# Patient Record
Sex: Female | Born: 1935 | Race: Black or African American | Hispanic: No | Marital: Married | Smoking: Former smoker
Health system: Southern US, Community
[De-identification: ages and names within clinical notes are randomized; demographics above are authoritative.]

## PROBLEM LIST (undated history)

## (undated) DIAGNOSIS — I428 Other cardiomyopathies: Principal | ICD-10-CM

## (undated) DIAGNOSIS — M1710 Unilateral primary osteoarthritis, unspecified knee: Secondary | ICD-10-CM

## (undated) DIAGNOSIS — C50919 Malignant neoplasm of unspecified site of unspecified female breast: Secondary | ICD-10-CM

## (undated) DIAGNOSIS — I513 Intracardiac thrombosis, not elsewhere classified: Secondary | ICD-10-CM

## (undated) DIAGNOSIS — F329 Major depressive disorder, single episode, unspecified: Secondary | ICD-10-CM

## (undated) DIAGNOSIS — I5020 Unspecified systolic (congestive) heart failure: Secondary | ICD-10-CM

## (undated) DIAGNOSIS — I4819 Other persistent atrial fibrillation: Secondary | ICD-10-CM

## (undated) DIAGNOSIS — Z9221 Personal history of antineoplastic chemotherapy: Secondary | ICD-10-CM

## (undated) DIAGNOSIS — D649 Anemia, unspecified: Secondary | ICD-10-CM

## (undated) DIAGNOSIS — C189 Malignant neoplasm of colon, unspecified: Secondary | ICD-10-CM

## (undated) DIAGNOSIS — Z950 Presence of cardiac pacemaker: Secondary | ICD-10-CM

## (undated) DIAGNOSIS — I10 Essential (primary) hypertension: Secondary | ICD-10-CM

## (undated) DIAGNOSIS — M179 Osteoarthritis of knee, unspecified: Secondary | ICD-10-CM

## (undated) DIAGNOSIS — I4891 Unspecified atrial fibrillation: Secondary | ICD-10-CM

## (undated) DIAGNOSIS — I38 Endocarditis, valve unspecified: Secondary | ICD-10-CM

## (undated) DIAGNOSIS — F32A Depression, unspecified: Secondary | ICD-10-CM

## (undated) DIAGNOSIS — IMO0002 Reserved for concepts with insufficient information to code with codable children: Secondary | ICD-10-CM

## (undated) DIAGNOSIS — Z8659 Personal history of other mental and behavioral disorders: Secondary | ICD-10-CM

## (undated) DIAGNOSIS — I82409 Acute embolism and thrombosis of unspecified deep veins of unspecified lower extremity: Secondary | ICD-10-CM

## (undated) DIAGNOSIS — J449 Chronic obstructive pulmonary disease, unspecified: Secondary | ICD-10-CM

## (undated) DIAGNOSIS — E785 Hyperlipidemia, unspecified: Secondary | ICD-10-CM

## (undated) HISTORY — DX: Malignant neoplasm of colon, unspecified: C18.9

## (undated) HISTORY — DX: Personal history of other mental and behavioral disorders: Z86.59

## (undated) HISTORY — DX: Major depressive disorder, single episode, unspecified: F32.9

## (undated) HISTORY — DX: Anemia, unspecified: D64.9

## (undated) HISTORY — DX: Endocarditis, valve unspecified: I38

## (undated) HISTORY — PX: ABDOMINAL HYSTERECTOMY: SHX81

## (undated) HISTORY — DX: Malignant neoplasm of unspecified site of unspecified female breast: C50.919

## (undated) HISTORY — DX: Other cardiomyopathies: I42.8

## (undated) HISTORY — PX: OOPHORECTOMY: SHX86

## (undated) HISTORY — DX: Unspecified systolic (congestive) heart failure: I50.20

## (undated) HISTORY — DX: Essential (primary) hypertension: I10

## (undated) HISTORY — DX: Reserved for concepts with insufficient information to code with codable children: IMO0002

## (undated) HISTORY — PX: REPLACEMENT TOTAL KNEE: SUR1224

## (undated) HISTORY — DX: Chronic obstructive pulmonary disease, unspecified: J44.9

## (undated) HISTORY — DX: Presence of cardiac pacemaker: Z95.0

## (undated) HISTORY — DX: Acute embolism and thrombosis of unspecified deep veins of unspecified lower extremity: I82.409

## (undated) HISTORY — DX: Unspecified atrial fibrillation: I48.91

## (undated) HISTORY — DX: Depression, unspecified: F32.A

## (undated) HISTORY — DX: Intracardiac thrombosis, not elsewhere classified: I51.3

## (undated) HISTORY — DX: Hyperlipidemia, unspecified: E78.5

## (undated) HISTORY — DX: Unilateral primary osteoarthritis, unspecified knee: M17.10

## (undated) HISTORY — DX: Osteoarthritis of knee, unspecified: M17.9

---

## 2003-11-28 ENCOUNTER — Ambulatory Visit: Payer: Self-pay | Admitting: Internal Medicine

## 2004-11-05 ENCOUNTER — Ambulatory Visit: Payer: Self-pay | Admitting: Family Medicine

## 2004-11-06 ENCOUNTER — Ambulatory Visit: Payer: Self-pay | Admitting: Family Medicine

## 2004-11-19 ENCOUNTER — Other Ambulatory Visit: Payer: Self-pay

## 2004-11-19 ENCOUNTER — Inpatient Hospital Stay: Payer: Self-pay | Admitting: Internal Medicine

## 2004-12-20 ENCOUNTER — Inpatient Hospital Stay: Payer: Self-pay | Admitting: Internal Medicine

## 2004-12-20 ENCOUNTER — Other Ambulatory Visit: Payer: Self-pay

## 2005-07-27 ENCOUNTER — Other Ambulatory Visit: Payer: Self-pay

## 2005-07-27 ENCOUNTER — Emergency Department: Payer: Self-pay | Admitting: Emergency Medicine

## 2005-10-20 ENCOUNTER — Emergency Department: Payer: Self-pay | Admitting: Emergency Medicine

## 2005-10-20 ENCOUNTER — Other Ambulatory Visit: Payer: Self-pay

## 2005-11-06 ENCOUNTER — Ambulatory Visit: Payer: Self-pay | Admitting: Internal Medicine

## 2005-11-11 ENCOUNTER — Ambulatory Visit: Payer: Self-pay | Admitting: Internal Medicine

## 2006-03-31 ENCOUNTER — Other Ambulatory Visit: Payer: Self-pay

## 2006-03-31 ENCOUNTER — Inpatient Hospital Stay: Payer: Self-pay | Admitting: Internal Medicine

## 2006-10-04 ENCOUNTER — Other Ambulatory Visit: Payer: Self-pay

## 2006-10-04 ENCOUNTER — Inpatient Hospital Stay: Payer: Self-pay | Admitting: Internal Medicine

## 2006-12-15 ENCOUNTER — Observation Stay: Payer: Self-pay | Admitting: Internal Medicine

## 2006-12-15 ENCOUNTER — Other Ambulatory Visit: Payer: Self-pay

## 2007-01-07 HISTORY — PX: PACEMAKER INSERTION: SHX728

## 2008-04-06 ENCOUNTER — Ambulatory Visit: Payer: Self-pay | Admitting: Internal Medicine

## 2008-04-25 ENCOUNTER — Ambulatory Visit: Payer: Self-pay | Admitting: Internal Medicine

## 2008-07-26 ENCOUNTER — Ambulatory Visit: Payer: Self-pay | Admitting: Gastroenterology

## 2008-07-31 ENCOUNTER — Ambulatory Visit: Payer: Self-pay | Admitting: Gastroenterology

## 2008-08-06 HISTORY — PX: LAPAROSCOPIC RIGHT COLON RESECTION: SHX1935

## 2008-08-18 ENCOUNTER — Ambulatory Visit: Payer: Self-pay | Admitting: Internal Medicine

## 2008-08-28 ENCOUNTER — Ambulatory Visit: Payer: Self-pay | Admitting: Surgery

## 2008-08-29 ENCOUNTER — Ambulatory Visit: Payer: Self-pay | Admitting: Surgery

## 2008-09-04 ENCOUNTER — Inpatient Hospital Stay: Payer: Self-pay | Admitting: Surgery

## 2008-10-20 ENCOUNTER — Ambulatory Visit: Payer: Self-pay | Admitting: Maternal and Fetal Medicine

## 2008-10-20 ENCOUNTER — Ambulatory Visit: Payer: Self-pay | Admitting: Oncology

## 2008-11-06 ENCOUNTER — Ambulatory Visit: Payer: Self-pay | Admitting: Maternal and Fetal Medicine

## 2008-11-06 ENCOUNTER — Ambulatory Visit: Payer: Self-pay | Admitting: Oncology

## 2009-04-06 ENCOUNTER — Ambulatory Visit: Payer: Self-pay | Admitting: Oncology

## 2009-05-04 ENCOUNTER — Ambulatory Visit: Payer: Self-pay | Admitting: Oncology

## 2009-05-06 ENCOUNTER — Ambulatory Visit: Payer: Self-pay | Admitting: Oncology

## 2009-06-04 ENCOUNTER — Ambulatory Visit: Payer: Self-pay | Admitting: Gastroenterology

## 2009-08-29 ENCOUNTER — Ambulatory Visit: Payer: Self-pay | Admitting: Internal Medicine

## 2011-03-24 ENCOUNTER — Encounter: Payer: Self-pay | Admitting: Internal Medicine

## 2011-03-24 ENCOUNTER — Ambulatory Visit (INDEPENDENT_AMBULATORY_CARE_PROVIDER_SITE_OTHER): Payer: Medicare Other | Admitting: Internal Medicine

## 2011-03-24 VITALS — BP 153/79 | HR 52 | Ht 65.0 in | Wt 168.0 lb

## 2011-03-24 DIAGNOSIS — I428 Other cardiomyopathies: Secondary | ICD-10-CM

## 2011-03-24 DIAGNOSIS — Z95810 Presence of automatic (implantable) cardiac defibrillator: Secondary | ICD-10-CM | POA: Insufficient documentation

## 2011-03-24 DIAGNOSIS — I509 Heart failure, unspecified: Secondary | ICD-10-CM

## 2011-03-24 DIAGNOSIS — I5023 Acute on chronic systolic (congestive) heart failure: Secondary | ICD-10-CM | POA: Insufficient documentation

## 2011-03-24 DIAGNOSIS — I5022 Chronic systolic (congestive) heart failure: Secondary | ICD-10-CM

## 2011-03-24 DIAGNOSIS — E785 Hyperlipidemia, unspecified: Secondary | ICD-10-CM

## 2011-03-24 LAB — ICD DEVICE OBSERVATION
DEVICE MODEL ICD: 241621
HV IMPEDENCE: 45 Ohm
RV LEAD AMPLITUDE: 13 mv
RV LEAD IMPEDENCE ICD: 590 Ohm
RV LEAD THRESHOLD: 0.5 V
TZON-0003FASTVT: 315.7 ms
TZON-0003SLOWVT: 375 ms
VENTRICULAR PACING ICD: 1 pct

## 2011-03-24 NOTE — Patient Instructions (Signed)
Follow up with Dr. Taylor in one year.  

## 2011-03-24 NOTE — Assessment & Plan Note (Signed)
Her device is working normally. Will plan to recheck in several months. 

## 2011-03-24 NOTE — Assessment & Plan Note (Signed)
Her symptoms are class 2. She will continue her current meds and maintain a low sodium diet. 

## 2011-03-24 NOTE — Progress Notes (Signed)
Patient ID: Julia Gordon, female   DOB: 20-Feb-1934, 77 y.o.   MRN: [id number] HPI Julia Gordon presents today to establish for ongoing management and treatment of her ICD in the setting of a non-ischemic cardiomyopathy. She has a longstanding non-ischemic cardiomyopathy and underwent ICD implant in 2009. She had previously been a patient of Dr. Glennis Brink. The patient has class 2 CHF symptoms. She denies c/p or peripheral edema. No syncope.   No Known Allergies   Current Outpatient Prescriptions  Medication Sig Dispense Refill  . aspirin 81 MG tablet Take 81 mg by mouth daily.      . furosemide (LASIX) 40 MG tablet Take one tablet by mouth every morning and for weight change as directed      . ipratropium-albuterol (DUONEB) 0.5-2.5 (3) MG/3ML SOLN Take by nebulization.      . isosorbide mononitrate (IMDUR) 30 MG 24 hr tablet Take 1 tablet by mouth Daily.      Marland Kitchen KLOR-CON M20 20 MEQ tablet Take 1 tablet by mouth Daily.      Marland Kitchen lisinopril (PRINIVIL,ZESTRIL) 10 MG tablet Take 1 tablet by mouth Daily.      Marland Kitchen lovastatin (MEVACOR) 40 MG tablet Take 2 tablets by mouth Daily.      . metoprolol (TOPROL-XL) 200 MG 24 hr tablet Take 1 tablet by mouth Daily.      . MULTIPLE VITAMIN PO Take by mouth daily.      Marland Kitchen spironolactone (ALDACTONE) 25 MG tablet Take 1 tablet by mouth Daily.         Past Medical History  Diagnosis Date  . Pacemaker 05/2007    [employer] ICD, Guidant lead, [facility] Dr Francena Hanly  . HTN (hypertension)   . Other primary cardiomyopathies   . Atrial fibrillation     history of  . LV (left ventricular) mural thrombus     history of, resolved (echo 04/09)  . HLD (hyperlipidemia)   . COPD (chronic obstructive pulmonary disease)   . Colon adenocarcinoma   . History of depression   . DJD (degenerative joint disease) of knee     right knee  . H/O hysterectomy with oophorectomy     for DUB    ROS:   All systems reviewed and negative except as noted in the HPI.   Past Surgical  History  Procedure Date  . Pacemaker insertion 2009  . Replacement total knee 1990's    right   . Laparoscopic right colon resection 08/10    Dr Michela Pitcher, for adenoca      Family History  Problem Relation Age of Onset  . Diabetes      brothers  . Hypertension Father   . Alzheimer's disease Mother   . Stroke Brother   . Heart failure Brother   . Breast cancer Sister   . Lupus Sister      History   Social History  . Marital Status: Married    Spouse Name: N/A    Number of Children: N/A  . Years of Education: N/A   Occupational History  . 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 Smoking status: Former Games developer  . Smokeless tobacco: Never Used   Comment: quit 2006  . Alcohol Use: No     former  . Drug Use: No  . Sexually Active: Not on file   Other Topics Concern  . Not on file   Social History Narrative  .  No narrative on file     BP 153/79  Pulse 52  Ht 5\' 5"  (1.651 m)  Wt 76.204 kg (168 lb)  BMI 27.96 kg/m2  Physical Exam:  Well appearing NAD HEENT: Unremarkable Neck:  No JVD, no thyromegally Lymphatics:  No adenopathy Back:  No CVA tenderness Lungs:  Clear HEART:  Regular rate rhythm, no murmurs, no rubs, no clicks Abd:  soft, positive bowel sounds, no organomegally, no rebound, no guarding Ext:  2 plus pulses, no edema, no cyanosis, no clubbing Skin:  No rashes no nodules Neuro:  CN II through XII intact, motor grossly intact  DEVICE  Normal device function.  See PaceArt for details.   Assess/Plan:

## 2011-05-02 ENCOUNTER — Encounter: Payer: Self-pay | Admitting: Internal Medicine

## 2011-06-26 ENCOUNTER — Encounter: Payer: BC Managed Care – PPO | Admitting: *Deleted

## 2011-07-01 ENCOUNTER — Encounter: Payer: Self-pay | Admitting: *Deleted

## 2011-09-03 ENCOUNTER — Encounter: Payer: Self-pay | Admitting: *Deleted

## 2012-01-01 ENCOUNTER — Encounter: Payer: Self-pay | Admitting: *Deleted

## 2012-01-09 ENCOUNTER — Encounter: Payer: Self-pay | Admitting: *Deleted

## 2012-01-21 ENCOUNTER — Ambulatory Visit: Payer: Self-pay | Admitting: Gastroenterology

## 2012-04-15 ENCOUNTER — Encounter: Payer: Self-pay | Admitting: Internal Medicine

## 2012-04-15 ENCOUNTER — Ambulatory Visit (INDEPENDENT_AMBULATORY_CARE_PROVIDER_SITE_OTHER): Payer: Medicare Other | Admitting: Internal Medicine

## 2012-04-15 VITALS — BP 118/78 | HR 44 | Ht 62.0 in | Wt 159.0 lb

## 2012-04-15 DIAGNOSIS — I428 Other cardiomyopathies: Secondary | ICD-10-CM

## 2012-04-15 DIAGNOSIS — Z95810 Presence of automatic (implantable) cardiac defibrillator: Secondary | ICD-10-CM

## 2012-04-15 DIAGNOSIS — I5022 Chronic systolic (congestive) heart failure: Secondary | ICD-10-CM

## 2012-04-15 DIAGNOSIS — I509 Heart failure, unspecified: Secondary | ICD-10-CM

## 2012-04-15 LAB — ICD DEVICE OBSERVATION
BRDY-0002RV: 40 {beats}/min
DEVICE MODEL ICD: 241621
RV LEAD AMPLITUDE: 11.7 mv
RV LEAD THRESHOLD: 0.5 V
TZON-0003FASTVT: 315.7 ms
VENTRICULAR PACING ICD: 3 pct

## 2012-04-15 NOTE — Assessment & Plan Note (Signed)
The patient has symptoms consistent with a low output state. This has been exacerbated by her bradycardia, secondary to extensive beta blocker therapy. I've recommended that the patient to reduce her dose of metoprolol from 200 mg daily down to 100 mg daily. I will see her back in several months. Ultimately, she may require atrial pacing. She will continue her other medications for the treatment of chronic systolic heart failure.

## 2012-04-15 NOTE — Patient Instructions (Addendum)
Your physician wants you to follow-up in: 3 months with Dr. Ladona Ridgel. You will receive a reminder letter in the mail two months in advance. If you don't receive a letter, please call our office to schedule the follow-up appointment.  Decrease Toprol from 200 mg daily to 100 mg daily

## 2012-04-15 NOTE — Assessment & Plan Note (Signed)
Her [employer] ICD is working normally. We'll plan to recheck in several months. 

## 2012-04-15 NOTE — Progress Notes (Signed)
HPI Julia Gordon returns today for followup. She is a very pleasant 78 year old woman with a nonischemic cardiomyopathy, and chronic systolic heart failure, status post ICD insertion in 2009. In the interim, she has had fatigue and weakness. She denies chest pain, ICD discharges, or syncope. She has occasional dizziness. Her energy level is reduced. No Known Allergies   Current Outpatient Prescriptions  Medication Sig Dispense Refill  . aspirin 81 MG tablet Take 81 mg by mouth daily.      . furosemide (LASIX) 40 MG tablet Take one tablet by mouth every morning and for weight change as directed      . ipratropium-albuterol (DUONEB) 0.5-2.5 (3) MG/3ML SOLN Take by nebulization.      . isosorbide mononitrate (IMDUR) 30 MG 24 hr tablet Take 1 tablet by mouth Daily.      Marland Kitchen KLOR-CON M20 20 MEQ tablet Take 1 tablet by mouth Daily.      Marland Kitchen lisinopril (PRINIVIL,ZESTRIL) 10 MG tablet Take 1 tablet by mouth Daily.      Marland Kitchen lovastatin (MEVACOR) 40 MG tablet Take 2 tablets by mouth Daily.      . metoprolol (TOPROL-XL) 200 MG 24 hr tablet Take 1 tablet by mouth Daily.      . MULTIPLE VITAMIN PO Take by mouth daily.      Marland Kitchen spironolactone (ALDACTONE) 25 MG tablet Take 1 tablet by mouth Daily.       No current facility-administered medications for this visit.     Past Medical History  Diagnosis Date  . Pacemaker 05/2007    [employer] ICD, Guidant lead, [facility] Dr Francena Hanly  . HTN (hypertension)   . Other primary cardiomyopathies   . Atrial fibrillation     history of  . LV (left ventricular) mural thrombus     history of, resolved (echo 04/09)  . HLD (hyperlipidemia)   . COPD (chronic obstructive pulmonary disease)   . Colon adenocarcinoma   . History of depression   . DJD (degenerative joint disease) of knee     right knee  . H/O hysterectomy with oophorectomy     for DUB    ROS:   All systems reviewed and negative except as noted in the HPI.   Past Surgical History  Procedure  Laterality Date  . Pacemaker insertion  2009  . Replacement total knee  1990's    right   . Laparoscopic right colon resection  08/10    Dr Michela Pitcher, for adenoca      Family History  Problem Relation Age of Onset  . Diabetes      brothers  . Hypertension Father   . Alzheimer's disease Mother   . Stroke Brother   . Heart failure Brother   . Breast cancer Sister   . Lupus Sister      History   Social History  . Marital Status: Married    Spouse Name: N/A    Number of Children: N/A  . Years of Education: N/A   Occupational History  . 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 Smoking status: Former Games developer  . Smokeless tobacco: Never Used     Comment: quit 2006  . Alcohol Use: No     Comment: former  . Drug Use: No  . Sexually Active: Not on file   Other Topics Concern  . Not on file   Social History Narrative  . No narrative on file  BP 118/78  Pulse 44  Ht 5\' 2"  (1.575 m)  Wt 159 lb (72.122 kg)  BMI 29.07 kg/m2  Physical Exam:  Well appearing 78 year old woman,NAD HEENT: Unremarkable Neck:  7 cm JVD, no thyromegally Back:  No CVA tenderness Lungs:  Clear with no wheezes, rales, or rhonchi. HEART:  Regular rate rhythm, no murmurs, no rubs, no clicks Abd:  soft, positive bowel sounds, no organomegally, no rebound, no guarding Ext:  2 plus pulses, no edema, no cyanosis, no clubbing Skin:  No rashes no nodules Neuro:  CN II through XII intact, motor grossly intact  EKG - marked sinus bradycardia with intermittent ventricular pacing.  DEVICE  Normal device function.  See PaceArt for details.   Assess/Plan:

## 2013-01-06 DIAGNOSIS — C50919 Malignant neoplasm of unspecified site of unspecified female breast: Secondary | ICD-10-CM

## 2013-01-06 HISTORY — DX: Malignant neoplasm of unspecified site of unspecified female breast: C50.919

## 2013-01-06 HISTORY — PX: MASTECTOMY: SHX3

## 2013-05-03 ENCOUNTER — Ambulatory Visit: Payer: Self-pay | Admitting: Primary Care

## 2013-05-10 ENCOUNTER — Ambulatory Visit: Payer: Self-pay | Admitting: Primary Care

## 2013-05-10 HISTORY — PX: BREAST BIOPSY: SHX20

## 2013-05-11 ENCOUNTER — Ambulatory Visit: Payer: Self-pay | Admitting: Oncology

## 2013-05-13 LAB — PATHOLOGY REPORT

## 2013-05-17 ENCOUNTER — Ambulatory Visit: Payer: Self-pay | Admitting: Oncology

## 2013-05-17 LAB — COMPREHENSIVE METABOLIC PANEL
ALK PHOS: 46 U/L
ANION GAP: 7 (ref 7–16)
Albumin: 3.5 g/dL (ref 3.4–5.0)
BUN: 10 mg/dL (ref 7–18)
Bilirubin,Total: 0.3 mg/dL (ref 0.2–1.0)
CREATININE: 1.02 mg/dL (ref 0.60–1.30)
Calcium, Total: 9.4 mg/dL (ref 8.5–10.1)
Chloride: 103 mmol/L (ref 98–107)
Co2: 29 mmol/L (ref 21–32)
EGFR (African American): >60
EGFR (Non-African Amer.): 53 — ABNORMAL LOW
Glucose: 86 mg/dL (ref 65–99)
Osmolality: 276 (ref 275–301)
POTASSIUM: 3.8 mmol/L (ref 3.5–5.1)
SGOT(AST): 17 U/L (ref 15–37)
SGPT (ALT): 13 U/L (ref 12–78)
Sodium: 139 mmol/L (ref 136–145)
Total Protein: 7.4 g/dL (ref 6.4–8.2)

## 2013-05-17 LAB — CBC CANCER CENTER
Basophil #: 0.1 x10 3/mm (ref 0.0–0.1)
Basophil %: 1 %
Eosinophil #: 0 x10 3/mm (ref 0.0–0.7)
Eosinophil %: 0.8 %
HCT: 39.6 % (ref 35.0–47.0)
HGB: 13.5 g/dL (ref 12.0–16.0)
Lymphocyte #: 1.6 x10 3/mm (ref 1.0–3.6)
Lymphocyte %: 29.6 %
MCH: 33.1 pg (ref 26.0–34.0)
MCHC: 34.1 g/dL (ref 32.0–36.0)
MCV: 97 fL (ref 80–100)
MONO ABS: 0.5 x10 3/mm (ref 0.2–0.9)
Monocyte %: 8.4 %
NEUTROS ABS: 3.3 x10 3/mm (ref 1.4–6.5)
Neutrophil %: 60.2 %
PLATELETS: 154 x10 3/mm (ref 150–440)
RBC: 4.07 10*6/uL (ref 3.80–5.20)
RDW: 13.9 % (ref 11.5–14.5)
WBC: 5.5 x10 3/mm (ref 3.6–11.0)

## 2013-05-18 LAB — CANCER ANTIGEN 27.29: CA 27.29: 23.2 U/mL (ref 0.0–38.6)

## 2013-06-06 ENCOUNTER — Ambulatory Visit: Payer: Self-pay | Admitting: Oncology

## 2013-06-07 LAB — COMPREHENSIVE METABOLIC PANEL
ALBUMIN: 3.6 g/dL (ref 3.4–5.0)
ALK PHOS: 43 U/L — AB
ALT: 16 U/L (ref 12–78)
Anion Gap: 5 — ABNORMAL LOW (ref 7–16)
BILIRUBIN TOTAL: 0.5 mg/dL (ref 0.2–1.0)
BUN: 17 mg/dL (ref 7–18)
CALCIUM: 9.7 mg/dL (ref 8.5–10.1)
CHLORIDE: 102 mmol/L (ref 98–107)
Co2: 32 mmol/L (ref 21–32)
Creatinine: 1.1 mg/dL (ref 0.60–1.30)
EGFR (African American): 56 — ABNORMAL LOW
EGFR (Non-African Amer.): 49 — ABNORMAL LOW
Glucose: 129 mg/dL — ABNORMAL HIGH (ref 65–99)
Osmolality: 281 (ref 275–301)
Potassium: 4.4 mmol/L (ref 3.5–5.1)
SGOT(AST): 16 U/L (ref 15–37)
Sodium: 139 mmol/L (ref 136–145)
Total Protein: 7.5 g/dL (ref 6.4–8.2)

## 2013-06-07 LAB — CBC CANCER CENTER
BASOS ABS: 0.1 x10 3/mm (ref 0.0–0.1)
Basophil %: 1.3 %
EOS PCT: 1 %
Eosinophil #: 0.1 x10 3/mm (ref 0.0–0.7)
HCT: 40.3 % (ref 35.0–47.0)
HGB: 13.8 g/dL (ref 12.0–16.0)
LYMPHS ABS: 1.6 x10 3/mm (ref 1.0–3.6)
Lymphocyte %: 30.7 %
MCH: 33.4 pg (ref 26.0–34.0)
MCHC: 34.2 g/dL (ref 32.0–36.0)
MCV: 98 fL (ref 80–100)
MONO ABS: 0.5 x10 3/mm (ref 0.2–0.9)
MONOS PCT: 9.5 %
NEUTROS ABS: 3 x10 3/mm (ref 1.4–6.5)
NEUTROS PCT: 57.5 %
PLATELETS: 151 x10 3/mm (ref 150–440)
RBC: 4.11 10*6/uL (ref 3.80–5.20)
RDW: 14.1 % (ref 11.5–14.5)
WBC: 5.2 x10 3/mm (ref 3.6–11.0)

## 2013-06-14 LAB — CBC CANCER CENTER
BASOS ABS: 0 x10 3/mm (ref 0.0–0.1)
Basophil %: 0.1 %
Eosinophil #: 0 x10 3/mm (ref 0.0–0.7)
Eosinophil %: 1 %
HCT: 37.5 % (ref 35.0–47.0)
HGB: 12.7 g/dL (ref 12.0–16.0)
Lymphocyte #: 0.6 x10 3/mm — ABNORMAL LOW (ref 1.0–3.6)
Lymphocyte %: 59.7 %
MCH: 32.6 pg (ref 26.0–34.0)
MCHC: 33.8 g/dL (ref 32.0–36.0)
MCV: 97 fL (ref 80–100)
MONOS PCT: 4.8 %
Monocyte #: 0 x10 3/mm — ABNORMAL LOW (ref 0.2–0.9)
Neutrophil #: 0.3 x10 3/mm — ABNORMAL LOW (ref 1.4–6.5)
Neutrophil %: 34.4 %
Platelet: 118 x10 3/mm — ABNORMAL LOW (ref 150–440)
RBC: 3.88 10*6/uL (ref 3.80–5.20)
RDW: 13.6 % (ref 11.5–14.5)
WBC: 1 x10 3/mm — CL (ref 3.6–11.0)

## 2013-06-14 LAB — COMPREHENSIVE METABOLIC PANEL
ALBUMIN: 3.4 g/dL (ref 3.4–5.0)
ALK PHOS: 38 U/L — AB
AST: 16 U/L (ref 15–37)
Anion Gap: 7 (ref 7–16)
BUN: 24 mg/dL — AB (ref 7–18)
Bilirubin,Total: 0.7 mg/dL (ref 0.2–1.0)
CHLORIDE: 101 mmol/L (ref 98–107)
Calcium, Total: 9 mg/dL (ref 8.5–10.1)
Co2: 29 mmol/L (ref 21–32)
Creatinine: 1.02 mg/dL (ref 0.60–1.30)
EGFR (Non-African Amer.): 53 — ABNORMAL LOW
Glucose: 107 mg/dL — ABNORMAL HIGH (ref 65–99)
Osmolality: 278 (ref 275–301)
POTASSIUM: 3.8 mmol/L (ref 3.5–5.1)
SGPT (ALT): 18 U/L (ref 12–78)
Sodium: 137 mmol/L (ref 136–145)
Total Protein: 6.9 g/dL (ref 6.4–8.2)

## 2013-06-21 ENCOUNTER — Ambulatory Visit: Payer: Self-pay | Admitting: Vascular Surgery

## 2013-06-28 LAB — CBC CANCER CENTER
BASOS ABS: 0.1 x10 3/mm (ref 0.0–0.1)
Basophil %: 1 %
Eosinophil #: 0 x10 3/mm (ref 0.0–0.7)
Eosinophil %: 0.1 %
HCT: 33.4 % — AB (ref 35.0–47.0)
HGB: 11.5 g/dL — ABNORMAL LOW (ref 12.0–16.0)
LYMPHS ABS: 1.3 x10 3/mm (ref 1.0–3.6)
Lymphocyte %: 21.4 %
MCH: 32.9 pg (ref 26.0–34.0)
MCHC: 34.4 g/dL (ref 32.0–36.0)
MCV: 96 fL (ref 80–100)
Monocyte #: 0.6 x10 3/mm (ref 0.2–0.9)
Monocyte %: 9.9 %
NEUTROS PCT: 67.6 %
Neutrophil #: 4.1 x10 3/mm (ref 1.4–6.5)
Platelet: 138 x10 3/mm — ABNORMAL LOW (ref 150–440)
RBC: 3.5 10*6/uL — ABNORMAL LOW (ref 3.80–5.20)
RDW: 13.9 % (ref 11.5–14.5)
WBC: 6 x10 3/mm (ref 3.6–11.0)

## 2013-06-28 LAB — COMPREHENSIVE METABOLIC PANEL
ALK PHOS: 36 U/L — AB
ALT: 18 U/L (ref 12–78)
ANION GAP: 3 — AB (ref 7–16)
AST: 20 U/L (ref 15–37)
Albumin: 3.1 g/dL — ABNORMAL LOW (ref 3.4–5.0)
BUN: 15 mg/dL (ref 7–18)
Bilirubin,Total: 0.3 mg/dL (ref 0.2–1.0)
CO2: 30 mmol/L (ref 21–32)
CREATININE: 0.99 mg/dL (ref 0.60–1.30)
Calcium, Total: 9.1 mg/dL (ref 8.5–10.1)
Chloride: 105 mmol/L (ref 98–107)
EGFR (African American): >60
EGFR (Non-African Amer.): 55 — ABNORMAL LOW
Glucose: 106 mg/dL — ABNORMAL HIGH (ref 65–99)
Osmolality: 277 (ref 275–301)
POTASSIUM: 4 mmol/L (ref 3.5–5.1)
Sodium: 138 mmol/L (ref 136–145)
Total Protein: 6.5 g/dL (ref 6.4–8.2)

## 2013-07-05 LAB — COMPREHENSIVE METABOLIC PANEL
ALBUMIN: 3.2 g/dL — AB (ref 3.4–5.0)
ALT: 20 U/L (ref 12–78)
AST: 20 U/L (ref 15–37)
Alkaline Phosphatase: 38 U/L — ABNORMAL LOW
Anion Gap: 6 — ABNORMAL LOW (ref 7–16)
BILIRUBIN TOTAL: 0.6 mg/dL (ref 0.2–1.0)
BUN: 23 mg/dL — ABNORMAL HIGH (ref 7–18)
CHLORIDE: 103 mmol/L (ref 98–107)
Calcium, Total: 9 mg/dL (ref 8.5–10.1)
Co2: 30 mmol/L (ref 21–32)
Creatinine: 1.09 mg/dL (ref 0.60–1.30)
EGFR (African American): 57 — ABNORMAL LOW
GFR CALC NON AF AMER: 49 — AB
Glucose: 99 mg/dL (ref 65–99)
Osmolality: 281 (ref 275–301)
Potassium: 4.5 mmol/L (ref 3.5–5.1)
Sodium: 139 mmol/L (ref 136–145)
TOTAL PROTEIN: 6.8 g/dL (ref 6.4–8.2)

## 2013-07-05 LAB — CBC CANCER CENTER
BASOS ABS: 0.1 x10 3/mm (ref 0.0–0.1)
BASOS PCT: 3.3 %
EOS PCT: 1 %
Eosinophil #: 0 x10 3/mm (ref 0.0–0.7)
HCT: 31.5 % — ABNORMAL LOW (ref 35.0–47.0)
HGB: 10.9 g/dL — AB (ref 12.0–16.0)
Lymphocyte #: 0.8 x10 3/mm — ABNORMAL LOW (ref 1.0–3.6)
Lymphocyte %: 49.6 %
MCH: 33 pg (ref 26.0–34.0)
MCHC: 34.7 g/dL (ref 32.0–36.0)
MCV: 95 fL (ref 80–100)
Monocyte #: 0.1 x10 3/mm — ABNORMAL LOW (ref 0.2–0.9)
Monocyte %: 5.6 %
NEUTROS ABS: 0.6 x10 3/mm — AB (ref 1.4–6.5)
NEUTROS PCT: 40.5 %
Platelet: 144 x10 3/mm — ABNORMAL LOW (ref 150–440)
RBC: 3.32 10*6/uL — AB (ref 3.80–5.20)
RDW: 14.1 % (ref 11.5–14.5)
WBC: 1.6 x10 3/mm — CL (ref 3.6–11.0)

## 2013-07-06 ENCOUNTER — Ambulatory Visit: Payer: Self-pay | Admitting: Oncology

## 2013-07-13 ENCOUNTER — Encounter: Payer: Self-pay | Admitting: *Deleted

## 2013-07-19 LAB — CBC CANCER CENTER
BASOS PCT: 0.5 %
Basophil #: 0 x10 3/mm (ref 0.0–0.1)
EOS ABS: 0 x10 3/mm (ref 0.0–0.7)
EOS PCT: 0.1 %
HCT: 30.2 % — AB (ref 35.0–47.0)
HGB: 10.1 g/dL — ABNORMAL LOW (ref 12.0–16.0)
LYMPHS ABS: 0.9 x10 3/mm — AB (ref 1.0–3.6)
Lymphocyte %: 11.8 %
MCH: 31.9 pg (ref 26.0–34.0)
MCHC: 33.4 g/dL (ref 32.0–36.0)
MCV: 96 fL (ref 80–100)
Monocyte #: 0.6 x10 3/mm (ref 0.2–0.9)
Monocyte %: 7.4 %
NEUTROS PCT: 80.2 %
Neutrophil #: 6.4 x10 3/mm (ref 1.4–6.5)
PLATELETS: 163 x10 3/mm (ref 150–440)
RBC: 3.16 10*6/uL — ABNORMAL LOW (ref 3.80–5.20)
RDW: 15.4 % — AB (ref 11.5–14.5)
WBC: 8 x10 3/mm (ref 3.6–11.0)

## 2013-07-19 LAB — COMPREHENSIVE METABOLIC PANEL
ALBUMIN: 3.2 g/dL — AB (ref 3.4–5.0)
ALT: 16 U/L (ref 12–78)
ANION GAP: 9 (ref 7–16)
AST: 19 U/L (ref 15–37)
Alkaline Phosphatase: 42 U/L — ABNORMAL LOW
BILIRUBIN TOTAL: 0.4 mg/dL (ref 0.2–1.0)
BUN: 16 mg/dL (ref 7–18)
CHLORIDE: 106 mmol/L (ref 98–107)
Calcium, Total: 8.7 mg/dL (ref 8.5–10.1)
Co2: 27 mmol/L (ref 21–32)
Creatinine: 1.05 mg/dL (ref 0.60–1.30)
EGFR (Non-African Amer.): 52 — ABNORMAL LOW
GFR CALC AF AMER: 60 — AB
Glucose: 115 mg/dL — ABNORMAL HIGH (ref 65–99)
OSMOLALITY: 285 (ref 275–301)
Potassium: 4 mmol/L (ref 3.5–5.1)
SODIUM: 142 mmol/L (ref 136–145)
TOTAL PROTEIN: 7 g/dL (ref 6.4–8.2)

## 2013-07-26 LAB — COMPREHENSIVE METABOLIC PANEL
ALBUMIN: 3 g/dL — AB (ref 3.4–5.0)
Alkaline Phosphatase: 42 U/L — ABNORMAL LOW
Anion Gap: 7 (ref 7–16)
BILIRUBIN TOTAL: 0.6 mg/dL (ref 0.2–1.0)
BUN: 16 mg/dL (ref 7–18)
CHLORIDE: 101 mmol/L (ref 98–107)
CREATININE: 0.98 mg/dL (ref 0.60–1.30)
Calcium, Total: 8.6 mg/dL (ref 8.5–10.1)
Co2: 28 mmol/L (ref 21–32)
EGFR (Non-African Amer.): 56 — ABNORMAL LOW
Glucose: 98 mg/dL (ref 65–99)
OSMOLALITY: 273 (ref 275–301)
Potassium: 3.9 mmol/L (ref 3.5–5.1)
SGOT(AST): 18 U/L (ref 15–37)
SGPT (ALT): 13 U/L (ref 12–78)
SODIUM: 136 mmol/L (ref 136–145)
TOTAL PROTEIN: 6.7 g/dL (ref 6.4–8.2)

## 2013-07-26 LAB — CBC CANCER CENTER
Basophil #: 0 x10 3/mm (ref 0.0–0.1)
Basophil %: 0.4 %
Eosinophil #: 0 x10 3/mm (ref 0.0–0.7)
Eosinophil %: 0.6 %
HCT: 26.2 % — AB (ref 35.0–47.0)
HGB: 8.9 g/dL — AB (ref 12.0–16.0)
LYMPHS ABS: 0.4 x10 3/mm — AB (ref 1.0–3.6)
LYMPHS PCT: 43.9 %
MCH: 31.8 pg (ref 26.0–34.0)
MCHC: 33.8 g/dL (ref 32.0–36.0)
MCV: 94 fL (ref 80–100)
MONOS PCT: 10.4 %
Monocyte #: 0.1 x10 3/mm — ABNORMAL LOW (ref 0.2–0.9)
NEUTROS PCT: 44.7 %
Neutrophil #: 0.4 x10 3/mm — ABNORMAL LOW (ref 1.4–6.5)
PLATELETS: 139 x10 3/mm — AB (ref 150–440)
RBC: 2.78 10*6/uL — AB (ref 3.80–5.20)
RDW: 15.6 % — ABNORMAL HIGH (ref 11.5–14.5)
WBC: 0.9 x10 3/mm — CL (ref 3.6–11.0)

## 2013-08-06 ENCOUNTER — Ambulatory Visit: Payer: Self-pay | Admitting: Oncology

## 2013-08-09 LAB — CBC CANCER CENTER
Basophil #: 0 x10 3/mm (ref 0.0–0.1)
Basophil %: 0.7 %
EOS PCT: 0.1 %
Eosinophil #: 0 x10 3/mm (ref 0.0–0.7)
HCT: 28.3 % — ABNORMAL LOW (ref 35.0–47.0)
HGB: 9.4 g/dL — ABNORMAL LOW (ref 12.0–16.0)
LYMPHS ABS: 0.8 x10 3/mm — AB (ref 1.0–3.6)
LYMPHS PCT: 10.6 %
MCH: 31.6 pg (ref 26.0–34.0)
MCHC: 33.4 g/dL (ref 32.0–36.0)
MCV: 95 fL (ref 80–100)
Monocyte #: 0.8 x10 3/mm (ref 0.2–0.9)
Monocyte %: 10.3 %
NEUTROS PCT: 78.3 %
Neutrophil #: 5.9 x10 3/mm (ref 1.4–6.5)
Platelet: 217 x10 3/mm (ref 150–440)
RBC: 2.98 10*6/uL — AB (ref 3.80–5.20)
RDW: 17.3 % — AB (ref 11.5–14.5)
WBC: 7.5 x10 3/mm (ref 3.6–11.0)

## 2013-08-09 LAB — COMPREHENSIVE METABOLIC PANEL
ALT: 15 U/L
Albumin: 3 g/dL — ABNORMAL LOW (ref 3.4–5.0)
Alkaline Phosphatase: 40 U/L — ABNORMAL LOW
Anion Gap: 6 — ABNORMAL LOW (ref 7–16)
BUN: 20 mg/dL — AB (ref 7–18)
Bilirubin,Total: 0.3 mg/dL (ref 0.2–1.0)
CALCIUM: 8.7 mg/dL (ref 8.5–10.1)
CREATININE: 1.18 mg/dL (ref 0.60–1.30)
Chloride: 107 mmol/L (ref 98–107)
Co2: 28 mmol/L (ref 21–32)
EGFR (Non-African Amer.): 44 — ABNORMAL LOW
GFR CALC AF AMER: 52 — AB
Glucose: 79 mg/dL (ref 65–99)
Osmolality: 283 (ref 275–301)
Potassium: 4.1 mmol/L (ref 3.5–5.1)
SGOT(AST): 19 U/L (ref 15–37)
Sodium: 141 mmol/L (ref 136–145)
TOTAL PROTEIN: 6.8 g/dL (ref 6.4–8.2)

## 2013-09-06 ENCOUNTER — Ambulatory Visit: Payer: Self-pay | Admitting: Oncology

## 2013-09-27 ENCOUNTER — Ambulatory Visit: Payer: Self-pay | Admitting: Surgery

## 2013-09-27 LAB — BASIC METABOLIC PANEL
Anion Gap: 3 — ABNORMAL LOW (ref 7–16)
BUN: 17 mg/dL (ref 7–18)
CO2: 28 mmol/L (ref 21–32)
Calcium, Total: 8.8 mg/dL (ref 8.5–10.1)
Chloride: 110 mmol/L — ABNORMAL HIGH (ref 98–107)
Creatinine: 0.95 mg/dL (ref 0.60–1.30)
EGFR (African American): >60
EGFR (Non-African Amer.): 58 — ABNORMAL LOW
Glucose: 97 mg/dL (ref 65–99)
Osmolality: 283 (ref 275–301)
Potassium: 4.2 mmol/L (ref 3.5–5.1)
SODIUM: 141 mmol/L (ref 136–145)

## 2013-09-27 LAB — CBC WITH DIFFERENTIAL/PLATELET
Basophil #: 0.1 10*3/uL (ref 0.0–0.1)
Basophil %: 1 %
EOS PCT: 3.9 %
Eosinophil #: 0.2 10*3/uL (ref 0.0–0.7)
HCT: 35.9 % (ref 35.0–47.0)
HGB: 11.7 g/dL — AB (ref 12.0–16.0)
LYMPHS ABS: 1 10*3/uL (ref 1.0–3.6)
Lymphocyte %: 19.6 %
MCH: 31.4 pg (ref 26.0–34.0)
MCHC: 32.5 g/dL (ref 32.0–36.0)
MCV: 97 fL (ref 80–100)
MONO ABS: 0.5 x10 3/mm (ref 0.2–0.9)
Monocyte %: 8.9 %
NEUTROS ABS: 3.6 10*3/uL (ref 1.4–6.5)
Neutrophil %: 66.6 %
Platelet: 166 10*3/uL (ref 150–440)
RBC: 3.72 10*6/uL — AB (ref 3.80–5.20)
RDW: 18.7 % — ABNORMAL HIGH (ref 11.5–14.5)
WBC: 5.3 10*3/uL (ref 3.6–11.0)

## 2013-10-04 ENCOUNTER — Ambulatory Visit: Payer: Self-pay | Admitting: Surgery

## 2013-10-06 ENCOUNTER — Ambulatory Visit: Payer: Self-pay | Admitting: Oncology

## 2013-10-06 LAB — CBC CANCER CENTER
BASOS PCT: 1.2 %
Basophil #: 0.1 x10 3/mm (ref 0.0–0.1)
EOS ABS: 0.2 x10 3/mm (ref 0.0–0.7)
Eosinophil %: 3.6 %
HCT: 36.6 % (ref 35.0–47.0)
HGB: 12 g/dL (ref 12.0–16.0)
LYMPHS PCT: 22 %
Lymphocyte #: 1.1 x10 3/mm (ref 1.0–3.6)
MCH: 31.4 pg (ref 26.0–34.0)
MCHC: 32.8 g/dL (ref 32.0–36.0)
MCV: 96 fL (ref 80–100)
MONO ABS: 0.5 x10 3/mm (ref 0.2–0.9)
Monocyte %: 10.6 %
NEUTROS ABS: 3 x10 3/mm (ref 1.4–6.5)
Neutrophil %: 62.6 %
Platelet: 164 x10 3/mm (ref 150–440)
RBC: 3.83 10*6/uL (ref 3.80–5.20)
RDW: 18.5 % — ABNORMAL HIGH (ref 11.5–14.5)
WBC: 4.8 x10 3/mm (ref 3.6–11.0)

## 2013-10-06 LAB — COMPREHENSIVE METABOLIC PANEL
ALK PHOS: 55 U/L
ALT: 15 U/L
AST: 23 U/L (ref 15–37)
Albumin: 3.7 g/dL (ref 3.4–5.0)
Anion Gap: 4 — ABNORMAL LOW (ref 7–16)
BUN: 15 mg/dL (ref 7–18)
Bilirubin,Total: 0.4 mg/dL (ref 0.2–1.0)
CHLORIDE: 106 mmol/L (ref 98–107)
Calcium, Total: 9.1 mg/dL (ref 8.5–10.1)
Co2: 31 mmol/L (ref 21–32)
Creatinine: 1.15 mg/dL (ref 0.60–1.30)
EGFR (African American): 59 — ABNORMAL LOW
GFR CALC NON AF AMER: 49 — AB
Glucose: 88 mg/dL (ref 65–99)
OSMOLALITY: 282 (ref 275–301)
Potassium: 4.4 mmol/L (ref 3.5–5.1)
Sodium: 141 mmol/L (ref 136–145)
Total Protein: 7.5 g/dL (ref 6.4–8.2)

## 2013-10-07 LAB — PATHOLOGY REPORT

## 2013-11-06 ENCOUNTER — Ambulatory Visit: Payer: Self-pay | Admitting: Oncology

## 2013-12-08 ENCOUNTER — Ambulatory Visit: Payer: Self-pay | Admitting: Oncology

## 2013-12-13 ENCOUNTER — Ambulatory Visit: Payer: Self-pay | Admitting: Surgery

## 2013-12-13 LAB — BASIC METABOLIC PANEL
Anion Gap: 4 — ABNORMAL LOW (ref 7–16)
BUN: 16 mg/dL (ref 7–18)
CO2: 28 mmol/L (ref 21–32)
Calcium, Total: 9.7 mg/dL (ref 8.5–10.1)
Chloride: 104 mmol/L (ref 98–107)
Creatinine: 1.01 mg/dL (ref 0.60–1.30)
GFR CALC NON AF AMER: 56 — AB
Glucose: 100 mg/dL — ABNORMAL HIGH (ref 65–99)
OSMOLALITY: 273 (ref 275–301)
POTASSIUM: 3.8 mmol/L (ref 3.5–5.1)
SODIUM: 136 mmol/L (ref 136–145)

## 2013-12-13 LAB — CBC WITH DIFFERENTIAL/PLATELET
Basophil #: 0.1 10*3/uL (ref 0.0–0.1)
Basophil %: 1 %
EOS PCT: 1.5 %
Eosinophil #: 0.1 10*3/uL (ref 0.0–0.7)
HCT: 38.4 % (ref 35.0–47.0)
HGB: 12.7 g/dL (ref 12.0–16.0)
Lymphocyte #: 1.2 10*3/uL (ref 1.0–3.6)
Lymphocyte %: 20.7 %
MCH: 31.5 pg (ref 26.0–34.0)
MCHC: 33.1 g/dL (ref 32.0–36.0)
MCV: 95 fL (ref 80–100)
Monocyte #: 0.5 x10 3/mm (ref 0.2–0.9)
Monocyte %: 8.3 %
Neutrophil #: 4.1 10*3/uL (ref 1.4–6.5)
Neutrophil %: 68.5 %
Platelet: 133 10*3/uL — ABNORMAL LOW (ref 150–440)
RBC: 4.04 10*6/uL (ref 3.80–5.20)
RDW: 15.7 % — ABNORMAL HIGH (ref 11.5–14.5)
WBC: 6 10*3/uL (ref 3.6–11.0)

## 2013-12-16 ENCOUNTER — Emergency Department: Payer: Self-pay | Admitting: Emergency Medicine

## 2014-01-06 ENCOUNTER — Ambulatory Visit: Payer: Self-pay | Admitting: Oncology

## 2014-02-06 ENCOUNTER — Ambulatory Visit: Payer: Self-pay | Admitting: Oncology

## 2014-02-06 ENCOUNTER — Ambulatory Visit: Payer: Self-pay | Admitting: Internal Medicine

## 2014-04-13 ENCOUNTER — Ambulatory Visit
Admit: 2014-04-13 | Disposition: A | Discharge status: Home / Self Care | Payer: Self-pay | Attending: Oncology | Admitting: Oncology

## 2014-04-17 ENCOUNTER — Ambulatory Visit
Admit: 2014-04-17 | Disposition: A | Discharge status: Home / Self Care | Payer: Self-pay | Attending: Oncology | Admitting: Oncology

## 2014-04-18 LAB — CANCER ANTIGEN 27.29: CA 27.29: 21.4 U/mL (ref 0.0–38.6)

## 2014-04-29 NOTE — Op Note (Signed)
PATIENT NAME:  Julia Gordon, Julia Gordon MR#:  [id number] DATE OF BIRTH:  1934/12/09  DATE OF PROCEDURE:  12/13/2013  PREOPERATIVE DIAGNOSIS: Left breast carcinoma.   POSTOPERATIVE DIAGNOSIS: Left breast carcinoma.   OPERATION: Left modified radical mastectomy.   SURGEON: Rodena Goldmann III, MD.    ANESTHESIA: General.   OPERATIVE PROCEDURE: With the patient in supine position after the induction of appropriate general anesthesia, the patient's left chest was prepped with ChloraPrep and draped with sterile towels. An elliptical incision was made around the nipple and carried down through the subcutaneous tissue with Bovie electrocautery. Tacking sutures for traction with 3-0 silk were placed in both flaps. The superior flap was created down to the third intercostal space avoiding the defibrillator on that side. The incision was taken down to the chest wall. Inferior flap was taken down just below the inframammary fold using Bovie electrocautery. Several of the larger blood vessels were suture ligated with 3-0 silk. The breast was then swept off the chest wall from medial to lateral extending from the sternum to the latissimus dorsi muscle. The axillary contents were then entered, swept off the axillary vein, and the harmonic scalpel used to provide hemostasis. The specimen was passed off the table for permanent pathology. The area was then irrigated. In the inferior flap, 2 small incisions were made and 10 mm flat Jackson-Pratt drains were inserted, one to the superior flap and one to the axilla. Drains were secured with 3-0 nylon. The skin was then reapproximated using vertical mattress suture of 4-0 nylon. A compressive dressing was applied. The patient was returned to the recovery room, having tolerated the procedure well. Sponge and instrument counts were correct x 2 in the Operating Room.    ____________________________ Micheline Maze, MD rle:at D: 12/13/2013 09:39:13 ET T: 12/13/2013 09:59:41  ET JOB#: [id number]  cc: Rodena Goldmann III, MD, <Dictator> Ellamae Sia, MD Rodena Goldmann MD ELECTRONICALLY SIGNED 12/13/2013 18:30

## 2014-04-29 NOTE — Op Note (Signed)
PATIENT NAME:  Julia Gordon, VESSEY MR#:  [id number] DATE OF BIRTH:  [birth year]  DATE OF PROCEDURE:  10/04/2013  PREOPERATIVE DIAGNOSIS: Left breast carcinoma.   POSTOPERATIVE DIAGNOSIS: Left breast carcinoma.  OPERATION: Left partial mastectomy.   ANESTHESIA: General.   SURGEON: Rodena Goldmann, MD   OPERATIVE PROCEDURE: With the patient in the supine position after induction of appropriate general anesthesia, the patient's left breast was prepped with ChloraPrep and draped with sterile towels. An elliptical incision was made around the palpable lesion identified by the wire and carried down through the subcutaneous tissue with Bovie electrocautery. A large generous portion of the inferior inner and outer quadrants was removed. The specimen was taken off at the chest wall. The specimen was tagged with markers and sent to the mammography department for specimen mammography, which did reveal the presence of the lesion. The area was copiously irrigated. A 7 mm flat Jackson-Pratt drain was inserted through the inframammary fold into the base of the incision. The skin incision was closed with vertical mattress sutures of 4-0 nylon. A drain was secured with 3-0 nylon. Sterile dressings were applied. The patient was returned to the recovery room, having tolerated the procedure well.  Sponge and instrument counts were correct x 2 in the operating room.   ____________________________ Rodena Goldmann III, MD rle:TT D: 10/04/2013 14:50:16 ET T: 10/04/2013 19:47:36 ET JOB#: [id number]  cc: Micheline Maze, MD, <Dictator> Rodena Goldmann MD ELECTRONICALLY SIGNED 10/07/2013 17:51

## 2014-04-29 NOTE — Discharge Summary (Signed)
PATIENT NAME:  [name], [name] MR#:  [id number] DATE OF BIRTH:  [birth year]  DATE OF ADMISSION:  12/13/2013 DATE OF DISCHARGE:  12/14/2013  BRIEF HISTORY:  Julia Gordon is a 80 year old woman with a recently discovered left breast carcinoma. She elected to have a partial mastectomy and radiation therapy. She had had a previous diagnosis of metastatic breast cancer on lymph node biopsy in the left axilla. The procedure was uncomplicated. She did well. When being set up for her radiation the radiation therapist felt she should have an axillary dissection and requested that the patient's previously placed defibrillator be moved. The patient was concerned about moving the defibrillator, the excess surgery, and the radiation. She elected to proceed with mastectomy and lymph node dissection. After appropriate preoperative preparation and informed consent she was taken to surgery on the morning of December 8 where she underwent modified radical mastectomy with axillary lymph node dissection. The procedure was uncomplicated. She had no significant intraoperative problems. She had some mild nausea and modest drainage, but did well over the course of the evening. This morning she is up, active, tolerating a diet with no complaints. She will be discharged home today to be followed in the office in 7-10 days time for JP evaluation and possible removal.   DISCHARGE MEDICATIONS: Include aspirin 81 mg once a day, albuterol inhaler b.i.d., Flonase 50 mcg once a day, Combivent 100 mcg 4 times a day, Claritin 10 mg once a day p.r.n., isosorbide mononitrate once a day, metoprolol 50 once a day, spironolactone 25 once a day, potassium 20 mEq once a day, Lasix 40 mg once a day, lisinopril 10 mg once a day, lovastatin 80 mg once a day, and Vicodin 5/325 every 4-6 hours.   FINAL DISCHARGE DIAGNOSIS:  Left breast carcinoma.    SURGERY: Left modified radical mastectomy.     ____________________________ Rodena Goldmann III,  MD rle:bu D: 12/14/2013 12:08:58 ET T: 12/14/2013 15:29:45 ET JOB#: [id number]  cc: Rodena Goldmann III, MD, <Dictator> Ellamae Sia, MD Rodena Goldmann MD ELECTRONICALLY SIGNED 12/14/2013 16:47

## 2014-04-29 NOTE — Consult Note (Signed)
Reason for Visit: This 80 year old Female patient presents to the clinic for initial evaluation of  breast cancer .   Referred by Dr. Grayland Ormond.  Diagnosis:  Chief Complaint/Diagnosis   80 year old female with stage III invasive mammary carcinoma of the left breast status post neoadjuvant chemotherapy than wide local excision for ER/PR positive HER-2/neu negative lesion. Axilla has not been staged at this point. Patient also has left anterior chest wall defibrillator that needs to be moved.  Pathology Report pathology report reviewed   Imaging Report mammograms and ultrasound reviewed   Referral Report clinical notes reviewed   Planned Treatment Regimen whole breast and peripheral lymphatic radiation   HPI   patient is a 80 year old female who presented with a self discovered mass in her left breast.. Mammogram demonstrated a 2.5 cm mass in the anterior 9:00 position was noted and confirmed on ultrasound. Also highly suspicious to small axillary lymph nodes were noted. She underwent core biopsy of both axilla and breast mass both positive for invasive mammary carcinoma.she underwent neoadjuvant chemotherapyconsisting of Cytoxan and Taxotere for 4 cycles. She then underwent a wide local excision. Tumor was residual 1.9 cm overall grade 2 margins clear but close for ductal carcinoma in situ and invasive carcinoma at 1 mm. Lymph vascular invasion was present. No sentinel lymph node or axillary dissection was performed. Patient has an anterior left chest wall defibrillator placed about 5 years ago at [facility]. She is doing well postoperatively specifically denies breast tenderness cough or bone pain.she is now referred duration oncology for opinion.  Past Hx:    Osteoarthritis:    Anemia:    Colon Cancer:    Hx: Atrial Fibrillation:    Asthma:    MI:    COPD:    CHF:    Hypercholesterolemia:    Hypertension:    Comptroller:    Hysterectomy:    Total  Knee Replacement/Right:    Knee Surgery - Right:   Past, Family and Social History:  Past Medical History positive   Cardiovascular atrial fibrillation; congestive heart failure; hyperlipidemia; hypertension; myocardial infarction; defibrillator placed in anterior chest   Respiratory asthma; COPD   Past Surgical History nephrectomy, right total knee replacement   Past Medical History Comments osteoarthritis, anemia   Family History positive   Family History Comments mother and father with hypertension sister with breast cancer mother also with cancer of unknown type   Social History positive   Social History Comments 40-pack-year smoking history, quit in 2007 no EtOH use history   Additional Past Medical and Surgical History seen by herself today   Allergies:   No Known Allergies:   Home Meds:  Home Medications: Medication Instructions Status  Claritin 10 mg oral tablet 1 tab(s) orally once a day, As Needed for allergies. Active  Lasix 40 mg oral tablet 1  orally once a day, As Needed if I eat a lot of fried foods and have a lot of fluid. Active  lisinopril 10 mg oral tablet 1 tab(s) orally once a day Active  Metoprolol Succinate ER 50 mg oral tablet, extended release 1 tab(s) orally once a day Active  potassium chloride 20 mEq oral tablet, extended release 1 tab(s) orally once a day Active  spironolactone 25 mg oral tablet 1 tab(s) orally once a day Active  lovastatin 40 mg oral tablet 2 tab(s) orally once a day (at bedtime) Active  isosorbide mononitrate 30 mg oral tablet, extended release 1 tab(s) orally once a day (in  the morning) Active  multivitamin with minerals 1 tab(s) orally once a day ABC Plus Active  albuterol-ipratropium solution 2.5 mg-0.5 mg/3 mL 1 dose(s)  2 times a day  PRN Active  aspirin 81 mg oral tablet 1  orally once a day  Active  Flonase 50 mcg/inh nasal spray 1 spray(s) nasal once a day, As Needed for nasal congestion. Active  Combivent CFC free  100 mcg-20 mcg/inh inhalation aerosol 1 puff(s) inhaled 4 times a day  PRN Active  Fish Oil - oral capsule 600 milligram(s) orally once a day Active  Percocet 5/325  orally 1-2 every 6 hours as needed for moderate to severe pain Active   Review of Systems:  Performance Status (ECOG) 0   Skin negative   Breast see HPI   Ophthalmologic negative   ENMT negative   Respiratory and Thorax negative   Cardiovascular see HPI   Gastrointestinal negative   Genitourinary negative   Musculoskeletal negative   Neurological negative   Psychiatric negative   Hematology/Lymphatics negative   Endocrine negative   Allergic/Immunologic negative   Nursing Notes:  Nursing Vital Signs and Chemo Nursing Nursing Notes: *CC Vital Signs Flowsheet:   29-Oct-15 10:15  Temp Temperature 97.3  Pulse Pulse 57  Respirations Respirations 18  SBP SBP 140  DBP DBP 79  Current Weight (kg) (kg) 73  Height (cm) centimeters 157.5  BSA (m2) 1.7   Physical Exam:  General/Skin/HEENT:  Skin normal   Eyes normal   ENMT normal   Head and Neck normal   Additional PE a well-developed wheelchair-bound female in NAD. She has an irregularly irregular heartbeat. She has a defibrillator placed in the left anterior chest wall. She is status post wide local excision of the left breast with single incision in a horizontal manner. No dominant mass or nodularity is noted in either breast in 2 positions examined. No axillary or supraclavicar adenopathntified. Lungs are clear to A&P. Abdomen is benign.   Breasts/Resp/CV/GI/GU:  Respiratory and Thorax normal   Cardiovascular normal   Gastrointestinal normal   Genitourinary normal   MS/Neuro/Psych/Lymph:  Musculoskeletal normal   Neurological normal   Lymphatics normal   Other Results:  Radiology Results: LabUnknown:    14-May-15 16:30, CT Chest, Abd, and Pelvis With Contrast  PACS Image   CT:  CT Chest, Abd, and Pelvis With Contrast   REASON  FOR EXAM:    Initial stage breast CA  COMMENTS:       PROCEDURE: CT  - CT CHEST ABDOMEN AND PELVIS W  - May 19 2013  4:30PM     CLINICAL DATA:  History of Colon carcinoma, recently diagnosed  breast carcinoma.    EXAM:  CT CHEST, ABDOMEN, AND PELVIS WITH CONTRAST    TECHNIQUE:  Multidetector CT imaging of the chest, abdomen and pelvis was  performed following the standard protocol during bolus  administration of intravenous contrast.  CONTRAST:  125 mL Isovue 370 IV    COMPARISON:  07/31/2008    FINDINGS:  CT CHEST FINDINGS    Left subclavian pacemaker extends to the right ventricular apex.  Extensive coronary in patchy aortic calcifications. No pleural or  pericardial effusion. Subcentimeter prevascular and left axillary  lymph nodes.No hilar adenopathy. Minimal linear scarring or  subsegmental atelectasis, posterior left lower lobe. Lungs are  otherwise clear. Thoracic spine and sternum unremarkable.    CT ABDOMEN AND PELVIS FINDINGS  Tortuous atheromatous aorta with heavy calcified plaque at the  origin of the SMA and  left renal artery. Unremarkable liver, spleen,  adrenal glands, kidneys, pancreas. Small hiatal hernia. Stomach and  small bowel are nondilated. Changes of partial right hemicolectomy.  Urinary bladder physiologically distended. Possible urethral  diverticula. Uterus surgically absent. No ascites. No free air. No  adenopathy. Degenerative disc disease at all levels in the lumbar  spine, with lumbar dextroscoliosis apex L3.     IMPRESSION:  1. Negative for mass, adenopathy, or evidence of metastatic disease.  2. Atherosclerosis, including aortoiliac and coronary artery  disease. Please note that although the presence of coronary artery  calcium documents the presence of coronary artery disease, the  severity of this disease and any potential stenosis cannot be  assessed on this non-gated CT examination. Assessment for potential  risk factor modification,  dietary therapy or pharmacologic therapy  may be warranted, if clinically indicated.      Electronically Signed    By: Arne Cleveland M.D.    On: 05/19/2013 16:43         Verified By: Kandis Cocking, M.D.,   Relevent Results:   Relevant Scans and Labs mammogram ultrasound and CT scans reviewed   Assessment and Plan: Impression:   80 year old female status post neoadjuvant chemotherapy than wide local excision for a stage IIIa invasive mammary carcinoma with the axilla not staged.tumor is ER/PR positive HER-2/neu negative. Plan:   at this time according to an CCN guidelines we would like to see a level XII axillary lymph node dissection performed or at least a sentinel lymph node biopsy to confirm response in her axilla to neoadjuvant therapy. I also would need her defibrillator moved her right anterior chest and hopefully this can both be accomplished in the same procedure. I discussed the case personally with medical oncology concurs with my opinion. I am referring the patient back to surgeon to try to arrange all procedures Overall patient will need left breast and peripheral lymphatic radiation up to 5000 cGy over 5eks boosting ed on the close  Risks and benefits of radiation to her breast were explained to the patient in detail. Side effects such as skin reaction, fatigue, inclusion of normal lung, alteration of blood counts, and possibility of lymphedema in her left upper extremity all were explained in detail to the patient. I set her up for follow-up shortly after her appointment with surgeon.  I would like to take this opportunity for allowing me to participate in the care of your patient..  Fax to Physician:  Physicians To Recieve Fax: Trudie Reed - [phone].  Electronic Signatures: Armstead Peaks (MD)  (Signed 29-Oct-15 12:29)  Authored: HPI, Diagnosis, Past Hx, PFSH, Allergies, Home Meds, ROS, Nursing Notes, Physical Exam, Other Results, Relevent Results, Encounter  Assessment and Plan, Fax to Physician   Last Updated: 29-Oct-15 12:29 by Armstead Peaks (MD)

## 2014-04-29 NOTE — Op Note (Signed)
PATIENT NAME:  LANDI, BISCARDI MR#:  [id number] DATE OF BIRTH:  1934/03/19  DATE OF PROCEDURE:  06/21/2013  PREOPERATIVE DIAGNOSIS: Breast carcinoma.   POSTOPERATIVE DIAGNOSIS: Breast carcinoma.   PROCEDURE PERFORMED: Insertion of right internal jugular Port-A-Cath with ultrasound and fluoroscopic guidance.   PROCEDURE PERFORMED BY: Katha Cabal, MD   SEDATION: Versed 3 mg plus fentanyl 75 mcg administered IV. Continuous ECG, pulse oximetry and cardiopulmonary monitoring was performed throughout the entire procedure by the interventional radiology nurse. Total sedation time is 40 minutes.   ACCESS: Right IJ.   CONTRAST USED: None.   FLUOROSCOPY TIME: 0.2 minutes.   INDICATIONS: Ms. Fehr is a 80 year old woman who has now been found to have stage III breast carcinoma of the left breast. She is therefore undergoing chemotherapy and will require appropriate IV access. Risks and benefits were reviewed. The patient has agreed to proceed.   DESCRIPTION OF PROCEDURE: The patient is taken to the special procedure suite, placed in the supine position. After adequate sedation is achieved, her right neck and chest wall are prepped and draped in a sterile fashion. Lidocaine 1% is infiltrated in the soft tissues. Ultrasound is placed in a sterile sleeve. Jugular vein is identified. It is echolucent and compressible, indicating patency. Image is recorded for the permanent record, and real-time ultrasound guidance is utilized to access the jugular vein with a Seldinger needle. A J-wire is advanced under fluoroscopic guidance into the inferior vena cava.   Incision is then made 2 fingerbreadths below the clavicle and a pocket fashioned with both blunt and sharp dissection. It is checked for appropriate size. A small pocket is created at the wire insertion site.   The catheter is then pulled subcutaneously from the pocket incision to the neck counterincision. Dilator with peel-away sheath is inserted  over the wire and the wire and the dilator removed, and the catheter is inserted into the venous system. Under fluoroscopy, the catheter tip is adjusted so that it is at the atriocaval junction. It is then transected, and the hub is connected. The hub is then slipped into the pocket. The port is then accessed percutaneously with a Huber needle. It aspirates easily and flushes well. Under fluoroscopy, it has a smooth contour, with the tip in the appropriate position. The pocket incision is then closed using interrupted 3-0 Vicryl, followed by 4-0 Monocryl subcuticular and then Dermabond. Neck counterincision is closed with 4-0 Monocryl subcuticular and Dermabond. The patient tolerated the procedure well, and there were no immediate complications. Sponge and needle counts were correct, and she is taken to recovery in excellent condition.    ____________________________ Katha Cabal, MD ggs:lb D: 06/21/2013 11:00:08 ET T: 06/21/2013 12:14:16 ET JOB#: [id number]  cc: Katha Cabal, MD, <Dictator> Kathlene November. Grayland Ormond, MD Ellamae Sia, MD Katha Cabal MD ELECTRONICALLY SIGNED 07/12/2013 15:18

## 2014-05-01 LAB — SURGICAL PATHOLOGY

## 2014-07-17 ENCOUNTER — Other Ambulatory Visit: Payer: Self-pay

## 2014-07-17 DIAGNOSIS — C50212 Malignant neoplasm of upper-inner quadrant of left female breast: Secondary | ICD-10-CM | POA: Insufficient documentation

## 2014-07-17 DIAGNOSIS — C50919 Malignant neoplasm of unspecified site of unspecified female breast: Secondary | ICD-10-CM

## 2014-07-18 ENCOUNTER — Encounter: Payer: Self-pay | Admitting: Oncology

## 2014-07-18 ENCOUNTER — Inpatient Hospital Stay: Payer: Medicare PPO

## 2014-07-18 ENCOUNTER — Inpatient Hospital Stay (HOSPITAL_BASED_OUTPATIENT_CLINIC_OR_DEPARTMENT_OTHER): Payer: Medicare PPO | Admitting: Oncology

## 2014-07-18 ENCOUNTER — Inpatient Hospital Stay: Payer: Medicare PPO | Attending: Oncology

## 2014-07-18 VITALS — BP 139/83 | HR 60 | Temp 96.3°F | Resp 16 | Wt 175.5 lb

## 2014-07-18 DIAGNOSIS — C50919 Malignant neoplasm of unspecified site of unspecified female breast: Secondary | ICD-10-CM

## 2014-07-18 DIAGNOSIS — J449 Chronic obstructive pulmonary disease, unspecified: Secondary | ICD-10-CM | POA: Diagnosis not present

## 2014-07-18 DIAGNOSIS — Z90710 Acquired absence of both cervix and uterus: Secondary | ICD-10-CM | POA: Diagnosis not present

## 2014-07-18 DIAGNOSIS — Z9012 Acquired absence of left breast and nipple: Secondary | ICD-10-CM | POA: Insufficient documentation

## 2014-07-18 DIAGNOSIS — Z85038 Personal history of other malignant neoplasm of large intestine: Secondary | ICD-10-CM

## 2014-07-18 DIAGNOSIS — Z79811 Long term (current) use of aromatase inhibitors: Secondary | ICD-10-CM | POA: Diagnosis not present

## 2014-07-18 DIAGNOSIS — M25561 Pain in right knee: Secondary | ICD-10-CM

## 2014-07-18 DIAGNOSIS — Z79899 Other long term (current) drug therapy: Secondary | ICD-10-CM | POA: Diagnosis not present

## 2014-07-18 DIAGNOSIS — Z87891 Personal history of nicotine dependence: Secondary | ICD-10-CM | POA: Insufficient documentation

## 2014-07-18 DIAGNOSIS — Z950 Presence of cardiac pacemaker: Secondary | ICD-10-CM | POA: Insufficient documentation

## 2014-07-18 DIAGNOSIS — Z170 Estrogen receptor positive status [ER+]: Secondary | ICD-10-CM | POA: Insufficient documentation

## 2014-07-18 DIAGNOSIS — M810 Age-related osteoporosis without current pathological fracture: Secondary | ICD-10-CM

## 2014-07-18 DIAGNOSIS — C50912 Malignant neoplasm of unspecified site of left female breast: Secondary | ICD-10-CM | POA: Diagnosis not present

## 2014-07-18 DIAGNOSIS — I4891 Unspecified atrial fibrillation: Secondary | ICD-10-CM | POA: Insufficient documentation

## 2014-07-18 DIAGNOSIS — I429 Cardiomyopathy, unspecified: Secondary | ICD-10-CM | POA: Insufficient documentation

## 2014-07-18 DIAGNOSIS — I10 Essential (primary) hypertension: Secondary | ICD-10-CM | POA: Diagnosis not present

## 2014-07-18 NOTE — Progress Notes (Signed)
Patient is taking Fosamax but on the days she takes it she has excessive sleepiness and would like to discuss if she needs to continue.

## 2014-07-19 LAB — CANCER ANTIGEN 27.29: CA 27.29: 20.1 U/mL (ref 0.0–38.6)

## 2014-08-01 NOTE — Progress Notes (Signed)
[place]  Telephone:(336) [phone] Fax:(336) [phone]  ID: Julia Gordon OB: Aug 25, 1934  MR#: [id number]  BDZ#:[id number]  Patient Care Team: Ellamae Sia, MD as PCP - General (Internal Medicine)  CHIEF COMPLAINT:  Chief Complaint  Patient presents with  . Follow-up    breast cancer    INTERVAL HISTORY: Patient returns to clinic today for routine 3 month evaluation. She is tolerating letrozole well without significant side effects. Currently, she feels well and remains asymptomatic. She has chronic right knee pain. She denies any neurologic complaints. She denies any fever, chills, or night sweats.  She has a good appetite and denies weight loss.  She denies any chest pain, shortness of breath, cough, or hemoptysis.  Patient offers no specific complaints today.  REVIEW OF SYSTEMS:   Review of Systems  Constitutional: Negative.   Neurological: Negative.   Endo/Heme/Allergies: Negative.     As per HPI. Otherwise, a complete review of systems is negatve.  PAST MEDICAL HISTORY: Past Medical History  Diagnosis Date  . Pacemaker 05/2007    [employer] ICD, Guidant lead, [facility] Dr Isaias Sakai  . HTN (hypertension)   . Other primary cardiomyopathies   . Atrial fibrillation     history of  . LV (left ventricular) mural thrombus     history of, resolved (echo 04/09)  . HLD (hyperlipidemia)   . COPD (chronic obstructive pulmonary disease)   . Colon adenocarcinoma   . History of depression   . DJD (degenerative joint disease) of knee     right knee  . H/O hysterectomy with oophorectomy     for DUB  . Anemia   . Depression   . Breast cancer     PAST SURGICAL HISTORY: Past Surgical History  Procedure Laterality Date  . Pacemaker insertion  2009  . Replacement total knee  1990's    right   . Laparoscopic right colon resection  08/10    Dr Pat Patrick, for adenoca   . Mastectomy Left     FAMILY HISTORY Family History  Problem Relation Age of Onset    . Diabetes      brothers  . Hypertension Father   . Alzheimer's disease Mother   . Stroke Brother   . Heart failure Brother   . Breast cancer Sister   . Lupus Sister        ADVANCED DIRECTIVES:    HEALTH MAINTENANCE: History  Substance Use Topics  . Smoking status: Former Research scientist (life sciences)  . Smokeless tobacco: Never Used     Comment: quit 2006  . Alcohol Use: No     Comment: former     Colonoscopy:  PAP:  Bone density:  Lipid panel:  Allergies  Allergen Reactions  . No Known Allergies     Current Outpatient Prescriptions  Medication Sig Dispense Refill  . alendronate (FOSAMAX) 70 MG tablet     . aspirin 81 MG tablet Take 81 mg by mouth daily.    Marland Kitchen ipratropium-albuterol (DUONEB) 0.5-2.5 (3) MG/3ML SOLN Take by nebulization.    . isosorbide mononitrate (IMDUR) 30 MG 24 hr tablet Take 1 tablet by mouth Daily.    Marland Kitchen KLOR-CON M20 20 MEQ tablet Take 1 tablet by mouth Daily.    Marland Kitchen letrozole (FEMARA) 2.5 MG tablet     . lisinopril (PRINIVIL,ZESTRIL) 10 MG tablet Take 1 tablet by mouth Daily.    Marland Kitchen lovastatin (MEVACOR) 40 MG tablet Take 2 tablets by mouth Daily.    . metoprolol succinate (TOPROL-XL)  50 MG 24 hr tablet     . MULTIPLE VITAMIN PO Take by mouth daily.    Marland Kitchen spironolactone (ALDACTONE) 25 MG tablet Take 1 tablet by mouth Daily.     No current facility-administered medications for this visit.    OBJECTIVE: Filed Vitals:   07/18/14 1115  BP: 139/83  Pulse: 60  Temp: 96.3 F (35.7 C)  Resp: 16     Body mass index is 32.09 kg/(m^2).    ECOG FS:0 - Asymptomatic  General: Well-developed, well-nourished, no acute distress. Eyes: Pink conjunctiva, anicteric sclera. Breasts: Left breast with well-healed mastectomy car, right breast and axilla without evidence of disease. Lungs: Clear to auscultation bilaterally. Heart: Regular rate and rhythm. No rubs, murmurs, or gallops. Abdomen: Soft, nontender, nondistended. No organomegaly noted, normoactive bowel  sounds. Musculoskeletal: No edema, cyanosis, or clubbing. Neuro: Alert, answering all questions appropriately. Cranial nerves grossly intact. Skin: No rashes or petechiae noted. Psych: Normal affect.   LAB RESULTS:  Lab Results  Component Value Date   NA 136 12/13/2013   K 3.8 12/13/2013   CL 104 12/13/2013   CO2 28 12/13/2013   GLUCOSE 100* 12/13/2013   BUN 16 12/13/2013   CREATININE 1.01 12/13/2013   CALCIUM 9.7 12/13/2013   PROT 7.5 10/06/2013   ALBUMIN 3.7 10/06/2013   AST 23 10/06/2013   ALT 15 10/06/2013   ALKPHOS 55 10/06/2013   BILITOT 0.4 10/06/2013   GFRNONAA 58* 09/27/2013   GFRAA >60 09/27/2013    Lab Results  Component Value Date   WBC 6.0 12/13/2013   NEUTROABS 4.1 12/13/2013   HGB 12.7 12/13/2013   HCT 38.4 12/13/2013   MCV 95 12/13/2013   PLT 133* 12/13/2013     STUDIES: No results found.  ASSESSMENT: Pathologic stage IIIa adenocarcinoma of the left breast, ER/PR positive, HER-2 not overexpressing, status post neoadjuvant chemotherapy and mastectomy.  PLAN:    1. Breast cancer: Patient completed 4 cycles of neoadjuvant chemotherapy using Taxotere and Cytoxan. Her left breast mastectomy revealed significant residual disease. After lengthy discussion, patient has refused adjuvant XRT. She acknowledges that the risk of recurrence increases significantly. Continue letrozole completing in January 2021. Return to clinic in 3 months with repeat laboratory work and further evaluation. Patient expressed understanding and was in agreement with this plan. 2. Colon cancer: Colonoscopy in January of 2014 revealed no evidence of disease. Most recent CEA was within normal limits. 3. Osteoporosis: Bone mineral density on April 13, 2014 revealed a T score of -2.7. Continue Fosamax, calcium, and vitamin D. Repeat in one year. 4. Family history: Patient may benefit from genetic testing in the future.   Patient expressed understanding and was in agreement with this  plan. She also understands that She can call clinic at any time with any questions, concerns, or complaints.   Breast cancer   Staging form: Breast, AJCC 7th Edition     Clinical stage from 08/01/2014: Stage IIIA (T1c, N2a, M0) - Signed by Lloyd Huger, MD on 08/01/2014   Lloyd Huger, MD   08/01/2014 5:13 PM

## 2014-10-18 ENCOUNTER — Ambulatory Visit: Payer: Medicare PPO | Admitting: Oncology

## 2014-10-18 ENCOUNTER — Inpatient Hospital Stay: Payer: Medicare PPO | Admitting: Oncology

## 2014-11-02 ENCOUNTER — Inpatient Hospital Stay: Payer: Medicare PPO | Attending: Oncology | Admitting: Oncology

## 2014-11-02 VITALS — BP 131/74 | HR 59 | Temp 97.1°F | Resp 18

## 2014-11-02 DIAGNOSIS — Z85038 Personal history of other malignant neoplasm of large intestine: Secondary | ICD-10-CM | POA: Diagnosis not present

## 2014-11-02 DIAGNOSIS — M25561 Pain in right knee: Secondary | ICD-10-CM | POA: Insufficient documentation

## 2014-11-02 DIAGNOSIS — C50912 Malignant neoplasm of unspecified site of left female breast: Secondary | ICD-10-CM | POA: Insufficient documentation

## 2014-11-02 DIAGNOSIS — Z79811 Long term (current) use of aromatase inhibitors: Secondary | ICD-10-CM

## 2014-11-02 DIAGNOSIS — F329 Major depressive disorder, single episode, unspecified: Secondary | ICD-10-CM | POA: Insufficient documentation

## 2014-11-02 DIAGNOSIS — Z7982 Long term (current) use of aspirin: Secondary | ICD-10-CM | POA: Diagnosis not present

## 2014-11-02 DIAGNOSIS — Z87891 Personal history of nicotine dependence: Secondary | ICD-10-CM | POA: Insufficient documentation

## 2014-11-02 DIAGNOSIS — Z950 Presence of cardiac pacemaker: Secondary | ICD-10-CM | POA: Diagnosis not present

## 2014-11-02 DIAGNOSIS — Z79899 Other long term (current) drug therapy: Secondary | ICD-10-CM | POA: Insufficient documentation

## 2014-11-02 DIAGNOSIS — Z9012 Acquired absence of left breast and nipple: Secondary | ICD-10-CM | POA: Diagnosis not present

## 2014-11-02 DIAGNOSIS — I10 Essential (primary) hypertension: Secondary | ICD-10-CM | POA: Diagnosis not present

## 2014-11-02 DIAGNOSIS — Z170 Estrogen receptor positive status [ER+]: Secondary | ICD-10-CM | POA: Diagnosis not present

## 2014-11-02 DIAGNOSIS — Z9221 Personal history of antineoplastic chemotherapy: Secondary | ICD-10-CM | POA: Diagnosis not present

## 2014-11-02 DIAGNOSIS — E785 Hyperlipidemia, unspecified: Secondary | ICD-10-CM | POA: Insufficient documentation

## 2014-11-02 DIAGNOSIS — I4891 Unspecified atrial fibrillation: Secondary | ICD-10-CM | POA: Insufficient documentation

## 2014-11-02 DIAGNOSIS — M810 Age-related osteoporosis without current pathological fracture: Secondary | ICD-10-CM | POA: Insufficient documentation

## 2014-11-02 DIAGNOSIS — J449 Chronic obstructive pulmonary disease, unspecified: Secondary | ICD-10-CM | POA: Insufficient documentation

## 2014-11-02 DIAGNOSIS — Z90710 Acquired absence of both cervix and uterus: Secondary | ICD-10-CM | POA: Diagnosis not present

## 2014-11-02 NOTE — Progress Notes (Signed)
Only concern patient offers today is increase in appetite with the Letrozole.

## 2014-11-08 ENCOUNTER — Ambulatory Visit: Payer: Medicare PPO | Attending: Oncology

## 2014-11-12 NOTE — Progress Notes (Signed)
[place]  Telephone:(336) [phone] Fax:(336) [phone]  ID: [name] OB: 15-Aug-1934  MR#: [id number]  WOE#:[id number]  Patient Care Team: Ellamae Sia, MD as PCP - General (Internal Medicine)  CHIEF COMPLAINT:  Chief Complaint  Patient presents with  . Breast Cancer    INTERVAL HISTORY: Patient returns to clinic today for routine 3 month evaluation. She continues to tolerate letrozole well without significant side effects. Currently, she feels well and remains asymptomatic. She has chronic right knee pain. She denies any neurologic complaints. She denies any fever, chills, or night sweats.  She has a good appetite and denies weight loss.  She denies any chest pain, shortness of breath, cough, or hemoptysis.  Patient offers no specific complaints today.  REVIEW OF SYSTEMS:   Review of Systems  Constitutional: Negative.   Respiratory: Negative.   Cardiovascular: Negative.   Gastrointestinal: Negative.   Musculoskeletal: Positive for joint pain.  Neurological: Negative.   Endo/Heme/Allergies: Negative.     As per HPI. Otherwise, a complete review of systems is negatve.  PAST MEDICAL HISTORY: Past Medical History  Diagnosis Date  . Pacemaker 05/2007    [employer] ICD, Guidant lead, [facility] Dr Isaias Sakai  . HTN (hypertension)   . Other primary cardiomyopathies   . Atrial fibrillation     history of  . LV (left ventricular) mural thrombus     history of, resolved (echo 04/09)  . HLD (hyperlipidemia)   . COPD (chronic obstructive pulmonary disease)   . Colon adenocarcinoma   . History of depression   . DJD (degenerative joint disease) of knee     right knee  . H/O hysterectomy with oophorectomy     for DUB  . Anemia   . Depression   . Breast cancer     PAST SURGICAL HISTORY: Past Surgical History  Procedure Laterality Date  . Pacemaker insertion  2009  . Replacement total knee  1990's    right   . Laparoscopic right colon resection   08/10    Dr Pat Patrick, for adenoca   . Mastectomy Left     FAMILY HISTORY Family History  Problem Relation Age of Onset  . Diabetes      brothers  . Hypertension Father   . Alzheimer's disease Mother   . Stroke Brother   . Heart failure Brother   . Breast cancer Sister   . Lupus Sister        ADVANCED DIRECTIVES:    HEALTH MAINTENANCE: Social History  Substance Use Topics  . Smoking status: Former Research scientist (life sciences)  . Smokeless tobacco: Never Used     Comment: quit 2006  . Alcohol Use: No     Comment: former     Colonoscopy:  PAP:  Bone density:  Lipid panel:  Allergies  Allergen Reactions  . No Known Allergies     Current Outpatient Prescriptions  Medication Sig Dispense Refill  . alendronate (FOSAMAX) 70 MG tablet     . aspirin 81 MG tablet Take 81 mg by mouth daily.    . furosemide (LASIX) 40 MG tablet     . ipratropium-albuterol (DUONEB) 0.5-2.5 (3) MG/3ML SOLN Take by nebulization.    . isosorbide mononitrate (IMDUR) 30 MG 24 hr tablet Take 1 tablet by mouth Daily.    Marland Kitchen KLOR-CON M20 20 MEQ tablet Take 1 tablet by mouth Daily.    Marland Kitchen letrozole (FEMARA) 2.5 MG tablet     . lisinopril (PRINIVIL,ZESTRIL) 10 MG tablet Take 1 tablet  by mouth Daily.    Marland Kitchen lovastatin (MEVACOR) 40 MG tablet Take 2 tablets by mouth Daily.    . metoprolol succinate (TOPROL-XL) 50 MG 24 hr tablet     . MULTIPLE VITAMIN PO Take by mouth daily.    Marland Kitchen spironolactone (ALDACTONE) 25 MG tablet Take 1 tablet by mouth Daily.     No current facility-administered medications for this visit.    OBJECTIVE: Filed Vitals:   11/02/14 1025  BP: 131/74  Pulse: 59  Temp: 97.1 F (36.2 C)  Resp: 18     There is no weight on file to calculate BMI.    ECOG FS:0 - Asymptomatic  General: Well-developed, well-nourished, no acute distress. Eyes: Pink conjunctiva, anicteric sclera. Breasts: Left breast with well-healed mastectomy car, right breast and axilla without evidence of disease. Lungs: Clear to  auscultation bilaterally. Heart: Regular rate and rhythm. No rubs, murmurs, or gallops. Abdomen: Soft, nontender, nondistended. No organomegaly noted, normoactive bowel sounds. Musculoskeletal: No edema, cyanosis, or clubbing. Neuro: Alert, answering all questions appropriately. Cranial nerves grossly intact. Skin: No rashes or petechiae noted. Psych: Normal affect.   LAB RESULTS:  Lab Results  Component Value Date   NA 136 12/13/2013   K 3.8 12/13/2013   CL 104 12/13/2013   CO2 28 12/13/2013   GLUCOSE 100* 12/13/2013   BUN 16 12/13/2013   CREATININE 1.01 12/13/2013   CALCIUM 9.7 12/13/2013   PROT 7.5 10/06/2013   ALBUMIN 3.7 10/06/2013   AST 23 10/06/2013   ALT 15 10/06/2013   ALKPHOS 55 10/06/2013   BILITOT 0.4 10/06/2013   GFRNONAA 56* 12/13/2013   GFRAA >60 12/13/2013    Lab Results  Component Value Date   WBC 6.0 12/13/2013   NEUTROABS 4.1 12/13/2013   HGB 12.7 12/13/2013   HCT 38.4 12/13/2013   MCV 95 12/13/2013   PLT 133* 12/13/2013     STUDIES: No results found.  ASSESSMENT: Pathologic stage IIIa adenocarcinoma of the left breast, ER/PR positive, HER-2 not overexpressing, status post neoadjuvant chemotherapy and mastectomy.  PLAN:    1. Breast cancer: Patient completed 4 cycles of neoadjuvant chemotherapy using Taxotere and Cytoxan on August 09, 2013. Despite this, left breast mastectomy revealed significant residual disease. Adjuvant XRT was recommended, but patient refused. She acknowledged that this would increase her risk of recurrence but she still declined. Continue letrozole completing in January 2021. Patient will require a mammogram for right breast in the next 1-2 weeks. Return to clinic in 3 months with repeat laboratory work and further evaluation. 2. Colon cancer: Colonoscopy in January of 2014 revealed no evidence of disease. Most recent CEA was within normal limits. 3. Osteoporosis: Bone mineral density on April 13, 2014 revealed a T score of  -2.7. Continue Fosamax, calcium, and vitamin D. Repeat in one year. 4. Family history: Patient may benefit from genetic testing in the future.   Patient expressed understanding and was in agreement with this plan. She also understands that She can call clinic at any time with any questions, concerns, or complaints.   Breast cancer   Staging form: Breast, AJCC 7th Edition     Clinical stage from 08/01/2014: Stage IIIA (T1c, N2a, M0) - Signed by Lloyd Huger, MD on 08/01/2014   Lloyd Huger, MD   11/12/2014 7:48 AM

## 2014-12-11 ENCOUNTER — Other Ambulatory Visit: Payer: Self-pay | Admitting: Oncology

## 2015-02-05 ENCOUNTER — Inpatient Hospital Stay: Payer: 59 | Admitting: Oncology

## 2015-02-05 ENCOUNTER — Inpatient Hospital Stay: Payer: 59

## 2015-02-09 ENCOUNTER — Other Ambulatory Visit: Payer: Self-pay | Admitting: Oncology

## 2015-02-14 ENCOUNTER — Ambulatory Visit: Payer: 59 | Admitting: Oncology

## 2015-02-14 ENCOUNTER — Other Ambulatory Visit: Payer: Self-pay

## 2015-02-15 ENCOUNTER — Inpatient Hospital Stay: Payer: Medicare Other | Attending: Oncology

## 2015-02-15 ENCOUNTER — Inpatient Hospital Stay (HOSPITAL_BASED_OUTPATIENT_CLINIC_OR_DEPARTMENT_OTHER): Payer: Medicare Other | Admitting: Oncology

## 2015-02-15 VITALS — BP 133/83 | HR 60 | Temp 97.7°F | Resp 16 | Wt 172.6 lb

## 2015-02-15 DIAGNOSIS — I4891 Unspecified atrial fibrillation: Secondary | ICD-10-CM | POA: Diagnosis not present

## 2015-02-15 DIAGNOSIS — Z950 Presence of cardiac pacemaker: Secondary | ICD-10-CM | POA: Insufficient documentation

## 2015-02-15 DIAGNOSIS — I10 Essential (primary) hypertension: Secondary | ICD-10-CM | POA: Insufficient documentation

## 2015-02-15 DIAGNOSIS — J449 Chronic obstructive pulmonary disease, unspecified: Secondary | ICD-10-CM | POA: Diagnosis not present

## 2015-02-15 DIAGNOSIS — E785 Hyperlipidemia, unspecified: Secondary | ICD-10-CM | POA: Diagnosis not present

## 2015-02-15 DIAGNOSIS — Z85038 Personal history of other malignant neoplasm of large intestine: Secondary | ICD-10-CM | POA: Insufficient documentation

## 2015-02-15 DIAGNOSIS — Z170 Estrogen receptor positive status [ER+]: Secondary | ICD-10-CM

## 2015-02-15 DIAGNOSIS — Z90710 Acquired absence of both cervix and uterus: Secondary | ICD-10-CM | POA: Insufficient documentation

## 2015-02-15 DIAGNOSIS — C50912 Malignant neoplasm of unspecified site of left female breast: Secondary | ICD-10-CM

## 2015-02-15 DIAGNOSIS — Z7982 Long term (current) use of aspirin: Secondary | ICD-10-CM | POA: Diagnosis not present

## 2015-02-15 DIAGNOSIS — I429 Cardiomyopathy, unspecified: Secondary | ICD-10-CM | POA: Diagnosis not present

## 2015-02-15 DIAGNOSIS — Z79811 Long term (current) use of aromatase inhibitors: Secondary | ICD-10-CM | POA: Diagnosis not present

## 2015-02-15 DIAGNOSIS — F329 Major depressive disorder, single episode, unspecified: Secondary | ICD-10-CM | POA: Insufficient documentation

## 2015-02-15 DIAGNOSIS — Z9012 Acquired absence of left breast and nipple: Secondary | ICD-10-CM | POA: Insufficient documentation

## 2015-02-15 DIAGNOSIS — Z79899 Other long term (current) drug therapy: Secondary | ICD-10-CM | POA: Diagnosis not present

## 2015-02-15 DIAGNOSIS — Z87891 Personal history of nicotine dependence: Secondary | ICD-10-CM | POA: Insufficient documentation

## 2015-02-15 NOTE — Progress Notes (Signed)
[place]  Telephone:(336) [phone] Fax:(336) [phone]  ID: [name] OB: 1934-05-18  MR#: [id number]  FEO#:[id number]  Patient Care Team: Ellamae Sia, MD as PCP - General (Internal Medicine)  CHIEF COMPLAINT:  Chief Complaint  Patient presents with  . Breast Cancer    INTERVAL HISTORY: Patient returns to clinic today for routine 3 month evaluation. She did not have her mammogram in November 2016 because of transportation issues. She continues to tolerate letrozole well without significant side effects. Currently, she feels well and remains asymptomatic. She does not complain of pain today. She denies any neurologic complaints. She denies any fever, chills, or night sweats.  She has a good appetite and denies weight loss.  She denies any chest pain, shortness of breath, cough, or hemoptysis.  Patient offers no specific complaints today.  REVIEW OF SYSTEMS:   Review of Systems  Constitutional: Negative.   Respiratory: Negative.   Cardiovascular: Negative.   Gastrointestinal: Negative.   Musculoskeletal: Negative.   Neurological: Negative.   Endo/Heme/Allergies: Negative.     As per HPI. Otherwise, a complete review of systems is negatve.  PAST MEDICAL HISTORY: Past Medical History  Diagnosis Date  . Pacemaker 05/2007    [employer] ICD, Guidant lead, [facility] Dr Isaias Sakai  . HTN (hypertension)   . Other primary cardiomyopathies   . Atrial fibrillation     history of  . LV (left ventricular) mural thrombus     history of, resolved (echo 04/09)  . HLD (hyperlipidemia)   . COPD (chronic obstructive pulmonary disease)   . Colon adenocarcinoma   . History of depression   . DJD (degenerative joint disease) of knee     right knee  . H/O hysterectomy with oophorectomy     for DUB  . Anemia   . Depression   . Breast cancer     PAST SURGICAL HISTORY: Past Surgical History  Procedure Laterality Date  . Pacemaker insertion  2009  .  Replacement total knee  1990's    right   . Laparoscopic right colon resection  08/10    Dr Pat Patrick, for adenoca   . Mastectomy Left     FAMILY HISTORY Family History  Problem Relation Age of Onset  . Diabetes      brothers  . Hypertension Father   . Alzheimer's disease Mother   . Stroke Brother   . Heart failure Brother   . Breast cancer Sister   . Lupus Sister        ADVANCED DIRECTIVES:    HEALTH MAINTENANCE: Social History  Substance Use Topics  . Smoking status: Former Research scientist (life sciences)  . Smokeless tobacco: Never Used     Comment: quit 2006  . Alcohol Use: No     Comment: former     Colonoscopy:  PAP:  Bone density:  Lipid panel:  Allergies  Allergen Reactions  . No Known Allergies     Current Outpatient Prescriptions  Medication Sig Dispense Refill  . alendronate (FOSAMAX) 70 MG tablet TAKE ONE TABLET WEEKLY SAME DAY OF EACH WEEK--TAKE FIRST THING ON ARISING WITH A FULL GLASS OF WATER-STAY UP 30 MINUTES 4 tablet 5  . aspirin 81 MG tablet Take 81 mg by mouth daily.    . furosemide (LASIX) 40 MG tablet     . ipratropium-albuterol (DUONEB) 0.5-2.5 (3) MG/3ML SOLN Take by nebulization.    . isosorbide mononitrate (IMDUR) 30 MG 24 hr tablet Take 1 tablet by mouth Daily.    Marland Kitchen  KLOR-CON M20 20 MEQ tablet Take 1 tablet by mouth Daily.    Marland Kitchen letrozole (FEMARA) 2.5 MG tablet TAKE 1 TABLET EVERY DAY 30 tablet 5  . lisinopril (PRINIVIL,ZESTRIL) 10 MG tablet Take 1 tablet by mouth Daily.    Marland Kitchen lovastatin (MEVACOR) 40 MG tablet Take 2 tablets by mouth Daily.    . metoprolol succinate (TOPROL-XL) 50 MG 24 hr tablet     . MULTIPLE VITAMIN PO Take by mouth daily.    Marland Kitchen spironolactone (ALDACTONE) 25 MG tablet Take 1 tablet by mouth Daily.     No current facility-administered medications for this visit.    OBJECTIVE: Filed Vitals:   02/15/15 0931  BP: 133/83  Pulse: 60  Temp: 97.7 F (36.5 C)  Resp: 16     Body mass index is 31.56 kg/(m^2).    ECOG FS:0 -  Asymptomatic  General: Well-developed, well-nourished, no acute distress. Eyes: Pink conjunctiva, anicteric sclera. Breasts: Exam deferred today. Lungs: Clear to auscultation bilaterally. Heart: Regular rate and rhythm. No rubs, murmurs, or gallops. Abdomen: Soft, nontender, nondistended. No organomegaly noted, normoactive bowel sounds. Musculoskeletal: No edema, cyanosis, or clubbing. Neuro: Alert, answering all questions appropriately. Cranial nerves grossly intact. Skin: No rashes or petechiae noted. Psych: Normal affect.   LAB RESULTS:  Lab Results  Component Value Date   NA 136 12/13/2013   K 3.8 12/13/2013   CL 104 12/13/2013   CO2 28 12/13/2013   GLUCOSE 100* 12/13/2013   BUN 16 12/13/2013   CREATININE 1.01 12/13/2013   CALCIUM 9.7 12/13/2013   PROT 7.5 10/06/2013   ALBUMIN 3.7 10/06/2013   AST 23 10/06/2013   ALT 15 10/06/2013   ALKPHOS 55 10/06/2013   BILITOT 0.4 10/06/2013   GFRNONAA 56* 12/13/2013   GFRAA >60 12/13/2013    Lab Results  Component Value Date   WBC 6.0 12/13/2013   NEUTROABS 4.1 12/13/2013   HGB 12.7 12/13/2013   HCT 38.4 12/13/2013   MCV 95 12/13/2013   PLT 133* 12/13/2013     STUDIES: No results found.  ASSESSMENT: Pathologic stage IIIa adenocarcinoma of the left breast, ER/PR positive, HER-2 not overexpressing, status post neoadjuvant chemotherapy and mastectomy.  PLAN:    1. Breast cancer: Patient completed 4 cycles of neoadjuvant chemotherapy using Taxotere and Cytoxan on August 09, 2013. Despite this, left breast mastectomy revealed significant residual disease. Adjuvant XRT was recommended, but patient refused. She acknowledged that this would increase her risk of recurrence but she still declined. Continue letrozole completing in January 2021. She will have a mammogram and bone density in May 2017. Return to clinic in 3 months with repeat laboratory work and further evaluation. She requests to have all the tests and office visit in  May 2017 on the same day due to transportation issues.  2. Colon cancer: Colonoscopy in January of 2014 revealed no evidence of disease. Most recent CEA was within normal limits. 3. Osteoporosis: Bone mineral density on April 13, 2014 revealed a T score of -2.7. Continue Fosamax, calcium, and vitamin D. Repeat in May 2017. 4. Family history: Patient may benefit from genetic testing in the future.   Patient expressed understanding and was in agreement with this plan. She also understands that She can call clinic at any time with any questions, concerns, or complaints.   Breast cancer   Staging form: Breast, AJCC 7th Edition     Clinical stage from 08/01/2014: Stage IIIA (T1c, N2a, M0) - Signed by Lloyd Huger, MD on 08/01/2014  Mayra Reel, NP   02/15/2015 9:59 AM  Patient was seen and evaluated independently and I agree with the assessment and plan as dictated above.  Lloyd Huger, MD 02/16/2015 1:11 PM

## 2015-02-15 NOTE — Progress Notes (Signed)
Patient does not offer any problems today.  

## 2015-02-16 LAB — CANCER ANTIGEN 27.29: CA 27.29: 17.9 U/mL (ref 0.0–38.6)

## 2015-03-07 ENCOUNTER — Ambulatory Visit: Payer: 59

## 2015-03-07 ENCOUNTER — Inpatient Hospital Stay: Payer: Medicare Other | Admitting: Oncology

## 2015-03-07 ENCOUNTER — Inpatient Hospital Stay: Payer: Medicare Other

## 2015-05-15 ENCOUNTER — Other Ambulatory Visit: Payer: Medicare Other

## 2015-05-15 ENCOUNTER — Ambulatory Visit: Payer: Medicare Other | Admitting: Oncology

## 2015-05-15 ENCOUNTER — Ambulatory Visit: Payer: Medicare Other

## 2015-05-16 ENCOUNTER — Ambulatory Visit
Admission: RE | Admit: 2015-05-16 | Discharge: 2015-05-16 | Disposition: A | Discharge status: Home / Self Care | Payer: Medicare Other | Source: Ambulatory Visit | Attending: Oncology | Admitting: Oncology

## 2015-05-16 ENCOUNTER — Other Ambulatory Visit: Payer: Medicare Other

## 2015-05-16 ENCOUNTER — Other Ambulatory Visit: Payer: Self-pay | Admitting: Oncology

## 2015-05-16 ENCOUNTER — Ambulatory Visit: Payer: Medicare Other | Admitting: Oncology

## 2015-05-16 DIAGNOSIS — Z9012 Acquired absence of left breast and nipple: Secondary | ICD-10-CM | POA: Diagnosis not present

## 2015-05-16 DIAGNOSIS — C50912 Malignant neoplasm of unspecified site of left female breast: Secondary | ICD-10-CM

## 2015-05-16 DIAGNOSIS — Z13820 Encounter for screening for osteoporosis: Secondary | ICD-10-CM | POA: Diagnosis not present

## 2015-05-16 DIAGNOSIS — Z853 Personal history of malignant neoplasm of breast: Secondary | ICD-10-CM | POA: Diagnosis present

## 2015-05-16 DIAGNOSIS — Z1231 Encounter for screening mammogram for malignant neoplasm of breast: Secondary | ICD-10-CM | POA: Insufficient documentation

## 2015-05-16 DIAGNOSIS — M810 Age-related osteoporosis without current pathological fracture: Secondary | ICD-10-CM | POA: Insufficient documentation

## 2015-05-23 ENCOUNTER — Inpatient Hospital Stay: Payer: Medicare Other | Attending: Oncology

## 2015-05-23 ENCOUNTER — Inpatient Hospital Stay (HOSPITAL_BASED_OUTPATIENT_CLINIC_OR_DEPARTMENT_OTHER): Payer: Medicare Other | Admitting: Oncology

## 2015-05-23 VITALS — BP 124/79 | HR 80 | Temp 97.1°F | Resp 16

## 2015-05-23 DIAGNOSIS — Z9221 Personal history of antineoplastic chemotherapy: Secondary | ICD-10-CM

## 2015-05-23 DIAGNOSIS — E785 Hyperlipidemia, unspecified: Secondary | ICD-10-CM | POA: Insufficient documentation

## 2015-05-23 DIAGNOSIS — Z85038 Personal history of other malignant neoplasm of large intestine: Secondary | ICD-10-CM | POA: Insufficient documentation

## 2015-05-23 DIAGNOSIS — M810 Age-related osteoporosis without current pathological fracture: Secondary | ICD-10-CM | POA: Diagnosis not present

## 2015-05-23 DIAGNOSIS — Z7982 Long term (current) use of aspirin: Secondary | ICD-10-CM | POA: Diagnosis not present

## 2015-05-23 DIAGNOSIS — Z950 Presence of cardiac pacemaker: Secondary | ICD-10-CM | POA: Insufficient documentation

## 2015-05-23 DIAGNOSIS — C50919 Malignant neoplasm of unspecified site of unspecified female breast: Secondary | ICD-10-CM

## 2015-05-23 DIAGNOSIS — Z79811 Long term (current) use of aromatase inhibitors: Secondary | ICD-10-CM

## 2015-05-23 DIAGNOSIS — I429 Cardiomyopathy, unspecified: Secondary | ICD-10-CM | POA: Insufficient documentation

## 2015-05-23 DIAGNOSIS — C50912 Malignant neoplasm of unspecified site of left female breast: Secondary | ICD-10-CM

## 2015-05-23 DIAGNOSIS — I10 Essential (primary) hypertension: Secondary | ICD-10-CM | POA: Insufficient documentation

## 2015-05-23 DIAGNOSIS — Z170 Estrogen receptor positive status [ER+]: Secondary | ICD-10-CM | POA: Insufficient documentation

## 2015-05-23 DIAGNOSIS — F329 Major depressive disorder, single episode, unspecified: Secondary | ICD-10-CM | POA: Diagnosis not present

## 2015-05-23 DIAGNOSIS — Z9012 Acquired absence of left breast and nipple: Secondary | ICD-10-CM | POA: Diagnosis not present

## 2015-05-23 DIAGNOSIS — I4891 Unspecified atrial fibrillation: Secondary | ICD-10-CM | POA: Diagnosis not present

## 2015-05-23 DIAGNOSIS — Z79899 Other long term (current) drug therapy: Secondary | ICD-10-CM | POA: Insufficient documentation

## 2015-05-23 DIAGNOSIS — Z87891 Personal history of nicotine dependence: Secondary | ICD-10-CM | POA: Diagnosis not present

## 2015-05-23 DIAGNOSIS — J449 Chronic obstructive pulmonary disease, unspecified: Secondary | ICD-10-CM | POA: Insufficient documentation

## 2015-05-23 LAB — CBC WITH DIFFERENTIAL/PLATELET
BASOS ABS: 0.1 10*3/uL (ref 0–0.1)
BASOS PCT: 1 %
EOS ABS: 0 10*3/uL (ref 0–0.7)
Eosinophils Relative: 1 %
HCT: 37.8 % (ref 35.0–47.0)
HEMOGLOBIN: 13.2 g/dL (ref 12.0–16.0)
Lymphocytes Relative: 18 %
Lymphs Abs: 1 10*3/uL (ref 1.0–3.6)
MCH: 32.9 pg (ref 26.0–34.0)
MCHC: 34.9 g/dL (ref 32.0–36.0)
MCV: 94.3 fL (ref 80.0–100.0)
MONO ABS: 0.5 10*3/uL (ref 0.2–0.9)
MONOS PCT: 9 %
NEUTROS PCT: 71 %
Neutro Abs: 4.1 10*3/uL (ref 1.4–6.5)
Platelets: 200 10*3/uL (ref 150–440)
RBC: 4 MIL/uL (ref 3.80–5.20)
RDW: 14.4 % (ref 11.5–14.5)
WBC: 5.7 10*3/uL (ref 3.6–11.0)

## 2015-05-23 LAB — COMPREHENSIVE METABOLIC PANEL
ALBUMIN: 4 g/dL (ref 3.5–5.0)
ALT: 14 U/L (ref 14–54)
AST: 21 U/L (ref 15–41)
Alkaline Phosphatase: 39 U/L (ref 38–126)
Anion gap: 6 (ref 5–15)
BUN: 24 mg/dL — ABNORMAL HIGH (ref 6–20)
CALCIUM: 8.9 mg/dL (ref 8.9–10.3)
CO2: 27 mmol/L (ref 22–32)
Chloride: 99 mmol/L — ABNORMAL LOW (ref 101–111)
Creatinine, Ser: 1.03 mg/dL — ABNORMAL HIGH (ref 0.44–1.00)
GFR, EST AFRICAN AMERICAN: 59 mL/min — AB (ref >60)
GFR, EST NON AFRICAN AMERICAN: 51 mL/min — AB (ref >60)
Glucose, Bld: 116 mg/dL — ABNORMAL HIGH (ref 65–99)
Potassium: 3.8 mmol/L (ref 3.5–5.1)
SODIUM: 132 mmol/L — AB (ref 135–145)
TOTAL PROTEIN: 7.9 g/dL (ref 6.5–8.1)
Total Bilirubin: 0.7 mg/dL (ref 0.3–1.2)

## 2015-05-23 NOTE — Progress Notes (Signed)
[place]  Telephone:(336) [phone] Fax:(336) [phone]  ID: [name] OB: Sep 24, 1934  MR#: [id number]  SPQ#:[id number]  Patient Care Team: Ellamae Sia, MD as PCP - General (Internal Medicine)  CHIEF COMPLAINT:  Chief Complaint  Patient presents with  . Breast Cancer    INTERVAL HISTORY: Patient returns to clinic today for routine 3 month evaluation. She continues to tolerate letrozole well without side effects. Currently, she feels well and remains asymptomatic. She does not complain of pain today. She denies any neurologic complaints. She denies any fever, chills, or night sweats.  She has a good appetite and denies weight loss.  She denies any chest pain, shortness of breath, cough, or hemoptysis.  Patient offers no specific complaints today.  REVIEW OF SYSTEMS:   Review of Systems  Constitutional: Negative.   Respiratory: Negative.   Cardiovascular: Negative.   Gastrointestinal: Negative.   Musculoskeletal: Negative.   Neurological: Negative.   Endo/Heme/Allergies: Negative.     As per HPI. Otherwise, a complete review of systems is negatve.  PAST MEDICAL HISTORY: Past Medical History  Diagnosis Date  . Pacemaker 05/2007    [employer] ICD, Guidant lead, [facility] Dr Isaias Sakai  . HTN (hypertension)   . Other primary cardiomyopathies   . Atrial fibrillation ([place])     history of  . LV (left ventricular) mural thrombus (HCC)     history of, resolved (echo 04/09)  . HLD (hyperlipidemia)   . COPD (chronic obstructive pulmonary disease) ([place])   . History of depression   . DJD (degenerative joint disease) of knee     right knee  . H/O hysterectomy with oophorectomy     for DUB  . Anemia   . Depression   . Colon adenocarcinoma ([place])   . Breast cancer ([place])     PAST SURGICAL HISTORY: Past Surgical History  Procedure Laterality Date  . Pacemaker insertion  2009  . Replacement total knee  1990's    right   . Laparoscopic right colon  resection  08/10    Dr Pat Patrick, for adenoca   . Mastectomy Left   . Breast biopsy Left 05/10/13    [place] bx-positive  . Abdominal hysterectomy      FAMILY HISTORY Family History  Problem Relation Age of Onset  . Diabetes      brothers  . Hypertension Father   . Alzheimer's disease Mother   . Stroke Brother   . Heart failure Brother   . Breast cancer Sister 33  . Lupus Sister        ADVANCED DIRECTIVES:    HEALTH MAINTENANCE: Social History  Substance Use Topics  . Smoking status: Former Research scientist (life sciences)  . Smokeless tobacco: Never Used     Comment: quit 2006  . Alcohol Use: No     Comment: former     Allergies  Allergen Reactions  . No Known Allergies     Current Outpatient Prescriptions  Medication Sig Dispense Refill  . alendronate (FOSAMAX) 70 MG tablet TAKE ONE TABLET WEEKLY SAME DAY OF EACH WEEK--TAKE FIRST THING ON ARISING WITH A FULL GLASS OF WATER-STAY UP 30 MINUTES 4 tablet 5  . aspirin 81 MG tablet Take 81 mg by mouth daily.    . furosemide (LASIX) 40 MG tablet     . ipratropium-albuterol (DUONEB) 0.5-2.5 (3) MG/3ML SOLN Take by nebulization.    . isosorbide mononitrate (IMDUR) 30 MG 24 hr tablet Take 1 tablet by mouth Daily.    Marland Kitchen KLOR-CON  M20 20 MEQ tablet Take 1 tablet by mouth Daily.    Marland Kitchen letrozole (FEMARA) 2.5 MG tablet TAKE 1 TABLET EVERY DAY 30 tablet 5  . lisinopril (PRINIVIL,ZESTRIL) 10 MG tablet Take 1 tablet by mouth Daily.    Marland Kitchen lovastatin (MEVACOR) 40 MG tablet Take 2 tablets by mouth Daily.    . metoprolol succinate (TOPROL-XL) 50 MG 24 hr tablet     . MULTIPLE VITAMIN PO Take by mouth daily.    Marland Kitchen spironolactone (ALDACTONE) 25 MG tablet Take 1 tablet by mouth Daily.     No current facility-administered medications for this visit.    OBJECTIVE: Filed Vitals:   05/23/15 1111  BP: 124/79  Pulse: 80  Temp: 97.1 F (36.2 C)  Resp: 16     There is no weight on file to calculate BMI.    ECOG FS:0 - Asymptomatic  General: Well-developed, well-nourished,  no acute distress. Sitting in wheelchair Eyes: Pink conjunctiva, anicteric sclera. Breasts: Exam deferred today. Lungs: Clear to auscultation bilaterally. Heart: Regular rate and rhythm. No rubs, murmurs, or gallops. Abdomen: Soft, nontender, nondistended. No organomegaly noted, normoactive bowel sounds. Musculoskeletal: No edema, cyanosis, or clubbing. Neuro: Alert, answering all questions appropriately. Cranial nerves grossly intact. Skin: No rashes or petechiae noted. Psych: Normal affect.   LAB RESULTS:  Lab Results  Component Value Date   NA 132* 05/23/2015   K 3.8 05/23/2015   CL 99* 05/23/2015   CO2 27 05/23/2015   GLUCOSE 116* 05/23/2015   BUN 24* 05/23/2015   CREATININE 1.03* 05/23/2015   CALCIUM 8.9 05/23/2015   PROT 7.9 05/23/2015   ALBUMIN 4.0 05/23/2015   AST 21 05/23/2015   ALT 14 05/23/2015   ALKPHOS 39 05/23/2015   BILITOT 0.7 05/23/2015   GFRNONAA 51* 05/23/2015   GFRAA 59* 05/23/2015    Lab Results  Component Value Date   WBC 5.7 05/23/2015   NEUTROABS 4.1 05/23/2015   HGB 13.2 05/23/2015   HCT 37.8 05/23/2015   MCV 94.3 05/23/2015   PLT 200 05/23/2015   Lab Results  Component Value Date   LABCA2 23.5 05/23/2015     STUDIES: Dg Bone Density  05/16/2015  EXAM: DUAL X-RAY ABSORPTIOMETRY (DXA) FOR BONE MINERAL DENSITY IMPRESSION: Dear Dr. Mayra Reel, Your patient Hitomi Slape completed a BMD test on 05/16/2015 using the [place] (analysis version: 14.10) manufactured by [employer]. The following summarizes the results of our evaluation. PATIENT BIOGRAPHICAL: Name: Adi, Seales Patient ID: [id number] Birth Date: [birth year] Height: 62.0 in. Gender: Female Exam Date: 05/16/2015 Weight: 171.4 lbs. Indications: Height Loss, Early Menopause, Hysterectomy, Postmenopausal, Oopherectomy Bilateral, Advanced Age, History of Breast Cancer Fractures: Treatments: femara, Fosamax D ASSESSMENT: The BMD measured at Forearm Radius 33% is 0.627 g/cm2  with a T-score of -2.8. This patient is considered osteoporotic according to [facility]) criteria. Lumbar spine was not utilized due to advanced degenerative changes. Site Region Measured Measured WHO Young Adult BMD Date       Age      Classification T-score DualFemur Neck Right 05/16/2015 78.8 Normal -0.6 0.958 g/cm2 DualFemur Neck Right 04/13/2014 77.7 Osteopenia -1.1 0.888 g/cm2 Left Forearm Radius 33% 05/16/2015 78.8 Osteoporosis -2.8 0.627 g/cm2 Left Forearm Radius 33% 04/13/2014 77.7 Osteoporosis -2.7 0.640 g/cm2 World Health Organization [facility]) criteria for post-menopausal, Caucasian Women: Normal:       T-score at or above -1 SD Osteopenia:   T-score between -1 and -2.5 SD Osteoporosis: T-score at or below -2.5 SD  RECOMMENDATIONS: National Osteoporosis Foundation recommends that FDA-approved medical therapies be considered in postmenopausal women and men age 74 or older with a: 1. Hip or vertebral (clinical or morphometric) fracture. 2. T-score of < -2.5 at the spine or hip. 3. Ten-year fracture probability by FRAX of 3% or greater for hip fracture or 20% or greater for major osteoporotic fracture. All treatment decisions require clinical judgment and consideration of individual patient factors, including patient preferences, co-morbidities, previous drug use, risk factors not captured in the FRAX model (e.g. falls, vitamin D deficiency, increased bone turnover, interval significant decline in bone density) and possible under - or over-estimation of fracture risk by FRAX. All patients should ensure an adequate intake of dietary calcium (1200 mg/d) and vitamin D (800 IU daily) unless contraindicated. FOLLOW-UP: People with diagnosed cases of osteoporosis or at high risk for fracture should have regular bone mineral density tests. For patients eligible for Medicare, routine testing is allowed once every 2 years. The testing frequency can be increased to one year for patients who have  rapidly progressing disease, those who are receiving or discontinuing medical therapy to restore bone mass, or have additional risk factors. I have reviewed this report, and agree with the above findings. [facility] Radiology Electronically Signed   By: Rolm Baptise M.D.   On: 05/16/2015 10:25   Mm Screening Breast Tomo Uni R  05/16/2015  CLINICAL DATA:  Screening. EXAM: 2D DIGITAL SCREENING UNILATERAL RIGHT MAMMOGRAM WITH CAD AND ADJUNCT TOMO COMPARISON:  Previous exam(s). ACR Breast Density Category b: There are scattered areas of fibroglandular density. FINDINGS: The patient has had a left mastectomy. There are no findings suspicious for malignancy. Images were processed with CAD. IMPRESSION: No mammographic evidence of malignancy. A result letter of this screening mammogram will be mailed directly to the patient. RECOMMENDATION: Screening mammogram in one year.  (Code:SM-R-38M) BI-RADS CATEGORY  1: Negative. Electronically Signed   By: Pamelia Hoit M.D.   On: 05/16/2015 13:36    ASSESSMENT: Pathologic stage IIIa adenocarcinoma of the left breast, ER/PR positive, HER-2 not overexpressing, status post neoadjuvant chemotherapy and mastectomy.  PLAN:    1. Breast cancer: Patient completed 4 cycles of neoadjuvant chemotherapy using Taxotere and Cytoxan on August 09, 2013. Despite this, left breast mastectomy revealed significant residual disease. Adjuvant XRT was recommended, but patient refused. She acknowledged that this would increase her risk of recurrence but she still declined. Continue letrozole completing in January 2021. CA 27.29 is WNL.  Mammogram on May 16, 2015 reports BIRADS-1, repeat in one year. Return to clinic in 3 months for further evaluation.  2. Osteoporosis: Bone mineral density on May 16, 2015 revealed a T score of -2.8. Continue Fosamax, calcium, and vitamin D. Repeat in May 2018. 3. Family history: Patient may benefit from genetic testing in the future.   Patient expressed  understanding and was in agreement with this plan. She also understands that She can call clinic at any time with any questions, concerns, or complaints.   Breast cancer   Staging form: Breast, AJCC 7th Edition     Clinical stage from 08/01/2014: Stage IIIA (T1c, N2a, M0) - Signed by Lloyd Huger, MD on 08/01/2014   Mayra Reel, NP   05/23/2015 11:21 AM  Patient was seen and evaluated independently and I agree with the assessment and plan as dictated above.  Lloyd Huger, MD 05/25/2015 1:37 PM

## 2015-05-23 NOTE — Progress Notes (Signed)
Patient does not offer any problems today.  

## 2015-05-24 LAB — CANCER ANTIGEN 27.29: CA 27.29: 23.5 U/mL (ref 0.0–38.6)

## 2015-06-09 ENCOUNTER — Other Ambulatory Visit: Payer: Self-pay | Admitting: Oncology

## 2015-08-06 ENCOUNTER — Other Ambulatory Visit: Payer: Self-pay | Admitting: Oncology

## 2015-08-21 NOTE — Progress Notes (Deleted)
[place]  Telephone:(336) [phone] Fax:(336) [phone]  ID: [name] OB: 1934-02-22  MR#: [id number]  ALP#:[id number]  Patient Care Team: Ellamae Sia, MD as PCP - General (Internal Medicine)  CHIEF COMPLAINT: Pathologic stage IIIa adenocarcinoma of upper outer quadrant of the left breast, ER/PR positive, HER-2 not overexpressing  INTERVAL HISTORY: Patient returns to clinic today for routine 3 month evaluation. She continues to tolerate letrozole well without side effects. Currently, she feels well and remains asymptomatic. She does not complain of pain today. She denies any neurologic complaints. She denies any fever, chills, or night sweats.  She has a good appetite and denies weight loss.  She denies any chest pain, shortness of breath, cough, or hemoptysis.  Patient offers no specific complaints today.  REVIEW OF SYSTEMS:   Review of Systems  Constitutional: Negative.   Respiratory: Negative.   Cardiovascular: Negative.   Gastrointestinal: Negative.   Musculoskeletal: Negative.   Neurological: Negative.   Endo/Heme/Allergies: Negative.     As per HPI. Otherwise, a complete review of systems is negatve.  PAST MEDICAL HISTORY: Past Medical History:  Diagnosis Date  . Anemia   . Atrial fibrillation ([place])    history of  . Breast cancer ([place])   . Colon adenocarcinoma ([place])   . COPD (chronic obstructive pulmonary disease) ([place])   . Depression   . DJD (degenerative joint disease) of knee    right knee  . H/O hysterectomy with oophorectomy    for DUB  . History of depression   . HLD (hyperlipidemia)   . HTN (hypertension)   . LV (left ventricular) mural thrombus (HCC)    history of, resolved (echo 04/09)  . Other primary cardiomyopathies   . Pacemaker 05/2007   [place], Guidant lead, [facility] Dr Isaias Sakai    PAST SURGICAL HISTORY: Past Surgical History:  Procedure Laterality Date  . ABDOMINAL HYSTERECTOMY    . BREAST BIOPSY  Left 05/10/13   [place] bx-positive  . LAPAROSCOPIC RIGHT COLON RESECTION  08/10   Dr Pat Patrick, for adenoca   . MASTECTOMY Left   . PACEMAKER INSERTION  2009  . REPLACEMENT TOTAL KNEE  1990's   right     FAMILY HISTORY Family History  Problem Relation Age of Onset  . Diabetes      brothers  . Hypertension Father   . Alzheimer's disease Mother   . Stroke Brother   . Heart failure Brother   . Breast cancer Sister 18  . Lupus Sister        ADVANCED DIRECTIVES:    HEALTH MAINTENANCE: Social History  Substance Use Topics  . Smoking status: Former Research scientist (life sciences)  . Smokeless tobacco: Never Used     Comment: quit 2006  . Alcohol use No     Comment: former     Allergies  Allergen Reactions  . No Known Allergies     Current Outpatient Prescriptions  Medication Sig Dispense Refill  . alendronate (FOSAMAX) 70 MG tablet TAKE ONE TABLET WEEKLY SAME DAY OF EACH WEEK--TAKE FIRST THING ON ARISING WITH A FULL GLASS OF WATER-STAY UP 30 MINUTES 4 tablet 5  . aspirin 81 MG tablet Take 81 mg by mouth daily.    . furosemide (LASIX) 40 MG tablet     . ipratropium-albuterol (DUONEB) 0.5-2.5 (3) MG/3ML SOLN Take by nebulization.    . isosorbide mononitrate (IMDUR) 30 MG 24 hr tablet Take 1 tablet by mouth Daily.    Marland Kitchen KLOR-CON M20 20 MEQ tablet  Take 1 tablet by mouth Daily.    Marland Kitchen letrozole (FEMARA) 2.5 MG tablet TAKE 1 TABLET EVERY DAY 30 tablet 5  . lisinopril (PRINIVIL,ZESTRIL) 10 MG tablet Take 1 tablet by mouth Daily.    Marland Kitchen lovastatin (MEVACOR) 40 MG tablet Take 2 tablets by mouth Daily.    . metoprolol succinate (TOPROL-XL) 50 MG 24 hr tablet     . MULTIPLE VITAMIN PO Take by mouth daily.    Marland Kitchen spironolactone (ALDACTONE) 25 MG tablet Take 1 tablet by mouth Daily.     No current facility-administered medications for this visit.     OBJECTIVE: There were no vitals filed for this visit.   There is no height or weight on file to calculate BMI.    ECOG FS:0 - Asymptomatic  General: Well-developed,  well-nourished, no acute distress. Sitting in wheelchair Eyes: Pink conjunctiva, anicteric sclera. Breasts: Exam deferred today. Lungs: Clear to auscultation bilaterally. Heart: Regular rate and rhythm. No rubs, murmurs, or gallops. Abdomen: Soft, nontender, nondistended. No organomegaly noted, normoactive bowel sounds. Musculoskeletal: No edema, cyanosis, or clubbing. Neuro: Alert, answering all questions appropriately. Cranial nerves grossly intact. Skin: No rashes or petechiae noted. Psych: Normal affect.   LAB RESULTS:  Lab Results  Component Value Date   NA 132 (L) 05/23/2015   K 3.8 05/23/2015   CL 99 (L) 05/23/2015   CO2 27 05/23/2015   GLUCOSE 116 (H) 05/23/2015   BUN 24 (H) 05/23/2015   CREATININE 1.03 (H) 05/23/2015   CALCIUM 8.9 05/23/2015   PROT 7.9 05/23/2015   ALBUMIN 4.0 05/23/2015   AST 21 05/23/2015   ALT 14 05/23/2015   ALKPHOS 39 05/23/2015   BILITOT 0.7 05/23/2015   GFRNONAA 51 (L) 05/23/2015   GFRAA 59 (L) 05/23/2015    Lab Results  Component Value Date   WBC 5.7 05/23/2015   NEUTROABS 4.1 05/23/2015   HGB 13.2 05/23/2015   HCT 37.8 05/23/2015   MCV 94.3 05/23/2015   PLT 200 05/23/2015   Lab Results  Component Value Date   LABCA2 23.5 05/23/2015     STUDIES: No results found.  ASSESSMENT: Pathologic stage IIIa adenocarcinoma of upper outer quadrant of the left breast, ER/PR positive, HER-2 not overexpressing, status post neoadjuvant chemotherapy and mastectomy.  PLAN:    1. Pathologic stage IIIa adenocarcinoma of upper outer quadrant of the left breast, ER/PR positive, HER-2 not overexpressing: Patient completed 4 cycles of neoadjuvant chemotherapy using Taxotere and Cytoxan on August 09, 2013. Despite this, left breast mastectomy revealed significant residual disease. Adjuvant XRT was recommended, but patient refused. She acknowledged that this would increase her risk of recurrence but she still declined. Continue letrozole completing in  January 2021. CA 27.29 is WNL.  Mammogram on May 16, 2015 reports BIRADS-1, repeat in one year. Return to clinic in 3 months for further evaluation.  2. Osteoporosis: Bone mineral density on May 16, 2015 revealed a T score of -2.8. Continue Fosamax, calcium, and vitamin D. Repeat in May 2018. 3. Family history: Patient may benefit from genetic testing in the future.   Patient expressed understanding and was in agreement with this plan. She also understands that She can call clinic at any time with any questions, concerns, or complaints.   Breast cancer   Staging form: Breast, AJCC 7th Edition     Clinical stage from 08/01/2014: Stage IIIA (T1c, N2a, M0) - Signed by Lloyd Huger, MD on 08/01/2014   Lloyd Huger, MD   08/21/2015 11:33 PM

## 2015-08-23 ENCOUNTER — Other Ambulatory Visit: Payer: Medicare Other

## 2015-08-23 ENCOUNTER — Ambulatory Visit: Payer: Medicare Other | Admitting: Oncology

## 2015-08-23 ENCOUNTER — Inpatient Hospital Stay: Payer: Medicare Other | Admitting: Oncology

## 2015-09-13 ENCOUNTER — Ambulatory Visit: Payer: Medicare Other | Admitting: Oncology

## 2015-09-24 NOTE — Progress Notes (Signed)
[place]  Telephone:(336) [phone] Fax:(336) [phone]  ID: Julia Gordon OB: 1934/03/10  MR#: [id number]  XBM#:[id number]  Patient Care Team: Ellamae Sia, MD as PCP - General (Internal Medicine)  CHIEF COMPLAINT: Pathologic stage IIIa adenocarcinoma of the upper outer quadrant of left breast, ER/PR positive, HER-2 not overexpressing, status post neoadjuvant chemotherapy and mastectomy.  INTERVAL HISTORY: Patient returns to clinic today for routine 3 month evaluation. She continues to tolerate letrozole and Fosamax well without side effects. Currently, she feels well and remains asymptomatic. She does not complain of pain today. She denies any neurologic complaints. She denies any fever, chills, or night sweats.  She has a good appetite and denies weight loss.  She denies any chest pain, shortness of breath, cough, or hemoptysis.  Patient offers no specific complaints today.  REVIEW OF SYSTEMS:   Review of Systems  Constitutional: Negative.  Negative for fever, malaise/fatigue and weight loss.  Respiratory: Negative.  Negative for cough and shortness of breath.   Cardiovascular: Negative.  Negative for chest pain.  Gastrointestinal: Negative.  Negative for abdominal pain.  Musculoskeletal: Negative.   Neurological: Negative.  Negative for weakness.  Endo/Heme/Allergies: Negative.   Psychiatric/Behavioral: The patient is not nervous/anxious.     As per HPI. Otherwise, a complete review of systems is negative.  PAST MEDICAL HISTORY: Past Medical History:  Diagnosis Date  . Anemia   . Atrial fibrillation ([place])    history of  . Breast cancer ([place])   . Colon adenocarcinoma ([place])   . COPD (chronic obstructive pulmonary disease) ([place])   . Depression   . DJD (degenerative joint disease) of knee    right knee  . H/O hysterectomy with oophorectomy    for DUB  . History of depression   . HLD (hyperlipidemia)   . HTN (hypertension)   . LV (left ventricular)  mural thrombus ([facility])    history of, resolved (echo 04/09)  . Other primary cardiomyopathies   . Pacemaker 05/2007   [place], Guidant lead, [facility] Dr Isaias Sakai    PAST SURGICAL HISTORY: Past Surgical History:  Procedure Laterality Date  . ABDOMINAL HYSTERECTOMY    . BREAST BIOPSY Left 05/10/13   [place] bx-positive  . LAPAROSCOPIC RIGHT COLON RESECTION  08/10   Dr Pat Patrick, for adenoca   . MASTECTOMY Left   . PACEMAKER INSERTION  2009  . REPLACEMENT TOTAL KNEE  1990's   right     FAMILY HISTORY Family History  Problem Relation Age of Onset  . Diabetes      brothers  . Hypertension Father   . Alzheimer's disease Mother   . Stroke Brother   . Heart failure Brother   . Breast cancer Sister 53  . Lupus Sister        ADVANCED DIRECTIVES:    HEALTH MAINTENANCE: Social History  Substance Use Topics  . Smoking status: Former Research scientist (life sciences)  . Smokeless tobacco: Never Used     Comment: quit 2006  . Alcohol use No     Comment: former     Allergies  Allergen Reactions  . No Known Allergies     Current Outpatient Prescriptions  Medication Sig Dispense Refill  . alendronate (FOSAMAX) 70 MG tablet TAKE ONE TABLET WEEKLY SAME DAY OF EACH WEEK--TAKE FIRST THING ON ARISING WITH A FULL GLASS OF WATER-STAY UP 30 MINUTES 4 tablet 5  . aspirin 81 MG tablet Take 81 mg by mouth daily.    . furosemide (LASIX) 40 MG  tablet     . ipratropium-albuterol (DUONEB) 0.5-2.5 (3) MG/3ML SOLN Take by nebulization.    . isosorbide mononitrate (IMDUR) 30 MG 24 hr tablet Take 1 tablet by mouth Daily.    Marland Kitchen KLOR-CON M20 20 MEQ tablet Take 1 tablet by mouth Daily.    Marland Kitchen letrozole (FEMARA) 2.5 MG tablet TAKE 1 TABLET EVERY DAY 30 tablet 5  . lisinopril (PRINIVIL,ZESTRIL) 10 MG tablet Take 1 tablet by mouth Daily.    Marland Kitchen lovastatin (MEVACOR) 40 MG tablet Take 2 tablets by mouth Daily.    . metoprolol succinate (TOPROL-XL) 50 MG 24 hr tablet     . MULTIPLE VITAMIN PO Take by mouth daily.    Marland Kitchen  spironolactone (ALDACTONE) 25 MG tablet Take 1 tablet by mouth Daily.     No current facility-administered medications for this visit.     OBJECTIVE: Vitals:   09/25/15 1120  BP: (!) 145/86  Pulse: 61  Resp: 18  Temp: 97 F (36.1 C)     Body mass index is 31.19 kg/m.    ECOG FS:0 - Asymptomatic  General: Well-developed, well-nourished, no acute distress. Sitting in wheelchair Eyes: Pink conjunctiva, anicteric sclera. Breasts: Left chest wall without evidence of recurrence. Lungs: Clear to auscultation bilaterally. Heart: Regular rate and rhythm. No rubs, murmurs, or gallops. Abdomen: Soft, nontender, nondistended. No organomegaly noted, normoactive bowel sounds. Musculoskeletal: No edema, cyanosis, or clubbing. Neuro: Alert, answering all questions appropriately. Cranial nerves grossly intact. Skin: No rashes or petechiae noted. Psych: Normal affect.   LAB RESULTS:  Lab Results  Component Value Date   NA 132 (L) 05/23/2015   K 3.8 05/23/2015   CL 99 (L) 05/23/2015   CO2 27 05/23/2015   GLUCOSE 116 (H) 05/23/2015   BUN 24 (H) 05/23/2015   CREATININE 1.03 (H) 05/23/2015   CALCIUM 8.9 05/23/2015   PROT 7.9 05/23/2015   ALBUMIN 4.0 05/23/2015   AST 21 05/23/2015   ALT 14 05/23/2015   ALKPHOS 39 05/23/2015   BILITOT 0.7 05/23/2015   GFRNONAA 51 (L) 05/23/2015   GFRAA 59 (L) 05/23/2015    Lab Results  Component Value Date   WBC 5.7 05/23/2015   NEUTROABS 4.1 05/23/2015   HGB 13.2 05/23/2015   HCT 37.8 05/23/2015   MCV 94.3 05/23/2015   PLT 200 05/23/2015   Lab Results  Component Value Date   LABCA2 23.5 05/23/2015     STUDIES: No results found.  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  PLAN:    1. Pathologic stage IIIa adenocarcinoma of the upper inner quadrant of left breast, ER/PR positive, HER-2 not  overexpressing, status post neoadjuvant chemotherapy and mastectomy: Patient completed 4 cycles of neoadjuvant chemotherapy using Taxotere and Cytoxan on August 09, 2013. Despite this, left breast mastectomy revealed significant residual disease. Patient underwent total mastectomy on December 13, 2013.  Adjuvant XRT was recommended, but patient refused. She acknowledged that this would increase her risk of recurrence but she still declined. Continue letrozole completing in January 2021. CA-27-29 continues to be within normal limits.  Right breast screening mammogram on May 16, 2015 reported BIRADS-1, repeat in one year. Return to clinic in 6 months for further evaluation.  2. Osteoporosis: Bone mineral density on May 16, 2015 revealed a T score of -2.8. Continue Fosamax, calcium, and vitamin D. Repeat in May 2018. 3. Family history: Patient may benefit from genetic testing in  the future. 4. Hypertension: Patient's blood pressure is mildly elevated today. Continue current medications as prescribed.   Patient expressed understanding and was in agreement with this plan. She also understands that She can call clinic at any time with any questions, concerns, or complaints.   Breast cancer   Staging form: Breast, AJCC 7th Edition     Clinical stage from 08/01/2014: Stage IIIA (T1c, N2a, M0) - Signed by Lloyd Huger, MD on 08/01/2014   Lloyd Huger, MD   09/25/2015 11:55 AM

## 2015-09-25 ENCOUNTER — Inpatient Hospital Stay: Payer: Medicare Other

## 2015-09-25 ENCOUNTER — Inpatient Hospital Stay: Payer: Medicare Other | Attending: Oncology | Admitting: Oncology

## 2015-09-25 VITALS — BP 145/86 | HR 61 | Temp 97.0°F | Resp 18 | Wt 170.5 lb

## 2015-09-25 DIAGNOSIS — Z170 Estrogen receptor positive status [ER+]: Secondary | ICD-10-CM | POA: Diagnosis not present

## 2015-09-25 DIAGNOSIS — I10 Essential (primary) hypertension: Secondary | ICD-10-CM

## 2015-09-25 DIAGNOSIS — C50212 Malignant neoplasm of upper-inner quadrant of left female breast: Secondary | ICD-10-CM | POA: Diagnosis not present

## 2015-09-25 DIAGNOSIS — M818 Other osteoporosis without current pathological fracture: Secondary | ICD-10-CM

## 2015-09-25 DIAGNOSIS — Z9221 Personal history of antineoplastic chemotherapy: Secondary | ICD-10-CM | POA: Diagnosis not present

## 2015-09-25 DIAGNOSIS — M1711 Unilateral primary osteoarthritis, right knee: Secondary | ICD-10-CM | POA: Insufficient documentation

## 2015-09-25 DIAGNOSIS — Z79899 Other long term (current) drug therapy: Secondary | ICD-10-CM | POA: Diagnosis not present

## 2015-09-25 DIAGNOSIS — I4891 Unspecified atrial fibrillation: Secondary | ICD-10-CM | POA: Diagnosis not present

## 2015-09-25 DIAGNOSIS — Z9012 Acquired absence of left breast and nipple: Secondary | ICD-10-CM | POA: Diagnosis not present

## 2015-09-25 DIAGNOSIS — Z950 Presence of cardiac pacemaker: Secondary | ICD-10-CM | POA: Insufficient documentation

## 2015-09-25 DIAGNOSIS — J449 Chronic obstructive pulmonary disease, unspecified: Secondary | ICD-10-CM | POA: Insufficient documentation

## 2015-09-25 DIAGNOSIS — E785 Hyperlipidemia, unspecified: Secondary | ICD-10-CM | POA: Diagnosis not present

## 2015-09-25 DIAGNOSIS — Z79811 Long term (current) use of aromatase inhibitors: Secondary | ICD-10-CM | POA: Insufficient documentation

## 2015-09-25 NOTE — Progress Notes (Signed)
States is feeling well. Offers no complaints. 

## 2015-12-07 ENCOUNTER — Other Ambulatory Visit: Payer: Self-pay | Admitting: Oncology

## 2016-02-06 ENCOUNTER — Other Ambulatory Visit: Payer: Self-pay | Admitting: Oncology

## 2016-03-21 ENCOUNTER — Other Ambulatory Visit: Payer: Self-pay

## 2016-03-21 DIAGNOSIS — C50212 Malignant neoplasm of upper-inner quadrant of left female breast: Secondary | ICD-10-CM

## 2016-03-23 NOTE — Progress Notes (Signed)
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  INTERVAL HISTORY: Patient returns to clinic today for routine 3 month evaluation. She continues to tolerate letrozole and Fosamax well without side effects. She does complain that her letrozole or fosamax has been making her sleepy although she is taking it at night. Otherwise, she feels well and remains asymptomatic. She does not complain of pain today. She denies any neurologic complaints. She denies any fever, chills, or night sweats.  She has a good appetite and denies weight loss.  She denies any chest pain, shortness of breath, cough, or hemoptysis.  Patient offers no specific complaints today.  REVIEW OF SYSTEMS:   Review of Systems  Constitutional: Negative.  Negative for fever, malaise/fatigue and weight loss.  Respiratory: Negative.  Negative for cough and shortness of breath.   Cardiovascular: Negative.  Negative for chest pain.  Gastrointestinal: Negative.  Negative for abdominal pain.  Musculoskeletal: Negative.   Neurological: Negative.  Negative for weakness.  Endo/Heme/Allergies: Negative.   Psychiatric/Behavioral: The patient is not nervous/anxious.     As per HPI. Otherwise, a complete review of systems is negative.  PAST MEDICAL HISTORY: Past Medical History:  Diagnosis Date  . Anemia   . Atrial fibrillation ([place])    history of  . Breast cancer ([place])   . 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 COPD (chronic obstructive pulmonary disease) ([place])   . Depression   . DJD (degenerative joint disease) of knee    right knee  . H/O hysterectomy with oophorectomy    for  DUB  . History of depression   . HLD (hyperlipidemia)   . HTN (hypertension)   . LV (left ventricular) mural thrombus ([facility])    history of, resolved (echo 04/09)  . Other primary cardiomyopathies ([place])   . Pacemaker 05/2007   [place], Guidant lead, [facility] Dr Isaias Sakai    PAST SURGICAL HISTORY: Past Surgical History:  Procedure Laterality Date  . ABDOMINAL HYSTERECTOMY    . BREAST BIOPSY Left 05/10/13   [place] bx-positive  . LAPAROSCOPIC RIGHT COLON RESECTION  08/10   Dr Pat Patrick, for adenoca   . MASTECTOMY Left   . PACEMAKER INSERTION  2009  . REPLACEMENT TOTAL KNEE  1990's   right     FAMILY HISTORY Family History  Problem Relation Age of Onset  . Diabetes      brothers  . Hypertension Father   . Alzheimer's disease Mother   . Stroke Brother   . Heart failure Brother   . Breast cancer Sister 23  . Lupus Sister        ADVANCED DIRECTIVES:    HEALTH MAINTENANCE: Social History  Substance Use Topics  . Smoking status: Former Research scientist (life sciences)  . Smokeless tobacco: Never Used     Comment: quit 2006  . Alcohol use No     Comment: former     Allergies  Allergen Reactions  . No Known Allergies     Current Outpatient Prescriptions  Medication Sig Dispense Refill  . alendronate (FOSAMAX) 70 MG tablet TAKE ONE TABLET WEEKLY SAME DAY OF EACH WEEK--TAKE FIRST THING ON ARISING WITH A FULL GLASS OF WATER-STAY UP 30 MINUTES 4 tablet  3  . aspirin 81 MG tablet Take 81 mg by mouth daily.    . furosemide (LASIX) 40 MG tablet Take 40 mg by mouth daily.     . isosorbide mononitrate (IMDUR) 30 MG 24 hr tablet Take 1 tablet by mouth Daily.    Marland Kitchen KLOR-CON M20 20 MEQ tablet Take 1 tablet by mouth Daily.    Marland Kitchen letrozole (FEMARA) 2.5 MG tablet TAKE 1 TABLET BY MOUTH DAILY 30 tablet 1  . lisinopril (PRINIVIL,ZESTRIL) 10 MG tablet Take 1 tablet by mouth Daily.    Marland Kitchen lovastatin (MEVACOR) 40 MG tablet Take 2 tablets by mouth Daily.    . metoprolol succinate (TOPROL-XL) 50 MG 24 hr tablet  Take 50 mg by mouth daily.     . MULTIPLE VITAMIN PO Take by mouth daily.    Marland Kitchen spironolactone (ALDACTONE) 25 MG tablet Take 1 tablet by mouth Daily.     No current facility-administered medications for this visit.     OBJECTIVE: Vitals:   03/24/16 1041  BP: (!) 143/85  Pulse: (!) 48  Resp: 18  Temp: (!) 96.3 F (35.7 C)     Body mass index is 31.29 kg/m.    ECOG FS:0 - Asymptomatic  General: Well-developed, well-nourished, no acute distress. Sitting in wheelchair Eyes: Pink conjunctiva, anicteric sclera. Breasts: Left chest wall without evidence of recurrence. Lungs: Clear to auscultation bilaterally. Heart: Regular rate and rhythm. No rubs, murmurs, or gallops. Abdomen: Soft, nontender, nondistended. No organomegaly noted, normoactive bowel sounds. Musculoskeletal: No edema, cyanosis, or clubbing. Neuro: Alert, answering all questions appropriately. Cranial nerves grossly intact. Skin: No rashes or petechiae noted. Psych: Normal affect.   LAB RESULTS:  Lab Results  Component Value Date   NA 134 (L) 03/24/2016   K 4.0 03/24/2016   CL 101 03/24/2016   CO2 29 03/24/2016   GLUCOSE 93 03/24/2016   BUN 29 (H) 03/24/2016   CREATININE 1.06 (H) 03/24/2016   CALCIUM 9.3 03/24/2016   PROT 7.6 03/24/2016   ALBUMIN 4.1 03/24/2016   AST 21 03/24/2016   ALT 14 03/24/2016   ALKPHOS 32 (L) 03/24/2016   BILITOT 0.5 03/24/2016   GFRNONAA 49 (L) 03/24/2016   GFRAA 56 (L) 03/24/2016    Lab Results  Component Value Date   WBC 5.1 03/24/2016   NEUTROABS 3.0 03/24/2016   HGB 13.2 03/24/2016   HCT 38.5 03/24/2016   MCV 95.2 03/24/2016   PLT 173 03/24/2016   Lab Results  Component Value Date   LABCA2 24.4 03/24/2016     STUDIES: No results found.  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  PLAN:    1. Pathologic stage IIIa  adenocarcinoma of the upper inner quadrant of left breast, ER/PR positive, HER-2 not overexpressing, status post neoadjuvant chemotherapy and mastectomy: Patient completed 4 cycles of neoadjuvant chemotherapy using Taxotere and Cytoxan on August 09, 2013. Despite this, left breast mastectomy revealed significant residual disease. Patient underwent total mastectomy on December 13, 2013.  Adjuvant XRT was recommended, but patient refused. She acknowledged that this would increase her risk of recurrence but she still declined. Continue letrozole completing in January 2021. CA-27-29 continues to be within normal limits.  Right breast screening mammogram last completed on May 16, 2015 reported BIRADS-1. Repeat in May 2018. Return to clinic in 3 months for further evaluation.  2. Osteoporosis: Bone mineral density on May 16, 2015 revealed a  T score of -2.8. Continue Fosamax, calcium, and vitamin D. Repeat in May 2018. 3. Family history: Patient may benefit from genetic testing in the future. 4. Hypertension: Patient's blood pressure is mildly elevated today. Continue current medications as prescribed. 5. Port removal: Patient still has port and is interested in having it removed. Will discuss at next visit. Continue with q 6 week port flushes.     Patient expressed understanding and was in agreement with this plan. She also understands that She can call clinic at any time with any questions, concerns, or complaints.   Faythe Casa, NP   Breast cancer   Staging form: Breast, AJCC 7th Edition     Clinical stage from 08/01/2014: Stage IIIA (T1c, N2a, M0) - Signed by Lloyd Huger, MD on 08/01/2014  Patient was seen and evaluated independently and I agree with the assessment and plan as dictated above.  Lloyd Huger, MD 03/28/16 4:31 PM

## 2016-03-24 ENCOUNTER — Inpatient Hospital Stay: Payer: Medicare Other | Attending: Oncology

## 2016-03-24 ENCOUNTER — Inpatient Hospital Stay (HOSPITAL_BASED_OUTPATIENT_CLINIC_OR_DEPARTMENT_OTHER): Payer: Medicare Other | Admitting: Oncology

## 2016-03-24 VITALS — BP 143/85 | HR 48 | Temp 96.3°F | Resp 18 | Wt 171.1 lb

## 2016-03-24 DIAGNOSIS — I10 Essential (primary) hypertension: Secondary | ICD-10-CM

## 2016-03-24 DIAGNOSIS — Z9012 Acquired absence of left breast and nipple: Secondary | ICD-10-CM

## 2016-03-24 DIAGNOSIS — Z85038 Personal history of other malignant neoplasm of large intestine: Secondary | ICD-10-CM | POA: Insufficient documentation

## 2016-03-24 DIAGNOSIS — J449 Chronic obstructive pulmonary disease, unspecified: Secondary | ICD-10-CM | POA: Insufficient documentation

## 2016-03-24 DIAGNOSIS — Z7982 Long term (current) use of aspirin: Secondary | ICD-10-CM | POA: Diagnosis not present

## 2016-03-24 DIAGNOSIS — Z9221 Personal history of antineoplastic chemotherapy: Secondary | ICD-10-CM | POA: Diagnosis not present

## 2016-03-24 DIAGNOSIS — Z950 Presence of cardiac pacemaker: Secondary | ICD-10-CM | POA: Diagnosis not present

## 2016-03-24 DIAGNOSIS — Z79899 Other long term (current) drug therapy: Secondary | ICD-10-CM | POA: Insufficient documentation

## 2016-03-24 DIAGNOSIS — I429 Cardiomyopathy, unspecified: Secondary | ICD-10-CM | POA: Diagnosis not present

## 2016-03-24 DIAGNOSIS — Z170 Estrogen receptor positive status [ER+]: Secondary | ICD-10-CM | POA: Diagnosis not present

## 2016-03-24 DIAGNOSIS — Z90710 Acquired absence of both cervix and uterus: Secondary | ICD-10-CM | POA: Insufficient documentation

## 2016-03-24 DIAGNOSIS — I4891 Unspecified atrial fibrillation: Secondary | ICD-10-CM | POA: Diagnosis not present

## 2016-03-24 DIAGNOSIS — Z79811 Long term (current) use of aromatase inhibitors: Secondary | ICD-10-CM | POA: Insufficient documentation

## 2016-03-24 DIAGNOSIS — E785 Hyperlipidemia, unspecified: Secondary | ICD-10-CM | POA: Diagnosis not present

## 2016-03-24 DIAGNOSIS — F329 Major depressive disorder, single episode, unspecified: Secondary | ICD-10-CM | POA: Diagnosis not present

## 2016-03-24 DIAGNOSIS — C50212 Malignant neoplasm of upper-inner quadrant of left female breast: Secondary | ICD-10-CM

## 2016-03-24 DIAGNOSIS — Z87891 Personal history of nicotine dependence: Secondary | ICD-10-CM | POA: Insufficient documentation

## 2016-03-24 LAB — CBC WITH DIFFERENTIAL/PLATELET
BASOS ABS: 0.1 10*3/uL (ref 0–0.1)
BASOS PCT: 2 %
EOS ABS: 0.1 10*3/uL (ref 0–0.7)
EOS PCT: 2 %
HCT: 38.5 % (ref 35.0–47.0)
Hemoglobin: 13.2 g/dL (ref 12.0–16.0)
Lymphocytes Relative: 27 %
Lymphs Abs: 1.4 10*3/uL (ref 1.0–3.6)
MCH: 32.7 pg (ref 26.0–34.0)
MCHC: 34.3 g/dL (ref 32.0–36.0)
MCV: 95.2 fL (ref 80.0–100.0)
Monocytes Absolute: 0.6 10*3/uL (ref 0.2–0.9)
Monocytes Relative: 11 %
Neutro Abs: 3 10*3/uL (ref 1.4–6.5)
Neutrophils Relative %: 58 %
PLATELETS: 173 10*3/uL (ref 150–440)
RBC: 4.04 MIL/uL (ref 3.80–5.20)
RDW: 15 % — AB (ref 11.5–14.5)
WBC: 5.1 10*3/uL (ref 3.6–11.0)

## 2016-03-24 LAB — COMPREHENSIVE METABOLIC PANEL
ALBUMIN: 4.1 g/dL (ref 3.5–5.0)
ALT: 14 U/L (ref 14–54)
AST: 21 U/L (ref 15–41)
Alkaline Phosphatase: 32 U/L — ABNORMAL LOW (ref 38–126)
Anion gap: 4 — ABNORMAL LOW (ref 5–15)
BUN: 29 mg/dL — AB (ref 6–20)
CHLORIDE: 101 mmol/L (ref 101–111)
CO2: 29 mmol/L (ref 22–32)
Calcium: 9.3 mg/dL (ref 8.9–10.3)
Creatinine, Ser: 1.06 mg/dL — ABNORMAL HIGH (ref 0.44–1.00)
GFR calc Af Amer: 56 mL/min — ABNORMAL LOW (ref >60)
GFR, EST NON AFRICAN AMERICAN: 49 mL/min — AB (ref >60)
GLUCOSE: 93 mg/dL (ref 65–99)
POTASSIUM: 4 mmol/L (ref 3.5–5.1)
SODIUM: 134 mmol/L — AB (ref 135–145)
Total Bilirubin: 0.5 mg/dL (ref 0.3–1.2)
Total Protein: 7.6 g/dL (ref 6.5–8.1)

## 2016-03-24 NOTE — Progress Notes (Signed)
Offers no complaints. States is feeling well. 

## 2016-03-25 LAB — CANCER ANTIGEN 27.29: CA 27.29: 24.4 U/mL (ref 0.0–38.6)

## 2016-04-07 ENCOUNTER — Other Ambulatory Visit: Payer: Self-pay | Admitting: Oncology

## 2016-05-20 ENCOUNTER — Ambulatory Visit
Admission: RE | Admit: 2016-05-20 | Discharge: 2016-05-20 | Disposition: A | Discharge status: Home / Self Care | Payer: Medicare Other | Source: Ambulatory Visit | Attending: Oncology | Admitting: Oncology

## 2016-05-20 DIAGNOSIS — Z853 Personal history of malignant neoplasm of breast: Secondary | ICD-10-CM | POA: Insufficient documentation

## 2016-05-20 DIAGNOSIS — Z1231 Encounter for screening mammogram for malignant neoplasm of breast: Secondary | ICD-10-CM | POA: Diagnosis present

## 2016-05-20 DIAGNOSIS — C50212 Malignant neoplasm of upper-inner quadrant of left female breast: Secondary | ICD-10-CM

## 2016-05-20 DIAGNOSIS — M818 Other osteoporosis without current pathological fracture: Secondary | ICD-10-CM | POA: Insufficient documentation

## 2016-05-20 HISTORY — DX: Personal history of antineoplastic chemotherapy: Z92.21

## 2016-06-06 ENCOUNTER — Other Ambulatory Visit: Payer: Self-pay | Admitting: Oncology

## 2016-06-23 NOTE — Progress Notes (Signed)
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  INTERVAL HISTORY: Patient returns to clinic today for routine evaluation. She continues to tolerate letrozole and Fosamax well without side effects. Currently, she feels well and remains asymptomatic. She does not complain of pain today. She denies any neurologic complaints. She denies any fever, chills, or night sweats.  She has a good appetite and denies weight loss.  She denies any chest pain, shortness of breath, cough, or hemoptysis.  Patient offers no specific complaints today.  REVIEW OF SYSTEMS:   Review of Systems  Constitutional: Negative.  Negative for fever, malaise/fatigue and weight loss.  Respiratory: Negative.  Negative for cough and shortness of breath.   Cardiovascular: Negative.  Negative for chest pain and leg swelling.  Gastrointestinal: Negative.  Negative for abdominal pain.  Musculoskeletal: Negative.   Skin: Negative.  Negative for rash.  Neurological: Negative.  Negative for weakness.  Endo/Heme/Allergies: Negative.   Psychiatric/Behavioral: The patient is not nervous/anxious.     As per HPI. Otherwise, a complete review of systems is negative.  PAST MEDICAL HISTORY: Past Medical History:  Diagnosis Date  . Anemia   . Atrial fibrillation ([place])    history of  . Breast cancer ([place]) 2015   LT MASTECTOMY  . 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 COPD (chronic obstructive pulmonary disease) ([place])   . Depression   . DJD (degenerative joint disease) of knee    right knee  . H/O hysterectomy with oophorectomy    for DUB  . History of depression   . HLD  (hyperlipidemia)   . HTN (hypertension)   . LV (left ventricular) mural thrombus    history of, resolved (echo 04/09)  . Other primary cardiomyopathies   . Pacemaker 05/2007   [employer] ICD, [employer] lead, [facility] Dr Isaias Sakai  . Personal history of chemotherapy 2015   BREAST CA    PAST SURGICAL HISTORY: Past Surgical History:  Procedure Laterality Date  . ABDOMINAL HYSTERECTOMY    . BREAST BIOPSY Left 05/10/13   [place] bx-positive  . LAPAROSCOPIC RIGHT COLON RESECTION  08/10   Dr Pat Patrick, for adenoca   . MASTECTOMY Left 2015   BREAST CA  . PACEMAKER INSERTION  2009  . REPLACEMENT TOTAL KNEE  1990's   right     FAMILY HISTORY Family History  Problem Relation Age of Onset  . Hypertension Father   . Alzheimer's disease Mother   . Breast cancer Sister 48  . Diabetes Unknown        brothers  . Stroke Brother   . Heart failure Brother   . Lupus Sister        ADVANCED DIRECTIVES:    HEALTH MAINTENANCE: Social History  Substance Use Topics  . Smoking status: Former Research scientist (life sciences)  . Smokeless tobacco: Never Used     Comment: quit 2006  . Alcohol use No     Comment: former     Allergies  Allergen Reactions  . No Known Allergies     Current Outpatient Prescriptions  Medication Sig Dispense Refill  . alendronate (FOSAMAX) 70 MG tablet TAKE 1 TABLET WEEKLY 30 MINUTES BEFORE BREAKFAST WITH A FULL GLASS OFWATER DO NOT LIE  DOWN FOR 30 MINUTES 4 tablet 5  . aspirin 81 MG tablet Take 81 mg by mouth daily.    . fluticasone (FLONASE) 50 MCG/ACT nasal spray Place 1 spray into both nostrils daily.    . furosemide (LASIX) 40 MG tablet Take 40 mg by mouth daily.     Marland Kitchen ibuprofen (ADVIL,MOTRIN) 200 MG tablet Take 200 mg by mouth every 6 (six) hours as needed.    . isosorbide mononitrate (IMDUR) 30 MG 24 hr tablet Take 1 tablet by mouth Daily.    Marland Kitchen KLOR-CON M20 20 MEQ tablet Take 1 tablet by mouth Daily.    Marland Kitchen lisinopril (PRINIVIL,ZESTRIL) 10 MG tablet Take 2 tablets by mouth Daily.       Marland Kitchen lovastatin (MEVACOR) 40 MG tablet Take 2 tablets by mouth Daily.    . metoprolol succinate (TOPROL-XL) 50 MG 24 hr tablet Take 50 mg by mouth daily.     . MULTIPLE VITAMIN PO Take by mouth daily.    Marland Kitchen spironolactone (ALDACTONE) 25 MG tablet Take 1 tablet by mouth Daily.    . tamoxifen (NOLVADEX) 20 MG tablet Take 1 tablet (20 mg total) by mouth daily. 30 tablet 11   No current facility-administered medications for this visit.     OBJECTIVE: Vitals:   06/24/16 1016  BP: (!) 147/71  Pulse: 94  Resp: 18  Temp: (!) 96.4 F (35.8 C)     Body mass index is 30.59 kg/m.    ECOG FS:0 - Asymptomatic  General: Well-developed, well-nourished, no acute distress. Sitting in wheelchair Eyes: Pink conjunctiva, anicteric sclera. Breasts: Left chest wall without evidence of recurrence. Patient requested exam be deferred today. Lungs: Clear to auscultation bilaterally. Heart: Regular rate and rhythm. No rubs, murmurs, or gallops. Abdomen: Soft, nontender, nondistended. No organomegaly noted, normoactive bowel sounds. Musculoskeletal: No edema, cyanosis, or clubbing. Neuro: Alert, answering all questions appropriately. Cranial nerves grossly intact. Skin: No rashes or petechiae noted. Psych: Normal affect.   LAB RESULTS:  Lab Results  Component Value Date   NA 134 (L) 03/24/2016   K 4.0 03/24/2016   CL 101 03/24/2016   CO2 29 03/24/2016   GLUCOSE 93 03/24/2016   BUN 29 (H) 03/24/2016   CREATININE 1.06 (H) 03/24/2016   CALCIUM 9.3 03/24/2016   PROT 7.6 03/24/2016   ALBUMIN 4.1 03/24/2016   AST 21 03/24/2016   ALT 14 03/24/2016   ALKPHOS 32 (L) 03/24/2016   BILITOT 0.5 03/24/2016   GFRNONAA 49 (L) 03/24/2016   GFRAA 56 (L) 03/24/2016    Lab Results  Component Value Date   WBC 5.1 03/24/2016   NEUTROABS 3.0 03/24/2016   HGB 13.2 03/24/2016   HCT 38.5 03/24/2016   MCV 95.2 03/24/2016   PLT 173 03/24/2016   Lab Results  Component Value Date   LABCA2 24.4 03/24/2016      STUDIES: No results found.  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  PLAN:    1. 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 Despite this, left breast mastectomy revealed significant residual disease. Patient underwent total mastectomy on December 13, 2013.  Adjuvant XRT was recommended, but patient refused. She acknowledged that this would increase her risk of recurrence but she  still declined. Given patient's worsening osteoporosis, have recommended discontinuing letrozole and patient was placed on tamoxifen. She will complete her hormonal therapy in January 2021. CA-27-29 continues to be within normal limits.  Right breast screening mammogram on May 20, 2016 reported BIRADS-1, repeat in one year. Return to clinic in 6 months for further evaluation.  2. Osteoporosis: Bone mineral density on May 20, 2016 revealed a T score of -3.1. Which is slightly worse than previous. Continue Fosamax, calcium, and vitamin D. Discontinue letrozole and initiated tamoxifen as above. Repeat in May 2019. 3. Family history: Patient may benefit from genetic testing in the future. 4. Hypertension: Patient's blood pressure is mildly elevated today. Continue current medications as prescribed.   Patient expressed understanding and was in agreement with this plan. She also understands that She can call clinic at any time with any questions, concerns, or complaints.   Breast cancer   Staging form: Breast, AJCC 7th Edition     Clinical stage from 08/01/2014: Stage IIIA (T1c, N2a, M0) - Signed by Lloyd Huger, MD on 08/01/2014   Lloyd Huger, MD   06/26/2016 7:50 AM

## 2016-06-24 ENCOUNTER — Inpatient Hospital Stay: Payer: Medicare Other | Attending: Oncology | Admitting: Oncology

## 2016-06-24 VITALS — BP 147/71 | HR 94 | Temp 96.4°F | Resp 18 | Wt 167.2 lb

## 2016-06-24 DIAGNOSIS — Z9221 Personal history of antineoplastic chemotherapy: Secondary | ICD-10-CM | POA: Insufficient documentation

## 2016-06-24 DIAGNOSIS — M818 Other osteoporosis without current pathological fracture: Secondary | ICD-10-CM | POA: Diagnosis not present

## 2016-06-24 DIAGNOSIS — Z90710 Acquired absence of both cervix and uterus: Secondary | ICD-10-CM | POA: Diagnosis not present

## 2016-06-24 DIAGNOSIS — I4891 Unspecified atrial fibrillation: Secondary | ICD-10-CM | POA: Insufficient documentation

## 2016-06-24 DIAGNOSIS — E785 Hyperlipidemia, unspecified: Secondary | ICD-10-CM | POA: Diagnosis not present

## 2016-06-24 DIAGNOSIS — C50212 Malignant neoplasm of upper-inner quadrant of left female breast: Secondary | ICD-10-CM | POA: Diagnosis not present

## 2016-06-24 DIAGNOSIS — I10 Essential (primary) hypertension: Secondary | ICD-10-CM | POA: Insufficient documentation

## 2016-06-24 DIAGNOSIS — Z79810 Long term (current) use of selective estrogen receptor modulators (SERMs): Secondary | ICD-10-CM | POA: Diagnosis not present

## 2016-06-24 DIAGNOSIS — Z170 Estrogen receptor positive status [ER+]: Secondary | ICD-10-CM | POA: Insufficient documentation

## 2016-06-24 DIAGNOSIS — I429 Cardiomyopathy, unspecified: Secondary | ICD-10-CM | POA: Diagnosis not present

## 2016-06-24 DIAGNOSIS — Z950 Presence of cardiac pacemaker: Secondary | ICD-10-CM | POA: Diagnosis not present

## 2016-06-24 DIAGNOSIS — Z9012 Acquired absence of left breast and nipple: Secondary | ICD-10-CM | POA: Insufficient documentation

## 2016-06-24 MED ORDER — TAMOXIFEN CITRATE 20 MG PO TABS: 20.0000 mg | ORAL_TABLET | Freq: Every day | ORAL | 11 refills | Status: DC

## 2016-06-24 NOTE — Progress Notes (Signed)
Patient here today for follow up.  Patient states no new concerns today  

## 2016-12-01 ENCOUNTER — Other Ambulatory Visit: Payer: Self-pay | Admitting: Oncology

## 2016-12-24 ENCOUNTER — Ambulatory Visit: Payer: Medicare Other | Admitting: Oncology

## 2016-12-29 ENCOUNTER — Other Ambulatory Visit: Payer: Self-pay

## 2016-12-29 DIAGNOSIS — Z1211 Encounter for screening for malignant neoplasm of colon: Secondary | ICD-10-CM

## 2016-12-29 NOTE — Progress Notes (Signed)
Gastroenterology Pre-Procedure Review  Request Date: 01/15/17 Requesting Physician: Dr. Vicente Males  PATIENT REVIEW QUESTIONS: The patient responded to the following health history questions as indicated:    1. Are you having any GI issues? no 2. Do you have a personal history of Polyps? yes (unsure of date) 3. Do you have a family history of Colon Cancer or Polyps? no 4. Diabetes Mellitus? no 5. Joint replacements in the past 12 months?no 6. Major health problems in the past 3 months?no 7. Any artificial heart valves, MVP, or defibrillator?yes (Pacemnaker)    MEDICATIONS & ALLERGIES:    Patient reports the following regarding taking any anticoagulation/antiplatelet therapy:   Plavix, Coumadin, Eliquis, Xarelto, Lovenox, Pradaxa, Brilinta, or Effient? no Aspirin? yes (81 mg baby aspirin)  Patient confirms/reports the following medications:  Current Outpatient Medications  Medication Sig Dispense Refill  . alendronate (FOSAMAX) 70 MG tablet TAKE 1 TABLET WEEKLY 30 MINUTES BEFORE BREAKFAST WITH A FULL GLASS OFWATER DO NOT LIE DOWN FOR 30 MINUTES 4 tablet 5  . aspirin 81 MG tablet Take 81 mg by mouth daily.    . fluticasone (FLONASE) 50 MCG/ACT nasal spray Place 1 spray into both nostrils daily.    . furosemide (LASIX) 40 MG tablet Take 40 mg by mouth daily.     Marland Kitchen ibuprofen (ADVIL,MOTRIN) 200 MG tablet Take 200 mg by mouth every 6 (six) hours as needed.    . isosorbide mononitrate (IMDUR) 30 MG 24 hr tablet Take 1 tablet by mouth Daily.    Marland Kitchen KLOR-CON M20 20 MEQ tablet Take 1 tablet by mouth Daily.    Marland Kitchen lisinopril (PRINIVIL,ZESTRIL) 10 MG tablet Take 2 tablets by mouth Daily.     Marland Kitchen lovastatin (MEVACOR) 40 MG tablet Take 2 tablets by mouth Daily.    . metoprolol succinate (TOPROL-XL) 50 MG 24 hr tablet Take 50 mg by mouth daily.     . MULTIPLE VITAMIN PO Take by mouth daily.    Marland Kitchen spironolactone (ALDACTONE) 25 MG tablet Take 1 tablet by mouth Daily.    . tamoxifen (NOLVADEX) 20 MG tablet Take 1  tablet (20 mg total) by mouth daily. 30 tablet 11   No current facility-administered medications for this visit.     Patient confirms/reports the following allergies:  Allergies  Allergen Reactions  . No Known Allergies     No orders of the defined types were placed in this encounter.   AUTHORIZATION INFORMATION Primary Insurance: 1D#: Group #:  Secondary Insurance: 1D#: Group #:  SCHEDULE INFORMATION: Date: 01/15/17 Time: Location:[facility]

## 2017-01-11 NOTE — Progress Notes (Deleted)
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  INTERVAL HISTORY: Patient returns to clinic today for routine evaluation. She continues to tolerate letrozole and Fosamax well without side effects. Currently, she feels well and remains asymptomatic. She does not complain of pain today. She denies any neurologic complaints. She denies any fever, chills, or night sweats.  She has a good appetite and denies weight loss.  She denies any chest pain, shortness of breath, cough, or hemoptysis.  Patient offers no specific complaints today.  REVIEW OF SYSTEMS:   Review of Systems  Constitutional: Negative.  Negative for fever, malaise/fatigue and weight loss.  Respiratory: Negative.  Negative for cough and shortness of breath.   Cardiovascular: Negative.  Negative for chest pain and leg swelling.  Gastrointestinal: Negative.  Negative for abdominal pain.  Musculoskeletal: Negative.   Skin: Negative.  Negative for rash.  Neurological: Negative.  Negative for weakness.  Endo/Heme/Allergies: Negative.   Psychiatric/Behavioral: The patient is not nervous/anxious.     As per HPI. Otherwise, a complete review of systems is negative.  PAST MEDICAL HISTORY: Past Medical History:  Diagnosis Date  . Anemia   . Atrial fibrillation ([place])    history of  . Breast cancer ([place]) 2015   LT MASTECTOMY  . Colon adenocarcinoma ([place])   . COPD (chronic obstructive pulmonary disease) ([place])   . Depression   . DJD (degenerative joint disease) of knee    right knee  . H/O hysterectomy with oophorectomy    for DUB  . History of depression   . HLD  (hyperlipidemia)   . HTN (hypertension)   . LV (left ventricular) mural thrombus    history of, resolved (echo 04/09)  . Other primary cardiomyopathies   . Pacemaker 05/2007   [employer] ICD, Guidant lead, [facility] Dr Isaias Sakai  . Personal history of chemotherapy 2015   BREAST CA    PAST SURGICAL HISTORY: Past Surgical History:  Procedure Laterality Date  . ABDOMINAL HYSTERECTOMY    . BREAST BIOPSY Left 05/10/13   [place] bx-positive  . LAPAROSCOPIC RIGHT COLON RESECTION  08/10   Dr Pat Patrick, for adenoca   . MASTECTOMY Left 2015   BREAST CA  . PACEMAKER INSERTION  2009  . REPLACEMENT TOTAL KNEE  1990's   right     FAMILY HISTORY Family History  Problem Relation Age of Onset  . Hypertension Father   . Alzheimer's disease Mother   . Breast cancer Sister 76  . Diabetes Unknown        brothers  . Stroke Brother   . Heart failure Brother   . Lupus Sister        ADVANCED DIRECTIVES:    HEALTH MAINTENANCE: Social History   Tobacco Use  . Smoking status: Former Research scientist (life sciences)  . Smokeless tobacco: Never Used  . Tobacco comment: quit 2006  Substance Use Topics  . Alcohol use: No    Comment: former  . Drug use: No     Allergies  Allergen Reactions  . No Known Allergies     Current Outpatient Medications  Medication Sig Dispense Refill  . alendronate (FOSAMAX) 70 MG tablet TAKE 1 TABLET WEEKLY 30 MINUTES BEFORE BREAKFAST WITH  A FULL GLASS OFWATER DO NOT LIE DOWN FOR 30 MINUTES 4 tablet 5  . aspirin 81 MG tablet Take 81 mg by mouth daily.    . fluticasone (FLONASE) 50 MCG/ACT nasal spray Place 1 spray into both nostrils daily.    . furosemide (LASIX) 40 MG tablet Take 40 mg by mouth daily.     Marland Kitchen ibuprofen (ADVIL,MOTRIN) 200 MG tablet Take 200 mg by mouth every 6 (six) hours as needed.    . isosorbide mononitrate (IMDUR) 30 MG 24 hr tablet Take 1 tablet by mouth Daily.    Marland Kitchen KLOR-CON M20 20 MEQ tablet Take 1 tablet by mouth Daily.    Marland Kitchen lisinopril (PRINIVIL,ZESTRIL) 10 MG  tablet Take 2 tablets by mouth Daily.     Marland Kitchen lovastatin (MEVACOR) 40 MG tablet Take 2 tablets by mouth Daily.    . metoprolol succinate (TOPROL-XL) 50 MG 24 hr tablet Take 50 mg by mouth daily.     . MULTIPLE VITAMIN PO Take by mouth daily.    Marland Kitchen spironolactone (ALDACTONE) 25 MG tablet Take 1 tablet by mouth Daily.    . tamoxifen (NOLVADEX) 20 MG tablet Take 1 tablet (20 mg total) by mouth daily. 30 tablet 11   No current facility-administered medications for this visit.     OBJECTIVE: There were no vitals filed for this visit.   There is no height or weight on file to calculate BMI.    ECOG FS:0 - Asymptomatic  General: Well-developed, well-nourished, no acute distress. Sitting in wheelchair Eyes: Pink conjunctiva, anicteric sclera. Breasts: Left chest wall without evidence of recurrence. Patient requested exam be deferred today. Lungs: Clear to auscultation bilaterally. Heart: Regular rate and rhythm. No rubs, murmurs, or gallops. Abdomen: Soft, nontender, nondistended. No organomegaly noted, normoactive bowel sounds. Musculoskeletal: No edema, cyanosis, or clubbing. Neuro: Alert, answering all questions appropriately. Cranial nerves grossly intact. Skin: No rashes or petechiae noted. Psych: Normal affect.   LAB RESULTS:  Lab Results  Component Value Date   NA 134 (L) 03/24/2016   K 4.0 03/24/2016   CL 101 03/24/2016   CO2 29 03/24/2016   GLUCOSE 93 03/24/2016   BUN 29 (H) 03/24/2016   CREATININE 1.06 (H) 03/24/2016   CALCIUM 9.3 03/24/2016   PROT 7.6 03/24/2016   ALBUMIN 4.1 03/24/2016   AST 21 03/24/2016   ALT 14 03/24/2016   ALKPHOS 32 (L) 03/24/2016   BILITOT 0.5 03/24/2016   GFRNONAA 49 (L) 03/24/2016   GFRAA 56 (L) 03/24/2016    Lab Results  Component Value Date   WBC 5.1 03/24/2016   NEUTROABS 3.0 03/24/2016   HGB 13.2 03/24/2016   HCT 38.5 03/24/2016   MCV 95.2 03/24/2016   PLT 173 03/24/2016   Lab Results  Component Value Date   LABCA2 24.4  03/24/2016     STUDIES: No results found.  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  PLAN:    1. 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 Despite this, left breast mastectomy revealed significant residual disease. Patient underwent total mastectomy on December 13, 2013.  Adjuvant XRT was recommended, but patient refused. She acknowledged that this would increase her risk of recurrence but she still declined. Given patient's worsening  osteoporosis, have recommended discontinuing letrozole and patient was placed on tamoxifen. She will complete her hormonal therapy in January 2021. CA-27-29 continues to be within normal limits.  Right breast screening mammogram on May 20, 2016 reported BIRADS-1, repeat in one year. Return to clinic in 6 months for further evaluation.  2. Osteoporosis: Bone mineral density on May 20, 2016 revealed a T score of -3.1. Which is slightly worse than previous. Continue Fosamax, calcium, and vitamin D. Discontinue letrozole and initiated tamoxifen as above. Repeat in May 2019. 3. Family history: Patient may benefit from genetic testing in the future. 4. Hypertension: Patient's blood pressure is mildly elevated today. Continue current medications as prescribed.   Patient expressed understanding and was in agreement with this plan. She also understands that She can call clinic at any time with any questions, concerns, or complaints.   Breast cancer   Staging form: Breast, AJCC 7th Edition     Clinical stage from 08/01/2014: Stage IIIA (T1c, N2a, M0) - Signed by Lloyd Huger, MD on  08/01/2014   Lloyd Huger, MD   01/11/2017 9:33 PM

## 2017-01-14 ENCOUNTER — Inpatient Hospital Stay: Payer: Medicare HMO | Admitting: Oncology

## 2017-01-14 ENCOUNTER — Encounter: Payer: Self-pay | Admitting: Oncology

## 2017-01-15 ENCOUNTER — Ambulatory Visit: Payer: Medicare HMO | Admitting: Anesthesiology

## 2017-01-15 ENCOUNTER — Encounter
Admission: RE | Disposition: A | Discharge status: Home / Self Care | Payer: Self-pay | Source: Ambulatory Visit | Attending: Gastroenterology

## 2017-01-15 ENCOUNTER — Encounter: Payer: Self-pay | Admitting: Emergency Medicine

## 2017-01-15 ENCOUNTER — Ambulatory Visit
Admission: RE | Admit: 2017-01-15 | Discharge: 2017-01-15 | Disposition: A | Discharge status: Home / Self Care | Payer: Medicare HMO | Source: Ambulatory Visit | Attending: Gastroenterology | Admitting: Gastroenterology

## 2017-01-15 DIAGNOSIS — Z85038 Personal history of other malignant neoplasm of large intestine: Secondary | ICD-10-CM | POA: Diagnosis not present

## 2017-01-15 DIAGNOSIS — Z853 Personal history of malignant neoplasm of breast: Secondary | ICD-10-CM | POA: Diagnosis not present

## 2017-01-15 DIAGNOSIS — Z980 Intestinal bypass and anastomosis status: Secondary | ICD-10-CM | POA: Insufficient documentation

## 2017-01-15 DIAGNOSIS — F329 Major depressive disorder, single episode, unspecified: Secondary | ICD-10-CM | POA: Insufficient documentation

## 2017-01-15 DIAGNOSIS — K621 Rectal polyp: Secondary | ICD-10-CM | POA: Diagnosis not present

## 2017-01-15 DIAGNOSIS — Z7982 Long term (current) use of aspirin: Secondary | ICD-10-CM | POA: Insufficient documentation

## 2017-01-15 DIAGNOSIS — J449 Chronic obstructive pulmonary disease, unspecified: Secondary | ICD-10-CM | POA: Diagnosis not present

## 2017-01-15 DIAGNOSIS — I429 Cardiomyopathy, unspecified: Secondary | ICD-10-CM | POA: Diagnosis not present

## 2017-01-15 DIAGNOSIS — Z1211 Encounter for screening for malignant neoplasm of colon: Secondary | ICD-10-CM

## 2017-01-15 DIAGNOSIS — E785 Hyperlipidemia, unspecified: Secondary | ICD-10-CM | POA: Diagnosis not present

## 2017-01-15 DIAGNOSIS — Z87891 Personal history of nicotine dependence: Secondary | ICD-10-CM | POA: Diagnosis not present

## 2017-01-15 DIAGNOSIS — I4891 Unspecified atrial fibrillation: Secondary | ICD-10-CM | POA: Insufficient documentation

## 2017-01-15 DIAGNOSIS — I10 Essential (primary) hypertension: Secondary | ICD-10-CM | POA: Diagnosis not present

## 2017-01-15 DIAGNOSIS — Z79899 Other long term (current) drug therapy: Secondary | ICD-10-CM | POA: Insufficient documentation

## 2017-01-15 DIAGNOSIS — Z8719 Personal history of other diseases of the digestive system: Secondary | ICD-10-CM | POA: Insufficient documentation

## 2017-01-15 DIAGNOSIS — Z09 Encounter for follow-up examination after completed treatment for conditions other than malignant neoplasm: Secondary | ICD-10-CM | POA: Diagnosis present

## 2017-01-15 HISTORY — PX: COLONOSCOPY WITH PROPOFOL: SHX5780

## 2017-01-15 SURGERY — COLONOSCOPY WITH PROPOFOL
Anesthesia: General

## 2017-01-15 MED ORDER — MIDAZOLAM HCL 2 MG/2ML IJ SOLN
INTRAMUSCULAR | Status: AC
  Filled 2017-01-15: qty 2

## 2017-01-15 MED ORDER — FENTANYL CITRATE (PF) 100 MCG/2ML IJ SOLN
INTRAMUSCULAR | Status: DC | PRN
  Administered 2017-01-15: 50 ug via INTRAVENOUS

## 2017-01-15 MED ORDER — PROPOFOL 500 MG/50ML IV EMUL
INTRAVENOUS | Status: AC
  Filled 2017-01-15: qty 50

## 2017-01-15 MED ORDER — PROPOFOL 500 MG/50ML IV EMUL
INTRAVENOUS | Status: DC | PRN
  Administered 2017-01-15: 100 ug/kg/min via INTRAVENOUS

## 2017-01-15 MED ORDER — MIDAZOLAM HCL 2 MG/2ML IJ SOLN
INTRAMUSCULAR | Status: DC | PRN
  Administered 2017-01-15: 1 mg via INTRAVENOUS

## 2017-01-15 MED ORDER — SODIUM CHLORIDE 0.9 % IV SOLN
INTRAVENOUS | Status: DC
  Administered 2017-01-15: 1000 mL via INTRAVENOUS

## 2017-01-15 MED ORDER — FENTANYL CITRATE (PF) 100 MCG/2ML IJ SOLN
INTRAMUSCULAR | Status: AC
  Filled 2017-01-15: qty 2

## 2017-01-15 NOTE — Op Note (Signed)
[facility] Gastroenterology Patient Name: Julia Gordon Procedure Date: 01/15/2017 9:50 AM MRN: [id number] Account #: [id number] Date of Birth: August 04, 1934 Admit Type: Outpatient Age: 83 Room: [facility] ENDO ROOM 3 Gender: Female Note Status: Finalized Procedure:            Colonoscopy Indications:          High risk colon cancer surveillance: Personal history                        of colon cancer Providers:            Jonathon Bellows MD, MD Referring MD:         Remus Blake MD, MD (Referring MD) Medicines:            Monitored Anesthesia Care Complications:        No immediate complications. Procedure:            Pre-Anesthesia Assessment:                       - Prior to the procedure, a History and Physical was                        performed, and patient medications, allergies and                        sensitivities were reviewed. The patient's tolerance of                        previous anesthesia was reviewed.                       - The risks and benefits of the procedure and the                        sedation options and risks were discussed with the                        patient. All questions were answered and informed                        consent was obtained.                       - ASA Grade Assessment: III - A patient with severe                        systemic disease.                       After obtaining informed consent, the colonoscope was                        passed under direct vision. Throughout the procedure,                        the patient's blood pressure, pulse, and oxygen                        saturations were monitored continuously. The  Colonoscope was introduced through the anus and                        advanced to the the ileocolonic anastomosis. The                        colonoscopy was performed with ease. The patient                        tolerated the procedure well. The quality of the bowel                     preparation was adequate. Findings:      The perianal and digital rectal examinations were normal.      A 10 mm polyp was found in the rectum. The polyp was sessile. The polyp       was removed with a hot snare. Resection and retrieval were complete.      There was evidence of a prior end-to-end ileo-colonic anastomosis in the       ascending colon and in the cecum. This was patent and was characterized       by healthy appearing mucosa. The anastomosis was traversed. Impression:           - One 10 mm polyp in the rectum, removed with a hot                        snare. Resected and retrieved.                       - Patent end-to-end ileo-colonic anastomosis,                        characterized by healthy appearing mucosa. Recommendation:       - Discharge patient to home (with escort).                       - Resume previous diet.                       - Continue present medications.                       - Await pathology results.                       - At her age any further colonoscopy has to be                        discussed with her PCP in terms of risks vs benefits. Procedure Code(s):    --- Professional ---                       [phone], Colonoscopy, flexible; with removal of tumor(s),                        polyp(s), or other lesion(s) by snare technique Diagnosis Code(s):    --- Professional ---                       K62.1, Rectal polyp  Z85.038, Personal history of other malignant neoplasm                        of large intestine                       Z98.0, Intestinal bypass and anastomosis status CPT copyright 2016 American Medical Association. All rights reserved. The codes documented in this report are preliminary and upon coder review may  be revised to meet current compliance requirements. Jonathon Bellows, MD Jonathon Bellows MD, MD 01/15/2017 10:20:26 AM This report has been signed electronically. Number of Addenda: 0 Note Initiated  On: 01/15/2017 9:50 AM Scope Withdrawal Time: 0 hours 9 minutes 48 seconds  Total Procedure Duration: 0 hours 20 minutes 14 seconds       [facility]

## 2017-01-15 NOTE — H&P (Signed)
Jonathon Bellows, MD [address], [place], [place], [place], [address] [address], [place], [place], [place], [address] Phone: [phone]  Fax: [phone]  Primary Care Physician:  Ellamae Sia, MD   Pre-Procedure History & Physical: HPI:  Julia Gordon is a 83 y.o. female is here for an colonoscopy.   Past Medical History:  Diagnosis Date  . Anemia   . Atrial fibrillation ([place])    history of  . Breast cancer ([place]) 2015   LT MASTECTOMY  . Colon adenocarcinoma ([place])   . COPD (chronic obstructive pulmonary disease) ([place])   . Depression   . DJD (degenerative joint disease) of knee    right knee  . H/O hysterectomy with oophorectomy    for DUB  . History of depression   . HLD (hyperlipidemia)   . HTN (hypertension)   . LV (left ventricular) mural thrombus    history of, resolved (echo 04/09)  . Other primary cardiomyopathies   . Pacemaker 05/2007   [employer] ICD, Guidant lead, [facility] Dr Isaias Sakai  . Personal history of chemotherapy 2015   BREAST CA    Past Surgical History:  Procedure Laterality Date  . ABDOMINAL HYSTERECTOMY    . BREAST BIOPSY Left 05/10/13   [place] bx-positive  . LAPAROSCOPIC RIGHT COLON RESECTION  08/10   Dr Pat Patrick, for adenoca   . MASTECTOMY Left 2015   BREAST CA  . PACEMAKER INSERTION  2009  . REPLACEMENT TOTAL KNEE  1990's   right     Prior to Admission medications   Medication Sig Start Date End Date Taking? Authorizing Provider  alendronate (FOSAMAX) 70 MG tablet TAKE 1 TABLET WEEKLY 30 MINUTES BEFORE BREAKFAST WITH A FULL GLASS OFWATER DO NOT LIE DOWN FOR 30 MINUTES 12/01/16  Yes Lloyd Huger, MD  aspirin 81 MG tablet Take 81 mg by mouth daily.   Yes [provider]  fluticasone (FLONASE) 50 MCG/ACT nasal spray Place 1 spray into both nostrils daily.   Yes [provider]  furosemide (LASIX) 40 MG tablet Take 40 mg by mouth daily.  10/09/14  Yes [provider]  ibuprofen (ADVIL,MOTRIN) 200 MG  tablet Take 200 mg by mouth every 6 (six) hours as needed.   Yes [provider]  isosorbide mononitrate (IMDUR) 30 MG 24 hr tablet Take 1 tablet by mouth Daily. 01/31/11  Yes [provider]  KLOR-CON M20 20 MEQ tablet Take 1 tablet by mouth Daily. 03/03/11  Yes [provider]  lisinopril (PRINIVIL,ZESTRIL) 10 MG tablet Take 2 tablets by mouth Daily.  03/03/11  Yes [provider]  lovastatin (MEVACOR) 40 MG tablet Take 2 tablets by mouth Daily. 01/31/11  Yes [provider]  metoprolol succinate (TOPROL-XL) 50 MG 24 hr tablet Take 50 mg by mouth daily.  07/11/14  Yes [provider]  MULTIPLE VITAMIN PO Take by mouth daily.   Yes [provider]  spironolactone (ALDACTONE) 25 MG tablet Take 1 tablet by mouth Daily. 03/03/11  Yes [provider]  tamoxifen (NOLVADEX) 20 MG tablet Take 1 tablet (20 mg total) by mouth daily. 06/24/16  Yes Lloyd Huger, MD    Allergies as of 01/01/2017 - Review Complete 06/24/2016  Allergen Reaction Noted  . No known allergies  07/18/2014    Family History  Problem Relation Age of Onset  . Hypertension Father   . Alzheimer's disease Mother   . Breast cancer Sister 32  . Diabetes Unknown  brothers  . Stroke Brother   . Heart failure Brother   . Lupus Sister     Social History   Socioeconomic History  . Marital status: Married    Spouse name: Not on file  . Number of children: Not on file  . Years of education: Not on file  . Highest education level: Not on file  Social Needs  . Financial resource strain: Not on file  . Food insecurity - worry: Not on file  . Food insecurity - inability: Not on file  . Transportation needs - medical: Not on file  . Transportation needs - non-medical: Not on file  Occupational History  . 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 Smoking status: Former Research scientist (life sciences)  .  Smokeless tobacco: Never Used  . Tobacco comment: quit 2006  Substance and Sexual Activity  . Alcohol use: No    Comment: former  . Drug use: No  . Sexual activity: Not on file  Other Topics Concern  . Not on file  Social History Narrative  . Not on file    Review of Systems: See HPI, otherwise negative ROS  Physical Exam: BP 126/62   Pulse 99   Temp (!) 96.4 F (35.8 C) (Tympanic)   Resp 18   Ht 5\' 2"  (1.575 m)   Wt 160 lb (72.6 kg)   SpO2 100%   BMI 29.26 kg/m  General:   Alert,  pleasant and cooperative in NAD Head:  Normocephalic and atraumatic. Neck:  Supple; no masses or thyromegaly. Lungs:  Clear throughout to auscultation, normal respiratory effort.    Heart:  +S1, +S2, Regular rate and rhythm, No edema. Abdomen:  Soft, nontender and nondistended. Normal bowel sounds, without guarding, and without rebound.   Neurologic:  Alert and  oriented x4;  grossly normal neurologically.  Impression/Plan: Julia Gordon is here for an colonoscopy to be performed for surveillance due to prior history of colon cancer   Risks, benefits, limitations, and alternatives regarding  colonoscopy have been reviewed with the patient.  Questions have been answered.  All parties agreeable.   Jonathon Bellows, MD  01/15/2017, 9:48 AM

## 2017-01-15 NOTE — Anesthesia Post-op Follow-up Note (Signed)
Anesthesia QCDR form completed.        

## 2017-01-15 NOTE — Transfer of Care (Signed)
Immediate Anesthesia Transfer of Care Note  Patient: Julia Gordon  Procedure(s) Performed: COLONOSCOPY WITH PROPOFOL (N/A )  Patient Location: PACU  Anesthesia Type:General  Level of Consciousness: awake and sedated  Airway & Oxygen Therapy: Patient Spontanous Breathing and Patient connected to nasal cannula oxygen  Post-op Assessment: Report given to RN and Post -op Vital signs reviewed and stable  Post vital signs: Reviewed  Last Vitals:  Vitals:   01/15/17 0926  BP: 126/62  Pulse: 99  Resp: 18  Temp: (!) 35.8 C  SpO2: 100%    Last Pain:  Vitals:   01/15/17 0926  TempSrc: Tympanic         Complications: No apparent anesthesia complications

## 2017-01-15 NOTE — Anesthesia Procedure Notes (Signed)
Performed by: Cook-Martin, Pauline Trainer Pre-anesthesia Checklist: Patient identified, Emergency Drugs available, Suction available, Patient being monitored and Timeout performed Patient Re-evaluated:Patient Re-evaluated prior to induction Oxygen Delivery Method: Nasal cannula Preoxygenation: Pre-oxygenation with 100% oxygen Induction Type: IV induction Placement Confirmation: CO2 detector and positive ETCO2       

## 2017-01-16 LAB — SURGICAL PATHOLOGY

## 2017-01-18 NOTE — Anesthesia Preprocedure Evaluation (Signed)
Anesthesia Evaluation  Patient identified by MRN, date of birth, ID band Patient awake    Reviewed: Allergy & Precautions, H&P , NPO status , Patient's Chart, lab work & pertinent test results, reviewed documented beta blocker date and time   Airway Mallampati: II   Neck ROM: full    Dental  (+) Poor Dentition   Pulmonary neg pulmonary ROS, former smoker,    Pulmonary exam normal        Cardiovascular hypertension, negative cardio ROS Normal cardiovascular exam Rhythm:regular Rate:Normal     Neuro/Psych negative neurological ROS  negative psych ROS   GI/Hepatic negative GI ROS, Neg liver ROS,   Endo/Other  negative endocrine ROS  Renal/GU negative Renal ROS  negative genitourinary   Musculoskeletal   Abdominal   Peds  Hematology negative hematology ROS (+)   Anesthesia Other Findings Past Medical History: No date: Anemia No date: Atrial fibrillation ([facility])     Comment:  history of 2015: Breast cancer ([place])     Comment:  LT MASTECTOMY No date: Colon adenocarcinoma ([facility]) No date: COPD (chronic obstructive pulmonary disease) ([facility]) No date: Depression No date: DJD (degenerative joint disease) of knee     Comment:  right knee No date: H/O hysterectomy with oophorectomy     Comment:  for DUB No date: History of depression No date: HLD (hyperlipidemia) No date: HTN (hypertension) No date: LV (left ventricular) mural thrombus     Comment:  history of, resolved (echo 04/09) No date: Other primary cardiomyopathies 05/2007: Pacemaker     Comment:  [place], Guidant lead, [facility] Dr Isaias Sakai 2015: Personal history of chemotherapy     Comment:  BREAST CA Past Surgical History: No date: ABDOMINAL HYSTERECTOMY 05/10/13: BREAST BIOPSY; Left     Comment:  [place] bx-positive 08/10: LAPAROSCOPIC RIGHT COLON RESECTION     Comment:  Dr Pat Patrick, for adenoca  2015: MASTECTOMY; Left     Comment:  BREAST CA 2009:  PACEMAKER INSERTION 1990's: REPLACEMENT TOTAL KNEE     Comment:  right  BMI    Body Mass Index:  29.26 kg/m     Reproductive/Obstetrics negative OB ROS                             Anesthesia Physical Anesthesia Plan  ASA: III  Anesthesia Plan: General   Post-op Pain Management:    Induction:   PONV Risk Score and Plan:   Airway Management Planned:   Additional Equipment:   Intra-op Plan:   Post-operative Plan:   Informed Consent: I have reviewed the patients History and Physical, chart, labs and discussed the procedure including the risks, benefits and alternatives for the proposed anesthesia with the patient or authorized representative who has indicated his/her understanding and acceptance.   Dental Advisory Given  Plan Discussed with: CRNA  Anesthesia Plan Comments:         Anesthesia Quick Evaluation

## 2017-01-18 NOTE — Anesthesia Postprocedure Evaluation (Signed)
Anesthesia Post Note  Patient: Julia Gordon  Procedure(s) Performed: COLONOSCOPY WITH PROPOFOL (N/A )  Patient location during evaluation: PACU Anesthesia Type: General Level of consciousness: awake and alert Pain management: pain level controlled Vital Signs Assessment: post-procedure vital signs reviewed and stable Respiratory status: spontaneous breathing, nonlabored ventilation, respiratory function stable and patient connected to nasal cannula oxygen Cardiovascular status: blood pressure returned to baseline and stable Postop Assessment: no apparent nausea or vomiting Anesthetic complications: no     Last Vitals:  Vitals:   01/15/17 1044 01/15/17 1054  BP: 129/69 (!) 142/57  Pulse: (!) 33   Resp: 18 18  Temp:    SpO2: (!) 89% (!) 72%    Last Pain:  Vitals:   01/16/17 0747  TempSrc:   PainSc: 0-No pain                 Molli Barrows

## 2017-01-19 ENCOUNTER — Encounter: Payer: Self-pay | Admitting: Gastroenterology

## 2017-01-22 ENCOUNTER — Inpatient Hospital Stay: Payer: Medicare HMO | Attending: Oncology | Admitting: Oncology

## 2017-01-22 VITALS — BP 122/74 | HR 63 | Temp 97.5°F | Resp 18 | Wt 161.4 lb

## 2017-01-22 DIAGNOSIS — M810 Age-related osteoporosis without current pathological fracture: Secondary | ICD-10-CM | POA: Insufficient documentation

## 2017-01-22 DIAGNOSIS — C50212 Malignant neoplasm of upper-inner quadrant of left female breast: Secondary | ICD-10-CM | POA: Insufficient documentation

## 2017-01-22 DIAGNOSIS — Z79810 Long term (current) use of selective estrogen receptor modulators (SERMs): Secondary | ICD-10-CM | POA: Insufficient documentation

## 2017-01-22 DIAGNOSIS — Z170 Estrogen receptor positive status [ER+]: Secondary | ICD-10-CM | POA: Insufficient documentation

## 2017-01-22 NOTE — Progress Notes (Signed)
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  INTERVAL HISTORY: Patient returns to clinic today for routine evaluation.  She was recently switched to tamoxifen secondary to worsening osteoporosis and is tolerating this well. Currently, she feels well and remains asymptomatic. She does not complain of pain today. She denies any neurologic complaints. She denies any fever, chills, or night sweats.  She has a good appetite and denies weight loss.  She denies any chest pain, shortness of breath, cough, or hemoptysis.  Patient offers no specific complaints today.  REVIEW OF SYSTEMS:   Review of Systems  Constitutional: Negative.  Negative for fever, malaise/fatigue and weight loss.  Respiratory: Negative.  Negative for cough and shortness of breath.   Cardiovascular: Negative.  Negative for chest pain and leg swelling.  Gastrointestinal: Negative.  Negative for abdominal pain.  Musculoskeletal: Negative.   Skin: Negative.  Negative for rash.  Neurological: Negative.  Negative for weakness.  Endo/Heme/Allergies: Negative.   Psychiatric/Behavioral: The patient is not nervous/anxious.     As per HPI. Otherwise, a complete review of systems is negative.  PAST MEDICAL HISTORY: Past Medical History:  Diagnosis Date  . Anemia   . Atrial fibrillation ([place])    history of  . Breast cancer ([place]) 2015   LT MASTECTOMY  . 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 COPD (chronic obstructive pulmonary disease) ([place])   . Depression   . DJD (degenerative joint disease) of knee    right knee  . H/O hysterectomy with oophorectomy    for DUB  .  History of depression   . HLD (hyperlipidemia)   . HTN (hypertension)   . LV (left ventricular) mural thrombus    history of, resolved (echo 04/09)  . Other primary cardiomyopathies   . Pacemaker 05/2007   [employer] ICD, Guidant lead, [facility] Dr Isaias Sakai  . Personal history of chemotherapy 2015   BREAST CA    PAST SURGICAL HISTORY: Past Surgical History:  Procedure Laterality Date  . ABDOMINAL HYSTERECTOMY    . BREAST BIOPSY Left 05/10/13   [place] bx-positive  . COLONOSCOPY WITH PROPOFOL N/A 01/15/2017   Procedure: COLONOSCOPY WITH PROPOFOL;  Surgeon: Jonathon Bellows, MD;  Location: [facility] ENDOSCOPY;  Service: Gastroenterology;  Laterality: N/A;  . LAPAROSCOPIC RIGHT COLON RESECTION  08/10   Dr Pat Patrick, for adenoca   . MASTECTOMY Left 2015   BREAST CA  . PACEMAKER INSERTION  2009  . REPLACEMENT TOTAL KNEE  1990's   right     FAMILY HISTORY Family History  Problem Relation Age of Onset  . Hypertension Father   . Alzheimer's disease Mother   . Breast cancer Sister 53  . Diabetes Unknown        brothers  . Stroke Brother   . Heart failure Brother   . Lupus Sister        ADVANCED DIRECTIVES:    HEALTH MAINTENANCE: Social History   Tobacco Use  . Smoking status: Former Research scientist (life sciences)  . Smokeless tobacco: Never Used  . Tobacco comment: quit 2006  Substance Use Topics  . Alcohol use: No    Comment: former  . Drug use: No     Allergies  Allergen Reactions  .  No Known Allergies     Current Outpatient Medications  Medication Sig Dispense Refill  . alendronate (FOSAMAX) 70 MG tablet TAKE 1 TABLET WEEKLY 30 MINUTES BEFORE BREAKFAST WITH A FULL GLASS OFWATER DO NOT LIE DOWN FOR 30 MINUTES 4 tablet 5  . aspirin 81 MG tablet Take 81 mg by mouth daily.    . furosemide (LASIX) 40 MG tablet Take 40 mg by mouth daily.     . isosorbide mononitrate (IMDUR) 30 MG 24 hr tablet Take 1 tablet by mouth Daily.    Marland Kitchen KLOR-CON M20 20 MEQ tablet Take 1 tablet by mouth Daily.    Marland Kitchen lisinopril  (PRINIVIL,ZESTRIL) 10 MG tablet Take 2 tablets by mouth Daily.     . metoprolol succinate (TOPROL-XL) 50 MG 24 hr tablet Take 50 mg by mouth daily.     . MULTIPLE VITAMIN PO Take by mouth daily.    Marland Kitchen spironolactone (ALDACTONE) 25 MG tablet Take 1 tablet by mouth Daily.    . tamoxifen (NOLVADEX) 20 MG tablet Take 1 tablet (20 mg total) by mouth daily. 30 tablet 11  . fluticasone (FLONASE) 50 MCG/ACT nasal spray Place 1 spray into both nostrils daily.    Marland Kitchen ibuprofen (ADVIL,MOTRIN) 200 MG tablet Take 200 mg by mouth every 6 (six) hours as needed.    . lovastatin (MEVACOR) 40 MG tablet Take 2 tablets by mouth Daily.     No current facility-administered medications for this visit.     OBJECTIVE: Vitals:   01/22/17 0913  BP: 122/74  Pulse: 63  Resp: 18  Temp: (!) 97.5 F (36.4 C)     Body mass index is 29.52 kg/m.    ECOG FS:0 - Asymptomatic  General: Well-developed, well-nourished, no acute distress. Sitting in wheelchair Eyes: Pink conjunctiva, anicteric sclera. Breasts: Left chest wall without evidence of recurrence.  Lungs: Clear to auscultation bilaterally. Heart: Regular rate and rhythm. No rubs, murmurs, or gallops. Abdomen: Soft, nontender, nondistended. No organomegaly noted, normoactive bowel sounds. Musculoskeletal: No edema, cyanosis, or clubbing. Neuro: Alert, answering all questions appropriately. Cranial nerves grossly intact. Skin: No rashes or petechiae noted. Psych: Normal affect.   LAB RESULTS:  Lab Results  Component Value Date   NA 134 (L) 03/24/2016   K 4.0 03/24/2016   CL 101 03/24/2016   CO2 29 03/24/2016   GLUCOSE 93 03/24/2016   BUN 29 (H) 03/24/2016   CREATININE 1.06 (H) 03/24/2016   CALCIUM 9.3 03/24/2016   PROT 7.6 03/24/2016   ALBUMIN 4.1 03/24/2016   AST 21 03/24/2016   ALT 14 03/24/2016   ALKPHOS 32 (L) 03/24/2016   BILITOT 0.5 03/24/2016   GFRNONAA 49 (L) 03/24/2016   GFRAA 56 (L) 03/24/2016    Lab Results  Component Value Date    WBC 5.1 03/24/2016   NEUTROABS 3.0 03/24/2016   HGB 13.2 03/24/2016   HCT 38.5 03/24/2016   MCV 95.2 03/24/2016   PLT 173 03/24/2016   Lab Results  Component Value Date   LABCA2 24.4 03/24/2016     STUDIES: No results found.  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  PLAN:    1. 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 Despite this, left breast mastectomy revealed  significant residual disease. Patient underwent total mastectomy on December 13, 2013.  Adjuvant XRT was recommended, but patient refused. She acknowledged that this would increase her risk of recurrence but she still declined. Given patient's worsening osteoporosis, anastrozole was discontinued and patient is now on tamoxifen. She will complete her hormonal therapy in January 2021. CA-27-29 continues to be within normal limits.  Right breast screening mammogram on May 20, 2016 reported BIRADS-1, repeat in May 2019. Return to clinic in 6 months for further evaluation.  2. Osteoporosis: Bone mineral density on May 20, 2016 revealed a T score of -3.1. Which is slightly worse than previous. Continue Fosamax, calcium, and vitamin D. Discontinue letrozole and initiated tamoxifen as above. Repeat in May 2019. 3. Family history: Patient may benefit from genetic testing in the future. 4. Hypertension: Patient's blood pressure is within normal limits today. Continue current medications as prescribed.   Patient expressed understanding and was in agreement with this plan. She also understands that She can call clinic at any time with any questions, concerns, or  complaints.   Breast cancer   Staging form: Breast, AJCC 7th Edition     Clinical stage from 08/01/2014: Stage IIIA (T1c, N2a, M0) - Signed by Lloyd Huger, MD on 08/01/2014   Lloyd Huger, MD   01/22/2017 9:38 AM

## 2017-01-23 ENCOUNTER — Telehealth: Payer: Self-pay

## 2017-01-23 NOTE — Telephone Encounter (Signed)
Attempted to contact patient to advise of colonoscopy results per Dr. Vicente Males. No Voicemail and no answer.    - inform Polyp was hyperplastic- would not recommend further colonoscopy for colon cancer screening in view of age   Mailed letter with the information to patient.

## 2017-05-21 ENCOUNTER — Ambulatory Visit: Payer: BC Managed Care – PPO | Attending: Oncology

## 2017-06-01 ENCOUNTER — Other Ambulatory Visit: Payer: Self-pay | Admitting: Oncology

## 2017-06-28 ENCOUNTER — Other Ambulatory Visit: Payer: Self-pay | Admitting: Oncology

## 2017-07-14 ENCOUNTER — Ambulatory Visit
Admission: RE | Admit: 2017-07-14 | Discharge: 2017-07-14 | Disposition: A | Discharge status: Home / Self Care | Payer: Medicare HMO | Source: Ambulatory Visit | Attending: Oncology | Admitting: Oncology

## 2017-07-14 DIAGNOSIS — M85851 Other specified disorders of bone density and structure, right thigh: Secondary | ICD-10-CM | POA: Diagnosis not present

## 2017-07-14 DIAGNOSIS — Z13820 Encounter for screening for osteoporosis: Secondary | ICD-10-CM | POA: Insufficient documentation

## 2017-07-14 DIAGNOSIS — M810 Age-related osteoporosis without current pathological fracture: Secondary | ICD-10-CM | POA: Diagnosis not present

## 2017-07-14 DIAGNOSIS — Z7983 Long term (current) use of bisphosphonates: Secondary | ICD-10-CM | POA: Insufficient documentation

## 2017-07-14 DIAGNOSIS — Z1231 Encounter for screening mammogram for malignant neoplasm of breast: Secondary | ICD-10-CM | POA: Insufficient documentation

## 2017-07-14 DIAGNOSIS — C50212 Malignant neoplasm of upper-inner quadrant of left female breast: Secondary | ICD-10-CM

## 2017-07-14 DIAGNOSIS — Z853 Personal history of malignant neoplasm of breast: Secondary | ICD-10-CM | POA: Insufficient documentation

## 2017-07-19 ENCOUNTER — Emergency Department: Payer: Medicare HMO

## 2017-07-19 ENCOUNTER — Inpatient Hospital Stay
Admission: EM | Admit: 2017-07-19 | Discharge: 2017-07-23 | DRG: 481 | Disposition: A | Discharge status: Skilled Nursing Facility | Payer: Medicare HMO | Attending: Internal Medicine | Admitting: Internal Medicine

## 2017-07-19 ENCOUNTER — Encounter: Payer: Self-pay | Admitting: Emergency Medicine

## 2017-07-19 DIAGNOSIS — I480 Paroxysmal atrial fibrillation: Secondary | ICD-10-CM | POA: Diagnosis present

## 2017-07-19 DIAGNOSIS — Z820 Family history of epilepsy and other diseases of the nervous system: Secondary | ICD-10-CM

## 2017-07-19 DIAGNOSIS — M810 Age-related osteoporosis without current pathological fracture: Secondary | ICD-10-CM | POA: Diagnosis present

## 2017-07-19 DIAGNOSIS — E785 Hyperlipidemia, unspecified: Secondary | ICD-10-CM | POA: Diagnosis present

## 2017-07-19 DIAGNOSIS — Z79899 Other long term (current) drug therapy: Secondary | ICD-10-CM

## 2017-07-19 DIAGNOSIS — Z87891 Personal history of nicotine dependence: Secondary | ICD-10-CM

## 2017-07-19 DIAGNOSIS — I5023 Acute on chronic systolic (congestive) heart failure: Secondary | ICD-10-CM | POA: Diagnosis present

## 2017-07-19 DIAGNOSIS — Z823 Family history of stroke: Secondary | ICD-10-CM

## 2017-07-19 DIAGNOSIS — I110 Hypertensive heart disease with heart failure: Secondary | ICD-10-CM | POA: Diagnosis present

## 2017-07-19 DIAGNOSIS — Z85038 Personal history of other malignant neoplasm of large intestine: Secondary | ICD-10-CM

## 2017-07-19 DIAGNOSIS — R52 Pain, unspecified: Secondary | ICD-10-CM

## 2017-07-19 DIAGNOSIS — Z832 Family history of diseases of the blood and blood-forming organs and certain disorders involving the immune mechanism: Secondary | ICD-10-CM

## 2017-07-19 DIAGNOSIS — Z853 Personal history of malignant neoplasm of breast: Secondary | ICD-10-CM

## 2017-07-19 DIAGNOSIS — F329 Major depressive disorder, single episode, unspecified: Secondary | ICD-10-CM | POA: Diagnosis present

## 2017-07-19 DIAGNOSIS — I429 Cardiomyopathy, unspecified: Secondary | ICD-10-CM | POA: Diagnosis present

## 2017-07-19 DIAGNOSIS — M9711XA Periprosthetic fracture around internal prosthetic right knee joint, initial encounter: Secondary | ICD-10-CM | POA: Diagnosis not present

## 2017-07-19 DIAGNOSIS — I5022 Chronic systolic (congestive) heart failure: Secondary | ICD-10-CM | POA: Diagnosis present

## 2017-07-19 DIAGNOSIS — J449 Chronic obstructive pulmonary disease, unspecified: Secondary | ICD-10-CM | POA: Diagnosis present

## 2017-07-19 DIAGNOSIS — Z950 Presence of cardiac pacemaker: Secondary | ICD-10-CM

## 2017-07-19 DIAGNOSIS — Z9012 Acquired absence of left breast and nipple: Secondary | ICD-10-CM

## 2017-07-19 DIAGNOSIS — Z419 Encounter for procedure for purposes other than remedying health state, unspecified: Secondary | ICD-10-CM

## 2017-07-19 DIAGNOSIS — Z7982 Long term (current) use of aspirin: Secondary | ICD-10-CM

## 2017-07-19 DIAGNOSIS — R609 Edema, unspecified: Secondary | ICD-10-CM

## 2017-07-19 DIAGNOSIS — Z01811 Encounter for preprocedural respiratory examination: Secondary | ICD-10-CM

## 2017-07-19 DIAGNOSIS — Z90722 Acquired absence of ovaries, bilateral: Secondary | ICD-10-CM

## 2017-07-19 DIAGNOSIS — Z8249 Family history of ischemic heart disease and other diseases of the circulatory system: Secondary | ICD-10-CM

## 2017-07-19 DIAGNOSIS — S7290XA Unspecified fracture of unspecified femur, initial encounter for closed fracture: Secondary | ICD-10-CM | POA: Diagnosis present

## 2017-07-19 DIAGNOSIS — I10 Essential (primary) hypertension: Secondary | ICD-10-CM | POA: Diagnosis present

## 2017-07-19 DIAGNOSIS — Z803 Family history of malignant neoplasm of breast: Secondary | ICD-10-CM

## 2017-07-19 DIAGNOSIS — Z7983 Long term (current) use of bisphosphonates: Secondary | ICD-10-CM

## 2017-07-19 DIAGNOSIS — Z90710 Acquired absence of both cervix and uterus: Secondary | ICD-10-CM

## 2017-07-19 DIAGNOSIS — Z96651 Presence of right artificial knee joint: Secondary | ICD-10-CM | POA: Diagnosis present

## 2017-07-19 DIAGNOSIS — Z9221 Personal history of antineoplastic chemotherapy: Secondary | ICD-10-CM

## 2017-07-19 DIAGNOSIS — Z9049 Acquired absence of other specified parts of digestive tract: Secondary | ICD-10-CM

## 2017-07-19 DIAGNOSIS — S72401A Unspecified fracture of lower end of right femur, initial encounter for closed fracture: Secondary | ICD-10-CM

## 2017-07-19 LAB — CBC
HEMATOCRIT: 34.5 % — AB (ref 35.0–47.0)
HEMOGLOBIN: 12.3 g/dL (ref 12.0–16.0)
MCH: 34.2 pg — ABNORMAL HIGH (ref 26.0–34.0)
MCHC: 35.7 g/dL (ref 32.0–36.0)
MCV: 95.7 fL (ref 80.0–100.0)
Platelets: 159 10*3/uL (ref 150–440)
RBC: 3.6 MIL/uL — AB (ref 3.80–5.20)
RDW: 14.8 % — ABNORMAL HIGH (ref 11.5–14.5)
WBC: 7.4 10*3/uL (ref 3.6–11.0)

## 2017-07-19 LAB — COMPREHENSIVE METABOLIC PANEL
ALBUMIN: 3.3 g/dL — AB (ref 3.5–5.0)
ALK PHOS: 23 U/L — AB (ref 38–126)
ALT: 13 U/L (ref 0–44)
AST: 22 U/L (ref 15–41)
Anion gap: 6 (ref 5–15)
BUN: 23 mg/dL (ref 8–23)
CALCIUM: 7.6 mg/dL — AB (ref 8.9–10.3)
CO2: 23 mmol/L (ref 22–32)
CREATININE: 0.96 mg/dL (ref 0.44–1.00)
Chloride: 108 mmol/L (ref 98–111)
GFR calc Af Amer: >60 mL/min (ref >60)
GFR calc non Af Amer: 54 mL/min — ABNORMAL LOW (ref >60)
GLUCOSE: 96 mg/dL (ref 70–99)
Potassium: 3.2 mmol/L — ABNORMAL LOW (ref 3.5–5.1)
SODIUM: 137 mmol/L (ref 135–145)
Total Bilirubin: 0.6 mg/dL (ref 0.3–1.2)
Total Protein: 5.6 g/dL — ABNORMAL LOW (ref 6.5–8.1)

## 2017-07-19 MED ORDER — ONDANSETRON HCL 4 MG/2ML IJ SOLN
INTRAMUSCULAR | Status: AC
  Filled 2017-07-19: qty 2

## 2017-07-19 MED ORDER — ONDANSETRON HCL 4 MG/2ML IJ SOLN
4.0000 mg | Freq: Once | INTRAMUSCULAR | Status: AC
  Administered 2017-07-19: 4 mg via INTRAVENOUS
  Filled 2017-07-19: qty 2

## 2017-07-19 MED ORDER — MORPHINE SULFATE (PF) 4 MG/ML IV SOLN
4.0000 mg | Freq: Once | INTRAVENOUS | Status: AC
Start: 2017-07-19 — End: 2017-07-19
  Administered 2017-07-19: 4 mg via INTRAVENOUS
  Filled 2017-07-19: qty 1

## 2017-07-19 MED ORDER — MORPHINE SULFATE (PF) 4 MG/ML IV SOLN
INTRAVENOUS | Status: AC
  Filled 2017-07-19: qty 1

## 2017-07-19 MED ORDER — MORPHINE SULFATE (PF) 2 MG/ML IV SOLN
2.0000 mg | Freq: Once | INTRAVENOUS | Status: AC
  Administered 2017-07-20: 2 mg via INTRAVENOUS

## 2017-07-19 NOTE — ED Notes (Signed)
XR at bedside

## 2017-07-19 NOTE — ED Notes (Signed)
Patient transported to X-ray 

## 2017-07-19 NOTE — ED Notes (Signed)
Lab stated they need redrawn green top, patient @ XR

## 2017-07-19 NOTE — ED Notes (Addendum)
XR will obtain 2 right knee view instead of 4 because they found a right distal broken femur on patient.  MD notified, and I received VORB for 2 View DG Right Femur.

## 2017-07-19 NOTE — ED Triage Notes (Signed)
Patient arrived from home, stating she was getting on her knees tonight to pray and near the end of movement had her body weight fall on her right knee that had a replacement 20 yr ago.  Pt co pain, and right knee is swollen.  Pt denies any other pain or complaint and upon palpation of right hip patient did not have any pain expression.

## 2017-07-19 NOTE — ED Notes (Signed)
Family at bedside. 

## 2017-07-19 NOTE — ED Provider Notes (Signed)
[facility] Emergency Department Provider Note _____   First MD Initiated Contact with Patient 07/19/17 2339     (approximate)  I have reviewed the triage vital signs and the nursing notes.   HISTORY  Chief Complaint Knee Pain (Right Side)   HPI Julia Gordon is a 83 y.o. female with below list of chronic medical conditions presents to the emergency department with 10 out of 10 right leg pain.  Patient states that she was kneeling down to see her prior and then subsequently fell onto the knee with resultant pain and inability to fully straighten the leg.  Patient states that pain is worse with any movement of the leg.   Past Medical History:  Diagnosis Date  . Anemia   . Atrial fibrillation ([place])    history of  . Breast cancer ([place]) 2015   LT MASTECTOMY  . 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 COPD (chronic obstructive pulmonary disease) ([place])   . Depression   . DJD (degenerative joint disease) of knee    right knee  . H/O hysterectomy with oophorectomy    for DUB  . History of depression   . HLD (hyperlipidemia)   . HTN (hypertension)   . LV (left ventricular) mural thrombus    history of, resolved (echo 04/09)  . Other primary cardiomyopathies   . Pacemaker 05/2007   [employer] ICD, Guidant lead, [facility] Dr Isaias Sakai  . Personal history of chemotherapy 2015   BREAST CA    Patient Active Problem List   Diagnosis Date Noted  . Primary cancer of upper inner quadrant of left female breast ([place]) 07/17/2014  . Chronic systolic congestive heart failure ([place]) 03/24/2011  . ICD (implantable cardiac defibrillator) in place 03/24/2011  . Dyslipidemia 03/24/2011  . Other primary cardiomyopathies     Past Surgical History:  Procedure Laterality Date  . ABDOMINAL HYSTERECTOMY    . BREAST BIOPSY Left 05/10/13   [place] bx-positive  . COLONOSCOPY WITH PROPOFOL N/A 01/15/2017   Procedure: COLONOSCOPY WITH PROPOFOL;  Surgeon: Jonathon Bellows, MD;  Location:  [facility] ENDOSCOPY;  Service: Gastroenterology;  Laterality: N/A;  . LAPAROSCOPIC RIGHT COLON RESECTION  08/10   Dr Pat Patrick, for adenoca   . MASTECTOMY Left 2015   BREAST CA  . OOPHORECTOMY    . PACEMAKER INSERTION  2009  . REPLACEMENT TOTAL KNEE  1990's   right     Prior to Admission medications   Medication Sig Start Date End Date Taking? Authorizing Provider  alendronate (FOSAMAX) 70 MG tablet TAKE 1 TAB WEEKLY 30 MINS BEFORE BREAKFAST WITH A FULL GLASS OF WATER. DO NOT LIE DOWN FOR 30 MINS 06/28/17   Lloyd Huger, MD  aspirin 81 MG tablet Take 81 mg by mouth daily.    [provider]  fluticasone (FLONASE) 50 MCG/ACT nasal spray Place 1 spray into both nostrils daily.    [provider]  furosemide (LASIX) 40 MG tablet Take 40 mg by mouth daily.  10/09/14   [provider]  ibuprofen (ADVIL,MOTRIN) 200 MG tablet Take 200 mg by mouth every 6 (six) hours as needed.    [provider]  isosorbide mononitrate (IMDUR) 30 MG 24 hr tablet Take 1 tablet by mouth Daily. 01/31/11   [provider]  KLOR-CON M20 20 MEQ tablet Take 1 tablet by mouth Daily. 03/03/11   [provider]  lisinopril (PRINIVIL,ZESTRIL) 10 MG tablet Take 2 tablets by mouth Daily.  03/03/11   [provider]  lovastatin (MEVACOR) 40 MG tablet Take 2 tablets by mouth Daily. 01/31/11   [provider]  metoprolol succinate (TOPROL-XL) 50 MG 24 hr tablet Take 50 mg by mouth daily.  07/11/14   [provider]  MULTIPLE VITAMIN PO Take by mouth daily.    [provider]  spironolactone (ALDACTONE) 25 MG tablet Take 1 tablet by mouth Daily. 03/03/11   [provider]  tamoxifen (NOLVADEX) 20 MG tablet Take 1 tablet (20 mg total) by mouth daily. 06/24/16   Lloyd Huger, MD    Allergies No known drug allergies  Family History  Problem Relation Age of Onset  . Hypertension Father   . Alzheimer's disease Mother   . Breast cancer  Sister 36  . Diabetes Unknown        brothers  . Stroke Brother   . Heart failure Brother   . Lupus Sister     Social History Social History   Tobacco Use  . Smoking status: Former Research scientist (life sciences)  . Smokeless tobacco: Never Used  . Tobacco comment: quit 2006  Substance Use Topics  . Alcohol use: No    Comment: former  . Drug use: No    Review of Systems Constitutional: No fever/chills Eyes: No visual changes. ENT: No sore throat. Cardiovascular: Denies chest pain. Respiratory: Denies shortness of breath. Gastrointestinal: No abdominal pain.  No nausea, no vomiting.  No diarrhea.  No constipation. Genitourinary: Negative for dysuria. Musculoskeletal: Positive for right thigh pain. Integumentary: Negative for rash. Neurological: Negative for headaches, focal weakness or numbness.   ____________________________________________   PHYSICAL EXAM:  VITAL SIGNS: ED Triage Vitals  Enc Vitals Group     BP 07/19/17 2206 (!) 143/91     Pulse Rate 07/19/17 2206 (!) 56     Resp 07/19/17 2206 18     Temp 07/19/17 2206 (!) 97.4 F (36.3 C)     Temp Source 07/19/17 2206 Oral     SpO2 07/19/17 2205 100 %     Weight 07/19/17 2207 72.6 kg (160 lb)     Height 07/19/17 2207 1.575 m (5\' 2" )     Head Circumference --      Peak Flow --      Pain Score 07/19/17 2207 9     Pain Loc --      Pain Edu? --      Excl. in [place]? --     Constitutional: Alert and oriented.  Apparent discomfort eyes: Conjunctivae are normal.  Head: Atraumatic. Mouth/Throat: Mucous membranes are moist.  Oropharynx non-erythematous. Neck: No stridor.  Cardiovascular: Normal rate, regular rhythm. Good peripheral circulation. Grossly normal heart sounds. Respiratory: Normal respiratory effort.  No retractions. Lungs CTAB. Gastrointestinal: Soft and nontender. No distention.  Musculoskeletal: Right distal femur pain.  Knee held in flexion.  Pain with active and passive range of motion. Neurologic:  Normal speech and  language. No gross focal neurologic deficits are appreciated.  Skin:  Skin is warm, dry and intact. No rash noted. Psychiatric: Mood and affect are normal. Speech and behavior are normal.  ____________________________________________   LABS (all labs ordered are listed, but only abnormal results are displayed)  Labs Reviewed  CBC - Abnormal; Notable for the following components:      Result Value   RBC 3.60 (*)    HCT 34.5 (*)    MCH 34.2 (*)    RDW 14.8 (*)    All other components within normal limits  COMPREHENSIVE METABOLIC PANEL -  Abnormal; Notable for the following components:   Potassium 3.2 (*)    Calcium 7.6 (*)    Total Protein 5.6 (*)    Albumin 3.3 (*)    Alkaline Phosphatase 23 (*)    GFR calc non Af Amer 54 (*)    All other components within normal limits   ____________________________________________  EKG  ED ECG REPORT I, Hamilton N Fitzgibbon, the attending physician, personally viewed and interpreted this ECG.   Date: 07/19/2017  EKG Time: 12:01 AM  Rate: 54  Rhythm: Sinus bradycardia  Axis: Normal  Intervals: Normal  ST&T Change: None  ____________________________________________  RADIOLOGY I, Goodman N Novitsky, personally viewed and evaluated these images (plain radiographs) as part of my medical decision making, as well as reviewing the written report by the radiologist.  ED MD interpretation: Right knee x-ray revealed comminuted distal femur fracture with lateral displacement.  Official radiology report(s): Dg Chest 1 View  Result Date: 07/19/2017 CLINICAL DATA:  RT distal femur pain and swelling after fall tonight; image of lateral knee joint obtained with RT knee exam performed just prior to this exam Hx/o a-fib, breast cancer, LT side mastectomy; COPD, colon adenocarcinoma Former smoker EXAM: CHEST  1 VIEW COMPARISON:  None. FINDINGS: Right sided Port-A-Cath terminates at the low SVC. Single lead pacer/AICD device. Patient rotated right. Mild  cardiomegaly with a tortuous thoracic aorta. No pleural effusion or pneumothorax. Mild right hemidiaphragm elevation. Clear lungs. IMPRESSION: No acute cardiopulmonary disease. Cardiomegaly and aortic tortuosity. Electronically Signed   By: Abigail Miyamoto M.D.   On: 07/19/2017 23:29   Dg Knee 1-2 Views Right  Result Date: 07/19/2017 CLINICAL DATA:  Pain and swelling of the right distal femur. Leg buckled while trying to kneel down today. EXAM: RIGHT KNEE - 1-2 VIEW COMPARISON:  None. FINDINGS: An acute, closed, comminuted fracture of the distal metadiaphysis of the right femur is noted with 1 shaft width lateral displacement of the femoral condyles relative to the femoral shaft. Intact total knee arthroplasty without prosthetic loosening nor hardware failure. The proximal tibia and fibula appear intact. Femoral through tibial arteriosclerosis is noted. No knee joint effusion. IMPRESSION: 1. Acute, closed, comminuted fracture of the distal metadiaphysis of the right femur with 1 shaft width lateral displacement of the femoral condyles relative to the femoral shaft. 2. Intact right total knee arthroplasty without fracture involvement. Electronically Signed   By: Ashley Royalty M.D.   On: 07/19/2017 23:05   Dg Femur Portable Min 2 Views Right  Result Date: 07/19/2017 CLINICAL DATA:  Pain after fall EXAM: RIGHT FEMUR PORTABLE 2 VIEW COMPARISON:  None. FINDINGS: Acute, closed, dorsally angulated and 1/2 shaft width displaced, fracture through the distal femoral metadiaphysis is noted, just proximal to a total knee arthroplasty. There is slight comminution of the fracture. No fracture involvement of the adjacent knee arthroplasty is seen. The right hip joint is maintained and aligned. No suspicious osseous lesions are seen. IMPRESSION: Acute, closed, comminuted metadiaphyseal fracture of the right femur with dorsal angulation and 1/2 shaft width displacement. Electronically Signed   By: Ashley Royalty M.D.   On:  07/19/2017 23:28    ____________________________________________   PROCEDURES   Reduction of right femur fracture Date/Time: 07/20/2017 2:00 AM Performed by: Gregor Hams, MD Authorized by: Gregor Hams, MD  Consent: Verbal consent obtained. Patient identity confirmed: verbally with patient and arm band Local anesthesia used: no  Anesthesia: Local anesthesia used: no  Sedation: Patient sedated: no  Patient tolerance: Patient tolerated  the procedure well with no immediate complications      ____________________________________________   INITIAL IMPRESSION / ASSESSMENT AND PLAN / ED COURSE  As part of my medical decision making, I reviewed the following data within the [place] NUMBER   83 year old female presenting with above-stated history and physical exam secondary to right knee/thigh pain.  X-ray consistent with a comminuted distal right femur fracture just superior to the patient's knee replacement.  Patient received IV morphine 4 mg with subsequent reduction knee immobilizer applied.    ____________________________________________  FINAL CLINICAL IMPRESSION(S) / ED DIAGNOSES  Final diagnoses:  Pain  Swelling  Pre-op chest exam     MEDICATIONS GIVEN DURING THIS VISIT:  Medications  morphine 4 MG/ML injection 4 mg (4 mg Intravenous Given 07/19/17 2250)  ondansetron (ZOFRAN) injection 4 mg (4 mg Intravenous Given 07/19/17 2250)     ED Discharge Orders    None       Note:  This document was prepared using Dragon voice recognition software and may include unintentional dictation errors.    Gregor Hams, MD 07/20/17 [phone]

## 2017-07-20 ENCOUNTER — Other Ambulatory Visit: Payer: Self-pay

## 2017-07-20 ENCOUNTER — Encounter: Payer: Self-pay | Admitting: Internal Medicine

## 2017-07-20 DIAGNOSIS — Z9221 Personal history of antineoplastic chemotherapy: Secondary | ICD-10-CM | POA: Diagnosis not present

## 2017-07-20 DIAGNOSIS — Z9012 Acquired absence of left breast and nipple: Secondary | ICD-10-CM | POA: Diagnosis not present

## 2017-07-20 DIAGNOSIS — J449 Chronic obstructive pulmonary disease, unspecified: Secondary | ICD-10-CM | POA: Diagnosis present

## 2017-07-20 DIAGNOSIS — Z820 Family history of epilepsy and other diseases of the nervous system: Secondary | ICD-10-CM | POA: Diagnosis not present

## 2017-07-20 DIAGNOSIS — Z90710 Acquired absence of both cervix and uterus: Secondary | ICD-10-CM | POA: Diagnosis not present

## 2017-07-20 DIAGNOSIS — Z87891 Personal history of nicotine dependence: Secondary | ICD-10-CM | POA: Diagnosis not present

## 2017-07-20 DIAGNOSIS — Z803 Family history of malignant neoplasm of breast: Secondary | ICD-10-CM | POA: Diagnosis not present

## 2017-07-20 DIAGNOSIS — R609 Edema, unspecified: Secondary | ICD-10-CM | POA: Diagnosis present

## 2017-07-20 DIAGNOSIS — I110 Hypertensive heart disease with heart failure: Secondary | ICD-10-CM | POA: Diagnosis present

## 2017-07-20 DIAGNOSIS — Z8249 Family history of ischemic heart disease and other diseases of the circulatory system: Secondary | ICD-10-CM | POA: Diagnosis not present

## 2017-07-20 DIAGNOSIS — E785 Hyperlipidemia, unspecified: Secondary | ICD-10-CM | POA: Diagnosis present

## 2017-07-20 DIAGNOSIS — I429 Cardiomyopathy, unspecified: Secondary | ICD-10-CM | POA: Diagnosis present

## 2017-07-20 DIAGNOSIS — Z950 Presence of cardiac pacemaker: Secondary | ICD-10-CM | POA: Diagnosis not present

## 2017-07-20 DIAGNOSIS — S7290XA Unspecified fracture of unspecified femur, initial encounter for closed fracture: Secondary | ICD-10-CM | POA: Diagnosis present

## 2017-07-20 DIAGNOSIS — Z90722 Acquired absence of ovaries, bilateral: Secondary | ICD-10-CM | POA: Diagnosis not present

## 2017-07-20 DIAGNOSIS — I5022 Chronic systolic (congestive) heart failure: Secondary | ICD-10-CM | POA: Diagnosis present

## 2017-07-20 DIAGNOSIS — Z832 Family history of diseases of the blood and blood-forming organs and certain disorders involving the immune mechanism: Secondary | ICD-10-CM | POA: Diagnosis not present

## 2017-07-20 DIAGNOSIS — F329 Major depressive disorder, single episode, unspecified: Secondary | ICD-10-CM | POA: Diagnosis present

## 2017-07-20 DIAGNOSIS — Z96651 Presence of right artificial knee joint: Secondary | ICD-10-CM | POA: Diagnosis present

## 2017-07-20 DIAGNOSIS — Z853 Personal history of malignant neoplasm of breast: Secondary | ICD-10-CM | POA: Diagnosis not present

## 2017-07-20 DIAGNOSIS — Z823 Family history of stroke: Secondary | ICD-10-CM | POA: Diagnosis not present

## 2017-07-20 DIAGNOSIS — M810 Age-related osteoporosis without current pathological fracture: Secondary | ICD-10-CM | POA: Diagnosis present

## 2017-07-20 DIAGNOSIS — M9711XA Periprosthetic fracture around internal prosthetic right knee joint, initial encounter: Secondary | ICD-10-CM | POA: Diagnosis present

## 2017-07-20 DIAGNOSIS — I10 Essential (primary) hypertension: Secondary | ICD-10-CM | POA: Diagnosis present

## 2017-07-20 DIAGNOSIS — I480 Paroxysmal atrial fibrillation: Secondary | ICD-10-CM | POA: Diagnosis present

## 2017-07-20 DIAGNOSIS — Z9049 Acquired absence of other specified parts of digestive tract: Secondary | ICD-10-CM | POA: Diagnosis not present

## 2017-07-20 LAB — BASIC METABOLIC PANEL
Anion gap: 9 (ref 5–15)
BUN: 21 mg/dL (ref 8–23)
CHLORIDE: 103 mmol/L (ref 98–111)
CO2: 25 mmol/L (ref 22–32)
Calcium: 8.6 mg/dL — ABNORMAL LOW (ref 8.9–10.3)
Creatinine, Ser: 1.02 mg/dL — ABNORMAL HIGH (ref 0.44–1.00)
GFR calc Af Amer: 58 mL/min — ABNORMAL LOW (ref >60)
GFR, EST NON AFRICAN AMERICAN: 50 mL/min — AB (ref >60)
GLUCOSE: 116 mg/dL — AB (ref 70–99)
POTASSIUM: 3.6 mmol/L (ref 3.5–5.1)
Sodium: 137 mmol/L (ref 135–145)

## 2017-07-20 LAB — CBC
HEMATOCRIT: 33.8 % — AB (ref 35.0–47.0)
HEMOGLOBIN: 12 g/dL (ref 12.0–16.0)
MCH: 34 pg (ref 26.0–34.0)
MCHC: 35.4 g/dL (ref 32.0–36.0)
MCV: 96.3 fL (ref 80.0–100.0)
Platelets: 142 10*3/uL — ABNORMAL LOW (ref 150–440)
RBC: 3.51 MIL/uL — ABNORMAL LOW (ref 3.80–5.20)
RDW: 14.6 % — AB (ref 11.5–14.5)
WBC: 7.4 10*3/uL (ref 3.6–11.0)

## 2017-07-20 LAB — MRSA PCR SCREENING: MRSA by PCR: NEGATIVE

## 2017-07-20 MED ORDER — OCUVITE-LUTEIN PO CAPS
1.0000 | ORAL_CAPSULE | Freq: Every day | ORAL | Status: DC
  Administered 2017-07-20 – 2017-07-22 (×2): 1 via ORAL
  Filled 2017-07-20 (×4): qty 1

## 2017-07-20 MED ORDER — MORPHINE SULFATE (PF) 2 MG/ML IV SOLN
2.0000 mg | Freq: Once | INTRAVENOUS | Status: AC
  Administered 2017-07-20: 2 mg via INTRAVENOUS
  Filled 2017-07-20: qty 1

## 2017-07-20 MED ORDER — POTASSIUM CHLORIDE CRYS ER 20 MEQ PO TBCR
20.0000 meq | EXTENDED_RELEASE_TABLET | Freq: Every day | ORAL | Status: DC
  Administered 2017-07-20: 20 meq via ORAL
  Filled 2017-07-20: qty 1

## 2017-07-20 MED ORDER — SPIRONOLACTONE 25 MG PO TABS
50.0000 mg | ORAL_TABLET | Freq: Every day | ORAL | Status: DC
  Administered 2017-07-20: 50 mg via ORAL
  Filled 2017-07-20: qty 2

## 2017-07-20 MED ORDER — LISINOPRIL 20 MG PO TABS
20.0000 mg | ORAL_TABLET | Freq: Every day | ORAL | Status: DC
  Administered 2017-07-20: 20 mg via ORAL
  Filled 2017-07-20: qty 1

## 2017-07-20 MED ORDER — ONDANSETRON HCL 4 MG PO TABS: 4.0000 mg | ORAL_TABLET | Freq: Four times a day (QID) | ORAL | Status: DC | PRN

## 2017-07-20 MED ORDER — METOPROLOL SUCCINATE ER 50 MG PO TB24
50.0000 mg | ORAL_TABLET | Freq: Every day | ORAL | Status: DC
  Administered 2017-07-20: 50 mg via ORAL
  Filled 2017-07-20 (×2): qty 1

## 2017-07-20 MED ORDER — ASPIRIN EC 81 MG PO TBEC: 81.0000 mg | DELAYED_RELEASE_TABLET | Freq: Every day | ORAL | Status: DC

## 2017-07-20 MED ORDER — FUROSEMIDE 40 MG PO TABS
40.0000 mg | ORAL_TABLET | Freq: Every day | ORAL | Status: DC
  Administered 2017-07-20: 40 mg via ORAL
  Filled 2017-07-20: qty 1

## 2017-07-20 MED ORDER — OXYCODONE HCL 5 MG PO TABS
5.0000 mg | ORAL_TABLET | ORAL | Status: DC | PRN
  Administered 2017-07-20 (×2): 5 mg via ORAL
  Filled 2017-07-20 (×2): qty 1

## 2017-07-20 MED ORDER — ONDANSETRON HCL 4 MG/2ML IJ SOLN: 4.0000 mg | Freq: Four times a day (QID) | INTRAMUSCULAR | Status: DC | PRN

## 2017-07-20 MED ORDER — CEFAZOLIN SODIUM-DEXTROSE 2-4 GM/100ML-% IV SOLN
2.0000 g | INTRAVENOUS | Status: AC
  Administered 2017-07-21: 2 g via INTRAVENOUS
  Filled 2017-07-20: qty 100

## 2017-07-20 MED ORDER — ACETAMINOPHEN 650 MG RE SUPP: 650.0000 mg | Freq: Four times a day (QID) | RECTAL | Status: DC | PRN

## 2017-07-20 MED ORDER — ACETAMINOPHEN 325 MG PO TABS
650.0000 mg | ORAL_TABLET | Freq: Four times a day (QID) | ORAL | Status: DC | PRN
  Administered 2017-07-21: 650 mg via ORAL

## 2017-07-20 MED ORDER — PRAVASTATIN SODIUM 20 MG PO TABS
40.0000 mg | ORAL_TABLET | Freq: Every day | ORAL | Status: DC
  Administered 2017-07-20 – 2017-07-22 (×3): 40 mg via ORAL
  Filled 2017-07-20 (×3): qty 2

## 2017-07-20 MED ORDER — ISOSORBIDE MONONITRATE ER 30 MG PO TB24
30.0000 mg | ORAL_TABLET | Freq: Every day | ORAL | Status: DC
  Administered 2017-07-20: 30 mg via ORAL
  Filled 2017-07-20: qty 1

## 2017-07-20 NOTE — Progress Notes (Signed)
Chaplain responded to an OR for an AD. Chaplain provided education and active listening . Pt has suffered loss of 2 children(one self inflicted last year) but her faith sustain her. She has a wonderful altitude a good sense of humor.  Daughter and niece came into the room at which point chaplain continued education. Th   07/20/17 1000  Clinical Encounter Type  Visited With Patient;Patient and family together  Visit Type Initial;Spiritual support  Referral From Nurse  Spiritual Encounters  Spiritual Needs Brochure;Prayer  ey may have it completed today. Chaplain prayed and Pt invited chaplain to come back. Chaplain will follow.    07/20/17 1000  Clinical Encounter Type  Visited With Patient;Patient and family together  Visit Type Initial;Spiritual support  Referral From Nurse  Spiritual Encounters  Spiritual Needs Brochure;Prayer

## 2017-07-20 NOTE — NC FL2 (Signed)
[place] LEVEL OF CARE SCREENING TOOL     IDENTIFICATION  Patient Name: Julia Gordon Birthdate: [birth year] Sex: female Admission Date (Current Location): 07/19/2017  [place] and [place] Number:  Engineering geologist and Address:  [facility], [address] [place], [place] [address]      Provider Number: [id number]  Attending Physician Name and Address:  Gladstone Lighter, MD  Relative Name and Phone Number:       Current Level of Care: Hospital Recommended Level of Care: [place] Prior Approval Number:    Date Approved/Denied:   PASRR Number: [id number])  Discharge Plan: SNF    Current Diagnoses: Patient Active Problem List   Diagnosis Date Noted  . Femur fracture ([place]) 07/20/2017  . HTN (hypertension) 07/20/2017  . PAF (paroxysmal atrial fibrillation) ([place]) 07/20/2017  . Primary cancer of upper inner quadrant of left female breast ([place]) 07/17/2014  . Chronic systolic congestive heart failure ([place]) 03/24/2011  . ICD (implantable cardiac defibrillator) in place 03/24/2011  . Dyslipidemia 03/24/2011  . Other primary cardiomyopathies     Orientation RESPIRATION BLADDER Height & Weight     Self, Time, Situation, Place  Normal Incontinent Weight: 160 lb (72.6 kg) Height:  5\' 2"  (157.5 cm)  BEHAVIORAL SYMPTOMS/MOOD NEUROLOGICAL BOWEL NUTRITION STATUS      Continent Diet(Diet: Heart Healthy )  AMBULATORY STATUS COMMUNICATION OF NEEDS Skin   Extensive Assist Verbally Surgical wounds                       Personal Care Assistance Level of Assistance  Bathing, Feeding, Dressing Bathing Assistance: Limited assistance Feeding assistance: Independent Dressing Assistance: Limited assistance     Functional Limitations Info  Sight, Hearing, Speech Sight Info: Adequate Hearing Info: Adequate Speech Info: Adequate    SPECIAL CARE FACTORS FREQUENCY  PT (By licensed PT), OT (By licensed OT)     PT  Frequency: (5) OT Frequency: (5)            Contractures      Additional Factors Info  Code Status, Allergies Code Status Info: (Full Code. ) Allergies Info: (No Known Allergies. )           Current Medications (07/20/2017):  This is the current hospital active medication list Current Facility-Administered Medications  Medication Dose Route Frequency Provider Last Rate Last Dose  . acetaminophen (TYLENOL) tablet 650 mg  650 mg Oral Q6H PRN Lance Coon, MD       Or  . acetaminophen (TYLENOL) suppository 650 mg  650 mg Rectal Q6H PRN Lance Coon, MD      . Derrill Memo ON 07/21/2017] aspirin EC tablet 81 mg  81 mg Oral Daily Lance Coon, MD      . furosemide (LASIX) tablet 40 mg  40 mg Oral Daily Lance Coon, MD      . isosorbide mononitrate (IMDUR) 24 hr tablet 30 mg  30 mg Oral Daily Lance Coon, MD      . lisinopril (PRINIVIL,ZESTRIL) tablet 20 mg  20 mg Oral Daily Lance Coon, MD      . metoprolol succinate (TOPROL-XL) 24 hr tablet 50 mg  50 mg Oral Daily Lance Coon, MD      . ondansetron [facility]) tablet 4 mg  4 mg Oral Q6H PRN Lance Coon, MD       Or  . ondansetron [facility]) injection 4 mg  4 mg Intravenous Q6H PRN Lance Coon, MD      .  oxyCODONE (Oxy IR/ROXICODONE) immediate release tablet 5 mg  5 mg Oral Q4H PRN Lance Coon, MD   5 mg at 07/20/17 0254  . pravastatin (PRAVACHOL) tablet 40 mg  40 mg Oral q1800 Lance Coon, MD      . spironolactone (ALDACTONE) tablet 50 mg  50 mg Oral Daily Lance Coon, MD         Discharge Medications: Please see discharge summary for a list of discharge medications.  Relevant Imaging Results:  Relevant Lab Results:   Additional Information (SSN: [id number])  Maury Bamba, Veronia Beets, LCSW

## 2017-07-20 NOTE — Anesthesia Preprocedure Evaluation (Addendum)
Anesthesia Evaluation  Patient identified by MRN, date of birth, ID band Patient awake    Reviewed: Allergy & Precautions, NPO status , Patient's Chart, lab work & pertinent test results  History of Anesthesia Complications Negative for: history of anesthetic complications  Airway Mallampati: II       Dental  (+) Missing, Chipped   Pulmonary neg sleep apnea, COPD,  COPD inhaler, former smoker,           Cardiovascular hypertension, Pt. on medications +CHF  (-) Past MI + pacemaker + Cardiac Defibrillator      Neuro/Psych neg Seizures PSYCHIATRIC DISORDERS Depression    GI/Hepatic Neg liver ROS, neg GERD  ,  Endo/Other  neg diabetes  Renal/GU negative Renal ROS     Musculoskeletal  (+) Arthritis ,   Abdominal   Peds  Hematology  (+) anemia ,   Anesthesia Other Findings Past Medical History: No date: Anemia No date: Atrial fibrillation ([place])     Comment:  history of 2015: Breast cancer ([place])     Comment:  LT MASTECTOMY No date: Colon adenocarcinoma ([place]) No date: COPD (chronic obstructive pulmonary disease) ([place]) No date: Depression No date: DJD (degenerative joint disease) of knee     Comment:  right knee No date: H/O hysterectomy with oophorectomy     Comment:  for DUB No date: History of depression No date: HLD (hyperlipidemia) No date: HTN (hypertension) No date: LV (left ventricular) mural thrombus     Comment:  history of, resolved (echo 04/09) No date: Other primary cardiomyopathies 05/2007: Pacemaker     Comment:  [place], [employer] lead, [facility] Dr Isaias Sakai 2015: Personal history of chemotherapy     Comment:  BREAST CA   Reproductive/Obstetrics                            Anesthesia Physical Anesthesia Plan  ASA: III  Anesthesia Plan: Spinal   Post-op Pain Management:    Induction:   PONV Risk Score and Plan: 3  Airway Management Planned:    Additional Equipment:   Intra-op Plan:   Post-operative Plan:   Informed Consent: I have reviewed the patients History and Physical, chart, labs and discussed the procedure including the risks, benefits and alternatives for the proposed anesthesia with the patient or authorized representative who has indicated his/her understanding and acceptance.     Plan Discussed with:   Anesthesia Plan Comments:         Anesthesia Quick Evaluation

## 2017-07-20 NOTE — Progress Notes (Signed)
[place] at [place] NAME: Julia Gordon    MR#:  [id number]  DATE OF BIRTH:  1934/04/13  SUBJECTIVE:  CHIEF COMPLAINT:   Chief Complaint  Patient presents with  . Knee Pain    Right Side   -   REVIEW OF SYSTEMS:  Review of Systems  Constitutional: Negative for chills, fever and malaise/fatigue.  HENT: Negative for congestion, ear discharge, hearing loss and nosebleeds.   Eyes: Negative for blurred vision and double vision.  Respiratory: Negative for cough, shortness of breath and wheezing.   Cardiovascular: Negative for chest pain, palpitations and leg swelling.  Gastrointestinal: Negative for abdominal pain, constipation, diarrhea, nausea and vomiting.  Genitourinary: Negative for dysuria.  Musculoskeletal: Positive for joint pain and myalgias.  Neurological: Negative for dizziness, seizures, weakness and headaches.  Psychiatric/Behavioral: Negative for depression.    DRUG ALLERGIES:   Allergies  Allergen Reactions  . No Known Allergies     VITALS:  Blood pressure (!) 146/71, pulse (!) 46, temperature 98 F (36.7 C), temperature source Oral, resp. rate 18, height 5\' 2"  (1.575 m), weight 72.6 kg (160 lb), SpO2 99 %.  PHYSICAL EXAMINATION:  Physical Exam  GENERAL:  83 y.o.-year-old patient lying in the bed with no acute distress.  EYES: Pupils equal, round, reactive to light and accommodation. No scleral icterus. Extraocular muscles intact.  HEENT: Head atraumatic, normocephalic. Oropharynx and nasopharynx clear.  NECK:  Supple, no jugular venous distention. No thyroid enlargement, no tenderness.  LUNGS: Normal breath sounds bilaterally, no wheezing, rales,rhonchi or crepitation. No use of accessory muscles of respiration. Decreased bibasilar breath sounds CARDIOVASCULAR: S1, S2 normal. No  rubs, or gallops. 2/6 systolic murmur ABDOMEN: Soft, nontender, nondistended. Bowel sounds present. No organomegaly or mass.    EXTREMITIES: right leg in brace- extended. No pedal edema, cyanosis, or clubbing.  NEUROLOGIC: Cranial nerves II through XII are intact. Muscle strength 5/5 in all extremities. Sensation intact. Gait not checked.  PSYCHIATRIC: The patient is alert and oriented x 3.  SKIN: No obvious rash, lesion, or ulcer.    LABORATORY PANEL:   CBC Recent Labs  Lab 07/20/17 0423  WBC 7.4  HGB 12.0  HCT 33.8*  PLT 142*   ------------------------------------------------------------------------------------------------------------------  Chemistries  Recent Labs  Lab 07/19/17 2209 07/20/17 0423  NA 137 137  K 3.2* 3.6  CL 108 103  CO2 23 25  GLUCOSE 96 116*  BUN 23 21  CREATININE 0.96 1.02*  CALCIUM 7.6* 8.6*  AST 22  --   ALT 13  --   ALKPHOS 23*  --   BILITOT 0.6  --    ------------------------------------------------------------------------------------------------------------------  Cardiac Enzymes No results for input(s): TROPONINI in the last 168 hours. ------------------------------------------------------------------------------------------------------------------  RADIOLOGY:  Dg Chest 1 View  Result Date: 07/19/2017 CLINICAL DATA:  RT distal femur pain and swelling after fall tonight; image of lateral knee joint obtained with RT knee exam performed just prior to this exam Hx/o a-fib, breast cancer, LT side mastectomy; COPD, colon adenocarcinoma Former smoker EXAM: CHEST  1 VIEW COMPARISON:  None. FINDINGS: Right sided Port-A-Cath terminates at the low SVC. Single lead pacer/AICD device. Patient rotated right. Mild cardiomegaly with a tortuous thoracic aorta. No pleural effusion or pneumothorax. Mild right hemidiaphragm elevation. Clear lungs. IMPRESSION: No acute cardiopulmonary disease. Cardiomegaly and aortic tortuosity. Electronically Signed   By: Abigail Miyamoto M.D.   On: 07/19/2017 23:29   Dg Knee 1-2 Views Right  Result Date: 07/19/2017 CLINICAL DATA:  Pain and swelling of  the right distal femur. Leg buckled while trying to kneel down today. EXAM: RIGHT KNEE - 1-2 VIEW COMPARISON:  None. FINDINGS: An acute, closed, comminuted fracture of the distal metadiaphysis of the right femur is noted with 1 shaft width lateral displacement of the femoral condyles relative to the femoral shaft. Intact total knee arthroplasty without prosthetic loosening nor hardware failure. The proximal tibia and fibula appear intact. Femoral through tibial arteriosclerosis is noted. No knee joint effusion. IMPRESSION: 1. Acute, closed, comminuted fracture of the distal metadiaphysis of the right femur with 1 shaft width lateral displacement of the femoral condyles relative to the femoral shaft. 2. Intact right total knee arthroplasty without fracture involvement. Electronically Signed   By: Ashley Royalty M.D.   On: 07/19/2017 23:05   Dg Femur Portable Min 2 Views Right  Result Date: 07/19/2017 CLINICAL DATA:  Pain after fall EXAM: RIGHT FEMUR PORTABLE 2 VIEW COMPARISON:  None. FINDINGS: Acute, closed, dorsally angulated and 1/2 shaft width displaced, fracture through the distal femoral metadiaphysis is noted, just proximal to a total knee arthroplasty. There is slight comminution of the fracture. No fracture involvement of the adjacent knee arthroplasty is seen. The right hip joint is maintained and aligned. No suspicious osseous lesions are seen. IMPRESSION: Acute, closed, comminuted metadiaphyseal fracture of the right femur with dorsal angulation and 1/2 shaft width displacement. Electronically Signed   By: Ashley Royalty M.D.   On: 07/19/2017 23:28    EKG:   Orders placed or performed during the hospital encounter of 07/19/17  . ED EKG  . ED EKG  . EKG 12-Lead  . EKG 12-Lead    ASSESSMENT AND PLAN:   83 year old female with past medical history significant for history of breast cancer status post mastectomy on the left, COPD, hypertension, atrial fibrillation not on anticoagulation presents to  hospital secondary to distal right femoral fracture.  1.  Right periprosthetic distal femur fracture-orthopedics consulted -For OR tomorrow -Continue pain control, DVT prophylaxis will be started after surgery and physical therapy after surgery as well -Acceptable risk for surgery  2.  Hypertension-continue Imdur, metoprolol, lisinopril  3.  Stage III adenocarcinoma of left breast-status post left mastectomy finished chemo.   -Continue to follow-up with oncology as prior scheduled  4.  Osteoporosis-continue Fosamax and calcium vitamin D supplements.  5. DVT Prophylaxis- TEDs and SCDs   All the records are reviewed and case discussed with Care Management/Social Workerr. Management plans discussed with the patient, family and they are in agreement.  CODE STATUS: Full Code  TOTAL TIME TAKING CARE OF THIS PATIENT: 37 minutes.   POSSIBLE D/C IN 2-3 DAYS, DEPENDING ON CLINICAL CONDITION.   Gladstone Lighter M.D on 07/20/2017 at 12:52 PM  Between 7am to 6pm - Pager - [phone]  After 6pm go to [email or web address] - password [email or web address] [place] [facility]  Office  [phone]  CC: Primary care physician; Ellamae Sia, MD

## 2017-07-20 NOTE — Clinical Social Work Note (Signed)
Clinical Social Work Assessment  Patient Details  Name: Julia Gordon MRN: [id number] Date of Birth: [birth year]  Date of referral:  07/20/17               Reason for consult:  Facility Placement                Permission sought to share information with:    Permission granted to share information::     Name::        Agency::     Relationship::     Contact Information:     Housing/Transportation Living arrangements for the past 2 months:  Single Family Home Source of Information:  Patient, Adult Children Patient Interpreter Needed:  None Criminal Activity/Legal Involvement Pertinent to Current Situation/Hospitalization:  No - Comment as needed Significant Relationships:  Adult Children, Spouse Lives with:  Spouse Do you feel safe going back to the place where you live?  Yes Need for family participation in patient care:  Yes (Comment)  Care giving concerns:  Patient lives in [place] with her husband [name].    Facilities manager / plan:  Holiday representative ([place]) reviewed chart and noted that patient has a femur fracture. Ortho consult is pending. CSW met with patient today and her daughter [name] was at bedside. Patient was alert and oriented X4 and was laying in the bed. CSW introduced self and explained role of CSW department. Per patient she lives with her husband in [place]. Per patient she does not work and her husband does not work. Per patient she believes her BCBS is primary however she also has Psychologist, prison and probation services. CSW assistant is looking into which insurance is primary. Per patient she may have surgery tomorrow. CSW explained that after surgery PT will evaluate patient and make a recommendation of home health or SNF. CSW explained that her insurance will have to approve SNF. Per patient she prefers to go home with home health. RN case manager aware of above. CSW will continue to follow and assist as needed.   Employment status:  Retired Programmer, applications PT Recommendations:  Not assessed at this time Information / Referral to community resources:  [place]  Patient/Family's Response to care:  Patient prefers to go home with home health.   Patient/Family's Understanding of and Emotional Response to Diagnosis, Current Treatment, and Prognosis:  Patient was very pleasant and thanked CSW for assistance.   Emotional Assessment Appearance:  Appears stated age Attitude/Demeanor/Rapport:    Affect (typically observed):  Accepting, Adaptable, Pleasant Orientation:  Oriented to Self, Oriented to Place, Oriented to  Time, Oriented to Situation Alcohol / Substance use:  Not Applicable Psych involvement (Current and /or in the community):  No (Comment)  Discharge Needs  Concerns to be addressed:  Discharge Planning Concerns Readmission within the last 30 days:  No Current discharge risk:  Dependent with Mobility Barriers to Discharge:  Continued Medical Work up   [employer], [employer], LCSW 07/20/2017, 10:00 AM

## 2017-07-20 NOTE — ED Notes (Signed)
External catheter placed and patient given a warm blanket at this time.

## 2017-07-20 NOTE — ED Notes (Signed)
Report given to [facility], RN but she has a low bed request into the [facility] for this patient.  RN requests we hold this patient while they get a low bed for her.  Anessa will call me back in appr. 10 minutes.

## 2017-07-20 NOTE — H&P (Signed)
[facility] NAME: Julia Gordon    MR#:  [id number]  DATE OF BIRTH:  [birth year]  DATE OF ADMISSION:  07/19/2017  PRIMARY CARE PHYSICIAN: Ellamae Sia, MD   REQUESTING/REFERRING PHYSICIAN: Owens Shark, MD  CHIEF COMPLAINT:   Chief Complaint  Patient presents with  . Knee Pain    Right Side    HISTORY OF PRESENT ILLNESS:  Julia Gordon  is a 83 y.o. female who presents with right knee pain in her leg.  She was getting on her knees to pray when she felt acute pain about her right knee on that leg.  Imaging here in the ED shows distal femur fracture.  Hospitalist called for admission  PAST MEDICAL HISTORY:   Past Medical History:  Diagnosis Date  . Anemia   . Atrial fibrillation ([place])    history of  . Breast cancer ([place]) 2015   LT MASTECTOMY  . 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 COPD (chronic obstructive pulmonary disease) ([place])   . Depression   . DJD (degenerative joint disease) of knee    right knee  . H/O hysterectomy with oophorectomy    for DUB  . History of depression   . HLD (hyperlipidemia)   . HTN (hypertension)   . LV (left ventricular) mural thrombus    history of, resolved (echo 04/09)  . Other primary cardiomyopathies   . Pacemaker 05/2007   [employer] ICD, [employer] lead, [facility] Dr Isaias Sakai  . Personal history of chemotherapy 2015   BREAST CA     PAST SURGICAL HISTORY:   Past Surgical History:  Procedure Laterality Date  . ABDOMINAL HYSTERECTOMY    . BREAST BIOPSY Left 05/10/13   [place] bx-positive  . COLONOSCOPY WITH PROPOFOL N/A 01/15/2017   Procedure: COLONOSCOPY WITH PROPOFOL;  Surgeon: Jonathon Bellows, MD;  Location: [facility] ENDOSCOPY;  Service: Gastroenterology;  Laterality: N/A;  . LAPAROSCOPIC RIGHT COLON RESECTION  08/10   Dr Pat Patrick, for adenoca   . MASTECTOMY Left 2015   BREAST CA  . OOPHORECTOMY    . PACEMAKER INSERTION  2009  . REPLACEMENT TOTAL KNEE  1990's   right      SOCIAL HISTORY:    Social History   Tobacco Use  . Smoking status: Former Research scientist (life sciences)  . Smokeless tobacco: Never Used  . Tobacco comment: quit 2006  Substance Use Topics  . Alcohol use: No    Comment: former     FAMILY HISTORY:   Family History  Problem Relation Age of Onset  . Hypertension Father   . Alzheimer's disease Mother   . Breast cancer Sister 26  . Diabetes Unknown        brothers  . Stroke Brother   . Heart failure Brother   . Lupus Sister      DRUG ALLERGIES:   Allergies  Allergen Reactions  . No Known Allergies     MEDICATIONS AT HOME:   Prior to Admission medications   Medication Sig Start Date End Date Taking? Authorizing Provider  alendronate (FOSAMAX) 70 MG tablet TAKE 1 TAB WEEKLY 30 MINS BEFORE BREAKFAST WITH A FULL GLASS OF WATER. DO NOT LIE DOWN FOR 30 MINS 06/28/17  Yes Lloyd Huger, MD  aspirin 81 MG tablet Take 81 mg by mouth daily.   Yes [provider]  furosemide (LASIX) 40 MG tablet Take 40 mg by mouth daily.  10/09/14  Yes [provider]  ibuprofen (ADVIL,MOTRIN) 200 MG  tablet Take 200 mg by mouth every 6 (six) hours as needed for mild pain or moderate pain.    Yes [provider]  isosorbide mononitrate (IMDUR) 30 MG 24 hr tablet Take 1 tablet by mouth Daily. 01/31/11  Yes [provider]  KLOR-CON M20 20 MEQ tablet Take 1 tablet by mouth Daily. 03/03/11  Yes [provider]  lisinopril (PRINIVIL,ZESTRIL) 20 MG tablet Take 20 mg by mouth daily.   Yes [provider]  lovastatin (MEVACOR) 40 MG tablet Take 2 tablets by mouth Daily. 01/31/11  Yes [provider]  metoprolol succinate (TOPROL-XL) 50 MG 24 hr tablet Take 50 mg by mouth daily.  07/11/14  Yes [provider]  MULTIPLE VITAMIN PO Take by mouth daily.   Yes [provider]  spironolactone (ALDACTONE) 50 MG tablet Take 50 mg by mouth daily.   Yes [provider]  tamoxifen (NOLVADEX) 20 MG tablet Take 1 tablet  (20 mg total) by mouth daily. Patient not taking: Reported on 07/20/2017 06/24/16   Lloyd Huger, MD    REVIEW OF SYSTEMS:  Review of Systems  Constitutional: Negative for chills, fever, malaise/fatigue and weight loss.  HENT: Negative for ear pain, hearing loss and tinnitus.   Eyes: Negative for blurred vision, double vision, pain and redness.  Respiratory: Negative for cough, hemoptysis and shortness of breath.   Cardiovascular: Negative for chest pain, palpitations, orthopnea and leg swelling.  Gastrointestinal: Negative for abdominal pain, constipation, diarrhea, nausea and vomiting.  Genitourinary: Negative for dysuria, frequency and hematuria.  Musculoskeletal: Positive for joint pain (Right knee region). Negative for back pain and neck pain.  Skin:       No acne, rash, or lesions  Neurological: Negative for dizziness, tremors, focal weakness and weakness.  Endo/Heme/Allergies: Negative for polydipsia. Does not bruise/bleed easily.  Psychiatric/Behavioral: Negative for depression. The patient is not nervous/anxious and does not have insomnia.      VITAL SIGNS:   Vitals:   07/19/17 2205 07/19/17 2206 07/19/17 2207 07/19/17 2345  BP:  (!) 143/91  (!) 144/80  Pulse:  (!) 56  (!) 52  Resp:  18    Temp:  (!) 97.4 F (36.3 C)    TempSrc:  Oral    SpO2: 100% 100%  97%  Weight:   72.6 kg (160 lb)   Height:   5\' 2"  (1.575 m)    Wt Readings from Last 3 Encounters:  07/19/17 72.6 kg (160 lb)  01/22/17 73.2 kg (161 lb 6.4 oz)  01/15/17 72.6 kg (160 lb)    PHYSICAL EXAMINATION:  Physical Exam  Vitals reviewed. Constitutional: She is oriented to person, place, and time. She appears well-developed and well-nourished. No distress.  HENT:  Head: Normocephalic and atraumatic.  Mouth/Throat: Oropharynx is clear and moist.  Eyes: Pupils are equal, round, and reactive to light. Conjunctivae and EOM are normal. No scleral icterus.  Neck: Normal range of motion. Neck supple. No  JVD present. No thyromegaly present.  Cardiovascular: Normal rate, regular rhythm and intact distal pulses. Exam reveals no gallop and no friction rub.  No murmur heard. Respiratory: Effort normal and breath sounds normal. No respiratory distress. She has no wheezes. She has no rales.  GI: Soft. Bowel sounds are normal. She exhibits no distension. There is no tenderness.  Musculoskeletal: Normal range of motion. She exhibits tenderness (Right knee region). She exhibits no edema.  No arthritis, no gout  Lymphadenopathy:    She has no cervical adenopathy.  Neurological: She is alert and oriented to person, place, and time. No cranial nerve deficit.  No dysarthria, no aphasia  Skin: Skin is warm and dry. No rash noted. No erythema.  Psychiatric: She has a normal mood and affect. Her behavior is normal. Judgment and thought content normal.    LABORATORY PANEL:   CBC Recent Labs  Lab 07/19/17 2209  WBC 7.4  HGB 12.3  HCT 34.5*  PLT 159   ------------------------------------------------------------------------------------------------------------------  Chemistries  Recent Labs  Lab 07/19/17 2209  NA 137  K 3.2*  CL 108  CO2 23  GLUCOSE 96  BUN 23  CREATININE 0.96  CALCIUM 7.6*  AST 22  ALT 13  ALKPHOS 23*  BILITOT 0.6   ------------------------------------------------------------------------------------------------------------------  Cardiac Enzymes No results for input(s): TROPONINI in the last 168 hours. ------------------------------------------------------------------------------------------------------------------  RADIOLOGY:  Dg Chest 1 View  Result Date: 07/19/2017 CLINICAL DATA:  RT distal femur pain and swelling after fall tonight; image of lateral knee joint obtained with RT knee exam performed just prior to this exam Hx/o a-fib, breast cancer, LT side mastectomy; COPD, colon adenocarcinoma Former smoker EXAM: CHEST  1 VIEW COMPARISON:  None. FINDINGS: Right  sided Port-A-Cath terminates at the low SVC. Single lead pacer/AICD device. Patient rotated right. Mild cardiomegaly with a tortuous thoracic aorta. No pleural effusion or pneumothorax. Mild right hemidiaphragm elevation. Clear lungs. IMPRESSION: No acute cardiopulmonary disease. Cardiomegaly and aortic tortuosity. Electronically Signed   By: Abigail Miyamoto M.D.   On: 07/19/2017 23:29   Dg Knee 1-2 Views Right  Result Date: 07/19/2017 CLINICAL DATA:  Pain and swelling of the right distal femur. Leg buckled while trying to kneel down today. EXAM: RIGHT KNEE - 1-2 VIEW COMPARISON:  None. FINDINGS: An acute, closed, comminuted fracture of the distal metadiaphysis of the right femur is noted with 1 shaft width lateral displacement of the femoral condyles relative to the femoral shaft. Intact total knee arthroplasty without prosthetic loosening nor hardware failure. The proximal tibia and fibula appear intact. Femoral through tibial arteriosclerosis is noted. No knee joint effusion. IMPRESSION: 1. Acute, closed, comminuted fracture of the distal metadiaphysis of the right femur with 1 shaft width lateral displacement of the femoral condyles relative to the femoral shaft. 2. Intact right total knee arthroplasty without fracture involvement. Electronically Signed   By: Ashley Royalty M.D.   On: 07/19/2017 23:05   Dg Femur Portable Min 2 Views Right  Result Date: 07/19/2017 CLINICAL DATA:  Pain after fall EXAM: RIGHT FEMUR PORTABLE 2 VIEW COMPARISON:  None. FINDINGS: Acute, closed, dorsally angulated and 1/2 shaft width displaced, fracture through the distal femoral metadiaphysis is noted, just proximal to a total knee arthroplasty. There is slight comminution of the fracture. No fracture involvement of the adjacent knee arthroplasty is seen. The right hip joint is maintained and aligned. No suspicious osseous lesions are seen. IMPRESSION: Acute, closed, comminuted metadiaphyseal fracture of the right femur with dorsal  angulation and 1/2 shaft width displacement. Electronically Signed   By: Ashley Royalty M.D.   On: 07/19/2017 23:28    EKG:   Orders placed or performed during the hospital encounter of 07/19/17  . ED EKG  . ED EKG  . EKG 12-Lead  . EKG 12-Lead    IMPRESSION AND PLAN:  Principal Problem:   Femur fracture (HCC) -PRN analgesia, orthopedic surgery consult with likely plan for operative repair.  Cardiac risk stratification as below, patient has 1 risk factor   Active Problems:   Chronic systolic  congestive heart failure ([place]) -continue home meds   HTN (hypertension) -stable, continue home dose antihypertensives   PAF (paroxysmal atrial fibrillation) ([place]) -continue home rate controlling medications, patient is not on anticoagulation   Dyslipidemia -Home dose antilipid  Chart review performed and case discussed with ED provider. Labs, imaging and/or ECG reviewed by provider and discussed with patient/family. Management plans discussed with the patient and/or family.  DVT PROPHYLAXIS: Mechanical only  GI PROPHYLAXIS: None  ADMISSION STATUS: Inpatient  CODE STATUS: Full  TOTAL TIME TAKING CARE OF THIS PATIENT: 45 minutes.   Prateek Knipple Holley 07/20/2017, 1:05 AM  [employer]  [phone]  CC: Primary care physician; Ellamae Sia, MD  Note:  This document was prepared using Dragon voice recognition software and may include unintentional dictation errors.

## 2017-07-20 NOTE — ED Notes (Addendum)
Anessa, RN has low bed and patient is being taken up by Mayra, EDT

## 2017-07-20 NOTE — Progress Notes (Signed)
It was confirmed through registration and Clinical Social Work ([place]) Environmental consultant that [employer] is patient's primary insurance and [employer] is secondary. CSW met with patient and made her aware of above. CSW made patient aware that [employer] requested that she contact them and inform them she has Psychologist, prison and probation services. CSW provided patient with [facility] phone number.   [employer], LCSW [phone]

## 2017-07-20 NOTE — Plan of Care (Signed)
  Problem: Education: Goal: Knowledge of General Education information will improve Outcome: Progressing   Problem: Health Behavior/Discharge Planning: Goal: Ability to manage health-related needs will improve Outcome: Progressing   Problem: Clinical Measurements: Goal: Ability to maintain clinical measurements within normal limits will improve Outcome: Progressing Goal: Will remain free from infection Outcome: Progressing Goal: Diagnostic test results will improve Outcome: Progressing Goal: Respiratory complications will improve Outcome: Progressing Goal: Cardiovascular complication will be avoided Outcome: Progressing   Problem: Activity: Goal: Risk for activity intolerance will decrease Outcome: Progressing   Problem: Nutrition: Goal: Adequate nutrition will be maintained Outcome: Progressing   Problem: Coping: Goal: Level of anxiety will decrease Outcome: Progressing   Problem: Elimination: Goal: Will not experience complications related to bowel motility Outcome: Progressing Goal: Will not experience complications related to urinary retention Outcome: Progressing   Problem: Pain Managment: Goal: General experience of comfort will improve Outcome: Progressing   Problem: Safety: Goal: Ability to remain free from injury will improve Outcome: Progressing   Problem: Skin Integrity: Goal: Risk for impaired skin integrity will decrease Outcome: Progressing   Problem: Education: Goal: Verbalization of understanding the information provided (i.e., activity precautions, restrictions, etc) will improve Outcome: Progressing   Problem: Activity: Goal: Ability to ambulate and perform ADLs will improve Outcome: Progressing   Problem: Clinical Measurements: Goal: Postoperative complications will be avoided or minimized Outcome: Progressing   Problem: Self-Concept: Goal: Ability to maintain and perform role responsibilities to the fullest extent possible will  improve Outcome: Progressing   Problem: Pain Management: Goal: Pain level will decrease Outcome: Progressing   

## 2017-07-20 NOTE — Consult Note (Signed)
Patient with periprosthetic femur fracture. Plan for ORIF tomorrow morning. Orders placed. Detailed note to follow. Please call with questions.

## 2017-07-21 ENCOUNTER — Inpatient Hospital Stay: Payer: Medicare HMO

## 2017-07-21 ENCOUNTER — Encounter
Admission: EM | Disposition: A | Discharge status: Skilled Nursing Facility | Payer: Self-pay | Source: Home / Self Care | Attending: Internal Medicine

## 2017-07-21 ENCOUNTER — Encounter: Payer: Self-pay | Admitting: Certified Registered Nurse Anesthetist

## 2017-07-21 ENCOUNTER — Inpatient Hospital Stay: Payer: Medicare HMO | Admitting: Anesthesiology

## 2017-07-21 HISTORY — PX: ORIF FEMUR FRACTURE: SHX2119

## 2017-07-21 LAB — BASIC METABOLIC PANEL
ANION GAP: 7 (ref 5–15)
BUN: 14 mg/dL (ref 8–23)
CHLORIDE: 102 mmol/L (ref 98–111)
CO2: 30 mmol/L (ref 22–32)
Calcium: 8.9 mg/dL (ref 8.9–10.3)
Creatinine, Ser: 1.05 mg/dL — ABNORMAL HIGH (ref 0.44–1.00)
GFR, EST AFRICAN AMERICAN: 56 mL/min — AB (ref >60)
GFR, EST NON AFRICAN AMERICAN: 48 mL/min — AB (ref >60)
Glucose, Bld: 120 mg/dL — ABNORMAL HIGH (ref 70–99)
POTASSIUM: 4.4 mmol/L (ref 3.5–5.1)
SODIUM: 139 mmol/L (ref 135–145)

## 2017-07-21 LAB — CBC
HCT: 36.1 % (ref 35.0–47.0)
HEMOGLOBIN: 12.5 g/dL (ref 12.0–16.0)
MCH: 33.4 pg (ref 26.0–34.0)
MCHC: 34.5 g/dL (ref 32.0–36.0)
MCV: 96.9 fL (ref 80.0–100.0)
PLATELETS: 164 10*3/uL (ref 150–440)
RBC: 3.73 MIL/uL — AB (ref 3.80–5.20)
RDW: 14.9 % — ABNORMAL HIGH (ref 11.5–14.5)
WBC: 7.2 10*3/uL (ref 3.6–11.0)

## 2017-07-21 SURGERY — OPEN REDUCTION INTERNAL FIXATION (ORIF) DISTAL FEMUR FRACTURE
Anesthesia: Spinal | Laterality: Right | Wound class: "Clean "

## 2017-07-21 MED ORDER — ROPIVACAINE HCL 5 MG/ML IJ SOLN
INTRAMUSCULAR | Status: AC
  Filled 2017-07-21: qty 30

## 2017-07-21 MED ORDER — FENTANYL CITRATE (PF) 100 MCG/2ML IJ SOLN
50.0000 ug | Freq: Once | INTRAMUSCULAR | Status: AC
  Administered 2017-07-21: 50 ug via INTRAVENOUS

## 2017-07-21 MED ORDER — ACETAMINOPHEN 500 MG PO TABS
500.0000 mg | ORAL_TABLET | Freq: Four times a day (QID) | ORAL | Status: DC
  Administered 2017-07-21 – 2017-07-22 (×3): 500 mg via ORAL
  Filled 2017-07-21 (×3): qty 1

## 2017-07-21 MED ORDER — FENTANYL CITRATE (PF) 100 MCG/2ML IJ SOLN: 25.0000 ug | INTRAMUSCULAR | Status: DC | PRN

## 2017-07-21 MED ORDER — GLYCOPYRROLATE 0.2 MG/ML IJ SOLN
INTRAMUSCULAR | Status: DC | PRN
  Administered 2017-07-21: 0.1 mg via INTRAVENOUS

## 2017-07-21 MED ORDER — BACITRACIN 50000 UNITS IM SOLR
INTRAMUSCULAR | Status: DC | PRN
  Administered 2017-07-21: 12:00:00

## 2017-07-21 MED ORDER — FENTANYL CITRATE (PF) 100 MCG/2ML IJ SOLN
INTRAMUSCULAR | Status: DC | PRN
  Administered 2017-07-21 (×2): 25 ug via INTRAVENOUS
  Administered 2017-07-21: 50 ug via INTRAVENOUS

## 2017-07-21 MED ORDER — MIDAZOLAM HCL 2 MG/2ML IJ SOLN
1.0000 mg | Freq: Once | INTRAMUSCULAR | Status: AC
Start: 2017-07-21 — End: 2017-07-21
  Administered 2017-07-21: 1 mg via INTRAVENOUS

## 2017-07-21 MED ORDER — SODIUM CHLORIDE 0.9 % IV SOLN
INTRAVENOUS | Status: DC
  Administered 2017-07-21 (×2): via INTRAVENOUS

## 2017-07-21 MED ORDER — LACTATED RINGERS IV SOLN: INTRAVENOUS | Status: DC

## 2017-07-21 MED ORDER — SODIUM CHLORIDE 0.9 % IV SOLN
INTRAVENOUS | Status: DC | PRN
  Administered 2017-07-21: 25 ug/min via INTRAVENOUS

## 2017-07-21 MED ORDER — EPINEPHRINE PF 1 MG/ML IJ SOLN
INTRAMUSCULAR | Status: AC
  Filled 2017-07-21: qty 1

## 2017-07-21 MED ORDER — GLYCOPYRROLATE 0.2 MG/ML IJ SOLN
INTRAMUSCULAR | Status: AC
Start: 2017-07-21 — End: ?
  Filled 2017-07-21: qty 1

## 2017-07-21 MED ORDER — BUPIVACAINE-EPINEPHRINE (PF) 0.25% -1:200000 IJ SOLN
INTRAMUSCULAR | Status: AC
  Filled 2017-07-21: qty 30

## 2017-07-21 MED ORDER — EPHEDRINE SULFATE 50 MG/ML IJ SOLN
INTRAMUSCULAR | Status: AC
  Filled 2017-07-21: qty 1

## 2017-07-21 MED ORDER — EPHEDRINE SULFATE 50 MG/ML IJ SOLN
INTRAMUSCULAR | Status: DC | PRN
  Administered 2017-07-21 (×3): 10 mg via INTRAVENOUS

## 2017-07-21 MED ORDER — MAGNESIUM CITRATE PO SOLN
1.0000 | Freq: Once | ORAL | Status: AC | PRN
  Administered 2017-07-23: 1 via ORAL
  Filled 2017-07-21: qty 296

## 2017-07-21 MED ORDER — PHENYLEPHRINE HCL 10 MG/ML IJ SOLN
INTRAMUSCULAR | Status: DC | PRN
  Administered 2017-07-21: 200 ug via INTRAVENOUS
  Administered 2017-07-21: 100 ug via INTRAVENOUS
  Administered 2017-07-21: 200 ug via INTRAVENOUS

## 2017-07-21 MED ORDER — FENTANYL CITRATE (PF) 250 MCG/5ML IJ SOLN
INTRAMUSCULAR | Status: AC
  Filled 2017-07-21: qty 5

## 2017-07-21 MED ORDER — PROPOFOL 10 MG/ML IV BOLUS
INTRAVENOUS | Status: DC | PRN
  Administered 2017-07-21: 5 mg via INTRAVENOUS

## 2017-07-21 MED ORDER — MIDAZOLAM HCL 2 MG/2ML IJ SOLN
INTRAMUSCULAR | Status: AC
  Administered 2017-07-21: 1 mg via INTRAVENOUS
  Filled 2017-07-21: qty 2

## 2017-07-21 MED ORDER — MORPHINE SULFATE (PF) 2 MG/ML IV SOLN: 0.5000 mg | INTRAVENOUS | Status: DC | PRN

## 2017-07-21 MED ORDER — PROPOFOL 500 MG/50ML IV EMUL
INTRAVENOUS | Status: DC | PRN
  Administered 2017-07-21: 50 ug/kg/min via INTRAVENOUS

## 2017-07-21 MED ORDER — MORPHINE SULFATE (PF) 2 MG/ML IV SOLN: 2.0000 mg | INTRAVENOUS | Status: DC | PRN

## 2017-07-21 MED ORDER — LIDOCAINE HCL (PF) 1 % IJ SOLN
INTRAMUSCULAR | Status: AC
  Filled 2017-07-21: qty 5

## 2017-07-21 MED ORDER — BUPIVACAINE-EPINEPHRINE (PF) 0.25% -1:200000 IJ SOLN
INTRAMUSCULAR | Status: DC | PRN
  Administered 2017-07-21: 30 mL

## 2017-07-21 MED ORDER — BISACODYL 10 MG RE SUPP
10.0000 mg | Freq: Every day | RECTAL | Status: DC | PRN
  Administered 2017-07-23: 10 mg via RECTAL
  Filled 2017-07-21: qty 1

## 2017-07-21 MED ORDER — ACETAMINOPHEN 10 MG/ML IV SOLN
INTRAVENOUS | Status: AC
  Filled 2017-07-21: qty 100

## 2017-07-21 MED ORDER — DOCUSATE SODIUM 100 MG PO CAPS
100.0000 mg | ORAL_CAPSULE | Freq: Two times a day (BID) | ORAL | Status: DC
  Administered 2017-07-21 – 2017-07-23 (×4): 100 mg via ORAL
  Filled 2017-07-21 (×4): qty 1

## 2017-07-21 MED ORDER — CEFAZOLIN SODIUM-DEXTROSE 1-4 GM/50ML-% IV SOLN
1.0000 g | Freq: Four times a day (QID) | INTRAVENOUS | Status: AC
  Administered 2017-07-21 – 2017-07-22 (×3): 1 g via INTRAVENOUS
  Filled 2017-07-21 (×3): qty 50

## 2017-07-21 MED ORDER — SODIUM CHLORIDE FLUSH 0.9 % IV SOLN
INTRAVENOUS | Status: AC
  Filled 2017-07-21: qty 10

## 2017-07-21 MED ORDER — GABAPENTIN 300 MG PO CAPS
300.0000 mg | ORAL_CAPSULE | Freq: Three times a day (TID) | ORAL | Status: DC
  Administered 2017-07-21 – 2017-07-22 (×4): 300 mg via ORAL
  Filled 2017-07-21 (×5): qty 1

## 2017-07-21 MED ORDER — ONDANSETRON HCL 4 MG/2ML IJ SOLN: 4.0000 mg | Freq: Once | INTRAMUSCULAR | Status: DC | PRN

## 2017-07-21 MED ORDER — ACETAMINOPHEN 10 MG/ML IV SOLN
INTRAVENOUS | Status: DC | PRN
  Administered 2017-07-21: 1000 mg via INTRAVENOUS

## 2017-07-21 MED ORDER — ENOXAPARIN SODIUM 40 MG/0.4ML ~~LOC~~ SOLN
40.0000 mg | SUBCUTANEOUS | Status: DC
  Administered 2017-07-22 – 2017-07-23 (×2): 40 mg via SUBCUTANEOUS
  Filled 2017-07-21 (×2): qty 0.4

## 2017-07-21 MED ORDER — FENTANYL CITRATE (PF) 100 MCG/2ML IJ SOLN
INTRAMUSCULAR | Status: AC
  Administered 2017-07-21: 50 ug via INTRAVENOUS
  Filled 2017-07-21: qty 2

## 2017-07-21 MED ORDER — KETOROLAC TROMETHAMINE 15 MG/ML IJ SOLN
7.5000 mg | Freq: Four times a day (QID) | INTRAMUSCULAR | Status: DC
  Administered 2017-07-21 – 2017-07-22 (×3): 7.5 mg via INTRAVENOUS
  Filled 2017-07-21 (×3): qty 1

## 2017-07-21 MED ORDER — ONDANSETRON HCL 4 MG/2ML IJ SOLN
INTRAMUSCULAR | Status: AC
  Filled 2017-07-21: qty 2

## 2017-07-21 MED ORDER — PHENYLEPHRINE HCL 10 MG/ML IJ SOLN
INTRAMUSCULAR | Status: AC
  Filled 2017-07-21: qty 1

## 2017-07-21 MED ORDER — BUPIVACAINE HCL (PF) 0.5 % IJ SOLN
INTRAMUSCULAR | Status: DC | PRN
  Administered 2017-07-21: 3 mL

## 2017-07-21 MED ORDER — HYDROCODONE-ACETAMINOPHEN 5-325 MG PO TABS: 1.0000 | ORAL_TABLET | ORAL | Status: DC | PRN

## 2017-07-21 MED ORDER — BACITRACIN 50000 UNITS IM SOLR
INTRAMUSCULAR | Status: AC
  Filled 2017-07-21: qty 1

## 2017-07-21 MED ORDER — METOCLOPRAMIDE HCL 10 MG PO TABS: 5.0000 mg | ORAL_TABLET | Freq: Three times a day (TID) | ORAL | Status: DC | PRN

## 2017-07-21 MED ORDER — PROPOFOL 10 MG/ML IV BOLUS
INTRAVENOUS | Status: AC
  Filled 2017-07-21: qty 20

## 2017-07-21 MED ORDER — METOCLOPRAMIDE HCL 5 MG/ML IJ SOLN: 5.0000 mg | Freq: Three times a day (TID) | INTRAMUSCULAR | Status: DC | PRN

## 2017-07-21 MED ORDER — TRAMADOL HCL 50 MG PO TABS
50.0000 mg | ORAL_TABLET | Freq: Four times a day (QID) | ORAL | Status: DC
  Administered 2017-07-21 – 2017-07-22 (×3): 50 mg via ORAL
  Filled 2017-07-21 (×3): qty 1

## 2017-07-21 MED ORDER — ONDANSETRON HCL 4 MG/2ML IJ SOLN
INTRAMUSCULAR | Status: DC | PRN
  Administered 2017-07-21: 4 mg via INTRAVENOUS

## 2017-07-21 MED ORDER — POLYETHYLENE GLYCOL 3350 17 G PO PACK
17.0000 g | PACK | Freq: Every day | ORAL | Status: DC | PRN
  Administered 2017-07-22: 17 g via ORAL
  Filled 2017-07-21: qty 1

## 2017-07-21 SURGICAL SUPPLY — 65 items
BAG COUNTER SPONGE EZ (MISCELLANEOUS) ×1 IMPLANT
BANDAGE ELASTIC 4 LF NS (GAUZE/BANDAGES/DRESSINGS) IMPLANT
BIT DRILL CALIBRATED 3.2MM (DRILL) IMPLANT
BIT DRILL CANN QC 4.3X180 (BIT) IMPLANT
BIT DRILL GUIDEWIRE 2.5X200 (WIRE) ×2 IMPLANT
BIT DRILL Q/COUPLING 1 (BIT) ×2 IMPLANT
CANISTER SUCT 1200ML W/VALVE (MISCELLANEOUS) ×6 IMPLANT
CHLORAPREP W/TINT 26ML (MISCELLANEOUS) ×5 IMPLANT
COUNTER SPONGE BAG EZ (MISCELLANEOUS)
DRAPE C-ARM XRAY 36X54 (DRAPES) ×3 IMPLANT
DRAPE EXTREMITY 106X87X128.5 (DRAPES) ×2 IMPLANT
DRAPE SHEET LG 3/4 BI-LAMINATE (DRAPES) ×4 IMPLANT
DRESSING ALLEVYN LIFE SACRUM (GAUZE/BANDAGES/DRESSINGS) ×2 IMPLANT
DRILL BIT 4.3MM (BIT) ×3
DRILL CALIBRATED 3.2MM (DRILL) ×3
DRSG AQUACEL AG ADV 3.5X10 (GAUZE/BANDAGES/DRESSINGS) ×1 IMPLANT
ELECT REM PT RETURN 9FT ADLT (ELECTROSURGICAL) ×3
ELECTRODE REM PT RTRN 9FT ADLT (ELECTROSURGICAL) ×1 IMPLANT
GAUZE PETRO XEROFOAM 1X8 (MISCELLANEOUS) ×3 IMPLANT
GAUZE SPONGE 4X4 12PLY STRL (GAUZE/BANDAGES/DRESSINGS) ×3 IMPLANT
GLOVE BIO SURGEON STRL SZ8 (GLOVE) ×3 IMPLANT
GLOVE SURG ORTHO 8.5 STRL (GLOVE) ×3 IMPLANT
GLOVE SURG XRAY 8.5 LX (GLOVE) ×3 IMPLANT
GOWN STRL REUS W/ TWL LRG LVL3 (GOWN DISPOSABLE) ×1 IMPLANT
GOWN STRL REUS W/TWL LRG LVL3 (GOWN DISPOSABLE) ×4
GOWN STRL REUS W/TWL LRG LVL4 (GOWN DISPOSABLE) ×3 IMPLANT
HEMOVAC 400CC 10FR (MISCELLANEOUS) ×3 IMPLANT
HOLDER FOLEY CATH W/STRAP (MISCELLANEOUS) ×2 IMPLANT
KIT TURNOVER KIT A (KITS) ×3 IMPLANT
MAT BLUE FLOOR 46X72 FLO (MISCELLANEOUS) ×3 IMPLANT
NDL FILTER BLUNT 18X1 1/2 (NEEDLE) ×1 IMPLANT
NDL SPNL 18GX3.5 QUINCKE PK (NEEDLE) ×1 IMPLANT
NEEDLE FILTER BLUNT 18X 1/2SAF (NEEDLE) ×2
NEEDLE FILTER BLUNT 18X1 1/2 (NEEDLE) ×1 IMPLANT
NEEDLE SPNL 18GX3.5 QUINCKE PK (NEEDLE) ×3 IMPLANT
NS IRRIG 1000ML POUR BTL (IV SOLUTION) ×6 IMPLANT
PACK HIP PROSTHESIS (MISCELLANEOUS) ×3 IMPLANT
PAD CAST CTTN 4X4 STRL (SOFTGOODS) IMPLANT
PADDING CAST COTTON 4X4 STRL (SOFTGOODS) ×2
PLATE CONDYLAR CVD 8H RT (Plate) ×2 IMPLANT
SCREW CANN 5.0  VARIAB (Screw) ×2 IMPLANT
SCREW CANN 5.0 VARIAB (Screw) IMPLANT
SCREW CANN LOCK VA 5.0X30 (Screw) ×2 IMPLANT
SCREW CORTEX ST 4.5X38 (Screw) ×2 IMPLANT
SCREW CORTEX ST 4.5X44 (Screw) ×2 IMPLANT
SCREW LOCKING VA 5.0X70MM (Screw) ×4 IMPLANT
SCREW VA LOCKING 5.0X55 (Screw) ×2 IMPLANT
SCREW VA LOCKING 5.0X65 (Screw) ×4 IMPLANT
SOL PREP PVP 2OZ (MISCELLANEOUS)
SOLUTION PREP PVP 2OZ (MISCELLANEOUS) ×1 IMPLANT
SPONGE LAP 18X18 RF (DISPOSABLE) ×6 IMPLANT
STAPLER SKIN PROX 35W (STAPLE) ×3 IMPLANT
STOCKINETTE BIAS CUT 6 980064 (GAUZE/BANDAGES/DRESSINGS) ×5 IMPLANT
SUT DVC 2 QUILL PDO  T11 36X36 (SUTURE) ×2
SUT DVC 2 QUILL PDO T11 36X36 (SUTURE) IMPLANT
SUT QUILL 0 20X36 (SUTURE) ×3 IMPLANT
SUT STEEL 7 (SUTURE) ×3 IMPLANT
SUT VIC AB 1 CT1 18XCR BRD 8 (SUTURE) IMPLANT
SUT VIC AB 1 CT1 8-18 (SUTURE) ×2
SUT VIC AB 2-0 CT1 18 (SUTURE) ×2 IMPLANT
SUT VIC AB 2-0 CT1 27 (SUTURE) ×4
SUT VIC AB 2-0 CT1 TAPERPNT 27 (SUTURE) ×2 IMPLANT
SYR 30ML LL (SYRINGE) ×3 IMPLANT
SYR 5ML LL (SYRINGE) ×3 IMPLANT
TRAY FOLEY SLVR 16FR LF STAT (SET/KITS/TRAYS/PACK) ×2 IMPLANT

## 2017-07-21 NOTE — Progress Notes (Signed)
Pts spinal sensation moving down, still no movement, Dr Rosey Bath aware and ok to move pt to room

## 2017-07-21 NOTE — Op Note (Signed)
07/21/2017  2:29 PM  PATIENT:  [name] DIAGNOSIS:  Right distal femur peri-prosthetic fracture  POST-OPERATIVE DIAGNOSIS:  Same  PROCEDURE:  OPEN REDUCTION INTERNAL FIXATION (ORIF) DISTAL FEMUR FRACTURE, RIGHT  SURGEON:  Lovell Sheehan, MD  ASSIST: Carlynn Spry, PA-C  ANESTHESIA: spinal and femoral nerve block  PREOPERATIVE INDICATIONS:  Julia Gordon is a  83 y.o. female with a diagnosis of fractured distal femur who elected for surgical management after extensive discussion of  the risks benefits and alternatives  with the patient preoperatively including but not limited to the risks of infection, bleeding, nerve injury, cardiopulmonary complications, the need for revision surgery, among others, and the patient was willing to proceed.  EBL: 50 cc  OPERATIVE IMPLANTS: Synthes variable angle distal femur locking plate  OPERATIVE FINDINGS: Severe comminution and medial displacement of the proximal fragment. The total knee did not appear loose.  OPERATIVE PROCEDURE: The patient was brought to the operating room and underwent satisfactory anesthesia and was placed in the supine position on the fracture table.  The operative leg was prepped and draped in sterile fashion and the soft tissues infiltrated with half percent Sensorcaine with epinephrine.  A longitudinal lateral incision was made over the distal third of the femur with dissection carried out sharply through fascia down to bone.  The vastus lateralis was lifted anteriorly to expose the distal shaft.  The soft tissues were debrided off the lateral condyle, allowing elevation of the patella.  Irrigation was used. The fracture fragments were manipulated into the best possible position due to the comminution.  A Synthes variable angle 8 hole periprosthetic distal femur plate was aligned along the femur.  It was temporarily fixated with K wires distally and proximally.  Wires were used to find the proximal holes.   Locking screws were then placed through the distal portion of the plate.  The femur was reduced to the best of my ability and deep proximal portion of the femur was filled with cortical and locking screws.  Fluoroscopy showed overall good alignment on AP and lateral views with minimal recurvatum.  Fluoroscopy showed good alignment on AP lateral views. Wound was again irrigated and closed with #2 Quill on fascia, 2-0 Vicryl on subcutaneous tissue and staples on all skin areas.  Aquacel was applied.  Sponge and needle count was correct.  Soft long-leg dressing and knee immobilizer were applied.  The patient was awakened and taken to recovery in good condition.   Kurtis Bushman, MD

## 2017-07-21 NOTE — Transfer of Care (Signed)
Immediate Anesthesia Transfer of Care Note  Patient: Julia Gordon  Procedure(s) Performed: OPEN REDUCTION INTERNAL FIXATION (ORIF) DISTAL FEMUR FRACTURE (Right )  Patient Location: PACU  Anesthesia Type:Spinal  Level of Consciousness: awake, alert , oriented and patient cooperative  Airway & Oxygen Therapy: Patient Spontanous Breathing and Patient connected to nasal cannula oxygen  Post-op Assessment: Report given to RN, Post -op Vital signs reviewed and stable and Patient moving all extremities  Post vital signs: Reviewed and stable  Last Vitals:  Vitals Value Taken Time  BP 128/99 07/21/2017  2:16 PM  Temp    Pulse 97 07/21/2017  2:20 PM  Resp 22 07/21/2017  2:20 PM  SpO2 99 % 07/21/2017  2:20 PM  Vitals shown include unvalidated device data.  Last Pain:  Vitals:   07/21/17 1041  TempSrc:   PainSc: 0-No pain      Patients Stated Pain Goal: 1 (63/01/60 1093)  Complications: No apparent anesthesia complications

## 2017-07-21 NOTE — Progress Notes (Signed)
[place] [facility] [place] NAME: Julia Gordon    MR#:  [id number]  DATE OF BIRTH:  1934/09/18  SUBJECTIVE:  CHIEF COMPLAINT:   Chief Complaint  Patient presents with  . Knee Pain    Right Side   -Right leg in a brace.  Complains of significant pain in the foot as well -Going to the OR today  REVIEW OF SYSTEMS:  Review of Systems  Constitutional: Negative for chills, fever and malaise/fatigue.  HENT: Negative for congestion, ear discharge, hearing loss and nosebleeds.   Eyes: Negative for blurred vision and double vision.  Respiratory: Negative for cough, shortness of breath and wheezing.   Cardiovascular: Negative for chest pain, palpitations and leg swelling.  Gastrointestinal: Negative for abdominal pain, constipation, diarrhea, nausea and vomiting.  Genitourinary: Negative for dysuria.  Musculoskeletal: Positive for joint pain and myalgias.  Neurological: Negative for dizziness, seizures, weakness and headaches.  Psychiatric/Behavioral: Negative for depression.    DRUG ALLERGIES:   Allergies  Allergen Reactions  . No Known Allergies     VITALS:  Blood pressure (!) 149/76, pulse (!) 58, temperature 97.9 F (36.6 C), temperature source Oral, resp. rate 18, height 5\' 2"  (1.575 m), weight 72.6 kg (160 lb), SpO2 98 %.  PHYSICAL EXAMINATION:  Physical Exam  GENERAL:  83 y.o.-year-old patient lying in the bed with no acute distress.  EYES: Pupils equal, round, reactive to light and accommodation. No scleral icterus. Extraocular muscles intact.  HEENT: Head atraumatic, normocephalic. Oropharynx and nasopharynx clear.  NECK:  Supple, no jugular venous distention. No thyroid enlargement, no tenderness.  LUNGS: Normal breath sounds bilaterally, no wheezing, rales,rhonchi or crepitation. No use of accessory muscles of respiration. Decreased bibasilar breath sounds CARDIOVASCULAR: S1, S2 normal. No  rubs, or gallops. 2/6 systolic  murmur ABDOMEN: Soft, nontender, nondistended. Bowel sounds present. No organomegaly or mass.  EXTREMITIES: right leg in brace- extended. No pedal edema, cyanosis, or clubbing.  NEUROLOGIC: Cranial nerves II through XII are intact. Muscle strength 5/5 in all extremities. Sensation intact. Gait not checked.  PSYCHIATRIC: The patient is alert and oriented x 3.  SKIN: No obvious rash, lesion, or ulcer.    LABORATORY PANEL:   CBC Recent Labs  Lab 07/21/17 0321  WBC 7.2  HGB 12.5  HCT 36.1  PLT 164   ------------------------------------------------------------------------------------------------------------------  Chemistries  Recent Labs  Lab 07/19/17 2209  07/21/17 0321  NA 137   < > 139  K 3.2*   < > 4.4  CL 108   < > 102  CO2 23   < > 30  GLUCOSE 96   < > 120*  BUN 23   < > 14  CREATININE 0.96   < > 1.05*  CALCIUM 7.6*   < > 8.9  AST 22  --   --   ALT 13  --   --   ALKPHOS 23*  --   --   BILITOT 0.6  --   --    < > = values in this interval not displayed.   ------------------------------------------------------------------------------------------------------------------  Cardiac Enzymes No results for input(s): TROPONINI in the last 168 hours. ------------------------------------------------------------------------------------------------------------------  RADIOLOGY:  Dg Chest 1 View  Result Date: 07/19/2017 CLINICAL DATA:  RT distal femur pain and swelling after fall tonight; image of lateral knee joint obtained with RT knee exam performed just prior to this exam Hx/o a-fib, breast cancer, LT side mastectomy; COPD, colon adenocarcinoma Former smoker EXAM: CHEST  1 VIEW  COMPARISON:  None. FINDINGS: Right sided Port-A-Cath terminates at the low SVC. Single lead pacer/AICD device. Patient rotated right. Mild cardiomegaly with a tortuous thoracic aorta. No pleural effusion or pneumothorax. Mild right hemidiaphragm elevation. Clear lungs. IMPRESSION: No acute  cardiopulmonary disease. Cardiomegaly and aortic tortuosity. Electronically Signed   By: Abigail Miyamoto M.D.   On: 07/19/2017 23:29   Dg Knee 1-2 Views Right  Result Date: 07/19/2017 CLINICAL DATA:  Pain and swelling of the right distal femur. Leg buckled while trying to kneel down today. EXAM: RIGHT KNEE - 1-2 VIEW COMPARISON:  None. FINDINGS: An acute, closed, comminuted fracture of the distal metadiaphysis of the right femur is noted with 1 shaft width lateral displacement of the femoral condyles relative to the femoral shaft. Intact total knee arthroplasty without prosthetic loosening nor hardware failure. The proximal tibia and fibula appear intact. Femoral through tibial arteriosclerosis is noted. No knee joint effusion. IMPRESSION: 1. Acute, closed, comminuted fracture of the distal metadiaphysis of the right femur with 1 shaft width lateral displacement of the femoral condyles relative to the femoral shaft. 2. Intact right total knee arthroplasty without fracture involvement. Electronically Signed   By: Ashley Royalty M.D.   On: 07/19/2017 23:05   Dg Femur Portable Min 2 Views Right  Result Date: 07/19/2017 CLINICAL DATA:  Pain after fall EXAM: RIGHT FEMUR PORTABLE 2 VIEW COMPARISON:  None. FINDINGS: Acute, closed, dorsally angulated and 1/2 shaft width displaced, fracture through the distal femoral metadiaphysis is noted, just proximal to a total knee arthroplasty. There is slight comminution of the fracture. No fracture involvement of the adjacent knee arthroplasty is seen. The right hip joint is maintained and aligned. No suspicious osseous lesions are seen. IMPRESSION: Acute, closed, comminuted metadiaphyseal fracture of the right femur with dorsal angulation and 1/2 shaft width displacement. Electronically Signed   By: Ashley Royalty M.D.   On: 07/19/2017 23:28    EKG:   Orders placed or performed during the hospital encounter of 07/19/17  . ED EKG  . ED EKG  . EKG 12-Lead  . EKG 12-Lead     ASSESSMENT AND PLAN:   83 year old female with past medical history significant for history of breast cancer status post mastectomy on the left, COPD, hypertension, atrial fibrillation not on anticoagulation presents to hospital secondary to distal right femoral fracture.  1.  Right periprosthetic distal femur fracture-orthopedics consulted -For OR today -Continue pain control, DVT prophylaxis will be started after surgery and physical therapy after surgery as well -Acceptable risk for surgery-has history of   A. fib, status post peacemaker.  Several years ago-no active cardiac symptoms at this time.  2.  Hypertension-continue Imdur, metoprolol, lisinopril  3.  Stage III adenocarcinoma of left breast-status post left mastectomy finished chemo.   -Continue to follow-up with oncology as prior scheduled  4.  Osteoporosis-continue Fosamax and calcium vitamin D supplements.  5. DVT Prophylaxis- TEDs and SCDs   All the records are reviewed and case discussed with Care Management/Social Workerr. Management plans discussed with the patient, family and they are in agreement.  CODE STATUS: Full Code  TOTAL TIME TAKING CARE OF THIS PATIENT: 37 minutes.   POSSIBLE D/C IN 2-3 DAYS, DEPENDING ON CLINICAL CONDITION.   Gladstone Lighter M.D on 07/21/2017 at 8:54 AM  Between 7am to 6pm - Pager - [phone]  After 6pm go to [email or web address] - password [email or web address] [facility]  Office  [phone]  CC: Primary care physician; Ellamae Sia, MD

## 2017-07-21 NOTE — Progress Notes (Signed)
SPoke with DR Justice Rocher and states pts pacemaker is ok , no interegation  needed

## 2017-07-21 NOTE — Anesthesia Procedure Notes (Signed)
Spinal  Patient location during procedure: OR Start time: 07/21/2017 11:40 AM End time: 07/21/2017 11:44 AM Staffing Resident/CRNA: Doreen Salvage, CRNA Preanesthetic Checklist Completed: patient identified, site marked, surgical consent, pre-op evaluation, timeout performed, IV checked, risks and benefits discussed and monitors and equipment checked Spinal Block Patient position: left lateral decubitus Patient monitoring: cardiac monitor, continuous pulse ox and blood pressure Location: L3-4 Injection technique: single-shot Needle Needle type: Pencan  Needle gauge: 22 G Needle length: 9 cm Assessment Sensory level: T10

## 2017-07-21 NOTE — H&P (Signed)
The patient has been re-examined, and the chart reviewed, and there have been no interval changes to the documented history and physical.  Plan a right distal femur open reduction and internal fixation today. ° °Anesthesia is consulted regarding a peripheral nerve block for post-operative pain. ° °The risks, benefits, and alternatives have been discussed at length, and the patient is willing to proceed.   ° °

## 2017-07-21 NOTE — Anesthesia Post-op Follow-up Note (Signed)
Anesthesia QCDR form completed.        

## 2017-07-22 LAB — CBC
HCT: 32.5 % — ABNORMAL LOW (ref 35.0–47.0)
Hemoglobin: 10.9 g/dL — ABNORMAL LOW (ref 12.0–16.0)
MCH: 34 pg (ref 26.0–34.0)
MCHC: 33.6 g/dL (ref 32.0–36.0)
MCV: 101.2 fL — ABNORMAL HIGH (ref 80.0–100.0)
PLATELETS: 139 10*3/uL — AB (ref 150–440)
RBC: 3.21 MIL/uL — AB (ref 3.80–5.20)
RDW: 15.4 % — AB (ref 11.5–14.5)
WBC: 8 10*3/uL (ref 3.6–11.0)

## 2017-07-22 LAB — BASIC METABOLIC PANEL
Anion gap: 7 (ref 5–15)
BUN: 12 mg/dL (ref 8–23)
CALCIUM: 7.7 mg/dL — AB (ref 8.9–10.3)
CO2: 22 mmol/L (ref 22–32)
Chloride: 102 mmol/L (ref 98–111)
Creatinine, Ser: 0.98 mg/dL (ref 0.44–1.00)
GFR calc Af Amer: >60 mL/min (ref >60)
GFR, EST NON AFRICAN AMERICAN: 53 mL/min — AB (ref >60)
Glucose, Bld: 112 mg/dL — ABNORMAL HIGH (ref 70–99)
Potassium: 3.7 mmol/L (ref 3.5–5.1)
SODIUM: 131 mmol/L — AB (ref 135–145)

## 2017-07-22 MED ORDER — TRAMADOL HCL 50 MG PO TABS
50.0000 mg | ORAL_TABLET | Freq: Four times a day (QID) | ORAL | Status: DC
  Administered 2017-07-22 – 2017-07-23 (×2): 50 mg via ORAL
  Filled 2017-07-22 (×3): qty 1

## 2017-07-22 NOTE — Progress Notes (Signed)
Clinical Education officer, museum (CSW) presented bed offers to patient and she chose [facility]. CSW will start [facility] SNF authorization through [facility] once PT note is available. [facility] admissions coordinator at [facility] is aware of above.   [employer], LCSW [phone]

## 2017-07-22 NOTE — Progress Notes (Signed)
[place] at [place] NAME: Julia Gordon    MR#:  [id number]  DATE OF BIRTH:  20-Jan-1934  SUBJECTIVE:   Complains of pain surgical leg POD #1 -family in the room  REVIEW OF SYSTEMS:  Review of Systems  Constitutional: Negative for chills, fever and malaise/fatigue.  HENT: Negative for congestion, ear discharge, hearing loss and nosebleeds.   Eyes: Negative for blurred vision and double vision.  Respiratory: Negative for cough, shortness of breath and wheezing.   Cardiovascular: Negative for chest pain, palpitations and leg swelling.  Gastrointestinal: Negative for abdominal pain, constipation, diarrhea, nausea and vomiting.  Genitourinary: Negative for dysuria.  Musculoskeletal: Positive for joint pain and myalgias.  Neurological: Negative for dizziness, seizures, weakness and headaches.  Psychiatric/Behavioral: Negative for depression.    DRUG ALLERGIES:   Allergies  Allergen Reactions  . No Known Allergies     VITALS:  Blood pressure (!) 112/55, pulse 60, temperature 98 F (36.7 C), temperature source Oral, resp. rate 18, height 5\' 2"  (1.575 m), weight 72.6 kg (160 lb), SpO2 99 %.  PHYSICAL EXAMINATION:  Physical Exam  GENERAL:  83 y.o.-year-old patient lying in the bed with no acute distress.  EYES: Pupils equal, round, reactive to light and accommodation. No scleral icterus. Extraocular muscles intact.  HEENT: Head atraumatic, normocephalic. Oropharynx and nasopharynx clear.  NECK:  Supple, no jugular venous distention. No thyroid enlargement, no tenderness.  LUNGS: Normal breath sounds bilaterally, no wheezing, rales,rhonchi or crepitation. No use of accessory muscles of respiration. Decreased bibasilar breath sounds CARDIOVASCULAR: S1, S2 normal. No  rubs, or gallops. 2/6 systolic murmur ABDOMEN: Soft, nontender, nondistended. Bowel sounds present. No organomegaly or mass.  EXTREMITIES: right leg surgical incision ok. No  pedal edema, cyanosis, or clubbing.  NEUROLOGIC: Cranial nerves II through XII are intact. Muscle strength 5/5 in all extremities. Sensation intact. Gait not checked.  PSYCHIATRIC: The patient is alert and oriented x 3.  SKIN: No obvious rash, lesion, or ulcer.    LABORATORY PANEL:   CBC Recent Labs  Lab 07/22/17 0350  WBC 8.0  HGB 10.9*  HCT 32.5*  PLT 139*   ------------------------------------------------------------------------------------------------------------------  Chemistries  Recent Labs  Lab 07/19/17 2209  07/22/17 0350  NA 137   < > 131*  K 3.2*   < > 3.7  CL 108   < > 102  CO2 23   < > 22  GLUCOSE 96   < > 112*  BUN 23   < > 12  CREATININE 0.96   < > 0.98  CALCIUM 7.6*   < > 7.7*  AST 22  --   --   ALT 13  --   --   ALKPHOS 23*  --   --   BILITOT 0.6  --   --    < > = values in this interval not displayed.   ------------------------------------------------------------------------------------------------------------------  Cardiac Enzymes No results for input(s): TROPONINI in the last 168 hours. ------------------------------------------------------------------------------------------------------------------  RADIOLOGY:  Dg C-arm 1-60 Min  Result Date: 07/21/2017 CLINICAL DATA:  83 year old female with right femur fracture. Subsequent encounter. EXAM: DG C-ARM 61-120 MIN; RIGHT FEMUR 2 VIEWS Fluoroscopic time 53 seconds. COMPARISON:  07/19/2017 plain film exam. FINDINGS: Nine intraoperative C-arm views submitted for review after surgery. Sideplate and screws utilized to transfix distal right femur fracture (just above knee prosthesis). Much better alignment of fracture fragments. IMPRESSION: Open reduction and internal fixation of right femur fracture. Electronically Signed   By:  Genia Del M.D.   On: 07/21/2017 13:53   Dg Femur, Min 2 Views Right  Result Date: 07/21/2017 CLINICAL DATA:  83 year old female with right femur fracture. Subsequent  encounter. EXAM: DG C-ARM 61-120 MIN; RIGHT FEMUR 2 VIEWS Fluoroscopic time 53 seconds. COMPARISON:  07/19/2017 plain film exam. FINDINGS: Nine intraoperative C-arm views submitted for review after surgery. Sideplate and screws utilized to transfix distal right femur fracture (just above knee prosthesis). Much better alignment of fracture fragments. IMPRESSION: Open reduction and internal fixation of right femur fracture. Electronically Signed   By: Genia Del M.D.   On: 07/21/2017 13:53    EKG:   Orders placed or performed during the hospital encounter of 07/19/17  . ED EKG  . ED EKG  . EKG 12-Lead  . EKG 12-Lead    ASSESSMENT AND PLAN:   83 year old female with past medical history significant for history of breast cancer status post mastectomy on the left, COPD, hypertension, atrial fibrillation not on anticoagulation presents to hospital secondary to distal right femoral fracture.  1.  Right periprosthetic distal femur fracture-orthopedics consult with dr Harlow Mares noted -POD #1OPEN REDUCTION INTERNAL FIXATION (ORIF) DISTAL FEMUR FRACTURE, RIGHT -Continue pain control, physical therapy yto see today -Acceptable risk for surgery-has history of   A. fib, status post peacemaker.  Several years ago-no active cardiac symptoms at this time.  2.  Hypertension-continue Imdur, metoprolol, lisinopril  3.  Stage III adenocarcinoma of left breast-status post left mastectomy finished chemo.   -Continue to follow-up with oncology as prior scheduled  4.  Osteoporosis-continue Fosamax and calcium vitamin D supplements.  5. DVT Prophylaxis- lovenox  CSW for d/c planning   All the records are reviewed and case discussed with Care Management/Social Workerr. Management plans discussed with the patient, family and they are in agreement.  CODE STATUS: Full Code  TOTAL TIME TAKING CARE OF THIS PATIENT: 30 minutes.   POSSIBLE D/C IN 1-2DAYS, DEPENDING ON CLINICAL CONDITION.   Fritzi Mandes M.D on  07/22/2017 at 10:50 AM  Between 7am to 6pm - Pager - [phone]  After 6pm go to [email or web address] - password [email or web address] [facility]  Office  [phone]  CC: Primary care physician; Ellamae Sia, MD

## 2017-07-22 NOTE — Progress Notes (Signed)
Physical Therapy Treatment Patient Details Name: Julia Gordon MRN: [id number] DOB: Dec 10, 1934 Today's Date: 07/22/2017    History of Present Illness Julia Gordon  is a 83 y.o. female who presents with right knee pain in her leg.  She was getting on her knees to pray when she felt acute pain about her right knee on that leg.  Imaging here in the ED shows distal femur fracture.  Hospitalist called for admission and pt underwent ORIF for R periprosthetic distal femur fracture. She is POD#1 at time of PT evaluation.    PT Comments    Patient does worse with therapy this afternoon.  She requires increased assistance with transfers and ambulation. She is able to hop on LLE and use BUEs to offload in order to move from bed to recliner.  Significant left lower extremity buckling noted this afternoon and patient is much less safe.  She eventually requires heavy assist to get onto bed without losing her balance entirely.  She struggles to maintain touchdown weightbearing status on right lower extremity this afternoon.  He is able to complete limited exercise and bed but struggles with straight leg raise due to weakness and pain.  Patient will required SNF at discharge. Pt will benefit from PT services to address deficits in strength, balance, and mobility in order to return to full function at home.   Follow Up Recommendations  SNF     Equipment Recommendations  Rolling walker with 5" wheels    Recommendations for Other Services       Precautions / Restrictions Precautions Precautions: Fall Restrictions Weight Bearing Restrictions: Yes RLE Weight Bearing: Touchdown weight bearing    Mobility  Bed Mobility Overal bed mobility: Needs Assistance Bed Mobility: Sit to Supine     Supine to sit: Min assist Sit to supine: Min assist;+2 for physical assistance   General bed mobility comments: Pt requires assist for LE and trunk management in order to back in bed.  Transfers Overall transfer  level: Needs assistance Equipment used: Rolling walker (2 wheeled) Transfers: Sit to/from Stand Sit to Stand: +2 physical assistance;Mod assist         General transfer comment: Patient requires moderate assistance of 2 in order to come to standing.  Multiple attempts required.  Perform sit to stand transfers to and from recliner as well as bedside commode.  Patient initially with heavy leaning posteriorly and requires cues in order to shift weight forward.  Eventually patient is stable at contact-guard only but struggles this afternoon to maintain touchdown weightbearing status on right lower extremity.  Ambulation/Gait Ambulation/Gait assistance: +2 physical assistance;Mod assist Gait Distance (Feet): 5 Feet Assistive device: Rolling walker (2 wheeled)   Gait velocity: Below functional limits for household mobility.   General Gait Details: Pt is able to hop on LLE and use BUEs to offload in order to move from bed to recliner.  Significant left lower extremity buckling noted this afternoon and patient is much less safe.  She eventually requires heavy assist to get onto bed without losing her balance entirely.  She struggles to maintain touchdown weightbearing status on right lower extremity this afternoon.   Stairs             Wheelchair Mobility    Modified Rankin (Stroke Patients Only)       Balance Overall balance assessment: Needs assistance Sitting-balance support: No upper extremity supported Sitting balance-Leahy Scale: Good     Standing balance support: Bilateral upper extremity supported Standing balance-Leahy Scale: Fair  Cognition Arousal/Alertness: Awake/alert Behavior During Therapy: WFL for tasks assessed/performed Overall Cognitive Status: Within Functional Limits for tasks assessed                                        Exercises General Exercises - Lower Extremity Ankle Circles/Pumps:  Both;10 reps Hip ABduction/ADduction: Right;10 reps Straight Leg Raises: Right;10 reps    General Comments        Pertinent Vitals/Pain Pain Assessment: No/denies pain    Home Living Family/patient expects to be discharged to:: Private residence Living Arrangements: Alone Available Help at Discharge: Family Type of Home: House Home Access: Stairs to enter Entrance Stairs-Rails: None Home Layout: One level Home Equipment: Cane - single point      Prior Function Level of Independence: Independent      Comments: Pt reports independence with ADLs/IADLs. No falls in the last 12 months   PT Goals (current goals can now be found in the care plan section) Acute Rehab PT Goals Patient Stated Goal: Return to prior level of function PT Goal Formulation: With patient Time For Goal Achievement: 08/05/17 Potential to Achieve Goals: Good Progress towards PT goals: Progressing toward goals    Frequency    BID      PT Plan Current plan remains appropriate    Co-evaluation              AM-PAC PT "6 Clicks" Daily Activity  Outcome Measure  Difficulty turning over in bed (including adjusting bedclothes, sheets and blankets)?: Unable Difficulty moving from lying on back to sitting on the side of the bed? : Unable Difficulty sitting down on and standing up from a chair with arms (e.g., wheelchair, bedside commode, etc,.)?: Unable Help needed moving to and from a bed to chair (including a wheelchair)?: A Lot Help needed walking in hospital room?: A Lot Help needed climbing 3-5 steps with a railing? : Total 6 Click Score: 8    End of Session Equipment Utilized During Treatment: Gait belt Activity Tolerance: Patient tolerated treatment well Patient left: in bed;with bed alarm set;with call bell/phone within reach;with SCD's reapplied Nurse Communication: Mobility status PT Visit Diagnosis: Unsteadiness on feet (R26.81);Other abnormalities of gait and mobility  (R26.89);Muscle weakness (generalized) (M62.81)     Time: 2482-5003 PT Time Calculation (min) (ACUTE ONLY): 21 min  Charges:  $Therapeutic Activity: 8-22 mins                    G Codes:       Jason D Huprich PT, DPT, GCS  Huprich,Jason 07/22/2017, 3:13 PM

## 2017-07-22 NOTE — Progress Notes (Signed)
Physical Therapy Evaluation Patient Details Name: Julia Gordon MRN: [id number] DOB: 11-08-1934 Today's Date: 07/22/2017   History of Present Illness  Julia Gordon  is a 83 y.o. female who presents with right knee pain in her leg.  She was getting on her knees to pray when she felt acute pain about her right knee on that leg.  Imaging here in the ED shows distal femur fracture.  Hospitalist called for admission and pt underwent ORIF for R periprosthetic distal femur fracture. She is POD#1 at time of PT evaluation.  Clinical Impression  Pt admitted with above diagnosis. Pt currently with functional limitations due to the deficits listed below (see PT Problem List).   Pt requires assist for LE and trunk management in order to come up to sitting at EOB. Pt requires minA+2 to come to standing but once upright she is steady in standing with UE support on rolling walker. Pt is able to hop on LLE and use BUEs to offload in order to move from bed to recliner. Decreased R foot clearance but pt able to maintain TTWB on LLE. Pt will need SNF placement at discharge. Pt will benefit from PT services to address deficits in strength, balance, and mobility in order to return to full function at home.       Follow Up Recommendations SNF    Equipment Recommendations  Rolling walker with 5" wheels    Recommendations for Other Services       Precautions / Restrictions Precautions Precautions: Fall Restrictions Weight Bearing Restrictions: Yes RLE Weight Bearing: Touchdown weight bearing      Mobility  Bed Mobility Overal bed mobility: Needs Assistance Bed Mobility: Supine to Sit     Supine to sit: Min assist     General bed mobility comments: Pt requires assist for LE and trunk management in order to come up to sitting at EOB  Transfers Overall transfer level: Needs assistance Equipment used: None Transfers: Sit to/from Stand Sit to Stand: Min assist;+2 physical assistance         General  transfer comment: Pt requires minA+2 to come to standing. Once upright she is steady in standing with UE support on rolling walker  Ambulation/Gait Ambulation/Gait assistance: Min assist;+2 physical assistance Gait Distance (Feet): 3 Feet Assistive device: Rolling walker (2 wheeled)       General Gait Details: Pt is able to hop on LLE and use BUEs to offload in order to move from bed to recliner. Decreased R foot clearance but pt able to maintain TTWB on LLE.  Stairs            Wheelchair Mobility    Modified Rankin (Stroke Patients Only)       Balance Overall balance assessment: Needs assistance Sitting-balance support: No upper extremity supported Sitting balance-Leahy Scale: Good     Standing balance support: Bilateral upper extremity supported Standing balance-Leahy Scale: Fair                               Pertinent Vitals/Pain Pain Assessment: No/denies pain    Home Living Family/patient expects to be discharged to:: Private residence Living Arrangements: Alone Available Help at Discharge: Family Type of Home: House Home Access: Stairs to enter Entrance Stairs-Rails: None Entrance Stairs-Number of Steps: 2 Home Layout: One level Home Equipment: Cane - single point      Prior Function Level of Independence: Independent  Comments: Pt reports independence with ADLs/IADLs. No falls in the last 12 months     Hand Dominance   Dominant Hand: Right    Extremity/Trunk Assessment   Upper Extremity Assessment Upper Extremity Assessment: Overall WFL for tasks assessed    Lower Extremity Assessment Lower Extremity Assessment: RLE deficits/detail RLE Deficits / Details: Requires assist for R SLR. R knee immobilizer donned       Communication   Communication: No difficulties  Cognition Arousal/Alertness: Awake/alert Behavior During Therapy: WFL for tasks assessed/performed Overall Cognitive Status: Within Functional Limits for  tasks assessed                                        General Comments      Exercises     Assessment/Plan    PT Assessment Patient needs continued PT services  PT Problem List Decreased strength;Decreased activity tolerance;Decreased balance;Pain       PT Treatment Interventions DME instruction;Gait training;Stair training;Functional mobility training;Therapeutic activities;Therapeutic exercise;Balance training;Neuromuscular re-education;Patient/family education    PT Goals (Current goals can be found in the Care Plan section)  Acute Rehab PT Goals Patient Stated Goal: Return to prior level of function PT Goal Formulation: With patient Time For Goal Achievement: 08/05/17 Potential to Achieve Goals: Good    Frequency BID   Barriers to discharge        Co-evaluation               AM-PAC PT "6 Clicks" Daily Activity  Outcome Measure Difficulty turning over in bed (including adjusting bedclothes, sheets and blankets)?: Unable Difficulty moving from lying on back to sitting on the side of the bed? : Unable Difficulty sitting down on and standing up from a chair with arms (e.g., wheelchair, bedside commode, etc,.)?: Unable Help needed moving to and from a bed to chair (including a wheelchair)?: A Little Help needed walking in hospital room?: A Lot Help needed climbing 3-5 steps with a railing? : Total 6 Click Score: 9    End of Session Equipment Utilized During Treatment: Gait belt Activity Tolerance: Patient tolerated treatment well Patient left: in chair;with call bell/phone within reach;with chair alarm set;Other (comment)(RLE elevated on pillow) Nurse Communication: Mobility status PT Visit Diagnosis: Unsteadiness on feet (R26.81);Other abnormalities of gait and mobility (R26.89);Muscle weakness (generalized) (M62.81)    Time: 1119-1140 PT Time Calculation (min) (ACUTE ONLY): 21 min   Charges:   PT Evaluation $PT Eval Low Complexity: 1  Low     PT G Codes:        Daven Pinckney D Robynn Marcel PT, DPT, GCS   Norma Montemurro 07/22/2017, 1:31 PM

## 2017-07-22 NOTE — Clinical Social Work Placement (Signed)
   CLINICAL SOCIAL WORK PLACEMENT  NOTE  Date:  07/22/2017  Patient Details  Name: Julia Gordon MRN: [id number] Date of Birth: August 09, 1934  Clinical Social Work is seeking post-discharge placement for this patient at the [place] level of care (*CSW will initial, date and re-position this form in  chart as items are completed):  Yes   Patient/family provided with [place] Work Department's list of facilities offering this level of care within the geographic area requested by the patient (or if unable, by the patient's family).  Yes   Patient/family informed of their freedom to choose among providers that offer the needed level of care, that participate in Medicare, Medicaid or managed care program needed by the patient, have an available bed and are willing to accept the patient.  Yes   Patient/family informed of [place]'s ownership interest in [facility] and [facility], as well as of the fact that they are under no obligation to receive care at these facilities.  PASRR submitted to EDS on 07/20/17     PASRR number received on 07/20/17     Existing PASRR number confirmed on       FL2 transmitted to all facilities in geographic area requested by pt/family on 07/21/17     FL2 transmitted to all facilities within larger geographic area on       Patient informed that his/her managed care company has contracts with or will negotiate with certain facilities, including the following:        Yes   Patient/family informed of bed offers received.  Patient chooses bed at [name] )     Physician recommends and patient chooses bed at      Patient to be transferred to   on  .  Patient to be transferred to facility by       Patient family notified on   of transfer.  Name of family member notified:        PHYSICIAN       Additional Comment:    _______________________________________________ Maraki Macquarrie, Veronia Beets, LCSW 07/22/2017,  11:32 AM

## 2017-07-22 NOTE — Anesthesia Postprocedure Evaluation (Signed)
Anesthesia Post Note  Patient: Julia Gordon  Procedure(s) Performed: OPEN REDUCTION INTERNAL FIXATION (ORIF) DISTAL FEMUR FRACTURE (Right )  Patient location during evaluation: Nursing Unit Anesthesia Type: Spinal Level of consciousness: awake, awake and alert and oriented Pain management: pain level controlled Vital Signs Assessment: post-procedure vital signs reviewed and stable Respiratory status: spontaneous breathing, nonlabored ventilation and respiratory function stable Cardiovascular status: stable Anesthetic complications: no     Last Vitals:  Vitals:   07/21/17 2352 07/22/17 0409  BP: 118/64 (!) 92/56  Pulse: 62 (!) 56  Resp: 19 18  Temp: (!) 36.4 C 36.7 C  SpO2: 99% 99%    Last Pain:  Vitals:   07/22/17 0409  TempSrc: Oral  PainSc:                  Lance Muss

## 2017-07-22 NOTE — Progress Notes (Signed)
Clinical Education officer, museum (CSW) started [employer] authorization through [employer].   [employer], LCSW [phone]

## 2017-07-22 NOTE — Progress Notes (Signed)
Subjective:  Patient reports pain as mild.   Objective:   VITALS:   Vitals:   07/21/17 1952 07/21/17 2055 07/21/17 2352 07/22/17 0409  BP: 126/68 112/62 118/64 (!) 92/56  Pulse: 63 (!) 57 62 (!) 56  Resp: 19 19 19 18   Temp: (!) 97.4 F (36.3 C) 97.7 F (36.5 C) (!) 97.5 F (36.4 C) 98 F (36.7 C)  TempSrc: Oral Oral Oral Oral  SpO2: 100% 100% 99% 99%  Weight:      Height:        PHYSICAL EXAM:  Sensation intact distally Dorsiflexion/Plantar flexion intact Incision: dressing C/D/I Compartment soft  LABS  Results for orders placed or performed during the hospital encounter of 07/19/17 (from the past 24 hour(s))  Basic metabolic panel     Status: Abnormal   Collection Time: 07/22/17  3:50 AM  Result Value Ref Range   Sodium 131 (L) 135 - 145 mmol/L   Potassium 3.7 3.5 - 5.1 mmol/L   Chloride 102 98 - 111 mmol/L   CO2 22 22 - 32 mmol/L   Glucose, Bld 112 (H) 70 - 99 mg/dL   BUN 12 8 - 23 mg/dL   Creatinine, Ser 0.98 0.44 - 1.00 mg/dL   Calcium 7.7 (L) 8.9 - 10.3 mg/dL   GFR calc non Af Amer 53 (L) >60 mL/min   GFR calc Af Amer >60 >60 mL/min   Anion gap 7 5 - 15  CBC     Status: Abnormal   Collection Time: 07/22/17  3:50 AM  Result Value Ref Range   WBC 8.0 3.6 - 11.0 K/uL   RBC 3.21 (L) 3.80 - 5.20 MIL/uL   Hemoglobin 10.9 (L) 12.0 - 16.0 g/dL   HCT 32.5 (L) 35.0 - 47.0 %   MCV 101.2 (H) 80.0 - 100.0 fL   MCH 34.0 26.0 - 34.0 pg   MCHC 33.6 32.0 - 36.0 g/dL   RDW 15.4 (H) 11.5 - 14.5 %   Platelets 139 (L) 150 - 440 K/uL    Dg C-arm 1-60 Min  Result Date: 07/21/2017 CLINICAL DATA:  83 year old female with right femur fracture. Subsequent encounter. EXAM: DG C-ARM 61-120 MIN; RIGHT FEMUR 2 VIEWS Fluoroscopic time 53 seconds. COMPARISON:  07/19/2017 plain film exam. FINDINGS: Nine intraoperative C-arm views submitted for review after surgery. Sideplate and screws utilized to transfix distal right femur fracture (just above knee prosthesis). Much better  alignment of fracture fragments. IMPRESSION: Open reduction and internal fixation of right femur fracture. Electronically Signed   By: Genia Del M.D.   On: 07/21/2017 13:53   Dg Femur, Min 2 Views Right  Result Date: 07/21/2017 CLINICAL DATA:  83 year old female with right femur fracture. Subsequent encounter. EXAM: DG C-ARM 61-120 MIN; RIGHT FEMUR 2 VIEWS Fluoroscopic time 53 seconds. COMPARISON:  07/19/2017 plain film exam. FINDINGS: Nine intraoperative C-arm views submitted for review after surgery. Sideplate and screws utilized to transfix distal right femur fracture (just above knee prosthesis). Much better alignment of fracture fragments. IMPRESSION: Open reduction and internal fixation of right femur fracture. Electronically Signed   By: Genia Del M.D.   On: 07/21/2017 13:53    Assessment/Plan: 1 Day Post-Op   Principal Problem:   Femur fracture ([place]) Active Problems:   Chronic systolic congestive heart failure (HCC)   Dyslipidemia   HTN (hypertension)   PAF (paroxysmal atrial fibrillation) (HCC)   Advance diet Up with therapy, TDWB D/C IV fluids DVT prophylaxis with Lovenox  Discharge to SNF when medically  stable, cleared from orthopedic standpoint Follow up with me in 14 to 16 days   Lovell Sheehan , MD 07/22/2017, 7:39 AM

## 2017-07-23 MED ORDER — ENOXAPARIN SODIUM 40 MG/0.4ML ~~LOC~~ SOLN: 40.0000 mg | SUBCUTANEOUS | 0 refills | Status: DC

## 2017-07-23 MED ORDER — TRAMADOL HCL 50 MG PO TABS: 50.0000 mg | ORAL_TABLET | Freq: Three times a day (TID) | ORAL | 0 refills | Status: DC | PRN

## 2017-07-23 MED ORDER — TRAMADOL HCL 50 MG PO TABS: 50.0000 mg | ORAL_TABLET | Freq: Three times a day (TID) | ORAL | Status: DC | PRN

## 2017-07-23 MED ORDER — FUROSEMIDE 40 MG PO TABS: 20.0000 mg | ORAL_TABLET | Freq: Every day | ORAL | 0 refills | Status: DC | PRN

## 2017-07-23 NOTE — Clinical Social Work Placement (Signed)
   CLINICAL SOCIAL WORK PLACEMENT  NOTE  Date:  07/23/2017  Patient Details  Name: Julia Gordon MRN: [id number] Date of Birth: Jan 03, 1935  Clinical Social Work is seeking post-discharge placement for this patient at the [place] level of care (*CSW will initial, date and re-position this form in  chart as items are completed):  Yes   Patient/family provided with [place] Work Department's list of facilities offering this level of care within the geographic area requested by the patient (or if unable, by the patient's family).  Yes   Patient/family informed of their freedom to choose among providers that offer the needed level of care, that participate in Medicare, Medicaid or managed care program needed by the patient, have an available bed and are willing to accept the patient.  Yes   Patient/family informed of [place]'s ownership interest in [facility] and [facility], as well as of the fact that they are under no obligation to receive care at these facilities.  PASRR submitted to EDS on 07/20/17     PASRR number received on 07/20/17     Existing PASRR number confirmed on       FL2 transmitted to all facilities in geographic area requested by pt/family on 07/21/17     FL2 transmitted to all facilities within larger geographic area on       Patient informed that his/her managed care company has contracts with or will negotiate with certain facilities, including the following:        Yes   Patient/family informed of bed offers received.  Patient chooses bed at [facility] )     Physician recommends and patient chooses bed at      Patient to be transferred to [facility] ) on 07/23/17.  Patient to be transferred to facility by [facility] )     Patient family notified on 07/23/17 of transfer.  Name of family member notified:  (Patient's daughter [name] is aware of D/C today. )     PHYSICIAN       Additional  Comment:    _______________________________________________ Mattheus Rauls, Veronia Beets, LCSW 07/23/2017, 9:43 AM

## 2017-07-23 NOTE — Progress Notes (Signed)
Clinical Education officer, museum (CSW) received a call from [place] case manager yesterday 07/22/17 at 6 pm to give SNF authorization. Authorization # [id number], RVC. Patient can D/C to [facility] when medically stable. [facility] admissions coordinator at [facility] is aware of above.   [employer], LCSW [phone]

## 2017-07-23 NOTE — Care Management Important Message (Signed)
Important Message  Patient Details  Name: Julia Gordon MRN: [id number] Date of Birth: [birth year]   Medicare Important Message Given:  Yes    [name] [name] 07/23/2017, 10:36 AM

## 2017-07-23 NOTE — Progress Notes (Signed)
Physical Therapy Treatment Patient Details Name: Julia Gordon MRN: [id number] DOB: 01-Oct-1934 Today's Date: 07/23/2017    History of Present Illness Julia Gordon  is a 83 y.o. female who presents with right knee pain in her leg.  She was getting on her knees to pray when she felt acute pain about her right knee on that leg.  Imaging here in the ED shows distal femur fracture.  Hospitalist called for admission and pt underwent ORIF for R periprosthetic distal femur fracture. She is POD#1 at time of PT evaluation.    PT Comments    Patient is weaker today than she was yesterday.  She continues to require assistance for bed mobility. Patient requires very heavy assistance for transfers today.  Cues provided for anterior weight shifting and safe hand placement.  Even once upright she requires cues to move her feet under for her trunk and to steady herself with hands on her walker.  Performed multiple transfers with patient from bed to recliner and recliner to bedside commode.  She demonstrates poor ability today to keep weight off of her right lower extremity and maintain toe-touch weightbearing status.  She is unable to safely ambulate today.  She is able to complete all exercises as instructed by therapist.  Plan is for discharge today if patient is able to have BM per medical note. Pt will benefit from PT services to address deficits in strength, balance, and mobility in order to return to full function at home.   Follow Up Recommendations  SNF     Equipment Recommendations  Rolling walker with 5" wheels    Recommendations for Other Services       Precautions / Restrictions Precautions Precautions: Fall Restrictions Weight Bearing Restrictions: Yes RLE Weight Bearing: Touchdown weight bearing    Mobility  Bed Mobility Overal bed mobility: Needs Assistance Bed Mobility: Supine to Sit     Supine to sit: Mod assist     General bed mobility comments: Pt requires assist for LE and  trunk management in order to get up to EOB.  She requires assistance to scoot forward to get feet on the floor.  Transfers Overall transfer level: Needs assistance Equipment used: Rolling walker (2 wheeled) Transfers: Sit to/from Stand Sit to Stand: +2 physical assistance;Mod assist         General transfer comment: Patient continues to require very heavy assistance for transfers.  Cues provided for anterior weight shifting and safe hand placement.  Even once upright she requires cues to move her feet under for her trunk and to steady herself with hands on her walker.  Performed multiple transfers with patient from bed to recliner in recliner to bedside commode.  She demonstrates poor ability today to keep weight off of her right lower extremity and maintain toe-touch weightbearing status.  She is unable to ambulate today.  Ambulation/Gait                 Stairs             Wheelchair Mobility    Modified Rankin (Stroke Patients Only)       Balance Overall balance assessment: Needs assistance Sitting-balance support: No upper extremity supported Sitting balance-Leahy Scale: Good     Standing balance support: Bilateral upper extremity supported Standing balance-Leahy Scale: Fair Standing balance comment: Initially poor however improved throughout duration of treatment session.  Cognition Arousal/Alertness: Awake/alert Behavior During Therapy: WFL for tasks assessed/performed Overall Cognitive Status: Within Functional Limits for tasks assessed                                        Exercises General Exercises - Lower Extremity Ankle Circles/Pumps: Both;10 reps Quad Sets: Both;10 reps Gluteal Sets: Both;10 reps Short Arc Quad: Left;10 reps Heel Slides: Left;10 reps Hip ABduction/ADduction: Both;10 reps Straight Leg Raises: Both;10 reps    General Comments        Pertinent Vitals/Pain Pain  Assessment: No/denies pain    Home Living                      Prior Function            PT Goals (current goals can now be found in the care plan section) Acute Rehab PT Goals Patient Stated Goal: Return to prior level of function PT Goal Formulation: With patient Time For Goal Achievement: 08/05/17 Potential to Achieve Goals: Good Progress towards PT goals: Not progressing toward goals - comment(Patient appears weaker today than she did during evaluation )    Frequency    BID      PT Plan Current plan remains appropriate    Co-evaluation              AM-PAC PT "6 Clicks" Daily Activity  Outcome Measure  Difficulty turning over in bed (including adjusting bedclothes, sheets and blankets)?: Unable Difficulty moving from lying on back to sitting on the side of the bed? : Unable Difficulty sitting down on and standing up from a chair with arms (e.g., wheelchair, bedside commode, etc,.)?: Unable Help needed moving to and from a bed to chair (including a wheelchair)?: A Lot Help needed walking in hospital room?: Total Help needed climbing 3-5 steps with a railing? : Total 6 Click Score: 7    End of Session Equipment Utilized During Treatment: Gait belt Activity Tolerance: Patient tolerated treatment well Patient left: with call bell/phone within reach;in chair;with chair alarm set Nurse Communication: Mobility status PT Visit Diagnosis: Unsteadiness on feet (R26.81);Other abnormalities of gait and mobility (R26.89);Muscle weakness (generalized) (M62.81)     Time: 5035-4656 PT Time Calculation (min) (ACUTE ONLY): 29 min  Charges:  $Therapeutic Exercise: 8-22 mins $Therapeutic Activity: 8-22 mins                    G Codes:       Julia Gordon D Julia Gordon PT, DPT, GCS    Julia Gordon 07/23/2017, 11:11 AM

## 2017-07-23 NOTE — Discharge Summary (Signed)
[facility] [place] NAME: Julia Gordon    MR#:  [id number]  DATE OF BIRTH:  1934-08-01  DATE OF ADMISSION:  07/19/2017 ADMITTING PHYSICIAN: Lance Coon, MD  DATE OF DISCHARGE: 07/23/2017  PRIMARY CARE PHYSICIAN: Ellamae Sia, MD    ADMISSION DIAGNOSIS:  Swelling [R60.9] Pain [R52] Pre-op chest exam [P82.423] Closed fracture of distal end of right femur, unspecified fracture morphology, initial encounter ([place]) [S72.401A]  DISCHARGE DIAGNOSIS:  Right Distal femur fracture s/p ORIF distal femur by Dr Harlow Mares on 07/21/2017  SECONDARY DIAGNOSIS:   Past Medical History:  Diagnosis Date  . Anemia   . Atrial fibrillation ([place])    history of  . Breast cancer ([place]) 2015   LT MASTECTOMY  . 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 COPD (chronic obstructive pulmonary disease) ([place])   . Depression   . DJD (degenerative joint disease) of knee    right knee  . H/O hysterectomy with oophorectomy    for DUB  . History of depression   . HLD (hyperlipidemia)   . HTN (hypertension)   . LV (left ventricular) mural thrombus    history of, resolved (echo 04/09)  . Other primary cardiomyopathies   . Pacemaker 05/2007   [employer] ICD, Guidant lead, [facility] Dr Isaias Sakai  . Personal history of chemotherapy 2015   BREAST CA    HOSPITAL COURSE:   83 year old female with past medical history significant for history of breast cancer status post mastectomy on the left, COPD, hypertension, atrial fibrillation not on anticoagulation presents to hospital secondary to distal right femoral fracture.  1.  Right periprosthetic distal femur fracture-orthopedics consult with dr Harlow Mares noted -POD # 2OPEN REDUCTION INTERNAL FIXATION (ORIF) DISTAL FEMUR FRACTURE, RIGHT -Continue pain control, physical therapy yto see today -Acceptable risk for surgery-has history of   A. fib, status post peacemaker.  Several years ago-no active cardiac symptoms at this  time. Pt a bit sleepy today--will d/c high dose of Gabapentin (started post op by ortho)  2.  Hypertension-continue Imdur, metoprolol, lisinopril  3.  Stage III adenocarcinoma of left breast-status post left mastectomy finished chemo.   -Continue to follow-up with oncology as prior scheduled  4.  Osteoporosis-continue Fosamax and calcium vitamin D supplements.  5. DVT Prophylaxis- lovenox  CSW for d/c planning--overall better. D/c to rehab today   CONSULTS OBTAINED:  Treatment Team:  Lovell Sheehan, MD  DRUG ALLERGIES:   Allergies  Allergen Reactions  . No Known Allergies     DISCHARGE MEDICATIONS:   Allergies as of 07/23/2017      Reactions   No Known Allergies       Medication List    STOP taking these medications   tamoxifen 20 MG tablet Commonly known as:  NOLVADEX     TAKE these medications   alendronate 70 MG tablet Commonly known as:  FOSAMAX TAKE 1 TAB WEEKLY 30 MINS BEFORE BREAKFAST WITH A FULL GLASS OF WATER. DO NOT LIE DOWN FOR 30 MINS   aspirin 81 MG tablet Take 81 mg by mouth daily.   enoxaparin 40 MG/0.4ML injection Commonly known as:  LOVENOX Inject 0.4 mLs (40 mg total) into the skin daily.   furosemide 40 MG tablet Commonly known as:  LASIX Take 0.5 tablets (20 mg total) by mouth daily as needed. For swelling of legs or weight gain >5 lbs than your baseline What changed:    how much to take  when to take  this  reasons to take this  additional instructions   ibuprofen 200 MG tablet Commonly known as:  ADVIL,MOTRIN Take 200 mg by mouth every 6 (six) hours as needed for mild pain or moderate pain.   isosorbide mononitrate 30 MG 24 hr tablet Commonly known as:  IMDUR Take 1 tablet by mouth Daily.   KLOR-CON M20 20 MEQ tablet Generic drug:  potassium chloride SA Take 1 tablet by mouth Daily.   lisinopril 20 MG tablet Commonly known as:  PRINIVIL,ZESTRIL Take 20 mg by mouth daily.   lovastatin 40 MG tablet Commonly  known as:  MEVACOR Take 2 tablets by mouth Daily.   metoprolol succinate 50 MG 24 hr tablet Commonly known as:  TOPROL-XL Take 50 mg by mouth daily.   MULTIPLE VITAMIN PO Take by mouth daily.   spironolactone 50 MG tablet Commonly known as:  ALDACTONE Take 50 mg by mouth daily.   traMADol 50 MG tablet Commonly known as:  ULTRAM Take 1 tablet (50 mg total) by mouth every 8 (eight) hours as needed for severe pain.       If you experience worsening of your admission symptoms, develop shortness of breath, life threatening emergency, suicidal or homicidal thoughts you must seek medical attention immediately by calling 911 or calling your MD immediately  if symptoms less severe.  You Must read complete instructions/literature along with all the possible adverse reactions/side effects for all the Medicines you take and that have been prescribed to you. Take any new Medicines after you have completely understood and accept all the possible adverse reactions/side effects.   Please note  You were cared for by a hospitalist during your hospital stay. If you have any questions about your discharge medications or the care you received while you were in the hospital after you are discharged, you can call the unit and asked to speak with the hospitalist on call if the hospitalist that took care of you is not available. Once you are discharged, your primary care physician will handle any further medical issues. Please note that NO REFILLS for any discharge medications will be authorized once you are discharged, as it is imperative that you return to your primary care physician (or establish a relationship with a primary care physician if you do not have one) for your aftercare needs so that they can reassess your need for medications and monitor your lab values. Today   SUBJECTIVE   Doing well. A bit sleepy this am...trying to eat BF  VITAL SIGNS:  Blood pressure 113/73, pulse 67, temperature 97.9  F (36.6 C), temperature source Oral, resp. rate 19, height 5\' 2"  (1.575 m), weight 72.6 kg (160 lb), SpO2 100 %.  I/O:    Intake/Output Summary (Last 24 hours) at 07/23/2017 0901 Last data filed at 07/23/2017 0330 Gross per 24 hour  Intake 240 ml  Output 100 ml  Net 140 ml    PHYSICAL EXAMINATION:  GENERAL:  83 y.o.-year-old patient lying in the bed with no acute distress.  EYES: Pupils equal, round, reactive to light and accommodation. No scleral icterus. Extraocular muscles intact.  HEENT: Head atraumatic, normocephalic. Oropharynx and nasopharynx clear.  NECK:  Supple, no jugular venous distention. No thyroid enlargement, no tenderness.  LUNGS: Normal breath sounds bilaterally, no wheezing, rales,rhonchi or crepitation. No use of accessory muscles of respiration.  CARDIOVASCULAR: S1, S2 normal. No murmurs, rubs, or gallops.  ABDOMEN: Soft, non-tender, non-distended. Bowel sounds present. No organomegaly or mass.  EXTREMITIES: No pedal edema,  cyanosis, or clubbing.  NEUROLOGIC: Cranial nerves II through XII are intact. Muscle strength 5/5 in all extremities. Sensation intact. Gait not checked.  PSYCHIATRIC: The patient is alert and oriented x 3.  SKIN: No obvious rash, lesion, or ulcer.   DATA REVIEW:   CBC  Recent Labs  Lab 07/22/17 0350  WBC 8.0  HGB 10.9*  HCT 32.5*  PLT 139*    Chemistries  Recent Labs  Lab 07/19/17 2209  07/22/17 0350  NA 137   < > 131*  K 3.2*   < > 3.7  CL 108   < > 102  CO2 23   < > 22  GLUCOSE 96   < > 112*  BUN 23   < > 12  CREATININE 0.96   < > 0.98  CALCIUM 7.6*   < > 7.7*  AST 22  --   --   ALT 13  --   --   ALKPHOS 23*  --   --   BILITOT 0.6  --   --    < > = values in this interval not displayed.    Microbiology Results   Recent Results (from the past 240 hour(s))  MRSA PCR Screening     Status: None   Collection Time: 07/20/17  6:42 PM  Result Value Ref Range Status   MRSA by PCR NEGATIVE NEGATIVE Final    Comment:         The GeneXpert MRSA Assay (FDA approved for NASAL specimens only), is one component of a comprehensive MRSA colonization surveillance program. It is not intended to diagnose MRSA infection nor to guide or monitor treatment for MRSA infections. Performed at [facility], [address], [place], [place] [address]     RADIOLOGY:  Dg C-arm 1-60 Min  Result Date: 07/21/2017 CLINICAL DATA:  83 year old female with right femur fracture. Subsequent encounter. EXAM: DG C-ARM 61-120 MIN; RIGHT FEMUR 2 VIEWS Fluoroscopic time 53 seconds. COMPARISON:  07/19/2017 plain film exam. FINDINGS: Nine intraoperative C-arm views submitted for review after surgery. Sideplate and screws utilized to transfix distal right femur fracture (just above knee prosthesis). Much better alignment of fracture fragments. IMPRESSION: Open reduction and internal fixation of right femur fracture. Electronically Signed   By: Genia Del M.D.   On: 07/21/2017 13:53   Dg Femur, Min 2 Views Right  Result Date: 07/21/2017 CLINICAL DATA:  83 year old female with right femur fracture. Subsequent encounter. EXAM: DG C-ARM 61-120 MIN; RIGHT FEMUR 2 VIEWS Fluoroscopic time 53 seconds. COMPARISON:  07/19/2017 plain film exam. FINDINGS: Nine intraoperative C-arm views submitted for review after surgery. Sideplate and screws utilized to transfix distal right femur fracture (just above knee prosthesis). Much better alignment of fracture fragments. IMPRESSION: Open reduction and internal fixation of right femur fracture. Electronically Signed   By: Genia Del M.D.   On: 07/21/2017 13:53     Management plans discussed with the patient, family and they are in agreement.  CODE STATUS:     Code Status Orders  (From admission, onward)        Start     Ordered   07/20/17 0231  Full code  Continuous     07/20/17 0230    Code Status History    This patient has a current code status but no historical code status.       TOTAL TIME TAKING CARE OF THIS PATIENT: 40 minutes.    Fritzi Mandes M.D on 07/23/2017 at 9:01 AM  Between  7am to 6pm - Pager - [phone] After 6pm go to [email or web address] - password [email or web address] [facility]  Office  [phone]  CC: Primary care physician; Ellamae Sia, MD

## 2017-07-23 NOTE — Progress Notes (Signed)
Patient is being discharged to [facility]. Report called. Patient had 2 BMs this shift. Paperwork sent with signed script. Waiting for EMS at this time.

## 2017-07-23 NOTE — Progress Notes (Signed)
Patient is medically stable for D/C to [facility] today. [employer] SNF authorization through [facility] health has been received. Per [facility] admissions coordinator at [facility] patient can come today to room 406. RN will call report and arrange EMS for transport. Clinical Education officer, museum (CSW) sent D/C orders to [facility] via [place]. Patient is aware of above. CSW contacted patient's daughter [name] and made her aware of above. Please reconsult if future social work needs arise. CSW signing off.   [employer], LCSW [phone]

## 2017-07-24 DIAGNOSIS — C50919 Malignant neoplasm of unspecified site of unspecified female breast: Secondary | ICD-10-CM | POA: Insufficient documentation

## 2017-07-24 DIAGNOSIS — M8000XD Age-related osteoporosis with current pathological fracture, unspecified site, subsequent encounter for fracture with routine healing: Secondary | ICD-10-CM | POA: Insufficient documentation

## 2017-08-06 ENCOUNTER — Ambulatory Visit: Payer: BC Managed Care – PPO | Admitting: Oncology

## 2017-08-10 ENCOUNTER — Other Ambulatory Visit: Payer: Self-pay | Admitting: Oncology

## 2017-08-16 NOTE — Progress Notes (Signed)
[facility]  Telephone:([phone]) [phone] Fax:(336) [phone]  ID: Julia Gordon OB: 1934-10-01  MR#: [id number]  HCW#:[id number]  Patient Care Team: Ellamae Sia, MD as PCP - General (Internal Medicine)  CHIEF COMPLAINT: Pathologic stage IIIa adenocarcinoma of the upper outer quadrant of left breast, ER/PR positive, HER-2 not overexpressing, status post neoadjuvant chemotherapy and mastectomy.  INTERVAL HISTORY: Patient returns to clinic today for routine six-month evaluation.  She continues to tolerate tamoxifen well without significant side effects.  Currently, she feels well and is asymptomatic.  She has no neurologic complaints.  She denies any recent fevers or illnesses.  She has good appetite and denies weight loss. She denies any chest pain, shortness of breath, cough, or hemoptysis.  She has no nausea, vomiting, constipation, or diarrhea.  She has no urinary complaints.  Patient offers no specific complaints today.   REVIEW OF SYSTEMS:   Review of Systems  Constitutional: Negative.  Negative for fever, malaise/fatigue and weight loss.  Respiratory: Negative.  Negative for cough and shortness of breath.   Cardiovascular: Negative.  Negative for chest pain and leg swelling.  Gastrointestinal: Negative.  Negative for abdominal pain and constipation.  Genitourinary: Negative.  Negative for dysuria.  Musculoskeletal: Negative.  Negative for back pain.  Skin: Negative.  Negative for rash.  Neurological: Negative.  Negative for focal weakness, weakness and headaches.  Endo/Heme/Allergies: Negative.   Psychiatric/Behavioral: Negative.  The patient is not nervous/anxious.     As per HPI. Otherwise, a complete review of systems is negative.  PAST MEDICAL HISTORY: Past Medical History:  Diagnosis Date  . Anemia   . Atrial fibrillation ([place])    history of  . Breast cancer ([place]) 2015   LT MASTECTOMY  . 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 COPD (chronic obstructive  pulmonary disease) ([place])   . Depression   . DJD (degenerative joint disease) of knee    right knee  . H/O hysterectomy with oophorectomy    for DUB  . History of depression   . HLD (hyperlipidemia)   . HTN (hypertension)   . LV (left ventricular) mural thrombus    history of, resolved (echo 04/09)  . Other primary cardiomyopathies   . Pacemaker 05/2007   [employer] ICD, Guidant lead, [facility] Dr Isaias Sakai  . Personal history of chemotherapy 2015   BREAST CA    PAST SURGICAL HISTORY: Past Surgical History:  Procedure Laterality Date  . ABDOMINAL HYSTERECTOMY    . BREAST BIOPSY Left 05/10/13   [place] bx-positive  . COLONOSCOPY WITH PROPOFOL N/A 01/15/2017   Procedure: COLONOSCOPY WITH PROPOFOL;  Surgeon: Jonathon Bellows, MD;  Location: [facility] ENDOSCOPY;  Service: Gastroenterology;  Laterality: N/A;  . LAPAROSCOPIC RIGHT COLON RESECTION  08/10   Dr Pat Patrick, for adenoca   . MASTECTOMY Left 2015   BREAST CA  . OOPHORECTOMY    . ORIF FEMUR FRACTURE Right 07/21/2017   Procedure: OPEN REDUCTION INTERNAL FIXATION (ORIF) DISTAL FEMUR FRACTURE;  Surgeon: Lovell Sheehan, MD;  Location: [facility] ORS;  Service: Orthopedics;  Laterality: Right;  femur   . PACEMAKER INSERTION  2009  . REPLACEMENT TOTAL KNEE  1990's   right     FAMILY HISTORY Family History  Problem Relation Age of Onset  . Hypertension Father   . Alzheimer's disease Mother   . Breast cancer Sister 52  . Diabetes Unknown        brothers  . Stroke Brother   . Heart failure Brother   . Lupus  Sister        ADVANCED DIRECTIVES:    HEALTH MAINTENANCE: Social History   Tobacco Use  . Smoking status: Former Research scientist (life sciences)  . Smokeless tobacco: Never Used  . Tobacco comment: quit 2006  Substance Use Topics  . Alcohol use: No    Comment: former  . Drug use: No     Allergies  Allergen Reactions  . No Known Allergies     Current Outpatient Medications  Medication Sig Dispense Refill  . alendronate (FOSAMAX) 70 MG tablet TAKE  1 TAB WEEKLY 30 MINS BEFORE BREAKFAST WITH A FULL GLASS OF WATER. DO NOT LIE DOWN FOR 30 MINS 4 tablet 0  . aspirin 81 MG tablet Take 81 mg by mouth daily.    Marland Kitchen enoxaparin (LOVENOX) 40 MG/0.4ML injection Inject 0.4 mLs (40 mg total) into the skin daily. 14 Syringe 0  . furosemide (LASIX) 40 MG tablet Take 0.5 tablets (20 mg total) by mouth daily as needed. For swelling of legs or weight gain >5 lbs than your baseline 20 tablet 0  . ibuprofen (ADVIL,MOTRIN) 200 MG tablet Take 200 mg by mouth every 6 (six) hours as needed for mild pain or moderate pain.     . isosorbide mononitrate (IMDUR) 30 MG 24 hr tablet Take 1 tablet by mouth Daily.    Marland Kitchen KLOR-CON M20 20 MEQ tablet Take 1 tablet by mouth Daily.    Marland Kitchen lisinopril (PRINIVIL,ZESTRIL) 20 MG tablet Take 20 mg by mouth daily.    Marland Kitchen lovastatin (MEVACOR) 40 MG tablet Take 2 tablets by mouth Daily.    . metoprolol succinate (TOPROL-XL) 50 MG 24 hr tablet Take 50 mg by mouth daily.     . MULTIPLE VITAMIN PO Take by mouth daily.    Marland Kitchen spironolactone (ALDACTONE) 50 MG tablet Take 50 mg by mouth daily.    . tamoxifen (NOLVADEX) 20 MG tablet Take 20 mg by mouth daily.    . traMADol (ULTRAM) 50 MG tablet Take 1 tablet (50 mg total) by mouth every 8 (eight) hours as needed for severe pain. 20 tablet 0   No current facility-administered medications for this visit.     OBJECTIVE: Vitals:   08/18/17 1023  BP: (!) 153/85  Pulse: 70  Resp: 20  Temp: (!) 97.3 F (36.3 C)     There is no height or weight on file to calculate BMI.    ECOG FS:0 - Asymptomatic  General: Well-developed, well-nourished, no acute distress.  Sitting in a wheelchair. Eyes: Pink conjunctiva, anicteric sclera. HEENT: Normocephalic, moist mucous membranes. Breast: Exam deferred today. Lungs: Clear to auscultation bilaterally. Heart: Regular rate and rhythm. No rubs, murmurs, or gallops. Abdomen: Soft, nontender, nondistended. No organomegaly noted, normoactive bowel  sounds. Musculoskeletal: No edema, cyanosis, or clubbing. Neuro: Alert, answering all questions appropriately. Cranial nerves grossly intact. Skin: No rashes or petechiae noted. Psych: Normal affect.  LAB RESULTS:  Lab Results  Component Value Date   NA 131 (L) 07/22/2017   K 3.7 07/22/2017   CL 102 07/22/2017   CO2 22 07/22/2017   GLUCOSE 112 (H) 07/22/2017   BUN 12 07/22/2017   CREATININE 0.98 07/22/2017   CALCIUM 7.7 (L) 07/22/2017   PROT 5.6 (L) 07/19/2017   ALBUMIN 3.3 (L) 07/19/2017   AST 22 07/19/2017   ALT 13 07/19/2017   ALKPHOS 23 (L) 07/19/2017   BILITOT 0.6 07/19/2017   GFRNONAA 53 (L) 07/22/2017   GFRAA >60 07/22/2017    Lab Results  Component Value Date  WBC 8.0 07/22/2017   NEUTROABS 3.0 03/24/2016   HGB 10.9 (L) 07/22/2017   HCT 32.5 (L) 07/22/2017   MCV 101.2 (H) 07/22/2017   PLT 139 (L) 07/22/2017   Lab Results  Component Value Date   LABCA2 24.4 03/24/2016     STUDIES: No results found.  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  PLAN:    1. 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 Despite this, left breast mastectomy revealed significant residual disease. Patient underwent total mastectomy on December 13, 2013.  Adjuvant XRT was recommended, but patient refused. She acknowledged that this would increase her risk of recurrence but she still declined. Given patient's worsening osteoporosis, anastrozole was discontinued and patient is now on tamoxifen.  Continue tamoxifen for a total of 5 years completing in January 2021.  Her most recent right screening mammogram on July 14, 2017 was reported as BI-RADS 2.  Return to clinic in 6 months for routine evaluation. 2. Osteoporosis: Patient's most recent bone mineral density on July 14, 2017 revealed a T score of -3.1 which is unchanged from one year prior.  Continue Fosamax, calcium, and vitamin D.  Letrozole was discontinued and patient was initiated on tamoxifen as above.  Repeat in July 2020.  3. Family history: Patient may benefit from genetic testing in the future. 4. Hypertension: Patient's blood pressure is mildly elevated today.  Continue current medications as prescribed.  Patient expressed understanding and was in agreement with this plan. She also understands that She can call clinic at any time with any questions, concerns, or complaints.   Breast cancer   Staging form: Breast, AJCC 7th Edition     Clinical stage from 08/01/2014: Stage IIIA (T1c, N2a, M0) - Signed by Lloyd Huger, MD on 08/01/2014   Lloyd Huger, MD   08/21/2017 4:03 PM

## 2017-08-18 ENCOUNTER — Inpatient Hospital Stay: Payer: Medicare HMO | Attending: Oncology | Admitting: Oncology

## 2017-08-18 ENCOUNTER — Encounter: Payer: Self-pay | Admitting: Oncology

## 2017-08-18 VITALS — BP 153/85 | HR 70 | Temp 97.3°F | Resp 20

## 2017-08-18 DIAGNOSIS — Z170 Estrogen receptor positive status [ER+]: Secondary | ICD-10-CM | POA: Diagnosis not present

## 2017-08-18 DIAGNOSIS — Z79810 Long term (current) use of selective estrogen receptor modulators (SERMs): Secondary | ICD-10-CM | POA: Insufficient documentation

## 2017-08-18 DIAGNOSIS — I10 Essential (primary) hypertension: Secondary | ICD-10-CM

## 2017-08-18 DIAGNOSIS — M810 Age-related osteoporosis without current pathological fracture: Secondary | ICD-10-CM | POA: Insufficient documentation

## 2017-08-18 DIAGNOSIS — C50212 Malignant neoplasm of upper-inner quadrant of left female breast: Secondary | ICD-10-CM | POA: Insufficient documentation

## 2017-08-18 NOTE — Progress Notes (Signed)
Patient denies any concerns today.  

## 2017-10-25 ENCOUNTER — Other Ambulatory Visit: Payer: Self-pay | Admitting: Oncology

## 2018-02-12 NOTE — Progress Notes (Signed)
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  INTERVAL HISTORY: Patient returns to clinic today for routine 69-monthevaluation.  She continues to feel well and remains asymptomatic.  She is tolerating tamoxifen without significant side effects.  She has no neurologic complaints.  She denies any recent fevers or illnesses.  She has a good appetite and denies weight loss. She denies any chest pain, shortness of breath, cough, or hemoptysis.  She has no nausea, vomiting, constipation, or diarrhea.  She has no urinary complaints.  Patient feels at her baseline offers no specific complaints today.  REVIEW OF SYSTEMS:   Review of Systems  Constitutional: Negative.  Negative for fever, malaise/fatigue and weight loss.  Respiratory: Negative.  Negative for cough and shortness of breath.   Cardiovascular: Negative.  Negative for chest pain and leg swelling.  Gastrointestinal: Negative.  Negative for abdominal pain and constipation.  Genitourinary: Negative.  Negative for dysuria.  Musculoskeletal: Negative.  Negative for back pain.  Skin: Negative.  Negative for rash.  Neurological: Negative.  Negative for focal weakness, weakness and headaches.  Endo/Heme/Allergies: Negative.   Psychiatric/Behavioral: Negative.  The patient is not nervous/anxious.     As per HPI. Otherwise, a complete review of systems is negative.  PAST MEDICAL HISTORY: Past Medical History:  Diagnosis Date  . Anemia   . [place]rial [place]    history of  . [place]   LT MASTECTOMY  . Colon adenocarcinoma ([place]   . [place] (chronic  obstructive pulmonary disease) [place]   . Depression   . DJD (degenerative joint disease) of knee    right knee  . H/O hysterectomy with oophorectomy    for DUB  . History of depression   . HLD (hyperlipidemia)   . HTN (hypertension)   . LV (left ventricular) mural thrombus    history of, resolved (echo 04/09)  . Other primary cardiomyopathies   . Pacemaker 05/2007   [employer], [employer] lead, [facility] Dr EIsaias Sakai . Personal history of chemotherapy 2015   BREAST CA    PAST SURGICAL HISTORY: Past Surgical History:  Procedure Laterality Date  . ABDOMINAL HYSTERECTOMY    . BREAST BIOPSY Left 05/10/13   uKoreabx-positive  . COLONOSCOPY WITH PROPOFOL N/A 01/15/2017   Procedure: COLONOSCOPY WITH PROPOFOL;  Surgeon: AJonathon Bellows MD;  [facility]  Service: Gastroenterology;  Laterality: N/A;  . LAPAROSCOPIC RIGHT COLON RESECTION  08/10   Dr EPat Patrick for adenoca   . MASTECTOMY Left 2015   BREAST CA  . OOPHORECTOMY    . ORIF FEMUR FRACTURE Right 07/21/2017   Procedure: OPEN REDUCTION INTERNAL FIXATION (ORIF) DISTAL FEMUR FRACTURE;  Surgeon: BLovell Sheehan MD;  Location: [facility] ORS;  Service: Orthopedics;  Laterality: Right;  femur   . PACEMAKER INSERTION  2009  . REPLACEMENT TOTAL KNEE  1990's   right     FAMILY HISTORY Family History  Problem Relation Age of Onset  . Hypertension Father   . Alzheimer's disease Mother   . Breast cancer Sister 639 . Diabetes Unknown        brothers  . Stroke Brother   . Heart failure Brother   .  Lupus Sister        ADVANCED DIRECTIVES:    HEALTH MAINTENANCE: Social History   Tobacco Use  . Smoking status: Former Research scientist (life sciences)  . Smokeless tobacco: Never Used  . Tobacco comment: quit 2006  Substance Use Topics  . Alcohol use: No    Comment: former  . Drug use: No     Allergies  Allergen Reactions  . No Known Allergies     Current Outpatient Medications  Medication Sig Dispense Refill  . alendronate (FOSAMAX) 70 MG  tablet TAKE 1 TAB WEEKLY 30 MINS BEFORE BREAKFAST WITH A FULL GLASS OF WATER. DO NOT LIE DOWN FOR 30 MINS 12 tablet 3  . aspirin 81 MG tablet Take 81 mg by mouth daily.    Marland Kitchen enoxaparin (LOVENOX) 40 MG/0.4ML injection Inject 0.4 mLs (40 mg total) into the skin daily. 14 Syringe 0  . furosemide (LASIX) 40 MG tablet Take 0.5 tablets (20 mg total) by mouth daily as needed. For swelling of legs or weight gain >5 lbs than your baseline 20 tablet 0  . ibuprofen (ADVIL,MOTRIN) 200 MG tablet Take 200 mg by mouth every 6 (six) hours as needed for mild pain or moderate pain.     . isosorbide mononitrate (IMDUR) 30 MG 24 hr tablet Take 1 tablet by mouth Daily.    Marland Kitchen KLOR-CON M20 20 MEQ tablet Take 1 tablet by mouth Daily.    Marland Kitchen lisinopril (PRINIVIL,ZESTRIL) 20 MG tablet Take 20 mg by mouth daily.    Marland Kitchen lovastatin (MEVACOR) 40 MG tablet Take 2 tablets by mouth Daily.    . metoprolol succinate (TOPROL-XL) 50 MG 24 hr tablet Take 50 mg by mouth daily.     . MULTIPLE VITAMIN PO Take by mouth daily.    Marland Kitchen spironolactone (ALDACTONE) 50 MG tablet Take 50 mg by mouth daily.    . tamoxifen (NOLVADEX) 20 MG tablet Take 20 mg by mouth daily.    . traMADol (ULTRAM) 50 MG tablet Take 1 tablet (50 mg total) by mouth every 8 (eight) hours as needed for severe pain. 20 tablet 0   No current facility-administered medications for this visit.     OBJECTIVE: Vitals:   02/18/18 1031  BP: 140/80  Pulse: 64  Temp: 98.5 F (36.9 C)     Body mass index is 25.97 kg/m.    ECOG FS:0 - Asymptomatic  General: Well-developed, well-nourished, no acute distress.  Sitting in a wheelchair. Eyes: Pink conjunctiva, anicteric sclera. HEENT: Normocephalic, moist mucous membranes. Lungs: Clear to auscultation bilaterally. Heart: Regular rate and rhythm. No rubs, murmurs, or gallops. Abdomen: Soft, nontender, nondistended. No organomegaly noted, normoactive bowel sounds. Musculoskeletal: No edema, cyanosis, or clubbing. Neuro: Alert,  answering all questions appropriately. Cranial nerves grossly intact. Skin: No rashes or petechiae noted. Psych: Normal affect.  LAB RESULTS:  Lab Results  Component Value Date   NA 131 (L) 07/22/2017   K 3.7 07/22/2017   CL 102 07/22/2017   CO2 22 07/22/2017   GLUCOSE 112 (H) 07/22/2017   BUN 12 07/22/2017   CREATININE 0.98 07/22/2017   CALCIUM 7.7 (L) 07/22/2017   PROT 5.6 (L) 07/19/2017   ALBUMIN 3.3 (L) 07/19/2017   AST 22 07/19/2017   ALT 13 07/19/2017   ALKPHOS 23 (L) 07/19/2017   BILITOT 0.6 07/19/2017   GFRNONAA 53 (L) 07/22/2017   GFRAA >60 07/22/2017    Lab Results  Component Value Date   WBC 8.0 07/22/2017   NEUTROABS 3.0 03/24/2016   HGB  10.9 (L) 07/22/2017   HCT 32.5 (L) 07/22/2017   MCV 101.2 (H) 07/22/2017   PLT 139 (L) 07/22/2017   Lab Results  Component Value Date   LABCA2 24.4 03/24/2016     STUDIES: No results found.  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  PLAN:    1. 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 Despite this, left breast mastectomy revealed significant residual disease. Patient underwent total mastectomy on December 13, 2013.  Adjuvant XRT was recommended, but patient refused. She acknowledged that this would increase her risk of recurrence but she still declined. Given patient's worsening osteoporosis, anastrozole was discontinued and patient is now on tamoxifen.  Continue tamoxifen for a minimum of 5 years completing in January 2021.  Given her high risk of recurrence, will consider extending treatment to 7 to 10 years.  Her most recent right screening  mammogram on July 14, 2017 was reported as BI-RADS 2.  Repeat in July 2020.  Return to clinic in 6 months for routine evaluation.   2. Osteoporosis: Patient's most recent bone mineral density on July 14, 2017 revealed a T score of -3.1 which is unchanged from one year prior.  Continue Fosamax, calcium, and vitamin D.  Letrozole was discontinued and patient was initiated on tamoxifen as above.  Repeat in July 2020. 3. Family history: Patient may benefit from genetic testing in the future.  I spent a total of 20 minutes face-to-face with the patient of which greater than 50% of the visit was spent in counseling and coordination of care as detailed above.  Patient expressed understanding and was in agreement with this plan. She also understands that She can call clinic at any time with any questions, concerns, or complaints.   Breast cancer   Staging form: Breast, AJCC 7th Edition     Clinical stage from 08/01/2014: Stage IIIA (T1c, N2a, M0) - Signed by Lloyd Huger, MD on 08/01/2014   Lloyd Huger, MD   02/19/2018 11:48 AM

## 2018-02-18 ENCOUNTER — Inpatient Hospital Stay: Payer: Medicare HMO | Attending: Oncology | Admitting: Oncology

## 2018-02-18 ENCOUNTER — Other Ambulatory Visit: Payer: Self-pay

## 2018-02-18 VITALS — BP 140/80 | HR 64 | Temp 98.5°F | Wt 142.0 lb

## 2018-02-18 DIAGNOSIS — Z79810 Long term (current) use of selective estrogen receptor modulators (SERMs): Secondary | ICD-10-CM | POA: Diagnosis not present

## 2018-02-18 DIAGNOSIS — C50212 Malignant neoplasm of upper-inner quadrant of left female breast: Secondary | ICD-10-CM

## 2018-02-18 DIAGNOSIS — C50912 Malignant neoplasm of unspecified site of left female breast: Secondary | ICD-10-CM | POA: Insufficient documentation

## 2018-02-18 DIAGNOSIS — Z170 Estrogen receptor positive status [ER+]: Secondary | ICD-10-CM | POA: Diagnosis not present

## 2018-02-18 DIAGNOSIS — M810 Age-related osteoporosis without current pathological fracture: Secondary | ICD-10-CM | POA: Diagnosis not present

## 2018-02-18 NOTE — Progress Notes (Signed)
Patient is here today to follow up on her primary cancer of upper inner quadrant of left breast. Patient stated she had been doing well with no complaints.

## 2018-06-27 ENCOUNTER — Encounter: Payer: Self-pay | Admitting: *Deleted

## 2018-06-27 ENCOUNTER — Other Ambulatory Visit: Payer: Self-pay

## 2018-06-27 ENCOUNTER — Emergency Department: Payer: Medicare Other

## 2018-06-27 ENCOUNTER — Inpatient Hospital Stay
Admission: EM | Admit: 2018-06-27 | Discharge: 2018-06-30 | DRG: 299 | Disposition: A | Discharge status: Home Health | Payer: Medicare Other | Attending: Internal Medicine | Admitting: Internal Medicine

## 2018-06-27 DIAGNOSIS — I2510 Atherosclerotic heart disease of native coronary artery without angina pectoris: Secondary | ICD-10-CM | POA: Diagnosis present

## 2018-06-27 DIAGNOSIS — Z820 Family history of epilepsy and other diseases of the nervous system: Secondary | ICD-10-CM

## 2018-06-27 DIAGNOSIS — Z20828 Contact with and (suspected) exposure to other viral communicable diseases: Secondary | ICD-10-CM | POA: Diagnosis present

## 2018-06-27 DIAGNOSIS — I428 Other cardiomyopathies: Secondary | ICD-10-CM | POA: Diagnosis present

## 2018-06-27 DIAGNOSIS — I248 Other forms of acute ischemic heart disease: Secondary | ICD-10-CM | POA: Diagnosis present

## 2018-06-27 DIAGNOSIS — E785 Hyperlipidemia, unspecified: Secondary | ICD-10-CM | POA: Diagnosis present

## 2018-06-27 DIAGNOSIS — Z95810 Presence of automatic (implantable) cardiac defibrillator: Secondary | ICD-10-CM | POA: Diagnosis not present

## 2018-06-27 DIAGNOSIS — Z90710 Acquired absence of both cervix and uterus: Secondary | ICD-10-CM

## 2018-06-27 DIAGNOSIS — R0602 Shortness of breath: Secondary | ICD-10-CM

## 2018-06-27 DIAGNOSIS — Z9049 Acquired absence of other specified parts of digestive tract: Secondary | ICD-10-CM | POA: Diagnosis not present

## 2018-06-27 DIAGNOSIS — I82451 Acute embolism and thrombosis of right peroneal vein: Secondary | ICD-10-CM | POA: Diagnosis present

## 2018-06-27 DIAGNOSIS — J449 Chronic obstructive pulmonary disease, unspecified: Secondary | ICD-10-CM | POA: Diagnosis present

## 2018-06-27 DIAGNOSIS — I272 Pulmonary hypertension, unspecified: Secondary | ICD-10-CM | POA: Diagnosis present

## 2018-06-27 DIAGNOSIS — Z79891 Long term (current) use of opiate analgesic: Secondary | ICD-10-CM

## 2018-06-27 DIAGNOSIS — Z79810 Long term (current) use of selective estrogen receptor modulators (SERMs): Secondary | ICD-10-CM

## 2018-06-27 DIAGNOSIS — Z9012 Acquired absence of left breast and nipple: Secondary | ICD-10-CM

## 2018-06-27 DIAGNOSIS — I5023 Acute on chronic systolic (congestive) heart failure: Secondary | ICD-10-CM | POA: Diagnosis present

## 2018-06-27 DIAGNOSIS — Z9221 Personal history of antineoplastic chemotherapy: Secondary | ICD-10-CM | POA: Diagnosis not present

## 2018-06-27 DIAGNOSIS — Z8679 Personal history of other diseases of the circulatory system: Secondary | ICD-10-CM

## 2018-06-27 DIAGNOSIS — I340 Nonrheumatic mitral (valve) insufficiency: Secondary | ICD-10-CM | POA: Diagnosis present

## 2018-06-27 DIAGNOSIS — Z87891 Personal history of nicotine dependence: Secondary | ICD-10-CM | POA: Diagnosis not present

## 2018-06-27 DIAGNOSIS — C50212 Malignant neoplasm of upper-inner quadrant of left female breast: Secondary | ICD-10-CM | POA: Diagnosis present

## 2018-06-27 DIAGNOSIS — I361 Nonrheumatic tricuspid (valve) insufficiency: Secondary | ICD-10-CM | POA: Diagnosis not present

## 2018-06-27 DIAGNOSIS — I10 Essential (primary) hypertension: Secondary | ICD-10-CM | POA: Diagnosis not present

## 2018-06-27 DIAGNOSIS — R609 Edema, unspecified: Secondary | ICD-10-CM

## 2018-06-27 DIAGNOSIS — Z85038 Personal history of other malignant neoplasm of large intestine: Secondary | ICD-10-CM | POA: Diagnosis not present

## 2018-06-27 DIAGNOSIS — I472 Ventricular tachycardia: Secondary | ICD-10-CM | POA: Diagnosis not present

## 2018-06-27 DIAGNOSIS — I82402 Acute embolism and thrombosis of unspecified deep veins of left lower extremity: Secondary | ICD-10-CM | POA: Diagnosis present

## 2018-06-27 DIAGNOSIS — I82432 Acute embolism and thrombosis of left popliteal vein: Secondary | ICD-10-CM | POA: Diagnosis not present

## 2018-06-27 DIAGNOSIS — Z8269 Family history of other diseases of the musculoskeletal system and connective tissue: Secondary | ICD-10-CM

## 2018-06-27 DIAGNOSIS — Z79899 Other long term (current) drug therapy: Secondary | ICD-10-CM

## 2018-06-27 DIAGNOSIS — M810 Age-related osteoporosis without current pathological fracture: Secondary | ICD-10-CM | POA: Diagnosis present

## 2018-06-27 DIAGNOSIS — I82459 Acute embolism and thrombosis of unspecified peroneal vein: Secondary | ICD-10-CM | POA: Diagnosis not present

## 2018-06-27 DIAGNOSIS — E876 Hypokalemia: Secondary | ICD-10-CM | POA: Diagnosis present

## 2018-06-27 DIAGNOSIS — Z7982 Long term (current) use of aspirin: Secondary | ICD-10-CM | POA: Diagnosis not present

## 2018-06-27 DIAGNOSIS — I4819 Other persistent atrial fibrillation: Secondary | ICD-10-CM | POA: Diagnosis present

## 2018-06-27 DIAGNOSIS — Z8249 Family history of ischemic heart disease and other diseases of the circulatory system: Secondary | ICD-10-CM

## 2018-06-27 DIAGNOSIS — Z923 Personal history of irradiation: Secondary | ICD-10-CM

## 2018-06-27 DIAGNOSIS — Z7901 Long term (current) use of anticoagulants: Secondary | ICD-10-CM

## 2018-06-27 DIAGNOSIS — Z833 Family history of diabetes mellitus: Secondary | ICD-10-CM

## 2018-06-27 DIAGNOSIS — I110 Hypertensive heart disease with heart failure: Secondary | ICD-10-CM | POA: Diagnosis present

## 2018-06-27 DIAGNOSIS — Z7983 Long term (current) use of bisphosphonates: Secondary | ICD-10-CM

## 2018-06-27 DIAGNOSIS — Z823 Family history of stroke: Secondary | ICD-10-CM

## 2018-06-27 HISTORY — DX: Other cardiomyopathies: I42.8

## 2018-06-27 HISTORY — DX: Other persistent atrial fibrillation: I48.19

## 2018-06-27 LAB — CBC
HCT: 40.8 % (ref 36.0–46.0)
Hemoglobin: 13.2 g/dL (ref 12.0–15.0)
MCH: 31.4 pg (ref 26.0–34.0)
MCHC: 32.4 g/dL (ref 30.0–36.0)
MCV: 96.9 fL (ref 80.0–100.0)
Platelets: 152 10*3/uL (ref 150–400)
RBC: 4.21 MIL/uL (ref 3.87–5.11)
RDW: 15.5 % (ref 11.5–15.5)
WBC: 5.4 10*3/uL (ref 4.0–10.5)
nRBC: 0 % (ref 0.0–0.2)

## 2018-06-27 LAB — COMPREHENSIVE METABOLIC PANEL
ALT: 36 U/L (ref 0–44)
AST: 37 U/L (ref 15–41)
Albumin: 3.1 g/dL — ABNORMAL LOW (ref 3.5–5.0)
Alkaline Phosphatase: 35 U/L — ABNORMAL LOW (ref 38–126)
Anion gap: 7 (ref 5–15)
BUN: 26 mg/dL — ABNORMAL HIGH (ref 8–23)
CO2: 26 mmol/L (ref 22–32)
Calcium: 8.3 mg/dL — ABNORMAL LOW (ref 8.9–10.3)
Chloride: 109 mmol/L (ref 98–111)
Creatinine, Ser: 0.97 mg/dL (ref 0.44–1.00)
GFR calc Af Amer: >60 mL/min (ref >60)
GFR calc non Af Amer: 55 mL/min — ABNORMAL LOW (ref >60)
Glucose, Bld: 103 mg/dL — ABNORMAL HIGH (ref 70–99)
Potassium: 3.4 mmol/L — ABNORMAL LOW (ref 3.5–5.1)
Sodium: 142 mmol/L (ref 135–145)
Total Bilirubin: 0.5 mg/dL (ref 0.3–1.2)
Total Protein: 5.3 g/dL — ABNORMAL LOW (ref 6.5–8.1)

## 2018-06-27 LAB — SARS CORONAVIRUS 2 BY RT PCR (HOSPITAL ORDER, PERFORMED IN ~~LOC~~ HOSPITAL LAB): SARS Coronavirus 2: NEGATIVE

## 2018-06-27 LAB — TROPONIN I: Troponin I: 0.04 ng/mL (ref <0.03)

## 2018-06-27 LAB — PROTIME-INR
INR: 1 (ref 0.8–1.2)
Prothrombin Time: 12.9 seconds (ref 11.4–15.2)

## 2018-06-27 LAB — APTT: aPTT: 28 seconds (ref 24–36)

## 2018-06-27 LAB — BRAIN NATRIURETIC PEPTIDE: B Natriuretic Peptide: 2183 pg/mL — ABNORMAL HIGH (ref 0.0–100.0)

## 2018-06-27 MED ORDER — ACETAMINOPHEN 325 MG PO TABS: 650.0000 mg | ORAL_TABLET | Freq: Four times a day (QID) | ORAL | Status: DC | PRN

## 2018-06-27 MED ORDER — LISINOPRIL 20 MG PO TABS
20.0000 mg | ORAL_TABLET | Freq: Every day | ORAL | Status: DC
  Administered 2018-06-28 – 2018-06-30 (×3): 20 mg via ORAL
  Filled 2018-06-27 (×3): qty 1

## 2018-06-27 MED ORDER — TAMOXIFEN CITRATE 10 MG PO TABS
20.0000 mg | ORAL_TABLET | Freq: Every day | ORAL | Status: DC
  Administered 2018-06-28 – 2018-06-30 (×3): 20 mg via ORAL
  Filled 2018-06-27 (×4): qty 2

## 2018-06-27 MED ORDER — PRAVASTATIN SODIUM 40 MG PO TABS: 40.0000 mg | ORAL_TABLET | Freq: Every day | ORAL | Status: DC

## 2018-06-27 MED ORDER — ONDANSETRON HCL 4 MG PO TABS: 4.0000 mg | ORAL_TABLET | Freq: Four times a day (QID) | ORAL | Status: DC | PRN

## 2018-06-27 MED ORDER — POTASSIUM CHLORIDE CRYS ER 20 MEQ PO TBCR
20.0000 meq | EXTENDED_RELEASE_TABLET | Freq: Every day | ORAL | Status: DC
  Administered 2018-06-28: 20 meq via ORAL
  Filled 2018-06-27: qty 1

## 2018-06-27 MED ORDER — ACETAMINOPHEN 650 MG RE SUPP: 650.0000 mg | Freq: Four times a day (QID) | RECTAL | Status: DC | PRN

## 2018-06-27 MED ORDER — METOPROLOL SUCCINATE ER 50 MG PO TB24
50.0000 mg | ORAL_TABLET | Freq: Every day | ORAL | Status: DC
  Administered 2018-06-28 – 2018-06-30 (×3): 50 mg via ORAL
  Filled 2018-06-27 (×3): qty 1

## 2018-06-27 MED ORDER — ISOSORBIDE MONONITRATE ER 30 MG PO TB24
30.0000 mg | ORAL_TABLET | Freq: Every day | ORAL | Status: DC
  Administered 2018-06-28 – 2018-06-30 (×3): 30 mg via ORAL
  Filled 2018-06-27 (×3): qty 1

## 2018-06-27 MED ORDER — HEPARIN (PORCINE) 25000 UT/250ML-% IV SOLN
1150.0000 [IU]/h | INTRAVENOUS | Status: DC
  Administered 2018-06-27 – 2018-06-28 (×2): 1050 [IU]/h via INTRAVENOUS
  Administered 2018-06-29: 1150 [IU]/h via INTRAVENOUS
  Filled 2018-06-27 (×2): qty 250

## 2018-06-27 MED ORDER — FUROSEMIDE 10 MG/ML IJ SOLN
20.0000 mg | Freq: Two times a day (BID) | INTRAMUSCULAR | Status: DC
  Administered 2018-06-28 – 2018-06-30 (×4): 20 mg via INTRAVENOUS
  Filled 2018-06-27 (×4): qty 2

## 2018-06-27 MED ORDER — ONDANSETRON HCL 4 MG/2ML IJ SOLN: 4.0000 mg | Freq: Four times a day (QID) | INTRAMUSCULAR | Status: DC | PRN

## 2018-06-27 MED ORDER — IOHEXOL 350 MG/ML SOLN
75.0000 mL | Freq: Once | INTRAVENOUS | Status: AC | PRN
  Administered 2018-06-27: 75 mL via INTRAVENOUS

## 2018-06-27 MED ORDER — POLYETHYLENE GLYCOL 3350 17 G PO PACK: 17.0000 g | PACK | Freq: Every day | ORAL | Status: DC | PRN

## 2018-06-27 MED ORDER — FUROSEMIDE 10 MG/ML IJ SOLN
20.0000 mg | Freq: Once | INTRAMUSCULAR | Status: AC
  Administered 2018-06-27: 20 mg via INTRAVENOUS
  Filled 2018-06-27: qty 4

## 2018-06-27 MED ORDER — POTASSIUM CHLORIDE 20 MEQ PO PACK
20.0000 meq | PACK | Freq: Once | ORAL | Status: AC
  Administered 2018-06-28: 20 meq via ORAL
  Filled 2018-06-27: qty 1

## 2018-06-27 MED ORDER — HEPARIN BOLUS VIA INFUSION
4000.0000 [IU] | Freq: Once | INTRAVENOUS | Status: AC
  Administered 2018-06-27: 4000 [IU] via INTRAVENOUS
  Filled 2018-06-27: qty 4000

## 2018-06-27 NOTE — ED Notes (Signed)
Admitting MD at bedside.

## 2018-06-27 NOTE — ED Triage Notes (Signed)
First Nurse Note:  C/O SOB and left leg swelling and cool to touch since Friday.  Patient is AAOx3.  No SOB/ DOE noted.  NAD

## 2018-06-27 NOTE — ED Provider Notes (Signed)
-----------------------------------------   9:28 PM on 06/27/2018 -----------------------------------------  Patient with an acute DVT, also has a BNP of over 2000 and exertional and positional dyspnea, chest x-ray was reassuring sever cardiomegaly CT scan did not show PE fortunately however I do believe that she should likely be diuresed even though we are not yet seeing significant edema in her chest x-ray clinically it is likely the cause of her dyspnea.  Also we will start her on anticoagulation for her DVT, patient will be admitted for both of these pathologies and further evaluation.   Schuyler Amor, MD 06/27/18 2129

## 2018-06-27 NOTE — H&P (Signed)
[facility] NAME: Julia Gordon    MR#:  [id number]  DATE OF BIRTH:  [birth year]  DATE OF ADMISSION:  06/27/2018  PRIMARY CARE PHYSICIAN: Ellamae Sia, MD   REQUESTING/REFERRING PHYSICIAN: Ashok Cordia, PA  CHIEF COMPLAINT:   Chief Complaint  Patient presents with   Leg Swelling    HISTORY OF PRESENT ILLNESS:  Julia Gordon  is a 84 y.o. female with a known history of atrial fibrillation, COPD, hypertension, hyperlipidemia, pacemaker placement, breast cancer status post chemotherapy, history of nonischemic cardiomyopathy.  She presented to the emergency room complaining of left lower extremity edema and increased tenderness for 3 days.  She is short of breath with exertion such as walking less than 10 feet on a flat surface or getting dressed.  She denies cough.  She denies chest pain.  However, patient notes shortness of breath is worse when lying flat.  She denies fevers, chills, nausea, vomiting, diarrhea.  She denies abdominal pain.  BNP is 2183 on arrival with troponin of 0.04.  Potassium is 3.4.  CTA chest completed with no evidence of pulmonary embolism.  However left lower extremity ultrasound demonstrates DVT with occlusion at the popliteal and calf.  Patient was started on heparin infusion in the emergency room and this has been continued.  We have admitted her to the hospitalist service for further management.  PAST MEDICAL HISTORY:   Past Medical History:  Diagnosis Date   Anemia    Atrial fibrillation ([place])    history of   Breast cancer ([place]) 2015   LT MASTECTOMY   Colon adenocarcinoma ([facility])    COPD (chronic obstructive pulmonary disease) ([facility])    Depression    DJD (degenerative joint disease) of knee    right knee   H/O hysterectomy with oophorectomy    for DUB   History of depression    HLD (hyperlipidemia)    HTN (hypertension)    LV (left ventricular) mural thrombus    history of, resolved  (echo 04/09)   Other primary cardiomyopathies    Pacemaker 05/2007   [facility] Scientific ICD, Guidant lead, [facility] Dr Isaias Sakai   Personal history of chemotherapy 2015   BREAST CA    PAST SURGICAL HISTORY:   Past Surgical History:  Procedure Laterality Date   ABDOMINAL HYSTERECTOMY     BREAST BIOPSY Left 05/10/13   [place] bx-positive   COLONOSCOPY WITH PROPOFOL N/A 01/15/2017   Procedure: COLONOSCOPY WITH PROPOFOL;  Surgeon: Jonathon Bellows, MD;  Location: [facility] ENDOSCOPY;  Service: Gastroenterology;  Laterality: N/A;   LAPAROSCOPIC RIGHT COLON RESECTION  08/10   Dr Pat Patrick, for adenoca    MASTECTOMY Left 2015   BREAST CA   OOPHORECTOMY     ORIF FEMUR FRACTURE Right 07/21/2017   Procedure: OPEN REDUCTION INTERNAL FIXATION (ORIF) DISTAL FEMUR FRACTURE;  Surgeon: Lovell Sheehan, MD;  Location: [facility] ORS;  Service: Orthopedics;  Laterality: Right;  femur    PACEMAKER INSERTION  2009   REPLACEMENT TOTAL KNEE  1990's   right     SOCIAL HISTORY:   Social History   Tobacco Use   Smoking status: Former Smoker   Smokeless tobacco: Never Used   Tobacco comment: quit 2006  Substance Use Topics   Alcohol use: No    Comment: former    FAMILY HISTORY:   Family History  Problem Relation Age of Onset   Hypertension Father    Alzheimer's disease Mother  Breast cancer Sister 60   Diabetes Other        brothers   Stroke Brother    Heart failure Brother    Lupus Sister     DRUG ALLERGIES:   Allergies  Allergen Reactions   No Known Allergies     REVIEW OF SYSTEMS:   Review of Systems  Constitutional: Negative for chills, fever and malaise/fatigue.  HENT: Negative for congestion, sinus pain and sore throat.   Eyes: Negative for blurred vision, double vision and pain.  Respiratory: Positive for shortness of breath. Negative for cough, sputum production and wheezing.   Cardiovascular: Positive for leg swelling (left > right). Negative for chest pain and  palpitations.  Gastrointestinal: Negative for abdominal pain, constipation, diarrhea, heartburn, nausea and vomiting.  Genitourinary: Negative for dysuria, flank pain, frequency and hematuria.  Musculoskeletal: Negative for falls and joint pain.       Left lower extremity pain and edema/erythema  Neurological: Negative for dizziness, loss of consciousness and headaches.  Psychiatric/Behavioral: Negative.  Negative for depression.     MEDICATIONS AT HOME:   Prior to Admission medications   Medication Sig Start Date End Date Taking? Authorizing Provider  alendronate (FOSAMAX) 70 MG tablet TAKE 1 TAB WEEKLY 30 MINS BEFORE BREAKFAST WITH A FULL GLASS OF WATER. DO NOT LIE DOWN FOR 30 MINS 10/25/17  Yes Lloyd Huger, MD  aspirin 81 MG tablet Take 81 mg by mouth daily.   Yes [provider]  furosemide (LASIX) 40 MG tablet Take 0.5 tablets (20 mg total) by mouth daily as needed. For swelling of legs or weight gain >5 lbs than your baseline 07/23/17  Yes Fritzi Mandes, MD  ibuprofen (ADVIL,MOTRIN) 200 MG tablet Take 200 mg by mouth every 6 (six) hours as needed for mild pain or moderate pain.    Yes [provider]  isosorbide mononitrate (IMDUR) 30 MG 24 hr tablet Take 1 tablet by mouth Daily. 01/31/11  Yes [provider]  KLOR-CON M20 20 MEQ tablet Take 1 tablet by mouth Daily. 03/03/11  Yes [provider]  lisinopril (PRINIVIL,ZESTRIL) 20 MG tablet Take 20 mg by mouth daily.   Yes [provider]  lovastatin (MEVACOR) 40 MG tablet Take 2 tablets by mouth Daily. 01/31/11  Yes [provider]  metoprolol succinate (TOPROL-XL) 50 MG 24 hr tablet Take 50 mg by mouth daily.  07/11/14  Yes [provider]  MULTIPLE VITAMIN PO Take 1 tablet by mouth daily.    Yes [provider]  spironolactone (ALDACTONE) 50 MG tablet Take 50 mg by mouth daily.   Yes [provider]  tamoxifen (NOLVADEX) 20 MG tablet Take 20 mg by mouth  daily.   Yes [provider]  traMADol (ULTRAM) 50 MG tablet Take 1 tablet (50 mg total) by mouth every 8 (eight) hours as needed for severe pain. 07/23/17  Yes Fritzi Mandes, MD  enoxaparin (LOVENOX) 40 MG/0.4ML injection Inject 0.4 mLs (40 mg total) into the skin daily. Patient not taking: Reported on 06/27/2018 07/23/17   Fritzi Mandes, MD      VITAL SIGNS:  Blood pressure (!) 140/94, pulse (!) 25, temperature 98.8 F (37.1 C), temperature source Oral, resp. rate 16, height 5\' 2"  (1.575 m), weight 72.6 kg, SpO2 92 %.  PHYSICAL EXAMINATION:  Physical Exam Vitals signs and nursing note reviewed.  Constitutional:      General: She is not in acute distress.    Appearance: She is not ill-appearing.  HENT:  Head: Normocephalic.     Right Ear: External ear normal.     Left Ear: External ear normal.     Mouth/Throat:     Mouth: Mucous membranes are moist.     Pharynx: Oropharynx is clear.  Eyes:     General: No scleral icterus.    Extraocular Movements: Extraocular movements intact.     Conjunctiva/sclera: Conjunctivae normal.     Pupils: Pupils are equal, round, and reactive to light.  Neck:     Musculoskeletal: Normal range of motion and neck supple.  Cardiovascular:     Rate and Rhythm: Normal rate and regular rhythm.     Pulses: Normal pulses.     Heart sounds: Normal heart sounds. No murmur. No friction rub. No gallop.   Pulmonary:     Breath sounds: Normal breath sounds. No stridor. No wheezing or rales (Bilateral lower lobe).  Abdominal:     General: Bowel sounds are normal. There is no distension.     Palpations: Abdomen is soft.     Tenderness: There is no guarding or rebound.  Musculoskeletal: Normal range of motion.        General: Swelling and tenderness present.     Right lower leg: Edema present.     Left lower leg: Edema (LEft lower ext edema and erythema with tenderness) present.  Skin:    General: Skin is warm and dry.     Capillary Refill: Capillary  refill takes less than 2 seconds.     Coloration: Skin is not jaundiced.     Findings: No rash.  Neurological:     General: No focal deficit present.     Mental Status: She is alert and oriented to person, place, and time.     Cranial Nerves: No cranial nerve deficit.     Sensory: No sensory deficit.     Motor: No weakness.  Psychiatric:        Mood and Affect: Mood normal.        Behavior: Behavior normal.       LABORATORY PANEL:   CBC Recent Labs  Lab 06/27/18 1931  WBC 5.4  HGB 13.2  HCT 40.8  PLT 152   ------------------------------------------------------------------------------------------------------------------  Chemistries  Recent Labs  Lab 06/27/18 1931  NA 142  K 3.4*  CL 109  CO2 26  GLUCOSE 103*  BUN 26*  CREATININE 0.97  CALCIUM 8.3*  AST 37  ALT 36  ALKPHOS 35*  BILITOT 0.5   ------------------------------------------------------------------------------------------------------------------  Cardiac Enzymes Recent Labs  Lab 06/27/18 1931  TROPONINI 0.04*   ------------------------------------------------------------------------------------------------------------------  RADIOLOGY:  Dg Chest 2 View  Result Date: 06/27/2018 CLINICAL DATA:  84 year old female with acute shortness of breath EXAM: CHEST - 2 VIEW COMPARISON:  07/19/2017 and prior radiographs FINDINGS: Cardiomegaly, LEFT ICD and RIGHT IJ Port-A-Cath with tip overlying the SUPERIOR cavoatrial junction again noted. There is no evidence of focal airspace disease, pulmonary edema, suspicious pulmonary nodule/mass, pleural effusion, or pneumothorax. No acute bony abnormalities are identified. IMPRESSION: Cardiomegaly without evidence of acute cardiopulmonary disease. Electronically Signed   By: Margarette Canada M.D.   On: 06/27/2018 19:22   Ct Angio Chest Pe W And/or Wo Contrast  Result Date: 06/27/2018 CLINICAL DATA:  84 year old female with DVT and shortness of breath. EXAM: CT  ANGIOGRAPHY CHEST WITH CONTRAST TECHNIQUE: Multidetector CT imaging of the chest was performed using the standard protocol during bolus administration of intravenous contrast. Multiplanar CT image reconstructions and MIPs were obtained to evaluate the vascular anatomy.  CONTRAST:  45mL OMNIPAQUE IOHEXOL 350 MG/ML SOLN COMPARISON:  Chest radiograph dated 06/27/2018 FINDINGS: Cardiovascular: There is moderate cardiomegaly. No pericardial effusion. Advanced multi vessel coronary vascular calcification as well as calcification of the aortic root. Left pectoral pacemaker device is noted. There is moderate atherosclerotic calcification of the thoracic aorta. Evaluation of the aorta is limited due to non opacification and timing of the contrast. Curvilinear low density extending from the main pulmonary trunk into the right main pulmonary artery most consistent with mixing artifact. There is no CT evidence of pulmonary embolism. Right pectoral Port-A-Cath with tip close to the cavoatrial junction. Mediastinum/Nodes: No hilar or mediastinal adenopathy. Esophagus is grossly unremarkable. No mediastinal fluid collection. Lungs/Pleura: Trace bilateral pleural effusions. No pneumothorax. There is moderate centrilobular emphysema. No focal consolidation. The central airways are patent. Upper Abdomen: There is retrograde flow of contrast from the right atrium into the IVC consistent with right heart dysfunction. The visualized upper abdomen is otherwise unremarkable. Musculoskeletal: No chest wall abnormality. No acute or significant osseous findings. Review of the MIP images confirms the above findings. IMPRESSION: 1. No CT evidence of pulmonary embolism. 2. Moderate cardiomegaly with advanced multi vessel coronary vascular calcification. 3. Trace bilateral pleural effusions. 4. Aortic Atherosclerosis (ICD10-I70.0) and Emphysema (ICD10-J43.9). Electronically Signed   By: Anner Crete M.D.   On: 06/27/2018 20:55   US Venous  Img Lower Unilateral Left  Result Date: 06/27/2018 CLINICAL DATA:  84 year old female with LEFT LOWER extremity pain and swelling for 2 days. EXAM: LEFT LOWER EXTREMITY VENOUS DOPPLER ULTRASOUND TECHNIQUE: Gray-scale sonography with graded compression, as well as color Doppler and duplex ultrasound were performed to evaluate the lower extremity deep venous systems from the level of the common femoral vein and including the common femoral, femoral, profunda femoral, popliteal and calf veins including the posterior tibial, peroneal and gastrocnemius veins when visible. The superficial great saphenous vein was also interrogated. Spectral Doppler was utilized to evaluate flow at rest and with distal augmentation maneuvers in the common femoral, femoral and popliteal veins. COMPARISON:  None. FINDINGS: Occlusive DVT is noted within the LEFT popliteal and calf veins. No DVT is identified within the LEFT common femoral, profundus femoral or femoral veins. IMPRESSION: Occlusive LEFT popliteal and calf DVT. Critical Value/emergent results were called by telephone at the time of interpretation on 06/27/2018 at 7:25 pm to Dr. Lenise Arena , who verbally acknowledged these results. Electronically Signed   By: Margarette Canada M.D.   On: 06/27/2018 19:27      IMPRESSION AND PLAN:   1.  Left lower extremity DVT - Heparin infusion initiated in the emergency room and continued - CTA chest was negative for pulmonary embolism  2. acute on chronic diastolic CHF - Diuresis with Lasix 20 mg IV twice daily -Echocardiogram - Trend troponin levels -Repeat EKG in the a.m. -Cardiology consulted for further evaluation and recommendations -Beta-blocker restarted  3.  Elevated troponin - Continue to trend troponin levels and repeat EKG in the a.m.  This is felt likely to be a result of demand ischemia with exacerbation of CHF  4. hypertension - Lisinopril, metoprolol, and Imdur continued -We will treat persistent  hypertension expectantly  5.  History of breast cancer -Tamoxifen continued  6.  Hypokalemia-potassium 3.4.  Patient received p.o. potassium replacement -We will repeat BMP in the a.m.  DVT and PPI prophylaxis initiated    All the records are reviewed and case discussed with ED provider. The plan of care was discussed in details with  the patient (and family). I answered all questions. The patient agreed to proceed with the above mentioned plan. Further management will depend upon hospital course.   CODE STATUS: Full code  TOTAL TIME TAKING CARE OF THIS PATIENT: 45 minutes.    [place] on 06/27/2018 at 11:19 PM  Pager - [phone]  After 6pm go to [email or web address] - Proofreader  [facility]  Office  [phone]  CC: Primary care physician; Ellamae Sia, MD   Note: This dictation was prepared with Dragon dictation along with smaller phrase technology. Any transcriptional errors that result from this process are unintentional.

## 2018-06-27 NOTE — ED Notes (Signed)
Pt gave RN a belongings bag and told RN to deliver it to her daughter in the lobby. Bag has been delivered to pts daughter and pt called family to confirm all belongings were in the bag.

## 2018-06-27 NOTE — ED Notes (Addendum)
This RN called pts son Ersilia Brawley

## 2018-06-27 NOTE — ED Notes (Signed)
Provider at bedside

## 2018-06-27 NOTE — Consult Note (Signed)
ANTICOAGULATION CONSULT NOTE - Initial Consult  Pharmacy Consult for Heparin Indication: VTE treatment  Allergies  Allergen Reactions  . No Known Allergies     Patient Measurements: Height: 5\' 2"  (157.5 cm) Weight: 160 lb (72.6 kg) IBW/kg (Calculated) : 50.1 Heparin Dosing Weight: 65.6kg  Vital Signs: Temp: 98.8 F (37.1 C) (06/21 1823) Temp Source: Oral (06/21 1823) BP: 130/89 (06/21 2030) Pulse Rate: 64 (06/21 2030)  Labs: Recent Labs    06/27/18 1931  HGB 13.2  HCT 40.8  PLT 152  APTT 28  LABPROT 12.9  INR 1.0  CREATININE 0.97  TROPONINI 0.04*    Estimated Creatinine Clearance: 42.4 mL/min (by C-G formula based on SCr of 0.97 mg/dL).   Medical History: Past Medical History:  Diagnosis Date  . Anemia   . Atrial fibrillation ([place])    history of  . Breast cancer ([place]) 2015   LT MASTECTOMY  . Colon adenocarcinoma ([place])   . COPD (chronic obstructive pulmonary disease) ([place])   . Depression   . DJD (degenerative joint disease) of knee    right knee  . H/O hysterectomy with oophorectomy    for DUB  . History of depression   . HLD (hyperlipidemia)   . HTN (hypertension)   . LV (left ventricular) mural thrombus    history of, resolved (echo 04/09)  . Other primary cardiomyopathies   . Pacemaker 05/2007   [employer] ICD, Guidant lead, [facility] Dr Isaias Sakai  . Personal history of chemotherapy 2015   BREAST CA    Medications:  No PTA anticoagulant  Assessment: Patient with an acute DVT - pharmacy has been consulted for heparin drip  Goal of Therapy:  Heparin level 0.3-0.7 units/ml Monitor platelets by anticoagulation protocol: Yes   Plan:  Give 4000 units bolus x 1, followed by 1050 units/hr  Will check heparin level (HL) in 8 hours per protocol  Lu Duffel, PharmD, BCPS Clinical Pharmacist 06/27/2018 9:42 PM

## 2018-06-27 NOTE — ED Notes (Signed)
Patient transported to CT 

## 2018-06-27 NOTE — ED Triage Notes (Addendum)
Left leg swelling since Friday with tenderness upon assessment. No known injury. Do discoloration noted. Sensation intact.   Pt also reporting increased SOB over the past week. No chest pain. Hx of CHF.

## 2018-06-27 NOTE — ED Provider Notes (Signed)
[facility] Emergency Department Provider Note  ____________________________________________   First MD Initiated Contact with Patient 06/27/18 1928     (approximate)  I have reviewed the triage vital signs and the nursing notes.   HISTORY  Chief Complaint Leg Swelling    HPI Julia Gordon is a 84 y.o. female presents emergency department complaining of left leg swelling and tenderness since Friday.  She states originally it became swollen but now it is become painful.  She denies any fever or chills.  No chest pain but does have some shortness of breath.  She is not sure if this is her congestive heart failure if it is because her leg hurts.  She does have a history of breast cancer has been in remission for 10 to 15 years.    Past Medical History:  Diagnosis Date  . Anemia   . Atrial fibrillation ([place])    history of  . Breast cancer ([place]) 2015   LT MASTECTOMY  . 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 COPD (chronic obstructive pulmonary disease) ([place])   . Depression   . DJD (degenerative joint disease) of knee    right knee  . H/O hysterectomy with oophorectomy    for DUB  . History of depression   . HLD (hyperlipidemia)   . HTN (hypertension)   . LV (left ventricular) mural thrombus    history of, resolved (echo 04/09)  . Other primary cardiomyopathies   . Pacemaker 05/2007   [employer] ICD, [employer] lead, [facility] Dr Isaias Sakai  . Personal history of chemotherapy 2015   BREAST CA    Patient Active Problem List   Diagnosis Date Noted  . Age-related osteoporosis with current pathological fracture with routine healing 07/24/2017  . Breast cancer ([place]) 07/24/2017  . Femur fracture ([place]) 07/20/2017  . HTN (hypertension) 07/20/2017  . PAF (paroxysmal atrial fibrillation) ([place]) 07/20/2017  . Primary cancer of upper inner quadrant of left female breast ([place]) 07/17/2014  . Chronic systolic congestive heart failure ([place]) 03/24/2011  . ICD  (implantable cardiac defibrillator) in place 03/24/2011  . Dyslipidemia 03/24/2011  . Other primary cardiomyopathies     Past Surgical History:  Procedure Laterality Date  . ABDOMINAL HYSTERECTOMY    . BREAST BIOPSY Left 05/10/13   [place] bx-positive  . COLONOSCOPY WITH PROPOFOL N/A 01/15/2017   Procedure: COLONOSCOPY WITH PROPOFOL;  Surgeon: Jonathon Bellows, MD;  Location: [facility] ENDOSCOPY;  Service: Gastroenterology;  Laterality: N/A;  . LAPAROSCOPIC RIGHT COLON RESECTION  08/10   Dr Pat Patrick, for adenoca   . MASTECTOMY Left 2015   BREAST CA  . OOPHORECTOMY    . ORIF FEMUR FRACTURE Right 07/21/2017   Procedure: OPEN REDUCTION INTERNAL FIXATION (ORIF) DISTAL FEMUR FRACTURE;  Surgeon: Lovell Sheehan, MD;  Location: [facility] ORS;  Service: Orthopedics;  Laterality: Right;  femur   . PACEMAKER INSERTION  2009  . REPLACEMENT TOTAL KNEE  1990's   right     Prior to Admission medications   Medication Sig Start Date End Date Taking? Authorizing Provider  alendronate (FOSAMAX) 70 MG tablet TAKE 1 TAB WEEKLY 30 MINS BEFORE BREAKFAST WITH A FULL GLASS OF WATER. DO NOT LIE DOWN FOR 30 MINS 10/25/17   Lloyd Huger, MD  aspirin 81 MG tablet Take 81 mg by mouth daily.    [provider]  enoxaparin (LOVENOX) 40 MG/0.4ML injection Inject 0.4 mLs (40 mg total) into the skin daily. 07/23/17   Fritzi Mandes, MD  furosemide (LASIX)  40 MG tablet Take 0.5 tablets (20 mg total) by mouth daily as needed. For swelling of legs or weight gain >5 lbs than your baseline 07/23/17   Fritzi Mandes, MD  ibuprofen (ADVIL,MOTRIN) 200 MG tablet Take 200 mg by mouth every 6 (six) hours as needed for mild pain or moderate pain.     [provider]  isosorbide mononitrate (IMDUR) 30 MG 24 hr tablet Take 1 tablet by mouth Daily. 01/31/11   [provider]  KLOR-CON M20 20 MEQ tablet Take 1 tablet by mouth Daily. 03/03/11   [provider]  lisinopril (PRINIVIL,ZESTRIL) 20 MG tablet Take 20 mg by mouth  daily.    [provider]  lovastatin (MEVACOR) 40 MG tablet Take 2 tablets by mouth Daily. 01/31/11   [provider]  metoprolol succinate (TOPROL-XL) 50 MG 24 hr tablet Take 50 mg by mouth daily.  07/11/14   [provider]  MULTIPLE VITAMIN PO Take by mouth daily.    [provider]  spironolactone (ALDACTONE) 50 MG tablet Take 50 mg by mouth daily.    [provider]  tamoxifen (NOLVADEX) 20 MG tablet Take 20 mg by mouth daily.    [provider]  traMADol (ULTRAM) 50 MG tablet Take 1 tablet (50 mg total) by mouth every 8 (eight) hours as needed for severe pain. 07/23/17   Fritzi Mandes, MD    Allergies No known allergies  Family History  Problem Relation Age of Onset  . Hypertension Father   . Alzheimer's disease Mother   . Breast cancer Sister 65  . Diabetes Other        brothers  . Stroke Brother   . Heart failure Brother   . Lupus Sister     Social History Social History   Tobacco Use  . Smoking status: Former Research scientist (life sciences)  . Smokeless tobacco: Never Used  . Tobacco comment: quit 2006  Substance Use Topics  . Alcohol use: No    Comment: former  . Drug use: No    Review of Systems  Constitutional: No fever/chills Eyes: No visual changes. ENT: No sore throat. Respiratory: Denies cough, positive shortness of breath Cardiovascular: Positive for chest pain Genitourinary: Negative for dysuria. Musculoskeletal: Negative for back pain.  Positive for left leg pain and swelling Skin: Negative for rash.    ____________________________________________   PHYSICAL EXAM:  VITAL SIGNS: ED Triage Vitals  Enc Vitals Group     BP 06/27/18 1823 (!) 128/107     Pulse Rate 06/27/18 1823 82     Resp 06/27/18 1823 16     Temp 06/27/18 1823 98.8 F (37.1 C)     Temp Source 06/27/18 1823 Oral     SpO2 06/27/18 1823 97 %     Weight 06/27/18 1824 160 lb (72.6 kg)     Height 06/27/18 1824 5' 2" (1.575 m)     Head Circumference  --      Peak Flow --      Pain Score 06/27/18 1823 3     Pain Loc --      Pain Edu? --      Excl. in [place]? --     Constitutional: Alert and oriented. Well appearing and in no acute distress. Eyes: Conjunctivae are normal.  Head: Atraumatic. Nose: No congestion/rhinnorhea. Mouth/Throat: Mucous membranes are moist.   Neck:  supple no lymphadenopathy noted Cardiovascular: Normal rate, regular rhythm.  Respiratory: Normal respiratory effort.  No retractions, lungs c t a  Abd: soft nontender bs normal all 4 quad GU: deferred Musculoskeletal: Left leg is swollen when compared to the right, skin appears tight, calf is tender, neurovascular is intact  neurologic:  Normal speech and language.  Skin:  Skin is warm, dry and intact. No rash noted. Psychiatric: Mood and affect are normal. Speech and behavior are normal.  ____________________________________________   LABS (all labs ordered are listed, but only abnormal results are displayed)  Labs Reviewed  COMPREHENSIVE METABOLIC PANEL - Abnormal; Notable for the following components:      Result Value   Potassium 3.4 (*)    Glucose, Bld 103 (*)    BUN 26 (*)    Calcium 8.3 (*)    Total Protein 5.3 (*)    Albumin 3.1 (*)    Alkaline Phosphatase 35 (*)    GFR calc non Af Amer 55 (*)    All other components within normal limits  BRAIN NATRIURETIC PEPTIDE - Abnormal; Notable for the following components:   B Natriuretic Peptide 2,183.0 (*)    All other components within normal limits  CBC  TROPONIN I   ____________________________________________   ____________________________________________  RADIOLOGY  Chest x-ray is normal Ultrasound left leg shows a DVT, occlusive at the popliteal and calf CT for PE with and without contrast ____________________________________________   PROCEDURES  Procedure(s) performed: No  Procedures    ____________________________________________   INITIAL IMPRESSION / ASSESSMENT AND PLAN  / ED COURSE  Pertinent labs & imaging results that were available during my care of the patient were reviewed by me and considered in my medical decision making (see chart for details).   Patient is 84 year old female presents emergency department with complaints of left leg swelling and tenderness.  Some shortness of breath.  History of CHF.  History of breast CA 10 to 15 years ago.  Physical exam shows patient appears to be well, vitals appear to be normal, left lower extremity is tender and swollen, lungs appear to be clear to all station.  Chest x-ray is normal Ultrasound left lower leg shows an occlusive DVT in the popliteal and calf  ----------------------------------------- 8:31 PM on 06/27/2018 -----------------------------------------  Due to the DVT and the patient shortness of breath CTA for PE was ordered.   CBC is normal, comprehensive metabolic panel has decreased potassium at 3.4, increased BUN at 26, calcium total protein and albumin are also decreased, alk phos is decreased, BNP is 2183 which is elevated.  Troponin is pending  Discussed case with Dr. Burlene Arnt.  He will be accepting care of the patient at this time.  We anticipate discharge unless her CT of the chest shows a large amount of edema or PE.  As part of my medical decision making, I reviewed the following data within the [place] notes reviewed and incorporated, Labs reviewed see above,, Old chart reviewed, Patient signed out to Dr. Burlene Arnt, Radiograph reviewed chest x-ray normal, ultrasound left lower leg shows DVT of popliteal vein and calf, Evaluated by EM attending Dr. Burlene Arnt, Notes from prior ED visits and [place] Controlled Substance Database  ____________________________________________   FINAL CLINICAL IMPRESSION(S) / ED DIAGNOSES  Final diagnoses:  Acute deep vein thrombosis (DVT) of left lower extremity, unspecified vein (HCC)  Shortness of breath      NEW MEDICATIONS  STARTED DURING THIS VISIT:  New Prescriptions   No medications on file     Note:  This document was prepared using Dragon voice recognition software and may include unintentional dictation  errors.    Versie Starks, PA-C 06/27/18 2035    Schuyler Amor, MD 06/27/18 [phone]

## 2018-06-28 ENCOUNTER — Encounter: Payer: Self-pay | Admitting: Physician Assistant

## 2018-06-28 ENCOUNTER — Inpatient Hospital Stay: Payer: Medicare Other

## 2018-06-28 ENCOUNTER — Inpatient Hospital Stay (HOSPITAL_COMMUNITY)
Admit: 2018-06-28 | Discharge: 2018-06-28 | Disposition: A | Discharge status: Home / Self Care | Payer: Medicare Other | Attending: Nurse Practitioner | Admitting: Nurse Practitioner

## 2018-06-28 ENCOUNTER — Telehealth: Payer: Self-pay | Admitting: Family

## 2018-06-28 DIAGNOSIS — E785 Hyperlipidemia, unspecified: Secondary | ICD-10-CM

## 2018-06-28 DIAGNOSIS — I340 Nonrheumatic mitral (valve) insufficiency: Secondary | ICD-10-CM

## 2018-06-28 DIAGNOSIS — Z87891 Personal history of nicotine dependence: Secondary | ICD-10-CM

## 2018-06-28 DIAGNOSIS — I5023 Acute on chronic systolic (congestive) heart failure: Secondary | ICD-10-CM

## 2018-06-28 DIAGNOSIS — I82459 Acute embolism and thrombosis of unspecified peroneal vein: Secondary | ICD-10-CM

## 2018-06-28 DIAGNOSIS — I361 Nonrheumatic tricuspid (valve) insufficiency: Secondary | ICD-10-CM

## 2018-06-28 DIAGNOSIS — Z7982 Long term (current) use of aspirin: Secondary | ICD-10-CM

## 2018-06-28 DIAGNOSIS — Z79899 Other long term (current) drug therapy: Secondary | ICD-10-CM

## 2018-06-28 DIAGNOSIS — I10 Essential (primary) hypertension: Secondary | ICD-10-CM

## 2018-06-28 LAB — BASIC METABOLIC PANEL
Anion gap: 10 (ref 5–15)
BUN: 23 mg/dL (ref 8–23)
CO2: 22 mmol/L (ref 22–32)
Calcium: 8.5 mg/dL — ABNORMAL LOW (ref 8.9–10.3)
Chloride: 109 mmol/L (ref 98–111)
Creatinine, Ser: 0.84 mg/dL (ref 0.44–1.00)
GFR calc Af Amer: >60 mL/min (ref >60)
GFR calc non Af Amer: >60 mL/min (ref >60)
Glucose, Bld: 107 mg/dL — ABNORMAL HIGH (ref 70–99)
Potassium: 3.3 mmol/L — ABNORMAL LOW (ref 3.5–5.1)
Sodium: 141 mmol/L (ref 135–145)

## 2018-06-28 LAB — CBC
HCT: 43.4 % (ref 36.0–46.0)
Hemoglobin: 14.1 g/dL (ref 12.0–15.0)
MCH: 30.9 pg (ref 26.0–34.0)
MCHC: 32.5 g/dL (ref 30.0–36.0)
MCV: 95 fL (ref 80.0–100.0)
Platelets: 145 10*3/uL — ABNORMAL LOW (ref 150–400)
RBC: 4.57 MIL/uL (ref 3.87–5.11)
RDW: 15.5 % (ref 11.5–15.5)
WBC: 5.6 10*3/uL (ref 4.0–10.5)
nRBC: 0 % (ref 0.0–0.2)

## 2018-06-28 LAB — HEPARIN LEVEL (UNFRACTIONATED)
Heparin Unfractionated: 0.18 IU/mL — ABNORMAL LOW (ref 0.30–0.70)
Heparin Unfractionated: <0.1 IU/mL — ABNORMAL LOW (ref 0.30–0.70)
Heparin Unfractionated: 0.23 IU/mL — ABNORMAL LOW (ref 0.30–0.70)

## 2018-06-28 LAB — ECHOCARDIOGRAM COMPLETE
Height: 62 in
Weight: 2560 oz

## 2018-06-28 LAB — TSH: TSH: 2.291 u[IU]/mL (ref 0.350–4.500)

## 2018-06-28 LAB — TROPONIN I
Troponin I: 0.04 ng/mL (ref <0.03)
Troponin I: 0.04 ng/mL (ref <0.03)

## 2018-06-28 LAB — MAGNESIUM: Magnesium: 2.1 mg/dL (ref 1.7–2.4)

## 2018-06-28 MED ORDER — SODIUM CHLORIDE 0.9% FLUSH
10.0000 mL | Freq: Two times a day (BID) | INTRAVENOUS | Status: DC
  Administered 2018-06-29 – 2018-06-30 (×3): 10 mL

## 2018-06-28 MED ORDER — HYDRALAZINE HCL 20 MG/ML IJ SOLN: 10.0000 mg | INTRAMUSCULAR | Status: DC | PRN

## 2018-06-28 MED ORDER — SPIRONOLACTONE 25 MG PO TABS
12.5000 mg | ORAL_TABLET | Freq: Every day | ORAL | Status: DC
  Administered 2018-06-28 – 2018-06-29 (×2): 12.5 mg via ORAL
  Filled 2018-06-28 (×2): qty 0.5
  Filled 2018-06-28 (×2): qty 1

## 2018-06-28 MED ORDER — ATORVASTATIN CALCIUM 20 MG PO TABS
40.0000 mg | ORAL_TABLET | Freq: Every day | ORAL | Status: DC
  Administered 2018-06-28 – 2018-06-29 (×2): 40 mg via ORAL
  Filled 2018-06-28 (×2): qty 2

## 2018-06-28 MED ORDER — SODIUM CHLORIDE 0.9% FLUSH: 10.0000 mL | INTRAVENOUS | Status: DC | PRN

## 2018-06-28 MED ORDER — PANTOPRAZOLE SODIUM 40 MG IV SOLR
40.0000 mg | INTRAVENOUS | Status: DC
  Administered 2018-06-28: 40 mg via INTRAVENOUS
  Filled 2018-06-28: qty 40

## 2018-06-28 MED ORDER — PANTOPRAZOLE SODIUM 40 MG PO TBEC
40.0000 mg | DELAYED_RELEASE_TABLET | Freq: Every day | ORAL | Status: DC
  Administered 2018-06-29 – 2018-06-30 (×2): 40 mg via ORAL
  Filled 2018-06-28 (×2): qty 1

## 2018-06-28 NOTE — Consult Note (Addendum)
ANTICOAGULATION CONSULT NOTE - Initial Consult  Pharmacy Consult for Heparin Indication: VTE treatment  Allergies  Allergen Reactions  . No Known Allergies     Patient Measurements: Height: 5\' 2"  (157.5 cm) Weight: 165 lb 9.6 oz (75.1 kg) IBW/kg (Calculated) : 50.1 Heparin Dosing Weight: 65.6kg  Vital Signs: Temp: 97.6 F (36.4 C) (06/22 1644) Temp Source: Oral (06/22 1644) BP: 126/85 (06/22 1644) Pulse Rate: 66 (06/22 1644)  Labs: Recent Labs    06/27/18 1931 06/28/18 0657 06/28/18 1607  HGB 13.2 14.1  --   HCT 40.8 43.4  --   PLT 152 145*  --   APTT 28  --   --   LABPROT 12.9  --   --   INR 1.0  --   --   HEPARINUNFRC  --  0.18* <0.10*  CREATININE 0.97 0.84  --   TROPONINI 0.04* 0.04* 0.04*    Estimated Creatinine Clearance: 49.8 mL/min (by C-G formula based on SCr of 0.84 mg/dL).   Medical History: Past Medical History:  Diagnosis Date  . Anemia   . Breast cancer ([place]) 2015   a. L mastectomy with chemoradiation  . 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 COPD (chronic obstructive pulmonary disease) ([place])   . Depression   . DJD (degenerative joint disease) of knee    right knee  . H/O hysterectomy with oophorectomy    for DUB  . History of depression   . HLD (hyperlipidemia)   . HTN (hypertension)   . LV (left ventricular) mural thrombus    history of, resolved (echo 04/09)  . NICM (nonischemic cardiomyopathy) ([place])    a. status post [place] with [employer] lead in 2009 at [facility], Dr. Boyd Kerbs  . Persistent atrial fibrillation    a. noted in Care Everywhere from 2008-2009; b. CHADS2VASc 6 (CHF, HTN, age x 2, vascular disease, female)    Medications:  No PTA anticoagulant  Assessment: Patient with an acute DVT - pharmacy has been consulted for heparin drip  Patient's drip was stopped at approximately 0520 this morning due to scant amount of bleeding noted. Level was drawn@0657 , over an hour and a half after stop.  6/22@0657 : HL 0.18  subtherapeutic  6/22@1709 : HL <0.10 subtherapeutic. It was stopped 10 am to~2 pm today, per [employer]. Confirmed heparin has been running for ~3.5 hours.   Goal of Therapy:  Heparin level 0.3-0.7 units/ml Monitor platelets by anticoagulation protocol: Yes   Plan:  Continue current rate of 1050 units/hr.  Given the patient has only been on heparin for ~ 3.5 hours, will need to recheck heparin in 8 hours.  Will check heparin level (HL) in 8 hours from 2 pm, per protocol  Rowland Lathe, PharmD Clinical Pharmacist 06/28/2018 5:10 PM

## 2018-06-28 NOTE — Progress Notes (Signed)
*  PRELIMINARY RESULTS* Echocardiogram 2D Echocardiogram has been performed.  Sherrie Sport 06/28/2018, 1:20 PM

## 2018-06-28 NOTE — Consult Note (Addendum)
Cardiology Consultation:   Patient ID: Julia Gordon; [id number]; 24-Jul-1934   Admit date: 06/27/2018 Date of Consult: 06/28/2018  Primary Care Provider: Ellamae Sia, MD Primary Cardiologist: [facility] (last evaluated in 2009) Primary Electrophysiologist:  Previously seen by Dr. Lovena Le in 2014   Patient Profile:   Julia Gordon is a 84 y.o. female with a hx of HFrEF secondary to longstanding NICM s/p [employer] ICD in 2009 through [place], LV mural thrombus in 2009, persistent Afib not on [place], left-sided breast cancer status post mastectomy and chemoradiation, HTN, and HLD who is being seen today for the evaluation of CHF at the request of Ms. Seals, NP  History of Present Illness:   Ms. Pitkin was previously followed by Dr. Clayborn Bigness as well as [facility] cardiology and underwent [facility] Scientific ICD implantation in 2009. Care Everywhere indicates she was in sinus rhythm in 2007 with subsequent development of Afib in 2008 through 2009. She was subsequently lost to follow up with Dr. Clayborn Bigness and at [facility]. She was last seen by Dr. Lovena Le with [place] in 2014. Last device interrogation on file was from this visit in 2014. She denies any prior generator replacement. She has been lost to follow up since.   Patient presented to [facility] on 6/21 with a 3-week history of left lower extremity swelling, erythema, warmth, and pain. She had also noted some mild increase in her baseline SOB. No chest pain, dizziness, presyncope, or syncope. She reported stable mile orthopnea with a couple pieces of wood underneath the headboard of her bed.   Upon the patient's arrival to [facility] they were found to have BP in the 150s/110s. There is an isolated documented HR of 25 from the ED without further details otherwise HR has been in the 70s to 80s bpm, temp afebrile, oxygen saturation 100% on room air, weight 72.6 kg documented in the ED. EKG showed sinus rhythm as below, CXR showed cardiomegaly without acute  cardiopulmonary disease. Lower extremity ultrasound positive for occlusive left lower extremity DVT. CTA chest negative for PE with moderate cardiomegaly and multivessel coronary artery calcification, trace bilateral pleural effusions, aortic atherosclerosis, and COPD. Labs showed troponin 0.04 x 2, BNP 2183, HGB 13.2, K+ 3.4-->3.3, SCr 0.97-->0.84, albumin 3.1, AST/ALT normal, COVID-19 negative. She was started on IV heparin gtt in the ED given her occlusive DVT. Upon admission, she was started on IV Lasix 20 mg bid and cardiology was asked to see. She tells cardiology this morning she presented to the hospital for a 3 week history of left lower extremity swelling, warmth, and pain with mild increase in SOB. No chest pain, worsening orthopnea, PND, dizziness, presyncope, or syncope. Documented UOP of 800 mL to date. Currently without SOB or chest pain.   Past Medical History:  Diagnosis Date   Anemia    Breast cancer ([place]) 2015   a. L mastectomy with chemoradiation   Colon adenocarcinoma (HCC)    COPD (chronic obstructive pulmonary disease) (HCC)    Depression    DJD (degenerative joint disease) of knee    right knee   H/O hysterectomy with oophorectomy    for DUB   History of depression    HLD (hyperlipidemia)    HTN (hypertension)    LV (left ventricular) mural thrombus    history of, resolved (echo 04/09)   NICM (nonischemic cardiomyopathy) ([place])    a. status post [place] with [employer] lead in 2009 at [facility], Dr. Boyd Kerbs   Persistent atrial  fibrillation    a. noted in [place] from 2008-2009; b. CHADS2VASc 6 (CHF, HTN, age x 2, vascular disease, female)    Past Surgical History:  Procedure Laterality Date   ABDOMINAL HYSTERECTOMY     BREAST BIOPSY Left 05/10/13   [place] bx-positive   COLONOSCOPY WITH PROPOFOL N/A 01/15/2017   Procedure: COLONOSCOPY WITH PROPOFOL;  Surgeon: Jonathon Bellows, MD;  Location: [facility] ENDOSCOPY;  Service: Gastroenterology;  Laterality:  N/A;   LAPAROSCOPIC RIGHT COLON RESECTION  08/10   Dr Pat Patrick, for adenoca    MASTECTOMY Left 2015   BREAST CA   OOPHORECTOMY     ORIF FEMUR FRACTURE Right 07/21/2017   Procedure: OPEN REDUCTION INTERNAL FIXATION (ORIF) DISTAL FEMUR FRACTURE;  Surgeon: Lovell Sheehan, MD;  Location: [facility] ORS;  Service: Orthopedics;  Laterality: Right;  femur    PACEMAKER INSERTION  2009   REPLACEMENT TOTAL KNEE  1990's   right      Home Meds: Prior to Admission medications   Medication Sig Start Date End Date Taking? Authorizing Provider  alendronate (FOSAMAX) 70 MG tablet TAKE 1 TAB WEEKLY 30 MINS BEFORE BREAKFAST WITH A FULL GLASS OF WATER. DO NOT LIE DOWN FOR 30 MINS 10/25/17  Yes Lloyd Huger, MD  aspirin 81 MG tablet Take 81 mg by mouth daily.   Yes [provider]  furosemide (LASIX) 40 MG tablet Take 0.5 tablets (20 mg total) by mouth daily as needed. For swelling of legs or weight gain >5 lbs than your baseline 07/23/17  Yes Fritzi Mandes, MD  ibuprofen (ADVIL,MOTRIN) 200 MG tablet Take 200 mg by mouth every 6 (six) hours as needed for mild pain or moderate pain.    Yes [provider]  isosorbide mononitrate (IMDUR) 30 MG 24 hr tablet Take 1 tablet by mouth Daily. 01/31/11  Yes [provider]  KLOR-CON M20 20 MEQ tablet Take 1 tablet by mouth Daily. 03/03/11  Yes [provider]  lisinopril (PRINIVIL,ZESTRIL) 20 MG tablet Take 20 mg by mouth daily.   Yes [provider]  lovastatin (MEVACOR) 40 MG tablet Take 2 tablets by mouth Daily. 01/31/11  Yes [provider]  metoprolol succinate (TOPROL-XL) 50 MG 24 hr tablet Take 50 mg by mouth daily.  07/11/14  Yes [provider]  MULTIPLE VITAMIN PO Take 1 tablet by mouth daily.    Yes [provider]  spironolactone (ALDACTONE) 50 MG tablet Take 50 mg by mouth daily.   Yes [provider]  tamoxifen (NOLVADEX) 20 MG tablet Take 20 mg by mouth daily.   Yes [provider]  traMADol (ULTRAM) 50 MG tablet Take 1 tablet (50 mg total) by mouth every 8 (eight) hours as needed for severe pain. 07/23/17  Yes Fritzi Mandes, MD  enoxaparin (LOVENOX) 40 MG/0.4ML injection Inject 0.4 mLs (40 mg total) into the skin daily. Patient not taking: Reported on 06/27/2018 07/23/17   Fritzi Mandes, MD    Inpatient Medications: Scheduled Meds:  furosemide  20 mg Intravenous Q12H   isosorbide mononitrate  30 mg Oral Daily   lisinopril  20 mg Oral Daily   metoprolol succinate  50 mg Oral Daily   pantoprazole (PROTONIX) IV  40 mg Intravenous Q24H   potassium chloride SA  20 mEq Oral Daily   pravastatin  40 mg Oral q1800   tamoxifen  20 mg Oral Daily   Continuous Infusions:  heparin 1,050 Units/hr (06/28/18 1009)   PRN Meds: acetaminophen **OR** acetaminophen, ondansetron **OR**  ondansetron (ZOFRAN) IV, polyethylene glycol  Allergies:   Allergies  Allergen Reactions   No Known Allergies     Social History:   Social History   Socioeconomic History   Marital status: Married    Spouse name: Not on file   Number of children: Not on file   Years of education: Not on file   Highest education level: Not on file  Occupational History   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[employer], 35 years  Social Designer, fashion/clothing strain: Not on file   Food insecurity    Worry: Not on file    Inability: Not on Lexicographer needs    Medical: Not on file    Non-medical: Not on file  Tobacco Use   Smoking status: Former Smoker   Smokeless tobacco: Never Used   Tobacco comment: quit 2006  Substance and Sexual Activity   Alcohol use: No    Comment: former   Drug use: No   Sexual activity: Not on file  Lifestyle   Physical activity    Days per week: Not on file    Minutes per session: Not on file   Stress: Not on file  Relationships   Social connections    Talks on phone: Not on  file    Gets together: Not on file    Attends religious service: Not on file    Active member of club or organization: Not on file    Attends meetings of clubs or organizations: Not on file    Relationship status: Not on file   Intimate partner violence    Fear of current or ex partner: Not on file    Emotionally abused: Not on file    Physically abused: Not on file    Forced sexual activity: Not on file  Other Topics Concern   Not on file  Social History Narrative   Not on file     Family History:   Family History  Problem Relation Age of Onset   Hypertension Father    Alzheimer's disease Mother    Breast cancer Sister 41   Diabetes Other        brothers   Stroke Brother    Heart failure Brother    Lupus Sister     ROS:  Review of Systems  Constitutional: Positive for malaise/fatigue. Negative for chills, diaphoresis, fever and weight loss.  HENT: Negative for congestion.   Eyes: Negative for discharge and redness.  Respiratory: Positive for shortness of breath. Negative for cough, hemoptysis, sputum production and wheezing.   Cardiovascular: Positive for leg swelling. Negative for chest pain, palpitations, orthopnea, claudication and PND.       Left lower extremity swelling, warmth, and erythema x 3 weeks  Gastrointestinal: Negative for abdominal pain, blood in stool, heartburn, melena, nausea and vomiting.  Genitourinary: Negative for hematuria.  Musculoskeletal: Negative for falls and myalgias.  Skin: Negative for rash.  Neurological: Positive for weakness. Negative for dizziness, tingling, tremors, sensory change, speech change, focal weakness and loss of consciousness.  Endo/Heme/Allergies: Does not bruise/bleed easily.  Psychiatric/Behavioral: Negative for substance abuse. The patient is not nervous/anxious.   All other systems reviewed and are negative.     Physical Exam/Data:   Vitals:   06/28/18 0808 06/28/18 0809 06/28/18 0810 06/28/18 0905    BP:  (!) 154/118  (!) 157/116  Pulse: 81 79 80 86  Resp:  17  20  Temp:    97.8 F (36.6 C)  TempSrc:    Oral  SpO2: 100% 100% 100% 100%  Weight:      Height:        Intake/Output Summary (Last 24 hours) at 06/28/2018 1015 Last data filed at 06/28/2018 1011 Gross per 24 hour  Intake 240 ml  Output 800 ml  Net -560 ml   Filed Weights   06/27/18 1824  Weight: 72.6 kg   Body mass index is 29.26 kg/m.   Physical Exam: General: Well developed, well nourished, in no acute distress. Head: Normocephalic, atraumatic, sclera non-icteric, no xanthomas, nares without discharge.  Neck: Negative for carotid bruits. JVD not elevated. Lungs: Clear bilaterally to auscultation without wheezes, rales, or rhonchi. Breathing is unlabored. Heart: RRR with S1 S2. I/VI systolic murmur RUSB, no rubs, or gallops appreciated. Abdomen: Soft, non-tender, non-distended with normoactive bowel sounds. No hepatomegaly. No rebound/guarding. No obvious abdominal masses. Msk:  Strength and tone appear normal for age. Extremities: No clubbing or cyanosis. Swelling, warmth, and mild erythema of the left lower extremity noted. Distal pedal pulses are 2+ and equal bilaterally. Neuro: Alert and oriented X 3. No facial asymmetry. No focal deficit. Moves all extremities spontaneously. Psych:  Responds to questions appropriately with a normal affect.   EKG:  The EKG was personally reviewed and demonstrates: NSR, 77 bpm, rare PVC, LVH, nonspecific lateral st/t changes Telemetry:  Telemetry was personally reviewed and demonstrates: SR with rare PVC  Weights: [employer]   06/27/18 1824  Weight: 72.6 kg    Relevant CV Studies: None on file for review   Laboratory Data:  Chemistry Recent Labs  Lab 06/27/18 1931 06/28/18 0657  NA 142 141  K 3.4* 3.3*  CL 109 109  CO2 26 22  GLUCOSE 103* 107*  BUN 26* 23  CREATININE 0.97 0.84  CALCIUM 8.3* 8.5*  GFRNONAA 55* >60  GFRAA >60 >60  ANIONGAP 7 10     Recent Labs  Lab 06/27/18 1931  PROT 5.3*  ALBUMIN 3.1*  AST 37  ALT 36  ALKPHOS 35*  BILITOT 0.5   Hematology Recent Labs  Lab 06/27/18 1931 06/28/18 0657  WBC 5.4 5.6  RBC 4.21 4.57  HGB 13.2 14.1  HCT 40.8 43.4  MCV 96.9 95.0  MCH 31.4 30.9  MCHC 32.4 32.5  RDW 15.5 15.5  PLT 152 145*   Cardiac Enzymes Recent Labs  Lab 06/27/18 1931 06/28/18 0657  TROPONINI 0.04* 0.04*   No results for input(s): TROPIPOC in the last 168 hours.  BNP Recent Labs  Lab 06/27/18 1931  BNP 2,183.0*    DDimer No results for input(s): DDIMER in the last 168 hours.  Radiology/Studies:  Dg Chest 2 View  Result Date: 06/27/2018 IMPRESSION: Cardiomegaly without evidence of acute cardiopulmonary disease. Electronically Signed   By: Margarette Canada M.D.   On: 06/27/2018 19:22   Ct Angio Chest Pe W And/or Wo Contrast  Result Date: 06/27/2018 IMPRESSION: 1. No CT evidence of pulmonary embolism. 2. Moderate cardiomegaly with advanced multi vessel coronary vascular calcification. 3. Trace bilateral pleural effusions. 4. Aortic Atherosclerosis (ICD10-I70.0) and Emphysema (ICD10-J43.9). Electronically Signed   By: Anner Crete M.D.   On: 06/27/2018 20:55   US Venous Img Lower Unilateral Left  Result Date: 06/27/2018 IMPRESSION: Occlusive LEFT popliteal and calf DVT. Critical Value/emergent results were called by telephone at the time of interpretation on 06/27/2018 at 7:25 pm to Dr. Lenise Arena , who verbally acknowledged these results.  Electronically Signed   By: Margarette Canada M.D.   On: 06/27/2018 19:27    Assessment and Plan:   1. HFrEF secondary to presumed NICM s/p [employer] ICD: -BNP elevated at 2183 as above with trace bilateral pleural effusions noted on CTA chest -Gentle diuresis with IV Lasix, can likely transition to PO within the 24-36 hours -Continue Toprol XL and lisinopril  -Add spironolactone 12.5 mg daily -Check echo to evaluate EF and for potential need for  generator replacement, no prior EF on file for review -If her EF is < 35%, in outpatient follow up, consider transition from ACEi to [facility] following 36 hour washout  -With regards to her ICD, she has been lost to follow up since 2014, we have contacted [employer] to interrogate device. It is quite possible her device may well have been end of life for some time now -She will need to re-establish with EP upon discharge   2. Multivessel coronary artery calcification with mildly elevated troponin: -No chest pain -Troponin minimally elevated and flat trending, not consistent with ACS -Check echo -Start Lipitor as below -Toprol XL, Imdur  -Outpatient follow up for discussion of ischemic evaluation   3. Persistent Afib: -EKG reads from 2008-2009 in [place] indicate Afib -Currently in sinus rhythm with PVCs -She will be placed on [place] as below, per IM, given her DVT -Follow up with EP -Replete potassium to goal of 4.0 -Check magnesium and TSH -Toprol XL  4. Left lower extremity DVT: -Uncertain if this was provoked or not -Defer hypercoagulable workup to IM -Remains on heparin gtt per IM  5. HTN: -Blood pressure is suboptimally controlled -Diuresis as above -Add spironolactone  -Continue lisinopril, Toprol, Imdur  6. HLD: -Check lipid panel -Start Lipitor 40 mg daily given multivessel coronary artery calcification noted on CTA chest  7. Hypokalemia: -Add spironolactone as above   For questions or updates, please contact [place] Please consult [email or web address] for contact info under Cardiology/STEMI.   Signed, Christell Faith, PA-C [facility] Pager: [phone] 06/28/2018, 10:15 AM

## 2018-06-28 NOTE — Consult Note (Signed)
[facility] Vascular Consult Note  MRN : [id number]  Julia Gordon is a 84 y.o. (1934/02/14) female who presents with chief complaint of  Chief Complaint  Patient presents with  . Leg Swelling   History of Present Illness:  The patient is an 84 year old female with a past medical history of persistent atrial fibrillation, nonischemic cardiomyopathy, pretension, hyperlipidemia, depression, degenerative joint disease, COPD, history of colon and breast cancer, anemia, acute on chronic congestive systolic heart failure, implantable cardiac defibrillator in place who presented to the [facility] emergency department progressively worsening left lower extremity pain and swelling x3 days.  Patient endorses a past medical history progressively worsening left lower extremity pain and swelling x3 days. Denies any trauma or recent surgery, prolonged immobility and/or bleeding/clotting disorder.  Patient denies any history of DVT in the past.  CTA of the chest was negative for PE.   06/28/18: Venous duplex - Right peroneal vein DVT.  Patient was admitted to the hospital and started on heparin.  Patient notes progressively worsening discomfort with ambulation otherwise, her left lower extremity discomfort has improved since initiation of heparin.  The patient does not like to elevate her lower extremity however we had a long discussion and I encouraged and explained the need for this.  Patient denies any fever, nausea vomiting.  Vascular surgery was consulted by Dr. Jerelyn Charles for further recommendations. Current Facility-Administered Medications  Medication Dose Route Frequency Provider Last Rate Last Dose  . acetaminophen (TYLENOL) tablet 650 mg  650 mg Oral Q6H PRN Seals, Theo Dills, NP       Or  . acetaminophen (TYLENOL) suppository 650 mg  650 mg Rectal Q6H PRN Seals, Theo Dills, NP      . atorvastatin (LIPITOR) tablet 40 mg  40 mg Oral q1800 Rise Mu, PA-C       . furosemide (LASIX) injection 20 mg  20 mg Intravenous Q12H Seals, Angela H, NP   20 mg at 06/28/18 1012  . heparin ADULT infusion 100 units/mL (25000 units/256mL sodium chloride 0.45%)  1,050 Units/hr Intravenous Continuous Lu Duffel, RPH 10.5 mL/hr at 06/28/18 1009 1,050 Units/hr at 06/28/18 1009  . hydrALAZINE (APRESOLINE) injection 10 mg  10 mg Intravenous Q4H PRN Salary, Montell D, MD      . isosorbide mononitrate (IMDUR) 24 hr tablet 30 mg  30 mg Oral Daily Seals, Angela H, NP   30 mg at 06/28/18 1006  . lisinopril (ZESTRIL) tablet 20 mg  20 mg Oral Daily Seals, Angela H, NP   20 mg at 06/28/18 1006  . metoprolol succinate (TOPROL-XL) 24 hr tablet 50 mg  50 mg Oral Daily Seals, Angela H, NP   50 mg at 06/28/18 1006  . ondansetron (ZOFRAN) tablet 4 mg  4 mg Oral Q6H PRN Seals, Theo Dills, NP       Or  . ondansetron (ZOFRAN) injection 4 mg  4 mg Intravenous Q6H PRN Seals, Angela H, NP      . pantoprazole (PROTONIX) injection 40 mg  40 mg Intravenous Q24H Seals, Angela H, NP   40 mg at 06/28/18 0805  . polyethylene glycol (MIRALAX / GLYCOLAX) packet 17 g  17 g Oral Daily PRN Seals, Levada Dy H, NP      . spironolactone (ALDACTONE) tablet 12.5 mg  12.5 mg Oral Daily Dunn, Ryan M, PA-C      . tamoxifen (NOLVADEX) tablet 20 mg  20 mg Oral Daily Seals, Theo Dills, NP  Past Medical History:  Diagnosis Date  . Anemia   . Breast cancer ([place]) 2015   a. L mastectomy with chemoradiation  . Colon adenocarcinoma ([place])   . COPD (chronic obstructive pulmonary disease) ([place])   . Depression   . DJD (degenerative joint disease) of knee    right knee  . H/O hysterectomy with oophorectomy    for DUB  . History of depression   . HLD (hyperlipidemia)   . HTN (hypertension)   . LV (left ventricular) mural thrombus    history of, resolved (echo 04/09)  . NICM (nonischemic cardiomyopathy) ([place])    a. status post [place] with [employer] lead in 2009 at [facility], Dr. Boyd Kerbs  .  Persistent atrial fibrillation    a. noted in Care Everywhere from 2008-2009; b. CHADS2VASc 6 (CHF, HTN, age x 2, vascular disease, female)   Past Surgical History:  Procedure Laterality Date  . ABDOMINAL HYSTERECTOMY    . BREAST BIOPSY Left 05/10/13   [place] bx-positive  . COLONOSCOPY WITH PROPOFOL N/A 01/15/2017   Procedure: COLONOSCOPY WITH PROPOFOL;  Surgeon: Jonathon Bellows, MD;  Location: [facility];  Service: Gastroenterology;  Laterality: N/A;  . LAPAROSCOPIC RIGHT COLON RESECTION  08/10   Dr Pat Patrick, for adenoca   . MASTECTOMY Left 2015   BREAST CA  . OOPHORECTOMY    . ORIF FEMUR FRACTURE Right 07/21/2017   Procedure: OPEN REDUCTION INTERNAL FIXATION (ORIF) DISTAL FEMUR FRACTURE;  Surgeon: Lovell Sheehan, MD;  Location: [facility] ORS;  Service: Orthopedics;  Laterality: Right;  femur   . PACEMAKER INSERTION  2009  . REPLACEMENT TOTAL KNEE  1990's   right    Social History Social History   Tobacco Use  . Smoking status: Former Research scientist (life sciences)  . Smokeless tobacco: Never Used  . Tobacco comment: quit 2006  Substance Use Topics  . Alcohol use: No    Comment: former  . Drug use: No   Family History Family History  Problem Relation Age of Onset  . Hypertension Father   . Alzheimer's disease Mother   . Breast cancer Sister 73  . Diabetes Other        brothers  . Stroke Brother   . Heart failure Brother   . Lupus Sister   Denies family history of peripheral artery disease, renal disease and/or bleeding/clotting disorder.  Allergies  Allergen Reactions  . No Known Allergies    REVIEW OF SYSTEMS (Negative unless checked)  Constitutional: [] Weight loss  [] Fever  [] Chills Cardiac: [] Chest pain   [] Chest pressure   [] Palpitations   [] Shortness of breath when laying flat   [] Shortness of breath at rest   [] Shortness of breath with exertion. Vascular:  [x] Pain in legs with walking   [] Pain in legs at rest   [] Pain in legs when laying flat   [] Claudication   [x] Pain in feet when walking   [] Pain in feet at rest  [] Pain in feet when laying flat   [] History of DVT   [] Phlebitis   [x] Swelling in legs   [] Varicose veins   [] Non-healing ulcers Pulmonary:   [] Uses home oxygen   [] Productive cough   [] Hemoptysis   [] Wheeze  [] COPD   [] Asthma Neurologic:  [] Dizziness  [] Blackouts   [] Seizures   [] History of stroke   [] History of TIA  [] Aphasia   [] Temporary blindness   [] Dysphagia   [] Weakness or numbness in arms   [] Weakness or numbness in legs Musculoskeletal:  [] Arthritis   [] Joint swelling   [] Joint pain   []   Low back pain Hematologic:  [] Easy bruising  [] Easy bleeding   [] Hypercoagulable state   [x] Anemic  [] Hepatitis Gastrointestinal:  [] Blood in stool   [] Vomiting blood  [] Gastroesophageal reflux/heartburn   [] Difficulty swallowing. Genitourinary:  [] Chronic kidney disease   [] Difficult urination  [] Frequent urination  [] Burning with urination   [] Blood in urine Skin:  [] Rashes   [] Ulcers   [] Wounds Psychological:  [] History of anxiety   []  History of major depression.  Physical Examination  Vitals:   06/28/18 0809 06/28/18 0810 06/28/18 0905 06/28/18 0912  BP: (!) 154/118  (!) 157/116   Pulse: 79 80 86   Resp: 17  20   Temp:   97.8 F (36.6 C)   TempSrc:   Oral   SpO2: 100% 100% 100%   Weight:    75.1 kg  Height:    5\' 2"  (1.575 m)   Body mass index is 30.29 kg/m. Gen:  WD/WN, NAD Head: [place]/AT, No temporalis wasting. Prominent temp pulse not noted. Ear/Nose/Throat: Hearing grossly intact, nares w/o erythema or drainage, oropharynx w/o Erythema/Exudate Eyes: Sclera non-icteric, conjunctiva clear Neck: Trachea midline.  No JVD.  Pulmonary:  Good air movement, respirations not labored, equal bilaterally.  Cardiac: Irregularly irregular Vascular:  Vessel Right Left  Radial Palpable Palpable  Ulnar Palpable Palpable  Brachial Palpable Palpable  Carotid Palpable, without bruit Palpable, without bruit  Aorta Not palpable N/A  Femoral Palpable Palpable  Popliteal  Palpable Palpable  PT Palpable Non-Palpable  DP Palpable Non-Palpable   Left Lower Extremity: Thigh soft. Calf soft. Moderate calf edema.  Hard to palpate pedal pulses due to edema.  There is a good capillary refill.  Motor/sensory is intact.  Gastrointestinal: soft, non-tender/non-distended. No guarding/reflex.  Musculoskeletal: M/S 5/5 throughout.  Extremities without ischemic changes.  No deformity or atrophy.  Neurologic: Sensation grossly intact in extremities.  Symmetrical.  Speech is fluent. Motor exam as listed above. Psychiatric: Judgment intact, Mood & affect appropriate for pt's clinical situation. Dermatologic: No rashes or ulcers noted.  No cellulitis or open wounds. Lymph : No Cervical, Axillary, or Inguinal lymphadenopathy.  CBC Lab Results  Component Value Date   WBC 5.6 06/28/2018   HGB 14.1 06/28/2018   HCT 43.4 06/28/2018   MCV 95.0 06/28/2018   PLT 145 (L) 06/28/2018   BMET    Component Value Date/Time   NA 141 06/28/2018 0657   NA 136 12/13/2013 0623   K 3.3 (L) 06/28/2018 0657   K 3.8 12/13/2013 0623   CL 109 06/28/2018 0657   CL 104 12/13/2013 0623   CO2 22 06/28/2018 0657   CO2 28 12/13/2013 0623   GLUCOSE 107 (H) 06/28/2018 0657   GLUCOSE 100 (H) 12/13/2013 0623   BUN 23 06/28/2018 0657   BUN 16 12/13/2013 0623   CREATININE 0.84 06/28/2018 0657   CREATININE 1.01 12/13/2013 0623   CALCIUM 8.5 (L) 06/28/2018 0657   CALCIUM 9.7 12/13/2013 0623   GFRNONAA >60 06/28/2018 0657   GFRNONAA 56 (L) 12/13/2013 0623   GFRNONAA 58 (L) 09/27/2013 1332   GFRAA >60 06/28/2018 0657   GFRAA >60 12/13/2013 0623   GFRAA >60 09/27/2013 1332   Estimated Creatinine Clearance: 49.8 mL/min (by C-G formula based on SCr of 0.84 mg/dL).  COAG Lab Results  Component Value Date   INR 1.0 06/27/2018   Radiology Dg Chest 2 View  Result Date: 06/27/2018 CLINICAL DATA:  84 year old female with acute shortness of breath EXAM: CHEST - 2 VIEW COMPARISON:  07/19/2017 and  prior  radiographs FINDINGS: Cardiomegaly, LEFT ICD and RIGHT IJ Port-A-Cath with tip overlying the SUPERIOR cavoatrial junction again noted. There is no evidence of focal airspace disease, pulmonary edema, suspicious pulmonary nodule/mass, pleural effusion, or pneumothorax. No acute bony abnormalities are identified. IMPRESSION: Cardiomegaly without evidence of acute cardiopulmonary disease. Electronically Signed   By: Margarette Canada M.D.   On: 06/27/2018 19:22   Ct Angio Chest Pe W And/or Wo Contrast  Result Date: 06/27/2018 CLINICAL DATA:  84 year old female with DVT and shortness of breath. EXAM: CT ANGIOGRAPHY CHEST WITH CONTRAST TECHNIQUE: Multidetector CT imaging of the chest was performed using the standard protocol during bolus administration of intravenous contrast. Multiplanar CT image reconstructions and MIPs were obtained to evaluate the vascular anatomy. CONTRAST:  16mL OMNIPAQUE IOHEXOL 350 MG/ML SOLN COMPARISON:  Chest radiograph dated 06/27/2018 FINDINGS: Cardiovascular: There is moderate cardiomegaly. No pericardial effusion. Advanced multi vessel coronary vascular calcification as well as calcification of the aortic root. Left pectoral pacemaker device is noted. There is moderate atherosclerotic calcification of the thoracic aorta. Evaluation of the aorta is limited due to non opacification and timing of the contrast. Curvilinear low density extending from the main pulmonary trunk into the right main pulmonary artery most consistent with mixing artifact. There is no CT evidence of pulmonary embolism. Right pectoral Port-A-Cath with tip close to the cavoatrial junction. Mediastinum/Nodes: No hilar or mediastinal adenopathy. Esophagus is grossly unremarkable. No mediastinal fluid collection. Lungs/Pleura: Trace bilateral pleural effusions. No pneumothorax. There is moderate centrilobular emphysema. No focal consolidation. The central airways are patent. Upper Abdomen: There is retrograde flow of  contrast from the right atrium into the IVC consistent with right heart dysfunction. The visualized upper abdomen is otherwise unremarkable. Musculoskeletal: No chest wall abnormality. No acute or significant osseous findings. Review of the MIP images confirms the above findings. IMPRESSION: 1. No CT evidence of pulmonary embolism. 2. Moderate cardiomegaly with advanced multi vessel coronary vascular calcification. 3. Trace bilateral pleural effusions. 4. Aortic Atherosclerosis (ICD10-I70.0) and Emphysema (ICD10-J43.9). Electronically Signed   By: Anner Crete M.D.   On: 06/27/2018 20:55   US Venous Img Lower Unilateral Left  Result Date: 06/27/2018 CLINICAL DATA:  84 year old female with LEFT LOWER extremity pain and swelling for 2 days. EXAM: LEFT LOWER EXTREMITY VENOUS DOPPLER ULTRASOUND TECHNIQUE: Gray-scale sonography with graded compression, as well as color Doppler and duplex ultrasound were performed to evaluate the lower extremity deep venous systems from the level of the common femoral vein and including the common femoral, femoral, profunda femoral, popliteal and calf veins including the posterior tibial, peroneal and gastrocnemius veins when visible. The superficial great saphenous vein was also interrogated. Spectral Doppler was utilized to evaluate flow at rest and with distal augmentation maneuvers in the common femoral, femoral and popliteal veins. COMPARISON:  None. FINDINGS: Occlusive DVT is noted within the LEFT popliteal and calf veins. No DVT is identified within the LEFT common femoral, profundus femoral or femoral veins. IMPRESSION: Occlusive LEFT popliteal and calf DVT. Critical Value/emergent results were called by telephone at the time of interpretation on 06/27/2018 at 7:25 pm to Dr. Lenise Arena , who verbally acknowledged these results. Electronically Signed   By: Margarette Canada M.D.   On: 06/27/2018 19:27   US Venous Img Lower Unilateral Right  Result Date:  06/28/2018 CLINICAL DATA:  Right lower extremity pain and edema. Imaging yesterday demonstrated left popliteal and calf vein DVT. EXAM: RIGHT LOWER EXTREMITY VENOUS DOPPLER ULTRASOUND TECHNIQUE: Gray-scale sonography with graded compression, as well as color  Doppler and duplex ultrasound were performed to evaluate the lower extremity deep venous systems from the level of the common femoral vein and including the common femoral, femoral, profunda femoral, popliteal and calf veins including the posterior tibial, peroneal and gastrocnemius veins when visible. The superficial great saphenous vein was also interrogated. Spectral Doppler was utilized to evaluate flow at rest and with distal augmentation maneuvers in the common femoral, femoral and popliteal veins. COMPARISON:  Left lower extremity venous duplex ultrasound on 06/27/2018 FINDINGS: Contralateral Common Femoral Vein: Respiratory phasicity is normal and symmetric with the symptomatic side. No evidence of thrombus. Normal compressibility. Common Femoral Vein: No evidence of thrombus. Normal compressibility, respiratory phasicity and response to augmentation. Saphenofemoral Junction: No evidence of thrombus. Normal compressibility and flow on color Doppler imaging. Profunda Femoral Vein: No evidence of thrombus. Normal compressibility and flow on color Doppler imaging. Femoral Vein: No evidence of thrombus. Normal compressibility, respiratory phasicity and response to augmentation. Popliteal Vein: No evidence of thrombus. Normal compressibility, respiratory phasicity and response to augmentation. Calf Veins: There is thrombus isolated to the right peroneal vein. Other visualized calf veins are normally patent. Superficial Great Saphenous Vein: No evidence of thrombus. Normal compressibility. Venous Reflux:  None. Other Findings: No evidence of superficial thrombophlebitis or abnormal fluid collection. IMPRESSION: Right peroneal vein DVT. Electronically Signed    By: Aletta Edouard M.D.   On: 06/28/2018 12:22   Assessment/Plan The patient is an 84 year old female with a past medical history of persistent atrial fibrillation, nonischemic cardiomyopathy, pretension, hyperlipidemia, depression, degenerative joint disease, COPD, history of colon and breast cancer, anemia, acute on chronic congestive systolic heart failure, implantable cardiac defibrillator in place who presented to the [facility] emergency department progressively worsening left lower extremity pain and swelling x3 days. 1. Left Lower Extremity DVT: DVT to the peroneal vein.  Due to the patient's DVT being in 1 of her tibial veins there is no role for lysis at this time.  Would transition to Eliquis when medically stable.  Encourage compression socks and elevation.  We will be happy to see as an outpatient to continue monitoring. 2. Hyperlipidemia: On aspirin and statin for medical management. Encouraged good control as its slows the progression of atherosclerotic disease. 3. Hypertension: Encouraged good control as its slows the progression of atherosclerotic disease.  Discussed with Dr. Mayme Genta, PA-C  06/28/2018 3:27 PM  This note was created with Dragon medical transcription system.  Any error is purely unintentional

## 2018-06-28 NOTE — Progress Notes (Signed)
Went to the floor to follow up the pt port. Per floor RN able to flush port and let it work.

## 2018-06-28 NOTE — ED Notes (Signed)
Pt on phone with son

## 2018-06-28 NOTE — Consult Note (Signed)
ANTICOAGULATION CONSULT NOTE - Initial Consult  Pharmacy Consult for Heparin Indication: VTE treatment  Allergies  Allergen Reactions  . No Known Allergies     Patient Measurements: Height: 5\' 2"  (157.5 cm) Weight: 160 lb (72.6 kg) IBW/kg (Calculated) : 50.1 Heparin Dosing Weight: 65.6kg  Vital Signs: BP: 143/109 (06/22 0649) Pulse Rate: 72 (06/22 0728)  Labs: Recent Labs    06/27/18 1931 06/28/18 0657  HGB 13.2 14.1  HCT 40.8 43.4  PLT 152 145*  APTT 28  --   LABPROT 12.9  --   INR 1.0  --   HEPARINUNFRC  --  0.18*  CREATININE 0.97 0.84  TROPONINI 0.04* 0.04*    Estimated Creatinine Clearance: 49 mL/min (by C-G formula based on SCr of 0.84 mg/dL).   Medical History: Past Medical History:  Diagnosis Date  . Anemia   . Atrial fibrillation ([place])    history of  . Breast cancer ([place]) 2015   LT MASTECTOMY  . Colon adenocarcinoma ([place])   . COPD (chronic obstructive pulmonary disease) ([place])   . Depression   . DJD (degenerative joint disease) of knee    right knee  . H/O hysterectomy with oophorectomy    for DUB  . History of depression   . HLD (hyperlipidemia)   . HTN (hypertension)   . LV (left ventricular) mural thrombus    history of, resolved (echo 04/09)  . Other primary cardiomyopathies   . Pacemaker 05/2007   [employer] ICD, [employer] lead, [facility] Dr Isaias Sakai  . Personal history of chemotherapy 2015   BREAST CA    Medications:  No PTA anticoagulant  Assessment: Patient with an acute DVT - pharmacy has been consulted for heparin drip  Patient's drip was stopped at approximately 0520 this morning due to scant amount of bleeding noted. Level was drawn@0657 , over an hour and a half after stop.  6/22@0657 : HL 0.18 subtherapeutic  Goal of Therapy:  Heparin level 0.3-0.7 units/ml Monitor platelets by anticoagulation protocol: Yes   Plan:  Due to the stopping of the drip for approximately 1.5 hours prior to level being drawn, this level  does not give [place] complete accuracy, therefore will not dose off of the level currently, and will continue current rate of 1050 units/hr  Will check heparin level (HL) in 8 hours per protocol  Pearla Dubonnet, PharmD Clinical Pharmacist 06/28/2018 8:07 AM

## 2018-06-28 NOTE — Telephone Encounter (Signed)
Opened in error

## 2018-06-28 NOTE — Progress Notes (Signed)
[facility]    Stopped to see patient and assess as per consult request. Patient is not in her room at present. Most likely no intervention is indicated would treat with anticoagulation. Full consult to follow.  Discussed with Eliot Ford PA-C 06/28/2018 12:08 PM

## 2018-06-28 NOTE — Consult Note (Signed)
ANTICOAGULATION CONSULT NOTE - Initial Consult  Pharmacy Consult for Heparin Indication: VTE treatment  Allergies  Allergen Reactions  . No Known Allergies     Patient Measurements: Height: 5\' 2"  (157.5 cm) Weight: 165 lb 9.6 oz (75.1 kg) IBW/kg (Calculated) : 50.1 Heparin Dosing Weight: 65.6kg  Vital Signs: Temp: 97.7 F (36.5 C) (06/22 2034) Temp Source: Oral (06/22 2034) BP: 146/89 (06/22 2034) Pulse Rate: 99 (06/22 2034)  Labs: Recent Labs    06/27/18 1931 06/28/18 0657 06/28/18 1607 06/28/18 2223  HGB 13.2 14.1  --   --   HCT 40.8 43.4  --   --   PLT 152 145*  --   --   APTT 28  --   --   --   LABPROT 12.9  --   --   --   INR 1.0  --   --   --   HEPARINUNFRC  --  0.18* <0.10* 0.23*  CREATININE 0.97 0.84  --   --   TROPONINI 0.04* 0.04* 0.04*  --     Estimated Creatinine Clearance: 49.8 mL/min (by C-G formula based on SCr of 0.84 mg/dL).   Medical History: Past Medical History:  Diagnosis Date  . Anemia   . Breast cancer ([place]) 2015   a. L mastectomy with chemoradiation  . Colon adenocarcinoma ([place])   . COPD (chronic obstructive pulmonary disease) ([place])   . Depression   . DJD (degenerative joint disease) of knee    right knee  . H/O hysterectomy with oophorectomy    for DUB  . History of depression   . HLD (hyperlipidemia)   . HTN (hypertension)   . LV (left ventricular) mural thrombus    history of, resolved (echo 04/09)  . NICM (nonischemic cardiomyopathy) ([place])    a. status post [place] with [employer] lead in 2009 at [facility], Dr. Boyd Kerbs  . Persistent atrial fibrillation    a. noted in Care Everywhere from 2008-2009; b. CHADS2VASc 6 (CHF, HTN, age x 2, vascular disease, female)    Medications:  No PTA anticoagulant  Assessment: Patient with an acute DVT - pharmacy has been consulted for heparin drip  Patient's drip was stopped at approximately 0520 this morning due to scant amount of bleeding noted. Level was drawn@0657 , over an  hour and a half after stop.  6/22@0657 : HL 0.18 subtherapeutic  6/22@1709 : HL <0.10 subtherapeutic. It was stopped 10 am to~2 pm today, per [employer]. Confirmed heparin has been running for ~3.5 hours.  Continue infusion @ 1050 units/hr   6/22 @2223  HL: 0.23. level subtherapeutic   Goal of Therapy:  Heparin level 0.3-0.7 units/ml Monitor platelets by anticoagulation protocol: Yes   Plan:  6/22 @ 2223 HL: 0.23. Level subtherapeutic. Will increase heparin infusion to Continue current rate of 1150 units/hr.  Recheck HL and CBC with AM labs.   Pernell Dupre, PharmD, BCPS Clinical Pharmacist 06/28/2018 11:23 PM

## 2018-06-28 NOTE — Progress Notes (Addendum)
[facility] [place] NAME: Julia Gordon    MR#:  [id number]  DATE OF BIRTH:  1934-11-29  SUBJECTIVE:  Patient without complaint, noted bilateral lower extremity swelling-check right lower extremity duplex to evaluate for DVT, vascular surgery to see given occlusive left popliteal vein  REVIEW OF SYSTEMS:  CONSTITUTIONAL: No fever, fatigue or weakness.  EYES: No blurred or double vision.  EARS, NOSE, AND THROAT: No tinnitus or ear pain.  RESPIRATORY: No cough, shortness of breath, wheezing or hemoptysis.  CARDIOVASCULAR: No chest pain, orthopnea, edema.  GASTROINTESTINAL: No nausea, vomiting, diarrhea or abdominal pain.  GENITOURINARY: No dysuria, hematuria.  ENDOCRINE: No polyuria, nocturia,  HEMATOLOGY: No anemia, easy bruising or bleeding SKIN: No rash or lesion. MUSCULOSKELETAL: No joint pain or arthritis.   NEUROLOGIC: No tingling, numbness, weakness.  PSYCHIATRY: No anxiety or depression.   ROS  DRUG ALLERGIES:   Allergies  Allergen Reactions  . No Known Allergies     VITALS:  Blood pressure (!) 157/116, pulse 86, temperature 97.8 F (36.6 C), temperature source Oral, resp. rate 20, height 5\' 2"  (1.575 m), weight 72.6 kg, SpO2 100 %.  PHYSICAL EXAMINATION:  GENERAL:  84 y.o.-year-old patient lying in the bed with no acute distress.  Frail-appearing EYES: Pupils equal, round, reactive to light and accommodation. No scleral icterus. Extraocular muscles intact.  HEENT: Head atraumatic, normocephalic. Oropharynx and nasopharynx clear.  NECK:  Supple, no jugular venous distention. No thyroid enlargement, no tenderness.  LUNGS: Normal breath sounds bilaterally, no wheezing, rales,rhonchi or crepitation. No use of accessory muscles of respiration.  CARDIOVASCULAR: S1, S2 normal. No murmurs, rubs, or gallops.  ABDOMEN: Soft, nontender, nondistended. Bowel sounds present. No organomegaly or mass.  EXTREMITIES: Bilateral lower extremity  edema, no cyanosis, or clubbing.  NEUROLOGIC: Cranial nerves II through XII are intact. Muscle strength 5/5 in all extremities. Sensation intact. Gait not checked.  PSYCHIATRIC: The patient is alert and oriented x 3.  SKIN: No obvious rash, lesion, or ulcer.   Physical Exam LABORATORY PANEL:   CBC Recent Labs  Lab 06/28/18 0657  WBC 5.6  HGB 14.1  HCT 43.4  PLT 145*   ------------------------------------------------------------------------------------------------------------------  Chemistries  Recent Labs  Lab 06/27/18 1931 06/28/18 0657  NA 142 141  K 3.4* 3.3*  CL 109 109  CO2 26 22  GLUCOSE 103* 107*  BUN 26* 23  CREATININE 0.97 0.84  CALCIUM 8.3* 8.5*  AST 37  --   ALT 36  --   ALKPHOS 35*  --   BILITOT 0.5  --    ------------------------------------------------------------------------------------------------------------------  Cardiac Enzymes Recent Labs  Lab 06/27/18 1931 06/28/18 0657  TROPONINI 0.04* 0.04*   ------------------------------------------------------------------------------------------------------------------  RADIOLOGY:  Dg Chest 2 View  Result Date: 06/27/2018 CLINICAL DATA:  84 year old female with acute shortness of breath EXAM: CHEST - 2 VIEW COMPARISON:  07/19/2017 and prior radiographs FINDINGS: Cardiomegaly, LEFT ICD and RIGHT IJ Port-A-Cath with tip overlying the SUPERIOR cavoatrial junction again noted. There is no evidence of focal airspace disease, pulmonary edema, suspicious pulmonary nodule/mass, pleural effusion, or pneumothorax. No acute bony abnormalities are identified. IMPRESSION: Cardiomegaly without evidence of acute cardiopulmonary disease. Electronically Signed   By: Margarette Canada M.D.   On: 06/27/2018 19:22   Ct Angio Chest Pe W And/or Wo Contrast  Result Date: 06/27/2018 CLINICAL DATA:  84 year old female with DVT and shortness of breath. EXAM: CT ANGIOGRAPHY CHEST WITH CONTRAST TECHNIQUE: Multidetector CT imaging of  the chest was performed using  the standard protocol during bolus administration of intravenous contrast. Multiplanar CT image reconstructions and MIPs were obtained to evaluate the vascular anatomy. CONTRAST:  34mL OMNIPAQUE IOHEXOL 350 MG/ML SOLN COMPARISON:  Chest radiograph dated 06/27/2018 FINDINGS: Cardiovascular: There is moderate cardiomegaly. No pericardial effusion. Advanced multi vessel coronary vascular calcification as well as calcification of the aortic root. Left pectoral pacemaker device is noted. There is moderate atherosclerotic calcification of the thoracic aorta. Evaluation of the aorta is limited due to non opacification and timing of the contrast. Curvilinear low density extending from the main pulmonary trunk into the right main pulmonary artery most consistent with mixing artifact. There is no CT evidence of pulmonary embolism. Right pectoral Port-A-Cath with tip close to the cavoatrial junction. Mediastinum/Nodes: No hilar or mediastinal adenopathy. Esophagus is grossly unremarkable. No mediastinal fluid collection. Lungs/Pleura: Trace bilateral pleural effusions. No pneumothorax. There is moderate centrilobular emphysema. No focal consolidation. The central airways are patent. Upper Abdomen: There is retrograde flow of contrast from the right atrium into the IVC consistent with right heart dysfunction. The visualized upper abdomen is otherwise unremarkable. Musculoskeletal: No chest wall abnormality. No acute or significant osseous findings. Review of the MIP images confirms the above findings. IMPRESSION: 1. No CT evidence of pulmonary embolism. 2. Moderate cardiomegaly with advanced multi vessel coronary vascular calcification. 3. Trace bilateral pleural effusions. 4. Aortic Atherosclerosis (ICD10-I70.0) and Emphysema (ICD10-J43.9). Electronically Signed   By: Anner Crete M.D.   On: 06/27/2018 20:55   US Venous Img Lower Unilateral Left  Result Date: 06/27/2018 CLINICAL DATA:   84 year old female with LEFT LOWER extremity pain and swelling for 2 days. EXAM: LEFT LOWER EXTREMITY VENOUS DOPPLER ULTRASOUND TECHNIQUE: Gray-scale sonography with graded compression, as well as color Doppler and duplex ultrasound were performed to evaluate the lower extremity deep venous systems from the level of the common femoral vein and including the common femoral, femoral, profunda femoral, popliteal and calf veins including the posterior tibial, peroneal and gastrocnemius veins when visible. The superficial great saphenous vein was also interrogated. Spectral Doppler was utilized to evaluate flow at rest and with distal augmentation maneuvers in the common femoral, femoral and popliteal veins. COMPARISON:  None. FINDINGS: Occlusive DVT is noted within the LEFT popliteal and calf veins. No DVT is identified within the LEFT common femoral, profundus femoral or femoral veins. IMPRESSION: Occlusive LEFT popliteal and calf DVT. Critical Value/emergent results were called by telephone at the time of interpretation on 06/27/2018 at 7:25 pm to Dr. Lenise Arena , who verbally acknowledged these results. Electronically Signed   By: Margarette Canada M.D.   On: 06/27/2018 19:27    ASSESSMENT AND PLAN:  *Acute left DVT  Noted occlusive left popliteal vein on left lower extremity ultrasound Continue heparin drip, vascular surgery to evaluate, given bilateral lower extremity edema-we will proceed with right lower extremity Doppler as well  *Acute on chronic diastolic CHF Resolving Continue congestive heart failure protocol, IV Lasix, strict I&O monitoring, daily weights, follow-up on echocardiogram, cardiology to see  *Acute elevated troponins  Inconsistent with ACS  Likely secondary to congestive heart failure/demand ischemia   *Chronic benign essential hypertension Stable on current regiment  *History of breast cancer Stable Tamoxifen continued  *Acute hypokalemia Replete as needed, check  magnesium level, BMP in the morning   DVT prophylaxis-on heparin drip  Disposition Home in 1-2 days barring any complications   All the records are reviewed and case discussed with Care Management/Social Workerr. Management plans discussed with the patient, family and  they are in agreement.  CODE STATUS: full  TOTAL TIME TAKING CARE OF THIS PATIENT: 35 minutes.    POSSIBLE D/C IN 1-3 DAYS, DEPENDING ON CLINICAL CONDITION.   Avel Peace Salary M.D on 06/28/2018   Between 7am to 6pm - Pager - [phone]  After 6pm go to [email or web address] - password [email or web address] [facility]  Office  [phone]  CC: Primary care physician; Ellamae Sia, MD  Note: This dictation was prepared with Dragon dictation along with smaller phrase technology. Any transcriptional errors that result from this process are unintentional.

## 2018-06-28 NOTE — Progress Notes (Signed)
ICD device check per Christell Faith.  Patient has single chamber ICD.  Mode is VVI 40.  No recent therapy or shocks.  Device has 3 years of longevity remaining.  [facility] [employer] [phone]

## 2018-06-28 NOTE — ED Notes (Signed)
In to answer pt's call bell; she states she's bleeding from her IV site; pt with 2 peripheral IV's present, one to right AC, one to right wrist area; Heparin is infusing to right wrist-scant amount of bleeding noted at catheter insertion site; IV site to right [facility] is leaking at catheter insertion site to the point of leaking out of the tegaderm; some bruising noted to area; pt is unaware of hitting arm on anything while she was using the toilet; in attempts to remove tape and tedaderm, IV catheter was accidentally removed; pressure had to be held for several minutes and arm wrapped in coban; admitting MD paged and notified of situation; verbal order to hold Heparin drip until morning labs have resulted;

## 2018-06-28 NOTE — ED Notes (Signed)
Pt up to toilet in room to have bowel movement; will use call bell when back in bed to be reconnected to monitor; pt ambulates with slow steady gait using walker;

## 2018-06-28 NOTE — ED Notes (Signed)
ED TO INPATIENT HANDOFF REPORT  ED Nurse Name and Phone #:  [phone]  S Name/Age/Gender Julia Gordon 84 y.o. female Room/Bed: ED10A/ED10A  Code Status   Code Status: Full Code  Home/SNF/Other Home Patient oriented to: self, place, time and situation Is this baseline? Yes   Triage Complete: Triage complete  Chief Complaint L Leg Pain Shob  Triage Note First Nurse Note:  C/O SOB and left leg swelling and cool to touch since Friday.  Patient is AAOx3.  No SOB/ DOE noted.  NAD  Left leg swelling since Friday with tenderness upon assessment. No known injury. Do discoloration noted. Sensation intact.   Pt also reporting increased SOB over the past week. No chest pain. Hx of CHF.     Allergies Allergies  Allergen Reactions  . No Known Allergies     Level of Care/Admitting Diagnosis ED Disposition    ED Disposition Condition [facility] Area: [place] [[id number]]  Level of Care: Telemetry [5]  Covid Evaluation: Screening Protocol (No Symptoms)  Diagnosis: Acute deep vein thrombosis (DVT) of left lower extremity [facility]) [[id number]]  Admitting Physician: Mayer Camel [[id number]]  Attending Physician: Mayer Camel [[id number]]  Estimated length of stay: past midnight tomorrow  Certification:: I certify this patient will need inpatient services for at least 2 midnights  PT Class (Do Not Modify): Inpatient [101]  PT Acc Code (Do Not Modify): Private [1]       B Medical/Surgery History Past Medical History:  Diagnosis Date  . Anemia   . Atrial fibrillation ([place])    history of  . Breast cancer ([place]) 2015   LT MASTECTOMY  . 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 COPD (chronic obstructive pulmonary disease) ([place])   . Depression   . DJD (degenerative joint disease) of knee    right knee  . H/O hysterectomy with oophorectomy    for DUB  . History of depression   . HLD (hyperlipidemia)   . HTN (hypertension)   . LV (left ventricular) mural  thrombus    history of, resolved (echo 04/09)  . Other primary cardiomyopathies   . Pacemaker 05/2007   [employer] ICD, [employer] lead, [facility] Dr Isaias Sakai  . Personal history of chemotherapy 2015   BREAST CA   Past Surgical History:  Procedure Laterality Date  . ABDOMINAL HYSTERECTOMY    . BREAST BIOPSY Left 05/10/13   [place] bx-positive  . COLONOSCOPY WITH PROPOFOL N/A 01/15/2017   Procedure: COLONOSCOPY WITH PROPOFOL;  Surgeon: Jonathon Bellows, MD;  Location: [facility] ENDOSCOPY;  Service: Gastroenterology;  Laterality: N/A;  . LAPAROSCOPIC RIGHT COLON RESECTION  08/10   Dr Pat Patrick, for adenoca   . MASTECTOMY Left 2015   BREAST CA  . OOPHORECTOMY    . ORIF FEMUR FRACTURE Right 07/21/2017   Procedure: OPEN REDUCTION INTERNAL FIXATION (ORIF) DISTAL FEMUR FRACTURE;  Surgeon: Lovell Sheehan, MD;  Location: [facility] ORS;  Service: Orthopedics;  Laterality: Right;  femur   . PACEMAKER INSERTION  2009  . REPLACEMENT TOTAL KNEE  1990's   right      A IV Location/Drains/Wounds Patient Lines/Drains/Airways Status   Active Line/Drains/Airways    Name:   Placement date:   Placement time:   Site:   Days:   Implanted Port Right Chest   -    -    Chest      Peripheral IV 06/27/18 Right Antecubital   06/27/18    1940  Antecubital   1   External Urinary Catheter   07/20/17    0245    -   343   Airway   07/21/17    1130     342   Incision (Closed) 07/21/17 Leg Right   07/21/17    1357     342          Intake/Output Last 24 hours  Intake/Output Summary (Last 24 hours) at 06/28/2018 0748 Last data filed at 06/27/2018 2225 Gross per 24 hour  Intake -  Output 800 ml  Net -800 ml    Labs/Imaging Results for orders placed or performed during the hospital encounter of 06/27/18 (from the past 48 hour(s))  CBC     Status: None   Collection Time: 06/27/18  7:31 PM  Result Value Ref Range   WBC 5.4 4.0 - 10.5 K/uL   RBC 4.21 3.87 - 5.11 MIL/uL   Hemoglobin 13.2 12.0 - 15.0 g/dL   HCT 40.8 36.0 - 46.0  %   MCV 96.9 80.0 - 100.0 fL   MCH 31.4 26.0 - 34.0 pg   MCHC 32.4 30.0 - 36.0 g/dL   RDW 15.5 11.5 - 15.5 %   Platelets 152 150 - 400 K/uL   nRBC 0.0 0.0 - 0.2 %    Comment: Performed at [facility], [address] [place], [place] [address]  Troponin I - ONCE - STAT     Status: Abnormal   Collection Time: 06/27/18  7:31 PM  Result Value Ref Range   Troponin I 0.04 (HH) <0.03 ng/mL    Comment: CRITICAL RESULT CALLED TO, READ BACK BY AND VERIFIED WITH SHANNON MARTIN AT 2054 ON 06/27/2018 JJB Performed at [facility], [address] [place], [place] [address]   Comprehensive metabolic panel     Status: Abnormal   Collection Time: 06/27/18  7:31 PM  Result Value Ref Range   Sodium 142 135 - 145 mmol/L   Potassium 3.4 (L) 3.5 - 5.1 mmol/L   Chloride 109 98 - 111 mmol/L   CO2 26 22 - 32 mmol/L   Glucose, Bld 103 (H) 70 - 99 mg/dL   BUN 26 (H) 8 - 23 mg/dL   Creatinine, Ser 0.97 0.44 - 1.00 mg/dL   Calcium 8.3 (L) 8.9 - 10.3 mg/dL   Total Protein 5.3 (L) 6.5 - 8.1 g/dL   Albumin 3.1 (L) 3.5 - 5.0 g/dL   AST 37 15 - 41 U/L   ALT 36 0 - 44 U/L   Alkaline Phosphatase 35 (L) 38 - 126 U/L   Total Bilirubin 0.5 0.3 - 1.2 mg/dL   GFR calc non Af Amer 55 (L) >60 mL/min   GFR calc Af Amer >60 >60 mL/min   Anion gap 7 5 - 15    Comment: Performed at [facility], [address] [place], [place] [address]  Brain natriuretic peptide     Status: Abnormal   Collection Time: 06/27/18  7:31 PM  Result Value Ref Range   B Natriuretic Peptide 2,183.0 (H) 0.0 - 100.0 pg/mL    Comment: Performed at [facility], [address] [place], [place] [address]  Protime-INR     Status: None   Collection Time: 06/27/18  7:31 PM  Result Value Ref Range   Prothrombin Time 12.9 11.4 - 15.2 seconds   INR 1.0 0.8 - 1.2    Comment: (NOTE) INR goal varies based on device and disease states. Performed  at [facility], [address] [place], [place]  [address]   APTT     Status: None   Collection Time: 06/27/18  7:31 PM  Result Value Ref Range   aPTT 28 24 - 36 seconds    Comment: Performed at [facility], [address] [place], [place] [address]  SARS Coronavirus 2 (CEPHEID - Performed in [place] hospital lab), Hosp Order     Status: None   Collection Time: 06/27/18  9:18 PM   Specimen: Nasopharyngeal Swab  Result Value Ref Range   SARS Coronavirus 2 NEGATIVE NEGATIVE    Comment: (NOTE) If result is NEGATIVE SARS-CoV-2 target nucleic acids are NOT DETECTED. The SARS-CoV-2 RNA is generally detectable in upper and lower  respiratory specimens during the acute phase of infection. The lowest  concentration of SARS-CoV-2 viral copies this assay can detect is 250  copies / mL. A negative result does not preclude SARS-CoV-2 infection  and should not be used as the sole basis for treatment or other  patient management decisions.  A negative result may occur with  improper specimen collection / handling, submission of specimen other  than nasopharyngeal swab, presence of viral mutation(s) within the  areas targeted by this assay, and inadequate number of viral copies  (<250 copies / mL). A negative result must be combined with clinical  observations, patient history, and epidemiological information. If result is POSITIVE SARS-CoV-2 target nucleic acids are DETECTED. The SARS-CoV-2 RNA is generally detectable in upper and lower  respiratory specimens dur ing the acute phase of infection.  Positive  results are indicative of active infection with SARS-CoV-2.  Clinical  correlation with patient history and other diagnostic information is  necessary to determine patient infection status.  Positive results do  not rule out bacterial infection or co-infection with other viruses. If result is PRESUMPTIVE POSTIVE SARS-CoV-2 nucleic acids MAY BE PRESENT.   A presumptive positive result was obtained on the submitted specimen  and  confirmed on repeat testing.  While 2019 novel coronavirus  (SARS-CoV-2) nucleic acids may be present in the submitted sample  additional confirmatory testing may be necessary for epidemiological  and / or clinical management purposes  to differentiate between  SARS-CoV-2 and other Sarbecovirus currently known to infect humans.  If clinically indicated additional testing with an alternate test  methodology [phone]) is advised. The SARS-CoV-2 RNA is generally  detectable in upper and lower respiratory sp ecimens during the acute  phase of infection. The expected result is Negative. Fact Sheet for Patients:  StrictlyIdeas.no Fact Sheet for Healthcare Providers: BankingDealers.co.za This test is not yet approved or cleared by the [place] FDA and has been authorized for detection and/or diagnosis of SARS-CoV-2 by FDA under an Emergency Use Authorization (EUA).  This EUA will remain in effect (meaning this test can be used) for the duration of the COVID-19 declaration under Section 564(b)(1) of the Act, 21 U.S.C. section 360bbb-3(b)(1), unless the authorization is terminated or revoked sooner. Performed at [facility], [place], [place] [address]   Heparin level (unfractionated)     Status: Abnormal   Collection Time: 06/28/18  6:57 AM  Result Value Ref Range   Heparin Unfractionated 0.18 (L) 0.30 - 0.70 IU/mL    Comment: (NOTE) If heparin results are below expected values, and patient dosage has  been confirmed, suggest follow up testing of antithrombin III levels. Performed at [facility], [address], [place], [place] [address]  CBC     Status: Abnormal   Collection Time: 06/28/18  6:57 AM  Result Value Ref Range   WBC 5.6 4.0 - 10.5 K/uL   RBC 4.57 3.87 - 5.11 MIL/uL   Hemoglobin 14.1 12.0 - 15.0 g/dL   HCT 43.4 36.0 - 46.0 %   MCV 95.0 80.0 - 100.0 fL   MCH 30.9 26.0 - 34.0 pg    MCHC 32.5 30.0 - 36.0 g/dL   RDW 15.5 11.5 - 15.5 %   Platelets 145 (L) 150 - 400 K/uL   nRBC 0.0 0.0 - 0.2 %    Comment: Performed at [facility], [address], [place], [place] [address]   Dg Chest 2 View  Result Date: 06/27/2018 CLINICAL DATA:  84 year old female with acute shortness of breath EXAM: CHEST - 2 VIEW COMPARISON:  07/19/2017 and prior radiographs FINDINGS: Cardiomegaly, LEFT ICD and RIGHT IJ Port-A-Cath with tip overlying the SUPERIOR cavoatrial junction again noted. There is no evidence of focal airspace disease, pulmonary edema, suspicious pulmonary nodule/mass, pleural effusion, or pneumothorax. No acute bony abnormalities are identified. IMPRESSION: Cardiomegaly without evidence of acute cardiopulmonary disease. Electronically Signed   By: Margarette Canada M.D.   On: 06/27/2018 19:22   Ct Angio Chest Pe W And/or Wo Contrast  Result Date: 06/27/2018 CLINICAL DATA:  84 year old female with DVT and shortness of breath. EXAM: CT ANGIOGRAPHY CHEST WITH CONTRAST TECHNIQUE: Multidetector CT imaging of the chest was performed using the standard protocol during bolus administration of intravenous contrast. Multiplanar CT image reconstructions and MIPs were obtained to evaluate the vascular anatomy. CONTRAST:  59mL OMNIPAQUE IOHEXOL 350 MG/ML SOLN COMPARISON:  Chest radiograph dated 06/27/2018 FINDINGS: Cardiovascular: There is moderate cardiomegaly. No pericardial effusion. Advanced multi vessel coronary vascular calcification as well as calcification of the aortic root. Left pectoral pacemaker device is noted. There is moderate atherosclerotic calcification of the thoracic aorta. Evaluation of the aorta is limited due to non opacification and timing of the contrast. Curvilinear low density extending from the main pulmonary trunk into the right main pulmonary artery most consistent with mixing artifact. There is no CT evidence of pulmonary embolism. Right pectoral Port-A-Cath with tip  close to the cavoatrial junction. Mediastinum/Nodes: No hilar or mediastinal adenopathy. Esophagus is grossly unremarkable. No mediastinal fluid collection. Lungs/Pleura: Trace bilateral pleural effusions. No pneumothorax. There is moderate centrilobular emphysema. No focal consolidation. The central airways are patent. Upper Abdomen: There is retrograde flow of contrast from the right atrium into the IVC consistent with right heart dysfunction. The visualized upper abdomen is otherwise unremarkable. Musculoskeletal: No chest wall abnormality. No acute or significant osseous findings. Review of the MIP images confirms the above findings. IMPRESSION: 1. No CT evidence of pulmonary embolism. 2. Moderate cardiomegaly with advanced multi vessel coronary vascular calcification. 3. Trace bilateral pleural effusions. 4. Aortic Atherosclerosis (ICD10-I70.0) and Emphysema (ICD10-J43.9). Electronically Signed   By: Anner Crete M.D.   On: 06/27/2018 20:55   US Venous Img Lower Unilateral Left  Result Date: 06/27/2018 CLINICAL DATA:  84 year old female with LEFT LOWER extremity pain and swelling for 2 days. EXAM: LEFT LOWER EXTREMITY VENOUS DOPPLER ULTRASOUND TECHNIQUE: Gray-scale sonography with graded compression, as well as color Doppler and duplex ultrasound were performed to evaluate the lower extremity deep venous systems from the level of the common femoral vein and including the common femoral, femoral, profunda femoral, popliteal and calf veins including the posterior tibial, peroneal and gastrocnemius veins when visible. The superficial great saphenous vein was also  interrogated. Spectral Doppler was utilized to evaluate flow at rest and with distal augmentation maneuvers in the common femoral, femoral and popliteal veins. COMPARISON:  None. FINDINGS: Occlusive DVT is noted within the LEFT popliteal and calf veins. No DVT is identified within the LEFT common femoral, profundus femoral or femoral veins.  IMPRESSION: Occlusive LEFT popliteal and calf DVT. Critical Value/emergent results were called by telephone at the time of interpretation on 06/27/2018 at 7:25 pm to Dr. Lenise Arena , who verbally acknowledged these results. Electronically Signed   By: Margarette Canada M.D.   On: 06/27/2018 19:27    Pending Labs Unresulted Labs (From admission, onward)    Start     Ordered   06/28/18 6834  Basic metabolic panel  Tomorrow morning,   STAT     06/27/18 2303   06/27/18 2304  Troponin I - Now Then Q6H  Now then every 6 hours,   STAT     06/27/18 2303          Vitals/Pain Today's Vitals   06/28/18 0300 06/28/18 0649 06/28/18 0652 06/28/18 0728  BP: (!) 146/96 (!) 143/109    Pulse: 78 71  72  Resp: (!) 22 15  11   Temp:      TempSrc:      SpO2: 96% 99%  100%  Weight:      Height:      PainSc:   0-No pain     Isolation Precautions No active isolations  Medications Medications  heparin bolus via infusion 4,000 Units (4,000 Units Intravenous Bolus from Bag 06/27/18 2305)    And  heparin ADULT infusion 100 units/mL (25000 units/261mL sodium chloride 0.45%) (0 Units/hr Intravenous Paused 06/28/18 0520)  isosorbide mononitrate (IMDUR) 24 hr tablet 30 mg (has no administration in time range)  potassium chloride SA (K-DUR) CR tablet 20 mEq (has no administration in time range)  lisinopril (ZESTRIL) tablet 20 mg (has no administration in time range)  pravastatin (PRAVACHOL) tablet 40 mg (has no administration in time range)  metoprolol succinate (TOPROL-XL) 24 hr tablet 50 mg (has no administration in time range)  tamoxifen (NOLVADEX) tablet 20 mg (has no administration in time range)  acetaminophen (TYLENOL) tablet 650 mg (has no administration in time range)    Or  acetaminophen (TYLENOL) suppository 650 mg (has no administration in time range)  polyethylene glycol (MIRALAX / GLYCOLAX) packet 17 g (has no administration in time range)  ondansetron (ZOFRAN) tablet 4 mg (has no  administration in time range)    Or  ondansetron (ZOFRAN) injection 4 mg (has no administration in time range)  furosemide (LASIX) injection 20 mg (20 mg Intravenous Not Given 06/27/18 2305)  potassium chloride (KLOR-CON) packet 20 mEq (has no administration in time range)  pantoprazole (PROTONIX) injection 40 mg (has no administration in time range)  iohexol (OMNIPAQUE) 350 MG/ML injection 75 mL (75 mLs Intravenous Contrast Given 06/27/18 2036)  furosemide (LASIX) injection 20 mg (20 mg Intravenous Given 06/27/18 2118)    Mobility walks Low fall risk   Focused Assessments Cardiac Assessment Handoff:  Cardiac Rhythm: Normal sinus rhythm Lab Results  Component Value Date   TROPONINI 0.04 ([place]) 06/27/2018   No results found for: DDIMER Does the Patient currently have chest pain? No  , Pulmonary Assessment Handoff:  Lung sounds: Bilateral Breath Sounds: Clear L Breath Sounds: Clear R Breath Sounds: Clear O2 Device: Room Air   Per MD, heparin drip paused d/t bleeding/bruising at IV insertion site pending morning labs. Pt  has 22G to RFA still intact, small amount of bleeding at insertion site, flushes well. PIV removed at [facility] by night shift RN for bleeding/swelling. Pt able to ambulate to bathroom independently.   R Recommendations: See Admitting Provider Note  Report given to:   Additional Notes:

## 2018-06-29 DIAGNOSIS — I82402 Acute embolism and thrombosis of unspecified deep veins of left lower extremity: Secondary | ICD-10-CM

## 2018-06-29 LAB — BASIC METABOLIC PANEL
Anion gap: 11 (ref 5–15)
BUN: 24 mg/dL — ABNORMAL HIGH (ref 8–23)
CO2: 22 mmol/L (ref 22–32)
Calcium: 8.6 mg/dL — ABNORMAL LOW (ref 8.9–10.3)
Chloride: 106 mmol/L (ref 98–111)
Creatinine, Ser: 0.98 mg/dL (ref 0.44–1.00)
GFR calc Af Amer: >60 mL/min (ref >60)
GFR calc non Af Amer: 54 mL/min — ABNORMAL LOW (ref >60)
Glucose, Bld: 114 mg/dL — ABNORMAL HIGH (ref 70–99)
Potassium: 3.5 mmol/L (ref 3.5–5.1)
Sodium: 139 mmol/L (ref 135–145)

## 2018-06-29 LAB — CBC
HCT: 47.7 % — ABNORMAL HIGH (ref 36.0–46.0)
Hemoglobin: 15.4 g/dL — ABNORMAL HIGH (ref 12.0–15.0)
MCH: 31.1 pg (ref 26.0–34.0)
MCHC: 32.3 g/dL (ref 30.0–36.0)
MCV: 96.4 fL (ref 80.0–100.0)
Platelets: 165 10*3/uL (ref 150–400)
RBC: 4.95 MIL/uL (ref 3.87–5.11)
RDW: 15.3 % (ref 11.5–15.5)
WBC: 6.1 10*3/uL (ref 4.0–10.5)
nRBC: 0 % (ref 0.0–0.2)

## 2018-06-29 LAB — HEPARIN LEVEL (UNFRACTIONATED): Heparin Unfractionated: 0.59 IU/mL (ref 0.30–0.70)

## 2018-06-29 MED ORDER — APIXABAN 5 MG PO TABS
10.0000 mg | ORAL_TABLET | Freq: Two times a day (BID) | ORAL | Status: DC
  Administered 2018-06-29 – 2018-06-30 (×3): 10 mg via ORAL
  Filled 2018-06-29 (×3): qty 2

## 2018-06-29 MED ORDER — APIXABAN 5 MG PO TABS: 5.0000 mg | ORAL_TABLET | Freq: Two times a day (BID) | ORAL | Status: DC

## 2018-06-29 MED ORDER — SPIRONOLACTONE 25 MG PO TABS
25.0000 mg | ORAL_TABLET | Freq: Every day | ORAL | Status: DC
  Administered 2018-06-30: 25 mg via ORAL
  Filled 2018-06-29: qty 1

## 2018-06-29 MED ORDER — TRAZODONE HCL 50 MG PO TABS
50.0000 mg | ORAL_TABLET | Freq: Every evening | ORAL | Status: DC | PRN
  Administered 2018-06-29: 50 mg via ORAL
  Filled 2018-06-29: qty 1

## 2018-06-29 NOTE — Evaluation (Signed)
Physical Therapy Evaluation Patient Details Name: Julia Gordon MRN: [id number] DOB: 05/14/1934 Today's Date: 06/29/2018   History of Present Illness  The patient is an 84 year old female with a past medical history of persistent atrial fibrillation, nonischemic cardiomyopathy, pretension, hyperlipidemia, depression, degenerative joint disease, COPD, history of colon and breast cancer, anemia, acute on chronic congestive systolic heart failure, implantable cardiac defibrillator in place who presented to the [facility] emergency department progressively worsening left lower extremity pain and swelling x3 days. Patient + for DVT with Heprin drip given 6/21-6/22 with continuing of oral anticoagulation drugs this date (06/29/18). Chest CT negative for PE. PLOF takes care of 90 and 8 year old grandchildren, ambulates with quad cane, has RW for community distances, relies on public transportation or children/grandchildren as needed.  Clinical Impression  Patient is a pleasant 84 year old female presenting with deficits in strength, activity tolerance, and balance. Patient demonstrates modI with STS with some cuing initially to ensure safety with AD (uses RW and quad cane), and is able to ambulate 119ft with RW and 198ft with quad cane with supervision for safety. Patient with increased difficulty with RW safety despite PT cuing, but has increased cadence and bilat step length compared to quad cane, though she reports she prefers quad cane. Has difficulty maintaining balance with challenge or with altered support, and slows down with ambulation to make turns d/t unsteadiness. Is currently unable to ambulate community distances to complete errand running, and has difficulty with balance and activity tolerance, inhibiting her ability to safely take care of her grandchildren. Would benefit from skilled PT to address above deficits and promote optimal return to PLOF     Follow Up  Recommendations Home health PT    Equipment Recommendations  Rolling walker with 5" wheels    Recommendations for Other Services       Precautions / Restrictions Precautions Precautions: Fall      Mobility  Bed Mobility Overal bed mobility: Independent             General bed mobility comments: Not seen- patient reports she has been completing all independently  Transfers Overall transfer level: Needs assistance   Transfers: Sit to/from Stand Sit to Stand: Modified independent (Device/Increase time)         General transfer comment: Patient able to complete STS with quad cane and RW with safety following min cuing to ensure proper set up  Ambulation/Gait Ambulation/Gait assistance: Supervision Gait Distance (Feet): 200 Feet Assistive device: Rolling walker (2 wheeled);Quad cane Gait Pattern/deviations: WFL(Within Functional Limits) Gait velocity: decreased   General Gait Details: 140ft with RW, 100 ft with quad cane. Patient with more normalized gait with RW, with decreased cadence with quad cane and decreased bilat step length  Stairs            Wheelchair Mobility    Modified Rankin (Stroke Patients Only)       Balance Overall balance assessment: Needs assistance   Sitting balance-Leahy Scale: Good       Standing balance-Leahy Scale: Fair Standing balance comment: able to tolerate min challenge; stand without UE in normal stance, unable with narrow BOS or semi tandem                             Pertinent Vitals/Pain Pain Assessment: No/denies pain    Home Living Family/patient expects to be discharged to:: Private residence Living Arrangements: Children;Other relatives Available Help at Discharge:  Family Type of Home: House Home Access: Stairs to enter Entrance Stairs-Rails: Right Entrance Stairs-Number of Steps: 2 Home Layout: One level Home Equipment: Cane - quad;Shower seat;Walker - 2 wheels      Prior Function  Level of Independence: Independent with assistive device(s)         Comments: Pt reports independence with ADLs/IADLs. No falls in the last 12 months     Hand Dominance   Dominant Hand: Right    Extremity/Trunk Assessment   Upper Extremity Assessment Upper Extremity Assessment: Overall WFL for tasks assessed    Lower Extremity Assessment Lower Extremity Assessment: Overall WFL for tasks assessed    Cervical / Trunk Assessment Cervical / Trunk Assessment: Normal  Communication   Communication: No difficulties  Cognition                                              General Comments      Exercises Other Exercises Other Exercises: STS trials x3 with RW and cane with min cuing to ensure safety with AD, with good carry over and ind with transfer. Some difficulty with control with descent that is better with RW Other Exercises: Amb over 154ft with RW with cuing to stay close to RW for safety and gaurding for safety. Able to ambulate with quad cane 111ft with patient initially putting cane in wrong hand based off cane orientation, which she is able to change with cuing. More cuing for increased stride with quad cane with patient having more difficulty with this and decreased cadence as opposed to RW. Very fatigued following   Assessment/Plan    PT Assessment Patient needs continued PT services  PT Problem List Decreased strength;Decreased balance;Decreased activity tolerance       PT Treatment Interventions DME instruction;Therapeutic activities;Gait training;Therapeutic exercise;Patient/family education;Stair training;Functional mobility training;Neuromuscular re-education;Manual techniques    PT Goals (Current goals can be found in the Care Plan section)  Acute Rehab PT Goals Patient Stated Goal: Go home PT Goal Formulation: With patient Time For Goal Achievement: 07/13/18 Potential to Achieve Goals: Fair    Frequency Min 2X/week   Barriers to  discharge Decreased caregiver support      Co-evaluation               AM-PAC PT "6 Clicks" Mobility  Outcome Measure Help needed turning from your back to your side while in a flat bed without using bedrails?: None Help needed moving from lying on your back to sitting on the side of a flat bed without using bedrails?: None Help needed moving to and from a bed to a chair (including a wheelchair)?: None Help needed standing up from a chair using your arms (e.g., wheelchair or bedside chair)?: None Help needed to walk in hospital room?: A Little Help needed climbing 3-5 steps with a railing? : A Lot 6 Click Score: 21    End of Session Equipment Utilized During Treatment: Gait belt Activity Tolerance: Patient tolerated treatment well Patient left: in chair;with chair alarm set Nurse Communication: Mobility status PT Visit Diagnosis: Other abnormalities of gait and mobility (R26.89);Difficulty in walking, not elsewhere classified (R26.2)    Time: 0230-0255 PT Time Calculation (min) (ACUTE ONLY): 25 min   Charges:   PT Evaluation $PT Eval Moderate Complexity: 1 Mod PT Treatments $Gait Training: 8-22 mins       Shelton Silvas PT, DPT  Shelton Silvas 06/29/2018, 3:21 PM

## 2018-06-29 NOTE — Consult Note (Signed)
ANTICOAGULATION CONSULT NOTE - Initial Consult  Pharmacy Consult for Heparin Indication: VTE treatment  Allergies  Allergen Reactions  . No Known Allergies     Patient Measurements: Height: 5\' 2"  (157.5 cm) Weight: 165 lb 9.6 oz (75.1 kg) IBW/kg (Calculated) : 50.1 Heparin Dosing Weight: 65.6kg  Vital Signs: Temp: 97.3 F (36.3 C) (06/23 0728) Temp Source: Oral (06/23 0728) BP: 139/101 (06/23 0728) Pulse Rate: 82 (06/23 0728)  Labs: Recent Labs    06/27/18 1931  06/28/18 0657 06/28/18 1607 06/28/18 2223 06/29/18 0739  HGB 13.2  --  14.1  --   --  15.4*  HCT 40.8  --  43.4  --   --  47.7*  PLT 152  --  145*  --   --  165  APTT 28  --   --   --   --   --   LABPROT 12.9  --   --   --   --   --   INR 1.0  --   --   --   --   --   HEPARINUNFRC  --    < > 0.18* <0.10* 0.23* 0.59  CREATININE 0.97  --  0.84  --   --  0.98  TROPONINI 0.04*  --  0.04* 0.04*  --   --    < > = values in this interval not displayed.    Estimated Creatinine Clearance: 42.7 mL/min (by C-G formula based on SCr of 0.98 mg/dL).   Medical History: Past Medical History:  Diagnosis Date  . Anemia   . Breast cancer ([place]) 2015   a. L mastectomy with chemoradiation  . Colon adenocarcinoma ([place])   . COPD (chronic obstructive pulmonary disease) ([place])   . Depression   . DJD (degenerative joint disease) of knee    right knee  . H/O hysterectomy with oophorectomy    for DUB  . History of depression   . HLD (hyperlipidemia)   . HTN (hypertension)   . LV (left ventricular) mural thrombus    history of, resolved (echo 04/09)  . NICM (nonischemic cardiomyopathy) ([place])    a. status post [address] with [employer] lead in 2009 at [facility], Dr. Boyd Kerbs  . Persistent atrial fibrillation    a. noted in Care Everywhere from 2008-2009; b. CHADS2VASc 6 (CHF, HTN, age x 2, vascular disease, female)    Medications:  No PTA anticoagulant  Assessment: Patient with an acute DVT - pharmacy has been  consulted for heparin drip  Patient's drip was stopped at approximately 0520 this morning due to scant amount of bleeding noted. Level was drawn@0657 , over an hour and a half after stop.  6/22@0657 : HL 0.18 subtherapeutic 6/22@1709 : HL <0.10 subtherapeutic. It was stopped 10 am to~2 pm today, per [employer]. Confirmed heparin has been running for ~3.5 hours.  Continue infusion @ 1050 units/hr  6/22 @2223  HL: 0.23. level subtherapeutic  6/23 @0739  HL: 0.59. therapeutic   Goal of Therapy:  Heparin level 0.3-0.7 units/ml Monitor platelets by anticoagulation protocol: Yes   Plan:  Heparin level therapeutic. Will continue current rate. Recheck HL in 8 hours and CBC with AM labs.   Oswald Hillock, PharmD, BCPS Clinical Pharmacist 06/29/2018 8:18 AM

## 2018-06-29 NOTE — Discharge Instructions (Signed)
Vascular Surgery Discharge Instructions: 1) Please elevate your legs heart level or higher as much as possible.

## 2018-06-29 NOTE — Progress Notes (Signed)
Progress Note  Patient Name: Julia Gordon Date of Encounter: 06/29/2018  Primary Cardiologist: Festus Aloe previously (2009 last evaluation); Previously seen by Dr. Lovena Le in 2014; Dr. Saunders Revel rounding  Subjective   Patient denies CP, palpitations, or racing heart rate. Does not feel SOB. Eager to ambulate in the hallway and "get moving."  Inpatient Medications    Scheduled Meds:  apixaban  10 mg Oral BID   Followed by   Derrill Memo ON 07/06/2018] apixaban  5 mg Oral BID   atorvastatin  40 mg Oral q1800   furosemide  20 mg Intravenous Q12H   isosorbide mononitrate  30 mg Oral Daily   lisinopril  20 mg Oral Daily   metoprolol succinate  50 mg Oral Daily   pantoprazole  40 mg Oral Daily   sodium chloride flush  10-40 mL Intracatheter Q12H   [START ON 06/30/2018] spironolactone  25 mg Oral Daily   tamoxifen  20 mg Oral Daily   Continuous Infusions:  PRN Meds: acetaminophen **OR** acetaminophen, hydrALAZINE, ondansetron **OR** ondansetron (ZOFRAN) IV, polyethylene glycol, sodium chloride flush   Vital Signs    Vitals:   06/28/18 1644 06/28/18 2034 06/29/18 0507 06/29/18 0728  BP: 126/85 (!) 146/89 (!) 145/85 (!) 139/101  Pulse: 66 99 (!) 58 82  Resp: 18     Temp: 97.6 F (36.4 C) 97.7 F (36.5 C) 97.7 F (36.5 C) (!) 97.3 F (36.3 C)  TempSrc: Oral Oral Oral Oral  SpO2: 98% 100% 99% 98%  Weight:      Height:        Intake/Output Summary (Last 24 hours) at 06/29/2018 1441 Last data filed at 06/29/2018 0552 Gross per 24 hour  Intake 187.8 ml  Output 600 ml  Net -412.2 ml   Filed Weights   06/27/18 1824 06/28/18 0912  Weight: 72.6 kg 75.1 kg    Telemetry    NSR, sinus tachycardia with PACs and PVCs, 6 beat run of NSVT- Personally Reviewed  ECG    No new tracings- Personally Reviewed  Physical Exam   GEN: No acute distress.  Sitting in recliner next to bed. Neck: No JVD Cardiac:  Tachycardic but regular, 1/6 systolic murmur. No rubs or gallops.    Respiratory: Clear to auscultation bilaterally. GI: Soft, nontender, non-distended  MS: 1-2+ edema of L calf and associated warmth, mild RLE ; No deformity. Neuro:  Nonfocal  Psych: Normal affect   Labs    Chemistry Recent Labs  Lab 06/27/18 1931 06/28/18 0657 06/29/18 0739  NA 142 141 139  K 3.4* 3.3* 3.5  CL 109 109 106  CO2 26 22 22   GLUCOSE 103* 107* 114*  BUN 26* 23 24*  CREATININE 0.97 0.84 0.98  CALCIUM 8.3* 8.5* 8.6*  PROT 5.3*  --   --   ALBUMIN 3.1*  --   --   AST 37  --   --   ALT 36  --   --   ALKPHOS 35*  --   --   BILITOT 0.5  --   --   GFRNONAA 55* >60 54*  GFRAA >60 >60 >60  ANIONGAP 7 10 11      Hematology Recent Labs  Lab 06/27/18 1931 06/28/18 0657 06/29/18 0739  WBC 5.4 5.6 6.1  RBC 4.21 4.57 4.95  HGB 13.2 14.1 15.4*  HCT 40.8 43.4 47.7*  MCV 96.9 95.0 96.4  MCH 31.4 30.9 31.1  MCHC 32.4 32.5 32.3  RDW 15.5 15.5 15.3  PLT 152 145* 165  Cardiac Enzymes Recent Labs  Lab 06/27/18 1931 06/28/18 0657 06/28/18 1607  TROPONINI 0.04* 0.04* 0.04*   No results for input(s): TROPIPOC in the last 168 hours.   BNP Recent Labs  Lab 06/27/18 1931  BNP 2,183.0*     DDimer No results for input(s): DDIMER in the last 168 hours.   Radiology    Dg Chest 2 View  Result Date: 06/27/2018 CLINICAL DATA:  84 year old female with acute shortness of breath EXAM: CHEST - 2 VIEW COMPARISON:  07/19/2017 and prior radiographs FINDINGS: Cardiomegaly, LEFT ICD and RIGHT IJ Port-A-Cath with tip overlying the SUPERIOR cavoatrial junction again noted. There is no evidence of focal airspace disease, pulmonary edema, suspicious pulmonary nodule/mass, pleural effusion, or pneumothorax. No acute bony abnormalities are identified. IMPRESSION: Cardiomegaly without evidence of acute cardiopulmonary disease. Electronically Signed   By: Margarette Canada M.D.   On: 06/27/2018 19:22   Ct Angio Chest Pe W And/or Wo Contrast  Result Date: 06/27/2018 CLINICAL DATA:   84 year old female with DVT and shortness of breath. EXAM: CT ANGIOGRAPHY CHEST WITH CONTRAST TECHNIQUE: Multidetector CT imaging of the chest was performed using the standard protocol during bolus administration of intravenous contrast. Multiplanar CT image reconstructions and MIPs were obtained to evaluate the vascular anatomy. CONTRAST:  12mL OMNIPAQUE IOHEXOL 350 MG/ML SOLN COMPARISON:  Chest radiograph dated 06/27/2018 FINDINGS: Cardiovascular: There is moderate cardiomegaly. No pericardial effusion. Advanced multi vessel coronary vascular calcification as well as calcification of the aortic root. Left pectoral pacemaker device is noted. There is moderate atherosclerotic calcification of the thoracic aorta. Evaluation of the aorta is limited due to non opacification and timing of the contrast. Curvilinear low density extending from the main pulmonary trunk into the right main pulmonary artery most consistent with mixing artifact. There is no CT evidence of pulmonary embolism. Right pectoral Port-A-Cath with tip close to the cavoatrial junction. Mediastinum/Nodes: No hilar or mediastinal adenopathy. Esophagus is grossly unremarkable. No mediastinal fluid collection. Lungs/Pleura: Trace bilateral pleural effusions. No pneumothorax. There is moderate centrilobular emphysema. No focal consolidation. The central airways are patent. Upper Abdomen: There is retrograde flow of contrast from the right atrium into the IVC consistent with right heart dysfunction. The visualized upper abdomen is otherwise unremarkable. Musculoskeletal: No chest wall abnormality. No acute or significant osseous findings. Review of the MIP images confirms the above findings. IMPRESSION: 1. No CT evidence of pulmonary embolism. 2. Moderate cardiomegaly with advanced multi vessel coronary vascular calcification. 3. Trace bilateral pleural effusions. 4. Aortic Atherosclerosis (ICD10-I70.0) and Emphysema (ICD10-J43.9). Electronically Signed    By: Anner Crete M.D.   On: 06/27/2018 20:55   US Venous Img Lower Unilateral Left  Result Date: 06/27/2018 CLINICAL DATA:  84 year old female with LEFT LOWER extremity pain and swelling for 2 days. EXAM: LEFT LOWER EXTREMITY VENOUS DOPPLER ULTRASOUND TECHNIQUE: Gray-scale sonography with graded compression, as well as color Doppler and duplex ultrasound were performed to evaluate the lower extremity deep venous systems from the level of the common femoral vein and including the common femoral, femoral, profunda femoral, popliteal and calf veins including the posterior tibial, peroneal and gastrocnemius veins when visible. The superficial great saphenous vein was also interrogated. Spectral Doppler was utilized to evaluate flow at rest and with distal augmentation maneuvers in the common femoral, femoral and popliteal veins. COMPARISON:  None. FINDINGS: Occlusive DVT is noted within the LEFT popliteal and calf veins. No DVT is identified within the LEFT common femoral, profundus femoral or femoral veins. IMPRESSION: Occlusive LEFT popliteal and  calf DVT. Critical Value/emergent results were called by telephone at the time of interpretation on 06/27/2018 at 7:25 pm to Dr. Lenise Arena , who verbally acknowledged these results. Electronically Signed   By: Margarette Canada M.D.   On: 06/27/2018 19:27   US Venous Img Lower Unilateral Right  Result Date: 06/28/2018 CLINICAL DATA:  Right lower extremity pain and edema. Imaging yesterday demonstrated left popliteal and calf vein DVT. EXAM: RIGHT LOWER EXTREMITY VENOUS DOPPLER ULTRASOUND TECHNIQUE: Gray-scale sonography with graded compression, as well as color Doppler and duplex ultrasound were performed to evaluate the lower extremity deep venous systems from the level of the common femoral vein and including the common femoral, femoral, profunda femoral, popliteal and calf veins including the posterior tibial, peroneal and gastrocnemius veins when visible. The  superficial great saphenous vein was also interrogated. Spectral Doppler was utilized to evaluate flow at rest and with distal augmentation maneuvers in the common femoral, femoral and popliteal veins. COMPARISON:  Left lower extremity venous duplex ultrasound on 06/27/2018 FINDINGS: Contralateral Common Femoral Vein: Respiratory phasicity is normal and symmetric with the symptomatic side. No evidence of thrombus. Normal compressibility. Common Femoral Vein: No evidence of thrombus. Normal compressibility, respiratory phasicity and response to augmentation. Saphenofemoral Junction: No evidence of thrombus. Normal compressibility and flow on color Doppler imaging. Profunda Femoral Vein: No evidence of thrombus. Normal compressibility and flow on color Doppler imaging. Femoral Vein: No evidence of thrombus. Normal compressibility, respiratory phasicity and response to augmentation. Popliteal Vein: No evidence of thrombus. Normal compressibility, respiratory phasicity and response to augmentation. Calf Veins: There is thrombus isolated to the right peroneal vein. Other visualized calf veins are normally patent. Superficial Great Saphenous Vein: No evidence of thrombus. Normal compressibility. Venous Reflux:  None. Other Findings: No evidence of superficial thrombophlebitis or abnormal fluid collection. IMPRESSION: Right peroneal vein DVT. Electronically Signed   By: Aletta Edouard M.D.   On: 06/28/2018 12:22    Cardiac Studies   TTE 6/22  1. The left ventricle has severely reduced systolic function, with an ejection fraction of 25-30%. The cavity size was mild to moderately dilated. Left ventricular diastolic Doppler parameters are consistent with pseudonormalization. Left ventricular  diffuse hypokinesis.  2. The right ventricle has mildly reduced systolic function. The cavity was normal. There is no increase in right ventricular wall thickness. Right ventricular systolic pressure is severely elevated with  an estimated pressure of 71.0 mmHg.  3. Right atrial size was mildly dilated.  4. The mitral valve is degenerative. Mild thickening of the mitral valve leaflet. There is mild mitral annular calcification present. Mitral valve regurgitation is mild to moderate by color flow Doppler.  5. The tricuspid valve is not well visualized. Tricuspid valve regurgitation is moderate.  6. The aortic valve is tricuspid. Mild thickening of the aortic valve. Mild calcification of the aortic valve. Aortic valve regurgitation is mild by color flow Doppler.  7. The aortic root is normal in size and structure.  8. The inferior vena cava was dilated in size with <50% respiratory variability.  9. The interatrial septum was not well visualized.  ICD device check  6/22 Patient has single chamber ICD.  Mode is VVI 40.  No recent therapy or shocks.  Device has 3 years of longevity remaining. Karleen Dolphin [employer] [phone]  Patient Profile     84 y.o. female with a history of HFrEF due to an ICM s/p ICD at [facility], persistent A. fib not previously on anticoagulation, left-sided breast cancer, hypertension,  hyperlipidemia, and for whom we have been asked to evaluate for shortness of breath and left calf DVT.  Assessment & Plan    HFrEF (EF 25-30%) --Some LEE noted on exam today and with LLE edema greater than right d/t DVT. Reports improvement in shortness of breath today. TED hose applied today for LEE. - HFrEF 2/2 presumed and ICM s/p [employer] ICD.  Lost to follow-up since 2014.  [place] interrogated as above.   -BNP elevated at 2183 with trace bilateral pleural effusions noted on CTA at admission. - Echo 6/22 as above shows EF 25 to 30%. Left ventricular diffuse hypokinesis. Severely elevated RVSP with estimated pressure 71.0 mmHg.  Degenerative mitral valve.  Mild to moderate mitral regurgitation. ---Continue diuresis with furosemide 20 mg IV twice daily and titrate as needed for  goal 1 to 2 L/day.  Will need transitioned to oral diuretic before discharge. Of note, was on PTA lasix 20mg  daily on PRN basis prior to admission and with potassium supplementation of 9mEq daily.  --Daily weights, monitor I/O. Daily BMET. Wt 160  165lbs. -972.2 over last 24h / since admission. -Continue medical management with lisinopril, metoprolol succinate, spironolactone, and isosorbide mononitrate 30 mg daily.  Consider escalation of spironolactone to 50 mg daily as PTA meds include spironolactone 50 mg daily.  Addition of hydralazine could also be considered to optimize evidence-based heart failure therapy but with consideration of PTA lasix and monitoring of electrolytes.  Consider also future transition from ACE to [facility] this admission or as an outpatient and following 36-hour washout of ACE.  --Will need to reestablish patient with the EP for ICD before discharge. Will also need outpatient follow-up with general cardiology and follow-up labs including BMET.   Elevated troponin with multivessel coronary artery calcification -Continues to deny chest pain. Troponin minimally elevated and flat trending at 0.04, not consistent with ACS and likely supply demand ischemia. Echo updated as above with severely reduced EF and severe pulmonary hypertension, mild to moderate MR. - Continue medical management with ACE / lisinopril 20mg  daily,Toprol-XL 50mg  daily, Imdur 30mg  daily, spironolactone, and Lipitor. Given suboptimally controlled blood pressure as below, consider escalation of spironolactone to PTA dose of 50 mg daily. -Recommend outpatient follow-up to be scheduled at discharge for further discussion at that time regarding outpatient ischemic evaluation. Consider periodic echo to monitor MR as well.  Persistent A. fib with RVR -Currently in sinus rhythm to sinus tachycardia with PACs/PVCs and 6 beat run of NSVT. -Daily BMET to monitor electrolytes.  Continue potassium repletion with goal 4.0  (currently 3.5).  Magnesium 2.1.  TSH 2.291. -Continue Toprol-XL as above. -Continue anticoagulation with Eliquis given her L calf DVT. -Recommend follow-up with EP as above and given ICD (lost to follow-up in 2014) -Single-chamber ICD interrogated and mode FEV1 40 without recent therapy or shocks.  3 years of longevity remaining.  Left calf DVT -Seen by vascular surgery with recommendation for anticoagulation.  Intervention is not indicated at this time for L leg DVT.   --Transitioned from heparin to [facility] with Eliquis 10mg  BID for 7 days followed by 5mg  BID from that point forward for DVT and h/o AFib. Continue to hold PTA ASA. --  Hypercoagulable work-up per IM given h/o breast cancer.    Hypertension -Suboptimally controlled at 139/101.  - Continue diuresis as above.  Added spironolactone and consider escalation as above.  Continue lisinopril, Toprol-XL, and Imdur.  Also consider addition of hydralazine but with consideration of Lasix and monitoring  of electrolytes.  --PTA medications include ibuprofen 200mg  q6h for pain. If possible, recommend stop taking this medication given the effects it might have on BP and cardiovascular health long term.  Hyperlipidemia -Transitioned from lovastatin 40 mg daily to Lipitor 40 mg daily given multivessel coronary artery calcification on CTA. --Recommend repeat lipid function now or as an outpatient to monitor.     For questions or updates, please contact [place] Please consult [email or web address] for contact info under        Signed, Arvil Chaco, PA-C  06/29/2018, 2:41 PM

## 2018-06-29 NOTE — Progress Notes (Signed)
Patient impulsive with getting out of bed. Removing telemetry leads.  Assisted to bathroom to void;DOE noted. Refuses to return to bed.  HR trending up and DOE while out of bed.  Pt out  in hallway in recliner to calm patient.

## 2018-06-29 NOTE — Progress Notes (Signed)
Pt measured and TED hose applied per MD note. I will continue to assess.

## 2018-06-29 NOTE — TOC Benefit Eligibility Note (Signed)
Transition of Care [facility]) Benefit Eligibility Note    Patient Details  Name: ARIANE DITULLIO MRN: [id number] Date of Birth: December 03, 1934   Medication/Dose: Eliquis 10mg  BID x 7 days, then 5mg  BID  Covered?: Yes  Prescription Coverage Preferred Pharmacy: [facility]  Spoke with Person/Company/Phone Number:: Seth Bake with [employer] at [phone]  Co-Pay: $47.00 estimated copay for 30 day supply & $141.00 estimated copay for 90 day supply  Prior Approval: No  Deductible: Unmet    Dannette Barbara  Phone Number: [phone] or [phone] 06/29/2018, 3:10 PM

## 2018-06-29 NOTE — Progress Notes (Signed)
[facility] NAME: Julia Gordon    MR#:  [id number]  DATE OF BIRTH:  [birth year]  SUBJECTIVE:  Patient without complaint, noted bilateral lower extremity swelling Out in the chair Grand-dter in the room  REVIEW OF SYSTEMS:    Review of Systems  Constitutional: Negative for chills, fever and weight loss.  HENT: Negative for ear discharge, ear pain and nosebleeds.   Eyes: Negative for blurred vision, pain and discharge.  Respiratory: Negative for sputum production, shortness of breath, wheezing and stridor.   Cardiovascular: Positive for leg swelling. Negative for chest pain, palpitations, orthopnea and PND.  Gastrointestinal: Negative for abdominal pain, diarrhea, nausea and vomiting.  Genitourinary: Negative for frequency and urgency.  Musculoskeletal: Negative for back pain and joint pain.  Neurological: Positive for weakness. Negative for sensory change, speech change and focal weakness.  Psychiatric/Behavioral: Negative for depression and hallucinations. The patient is not nervous/anxious.     DRUG ALLERGIES:   Allergies  Allergen Reactions  . No Known Allergies     VITALS:  Blood pressure 124/90, pulse 67, temperature 97.7 F (36.5 C), temperature source Oral, resp. rate 18, height 5\' 2"  (1.575 m), weight 75.1 kg, SpO2 100 %.  PHYSICAL EXAMINATION:  GENERAL:  84 y.o.-year-old patient lying in the bed with no acute distress.  Frail-appearing EYES: Pupils equal, round, reactive to light and accommodation. No scleral icterus. Extraocular muscles intact.  HEENT: Head atraumatic, normocephalic. Oropharynx and nasopharynx clear.  NECK:  Supple, no jugular venous distention. No thyroid enlargement, no tenderness.  LUNGS: Normal breath sounds bilaterally, no wheezing, rales,rhonchi or crepitation. No use of accessory muscles of respiration.  CARDIOVASCULAR: S1, S2 normal. No murmurs, rubs, or gallops.  ABDOMEN: Soft, nontender,  nondistended. Bowel sounds present. No organomegaly or mass.  EXTREMITIES: Bilateral lower extremity edema, no cyanosis, or clubbing.  NEUROLOGIC: Cranial nerves II through XII are intact. Muscle strength 5/5 in all extremities. Sensation intact. Gait not checked.  PSYCHIATRIC: The patient is alert and oriented x 3.  SKIN: No obvious rash, lesion, or ulcer.   Physical Exam LABORATORY PANEL:   CBC Recent Labs  Lab 06/29/18 0739  WBC 6.1  HGB 15.4*  HCT 47.7*  PLT 165   ------------------------------------------------------------------------------------------------------------------  Chemistries  Recent Labs  Lab 06/27/18 1931  06/28/18 1607 06/29/18 0739  NA 142   < >  --  139  K 3.4*   < >  --  3.5  CL 109   < >  --  106  CO2 26   < >  --  22  GLUCOSE 103*   < >  --  114*  BUN 26*   < >  --  24*  CREATININE 0.97   < >  --  0.98  CALCIUM 8.3*   < >  --  8.6*  MG  --   --  2.1  --   AST 37  --   --   --   ALT 36  --   --   --   ALKPHOS 35*  --   --   --   BILITOT 0.5  --   --   --    < > = values in this interval not displayed.   ------------------------------------------------------------------------------------------------------------------  Cardiac Enzymes Recent Labs  Lab 06/28/18 0657 06/28/18 1607  TROPONINI 0.04* 0.04*   ------------------------------------------------------------------------------------------------------------------  RADIOLOGY:  Dg Chest 2 View  Result Date: 06/27/2018 CLINICAL DATA:  84 year old female with acute  shortness of breath EXAM: CHEST - 2 VIEW COMPARISON:  07/19/2017 and prior radiographs FINDINGS: Cardiomegaly, LEFT ICD and RIGHT IJ Port-A-Cath with tip overlying the SUPERIOR cavoatrial junction again noted. There is no evidence of focal airspace disease, pulmonary edema, suspicious pulmonary nodule/mass, pleural effusion, or pneumothorax. No acute bony abnormalities are identified. IMPRESSION: Cardiomegaly without evidence of  acute cardiopulmonary disease. Electronically Signed   By: Margarette Canada M.D.   On: 06/27/2018 19:22   Ct Angio Chest Pe W And/or Wo Contrast  Result Date: 06/27/2018 CLINICAL DATA:  84 year old female with DVT and shortness of breath. EXAM: CT ANGIOGRAPHY CHEST WITH CONTRAST TECHNIQUE: Multidetector CT imaging of the chest was performed using the standard protocol during bolus administration of intravenous contrast. Multiplanar CT image reconstructions and MIPs were obtained to evaluate the vascular anatomy. CONTRAST:  69mL OMNIPAQUE IOHEXOL 350 MG/ML SOLN COMPARISON:  Chest radiograph dated 06/27/2018 FINDINGS: Cardiovascular: There is moderate cardiomegaly. No pericardial effusion. Advanced multi vessel coronary vascular calcification as well as calcification of the aortic root. Left pectoral pacemaker device is noted. There is moderate atherosclerotic calcification of the thoracic aorta. Evaluation of the aorta is limited due to non opacification and timing of the contrast. Curvilinear low density extending from the main pulmonary trunk into the right main pulmonary artery most consistent with mixing artifact. There is no CT evidence of pulmonary embolism. Right pectoral Port-A-Cath with tip close to the cavoatrial junction. Mediastinum/Nodes: No hilar or mediastinal adenopathy. Esophagus is grossly unremarkable. No mediastinal fluid collection. Lungs/Pleura: Trace bilateral pleural effusions. No pneumothorax. There is moderate centrilobular emphysema. No focal consolidation. The central airways are patent. Upper Abdomen: There is retrograde flow of contrast from the right atrium into the IVC consistent with right heart dysfunction. The visualized upper abdomen is otherwise unremarkable. Musculoskeletal: No chest wall abnormality. No acute or significant osseous findings. Review of the MIP images confirms the above findings. IMPRESSION: 1. No CT evidence of pulmonary embolism. 2. Moderate cardiomegaly with  advanced multi vessel coronary vascular calcification. 3. Trace bilateral pleural effusions. 4. Aortic Atherosclerosis (ICD10-I70.0) and Emphysema (ICD10-J43.9). Electronically Signed   By: Anner Crete M.D.   On: 06/27/2018 20:55   US Venous Img Lower Unilateral Left  Result Date: 06/27/2018 CLINICAL DATA:  84 year old female with LEFT LOWER extremity pain and swelling for 2 days. EXAM: LEFT LOWER EXTREMITY VENOUS DOPPLER ULTRASOUND TECHNIQUE: Gray-scale sonography with graded compression, as well as color Doppler and duplex ultrasound were performed to evaluate the lower extremity deep venous systems from the level of the common femoral vein and including the common femoral, femoral, profunda femoral, popliteal and calf veins including the posterior tibial, peroneal and gastrocnemius veins when visible. The superficial great saphenous vein was also interrogated. Spectral Doppler was utilized to evaluate flow at rest and with distal augmentation maneuvers in the common femoral, femoral and popliteal veins. COMPARISON:  None. FINDINGS: Occlusive DVT is noted within the LEFT popliteal and calf veins. No DVT is identified within the LEFT common femoral, profundus femoral or femoral veins. IMPRESSION: Occlusive LEFT popliteal and calf DVT. Critical Value/emergent results were called by telephone at the time of interpretation on 06/27/2018 at 7:25 pm to Dr. Lenise Arena , who verbally acknowledged these results. Electronically Signed   By: Margarette Canada M.D.   On: 06/27/2018 19:27   US Venous Img Lower Unilateral Right  Result Date: 06/28/2018 CLINICAL DATA:  Right lower extremity pain and edema. Imaging yesterday demonstrated left popliteal and calf vein DVT. EXAM: RIGHT LOWER EXTREMITY  VENOUS DOPPLER ULTRASOUND TECHNIQUE: Gray-scale sonography with graded compression, as well as color Doppler and duplex ultrasound were performed to evaluate the lower extremity deep venous systems from the level of the  common femoral vein and including the common femoral, femoral, profunda femoral, popliteal and calf veins including the posterior tibial, peroneal and gastrocnemius veins when visible. The superficial great saphenous vein was also interrogated. Spectral Doppler was utilized to evaluate flow at rest and with distal augmentation maneuvers in the common femoral, femoral and popliteal veins. COMPARISON:  Left lower extremity venous duplex ultrasound on 06/27/2018 FINDINGS: Contralateral Common Femoral Vein: Respiratory phasicity is normal and symmetric with the symptomatic side. No evidence of thrombus. Normal compressibility. Common Femoral Vein: No evidence of thrombus. Normal compressibility, respiratory phasicity and response to augmentation. Saphenofemoral Junction: No evidence of thrombus. Normal compressibility and flow on color Doppler imaging. Profunda Femoral Vein: No evidence of thrombus. Normal compressibility and flow on color Doppler imaging. Femoral Vein: No evidence of thrombus. Normal compressibility, respiratory phasicity and response to augmentation. Popliteal Vein: No evidence of thrombus. Normal compressibility, respiratory phasicity and response to augmentation. Calf Veins: There is thrombus isolated to the right peroneal vein. Other visualized calf veins are normally patent. Superficial Great Saphenous Vein: No evidence of thrombus. Normal compressibility. Venous Reflux:  None. Other Findings: No evidence of superficial thrombophlebitis or abnormal fluid collection. IMPRESSION: Right peroneal vein DVT. Electronically Signed   By: Aletta Edouard M.D.   On: 06/28/2018 12:22    ASSESSMENT AND PLAN:  *Bilateral LEt DVT  -Noted occlusive left popliteal vein on left lower extremity and right peroneal vein DVT -Continue heparin drip--changed to po eliquis now per  vascular surgery recommendaiton  *Acute on chronic systolic CHF Resolving Continue congestive heart failure protocol, IV Lasix,  strict I&O monitoring, daily weights, EF 25-30%  By echo -cont imdur, BB and lisinopril  *elevated troponins  secondary to congestive heart failure/demand ischemia  -pt denies any cp  *Chronic benign essential hypertension Stable on current regiment  *History of breast cancer Stable Tamoxifen continued  *Acute hypokalemia Replete as needed, check magnesium level, BMP in the morning  DVT prophylaxis-on elquis  Disposition Home in 1-2 days barring any complications  PT consult places Pt wishes to go home     CODE STATUS: full  TOTAL TIME TAKING CARE OF THIS PATIENT: 35 minutes.    POSSIBLE D/C IN 1-2 DAYS, DEPENDING ON CLINICAL CONDITION.   Fritzi Mandes M.D on 06/29/2018   Between 7am to 6pm - Pager - [phone]  After 6pm go to [email or web address] - password [email or web address] [facility]  Office  [phone]  CC: Primary care physician; Ellamae Sia, MD  Note: This dictation was prepared with Dragon dictation along with smaller phrase technology. Any transcriptional errors that result from this process are unintentional.

## 2018-06-30 ENCOUNTER — Telehealth: Payer: Self-pay | Admitting: *Deleted

## 2018-06-30 DIAGNOSIS — I82432 Acute embolism and thrombosis of left popliteal vein: Principal | ICD-10-CM

## 2018-06-30 DIAGNOSIS — I4819 Other persistent atrial fibrillation: Secondary | ICD-10-CM

## 2018-06-30 MED ORDER — FUROSEMIDE 40 MG PO TABS: 40.0000 mg | ORAL_TABLET | Freq: Every day | ORAL | Status: DC

## 2018-06-30 MED ORDER — FUROSEMIDE 40 MG PO TABS: 40.0000 mg | ORAL_TABLET | Freq: Every day | ORAL | 1 refills | Status: DC

## 2018-06-30 MED ORDER — APIXABAN 5 MG PO TABS: 10.0000 mg | ORAL_TABLET | Freq: Two times a day (BID) | ORAL | 2 refills | Status: DC

## 2018-06-30 NOTE — Telephone Encounter (Signed)
-----   Message from Rise Mu, PA-C sent at 06/30/2018  9:26 AM EDT ----- Patient will need new patient appointment with Dr. Caryl Comes. ICD placed at [facility] in 2009, seen by [facility] in 2014, lost to follow up since. ERI 3 years.

## 2018-06-30 NOTE — Care Management Important Message (Signed)
Important Message  Patient Details  Name: Julia Gordon MRN: [id number] Date of Birth: December 29, 1934   Medicare Important Message Given:  Yes     Dannette Barbara 06/30/2018, 11:23 AM

## 2018-06-30 NOTE — Progress Notes (Signed)
Patient was discharged home as per order, all discharge instruction reviewed with patient daughter  [name] walker .

## 2018-06-30 NOTE — Progress Notes (Signed)
Progress Note  Patient Name: Julia Gordon Date of Encounter: 06/30/2018  Primary Cardiologist: previously [facility] (last seen in 2009), new to [facility] - consult by End  Subjective   Feels well this morning. No chest pain, SOB, or palpitations. She feels like her breathing is at baseline. Swelling of the left lower extremity has improved. Tolerating [place] for her left lower extremity DVT. Renal function has remained stable with IV diuresis. Documented UOP of 1.8 L for the admission (no documented input). No recent weights. Wants to go home.   Inpatient Medications    Scheduled Meds: . apixaban  10 mg Oral BID   Followed by  . [START ON 07/06/2018] apixaban  5 mg Oral BID  . atorvastatin  40 mg Oral q1800  . [START ON 07/01/2018] furosemide  40 mg Oral Daily  . isosorbide mononitrate  30 mg Oral Daily  . lisinopril  20 mg Oral Daily  . metoprolol succinate  50 mg Oral Daily  . pantoprazole  40 mg Oral Daily  . sodium chloride flush  10-40 mL Intracatheter Q12H  . spironolactone  25 mg Oral Daily  . tamoxifen  20 mg Oral Daily   Continuous Infusions:  PRN Meds: acetaminophen **OR** acetaminophen, hydrALAZINE, ondansetron **OR** ondansetron (ZOFRAN) IV, polyethylene glycol, sodium chloride flush, traZODone   Vital Signs    Vitals:   06/29/18 2007 06/29/18 2009 06/30/18 0440 06/30/18 0727  BP: (!) 146/107 (!) 132/95 (!) 141/95 117/72  Pulse:  72 78 65  Resp:  20    Temp: 97.9 F (36.6 C)  97.8 F (36.6 C) 97.7 F (36.5 C)  TempSrc: Oral  Oral Oral  SpO2:  100% 99% 100%  Weight:      Height:        Intake/Output Summary (Last 24 hours) at 06/30/2018 0910 Last data filed at 06/30/2018 9381 Gross per 24 hour  Intake -  Output 850 ml  Net -850 ml   Filed Weights   06/27/18 1824 06/28/18 0912  Weight: 72.6 kg 75.1 kg    Telemetry    SR, occasional PVCs, rare ventricular couplets, ventricular bigeminy and trigeminy noted - Personally Reviewed  ECG    n/a - Personally  Reviewed  Physical Exam   GEN: No acute distress.   Neck: No JVD. Cardiac: RRR, I/VI systolic murmur, no rubs, or gallops.  Respiratory: Clear to auscultation bilaterally.  GI: Soft, nontender, non-distended.   MS: Improved swelling of the left lower extremity with compression hose noted; No deformity. Neuro:  Alert and oriented x 3; Nonfocal.  Psych: Normal affect.  Labs    Chemistry Recent Labs  Lab 06/27/18 1931 06/28/18 0657 06/29/18 0739  NA 142 141 139  K 3.4* 3.3* 3.5  CL 109 109 106  CO2 26 22 22   GLUCOSE 103* 107* 114*  BUN 26* 23 24*  CREATININE 0.97 0.84 0.98  CALCIUM 8.3* 8.5* 8.6*  PROT 5.3*  --   --   ALBUMIN 3.1*  --   --   AST 37  --   --   ALT 36  --   --   ALKPHOS 35*  --   --   BILITOT 0.5  --   --   GFRNONAA 55* >60 54*  GFRAA >60 >60 >60  ANIONGAP 7 10 11      Hematology Recent Labs  Lab 06/27/18 1931 06/28/18 0657 06/29/18 0739  WBC 5.4 5.6 6.1  RBC 4.21 4.57 4.95  HGB 13.2 14.1 15.4*  HCT 40.8 43.4 47.7*  MCV 96.9 95.0 96.4  MCH 31.4 30.9 31.1  MCHC 32.4 32.5 32.3  RDW 15.5 15.5 15.3  PLT 152 145* 165    Cardiac Enzymes Recent Labs  Lab 06/27/18 1931 06/28/18 0657 06/28/18 1607  TROPONINI 0.04* 0.04* 0.04*   No results for input(s): TROPIPOC in the last 168 hours.   BNP Recent Labs  Lab 06/27/18 1931  BNP 2,183.0*     DDimer No results for input(s): DDIMER in the last 168 hours.   Radiology    US Venous Img Lower Unilateral Right  Result Date: 06/28/2018 IMPRESSION: Right peroneal vein DVT. Electronically Signed   By: Aletta Edouard M.D.   On: 06/28/2018 12:22    Cardiac Studies   2D Echo 06/28/2018: 1. The left ventricle has severely reduced systolic function, with an ejection fraction of 25-30%. The cavity size was mild to moderately dilated. Left ventricular diastolic Doppler parameters are consistent with pseudonormalization. Left ventricular diffuse hypokinesis.  2. The right ventricle has mildly reduced  systolic function. The cavity was normal. There is no increase in right ventricular wall thickness. Right ventricular systolic pressure is severely elevated with an estimated pressure of 71.0 mmHg.  3. Right atrial size was mildly dilated.  4. The mitral valve is degenerative. Mild thickening of the mitral valve leaflet. There is mild mitral annular calcification present. Mitral valve regurgitation is mild to moderate by color flow Doppler.  5. The tricuspid valve is not well visualized. Tricuspid valve regurgitation is moderate.  6. The aortic valve is tricuspid. Mild thickening of the aortic valve. Mild calcification of the aortic valve. Aortic valve regurgitation is mild by color flow Doppler.  7. The aortic root is normal in size and structure.  8. The inferior vena cava was dilated in size with <50% respiratory variability.  9. The interatrial septum was not well visualized. __________  Device interrogation 06/28/2018: Patient has single chamber ICD.  Mode is VVI 40.  No recent therapy or shocks.  Device has 3 years of longevity remaining.  Patient Profile     84 y.o. female with history of HFrEF secondary to longstanding NICM s/p [employer] ICD in 2009 through [place], LV mural thrombus in 2009, persistent Afib not on [place], left-sided breast cancer status post mastectomy and chemoradiation, HTN, and HLD who was admitted with a left lower extremity DVT and was seen by cardiology for the evaluation of CHF.  Assessment & Plan    1. HFrEF secondary to presumed NICM s/p [employer] ICD: -She appears grossly euvolemic  -Transition from IV Lasix to PO Lasix 40 mg daily (previously on 20 mg daily) -Continue Toprol XL, lisinopril, and spironolactone -In follow up, consider transition of ACEi to [facility] following 36 hour ACEi washout -She will need to establish with EP (message sent to scheduling) -CHF education  2. Multivessel coronary artery calcification with mildly elevated  troponin: -No symptoms of angina -Troponin minimally elevated and flat trending, not consistent with ACS -Consider outpatient ischemic evaluation -Lovastatin changed to Lipitor as below -ASA held given initiation of Eliquis as below -Toprol XL and Imdur  3. Persistent Afib: -Maintaining sinus rhythm while admitted -CHADS2VASc at least 6 (CHF, HTN, age x 2, vascular disease, female) -Eliquis as below  -Continue Toprol XL -Follow up with EP  4. Left lower extremity DVT:- -Tolerating DVT-dosed Eliquis -ASA held given initiation of Eliquis for DVT -Hypercoagulable workup per IM, especially given her history of breast cancer. If not completed inpatient, this  will need to be followed up by PCP or she will require referral to hematology   5. HTN: -Blood pressure is well controlled this morning -Continue current medications as outlined above  6. HLD: -Transitioned from lovastatin to Lipitor this admission with coronary artery calcium noted on imaging -Follow up lipid panel as an outpatient   7. Hypokalemia: -Improved -Continue lisinopril and spironolactone as above  8. PVCs: -Asymptomatic  -Magnesium at goal -Potassium improving -TSH normal -Continue beta blocker as above -Follow up with EP -Consider outpatient ischemic workup    For questions or updates, please contact [facility] Please consult [email or web address] for contact info under Cardiology/STEMI.    Signed, Christell Faith, PA-C [facility] Pager: [phone] 06/30/2018, 9:10 AM

## 2018-06-30 NOTE — Discharge Summary (Signed)
[place] [facility] [place] NAME: Julia Gordon    MR#:  [id number]  DATE OF BIRTH:  1934/12/06  DATE OF ADMISSION:  06/27/2018 ADMITTING PHYSICIAN: Christel Mormon, MD  DATE OF DISCHARGE:   PRIMARY CARE PHYSICIAN: Ellamae Sia, MD    ADMISSION DIAGNOSIS:  Shortness of breath [R06.02] Acute deep vein thrombosis (DVT) of left lower extremity, unspecified vein (HCC) [I82.402]  DISCHARGE DIAGNOSIS:  Acute Bilateral LE DVT --on po eliquis Acute on Chronic Systolic CHF  SECONDARY DIAGNOSIS:   Past Medical History:  Diagnosis Date  . Anemia   . Breast cancer ([place]) 2015   a. L mastectomy with chemoradiation  . Colon adenocarcinoma ([place])   . COPD (chronic obstructive pulmonary disease) ([place])   . Depression   . DJD (degenerative joint disease) of knee    right knee  . H/O hysterectomy with oophorectomy    for DUB  . History of depression   . HLD (hyperlipidemia)   . HTN (hypertension)   . LV (left ventricular) mural thrombus    history of, resolved (echo 04/09)  . NICM (nonischemic cardiomyopathy) ([place])    a. status post [place] with [employer] lead in 2009 at [facility], Dr. Boyd Kerbs  . Persistent atrial fibrillation    a. noted in Care Everywhere from 2008-2009; b. CHADS2VASc 6 (CHF, HTN, age x 2, vascular disease, female)    HOSPITAL COURSE:   *Bilateral LE DVT  -Noted occlusive left popliteal vein on left lower extremity and right peroneal vein DVT -Continue heparin drip--changed to po eliquis now per  vascular surgery recommendation  *Acute on chronic systolic CHF improving Continue congestive heart failure protocol, po Lasix, strict I&O monitoring, daily weights, EF 25-30%  By echo -cont imdur, BB and lisinopril -seen by Dr End  *elevated troponins  secondary to congestive heart failure/demand ischemia  -pt denies any cp  *benign essentialhypertension Stable on current regimen  *History of breast cancer   cont Tamoxifen  *Acute hypokalemia -K 3.5  DVT prophylaxis-on eliquis  Overall doing well. D/c home dter aware  CONSULTS OBTAINED:  Treatment Team:  Nelva Bush, MD Lucky Cowboy Erskine Squibb, MD Fritzi Mandes, MD  DRUG ALLERGIES:   Allergies  Allergen Reactions  . No Known Allergies     DISCHARGE MEDICATIONS:   Allergies as of 06/30/2018      Reactions   No Known Allergies       Medication List    STOP taking these medications   enoxaparin 40 MG/0.4ML injection Commonly known as: LOVENOX   ibuprofen 200 MG tablet Commonly known as: ADVIL     TAKE these medications   alendronate 70 MG tablet Commonly known as: FOSAMAX TAKE 1 TAB WEEKLY 30 MINS BEFORE BREAKFAST WITH A FULL GLASS OF WATER. DO NOT LIE DOWN FOR 30 MINS   apixaban 5 MG Tabs tablet Commonly known as: ELIQUIS Take 2 tablets (10 mg total) by mouth 2 (two) times daily. Take 2 tabs (10 mg) bid till 6/29 and then 1 tab (5 mg) bid from 07/06/2018   aspirin 81 MG tablet Take 81 mg by mouth daily.   furosemide 40 MG tablet Commonly known as: LASIX Take 1 tablet (40 mg total) by mouth daily. Start taking on: July 01, 2018 What changed:   how much to take  when to take this  reasons to take this  additional instructions   isosorbide mononitrate 30 MG 24 hr tablet Commonly known as: IMDUR Take  1 tablet by mouth Daily.   Klor-Con M20 20 MEQ tablet Generic drug: potassium chloride SA Take 1 tablet by mouth Daily.   lisinopril 20 MG tablet Commonly known as: ZESTRIL Take 20 mg by mouth daily.   lovastatin 40 MG tablet Commonly known as: MEVACOR Take 2 tablets by mouth Daily.   metoprolol succinate 50 MG 24 hr tablet Commonly known as: TOPROL-XL Take 50 mg by mouth daily.   MULTIPLE VITAMIN PO Take 1 tablet by mouth daily.   spironolactone 50 MG tablet Commonly known as: ALDACTONE Take 50 mg by mouth daily.   tamoxifen 20 MG tablet Commonly known as: NOLVADEX Take 20 mg by mouth  daily.   traMADol 50 MG tablet Commonly known as: ULTRAM Take 1 tablet (50 mg total) by mouth every 8 (eight) hours as needed for severe pain.       If you experience worsening of your admission symptoms, develop shortness of breath, life threatening emergency, suicidal or homicidal thoughts you must seek medical attention immediately by calling 911 or calling your MD immediately  if symptoms less severe.  You Must read complete instructions/literature along with all the possible adverse reactions/side effects for all the Medicines you take and that have been prescribed to you. Take any new Medicines after you have completely understood and accept all the possible adverse reactions/side effects.   Please note  You were cared for by a hospitalist during your hospital stay. If you have any questions about your discharge medications or the care you received while you were in the hospital after you are discharged, you can call the unit and asked to speak with the hospitalist on call if the hospitalist that took care of you is not available. Once you are discharged, your primary care physician will handle any further medical issues. Please note that NO REFILLS for any discharge medications will be authorized once you are discharged, as it is imperative that you return to your primary care physician (or establish a relationship with a primary care physician if you do not have one) for your aftercare needs so that they can reassess your need for medications and monitor your lab values. Today   SUBJECTIVE  I am ready to go   VITAL SIGNS:  Blood pressure 117/72, pulse 65, temperature 97.7 F (36.5 C), temperature source Oral, resp. rate 20, height 5\' 2"  (1.575 m), weight 75.1 kg, SpO2 100 %.  I/O:    Intake/Output Summary (Last 24 hours) at 06/30/2018 0955 Last data filed at 06/30/2018 9798 Gross per 24 hour  Intake -  Output 850 ml  Net -850 ml    PHYSICAL EXAMINATION:  GENERAL:  84  y.o.-year-old patient lying in the bed with no acute distress.  EYES: Pupils equal, round, reactive to light and accommodation. No scleral icterus. Extraocular muscles intact.  HEENT: Head atraumatic, normocephalic. Oropharynx and nasopharynx clear.  NECK:  Supple, no jugular venous distention. No thyroid enlargement, no tenderness.  LUNGS: Normal breath sounds bilaterally, no wheezing, rales,rhonchi or crepitation. No use of accessory muscles of respiration.  CARDIOVASCULAR: S1, S2 normal. No murmurs, rubs, or gallops.  ABDOMEN: Soft, non-tender, non-distended. Bowel sounds present. No organomegaly or mass.  EXTREMITIES: + pedal edema,no cyanosis, or clubbing.  NEUROLOGIC: Cranial nerves II through XII are intact. Muscle strength 5/5 in all extremities. Sensation intact. Gait not checked.  PSYCHIATRIC: The patient is alert and oriented x 3.  SKIN: No obvious rash, lesion, or ulcer.   DATA REVIEW:   CBC  Recent Labs  Lab 06/29/18 0739  WBC 6.1  HGB 15.4*  HCT 47.7*  PLT 165    Chemistries  Recent Labs  Lab 06/27/18 1931  06/28/18 1607 06/29/18 0739  NA 142   < >  --  139  K 3.4*   < >  --  3.5  CL 109   < >  --  106  CO2 26   < >  --  22  GLUCOSE 103*   < >  --  114*  BUN 26*   < >  --  24*  CREATININE 0.97   < >  --  0.98  CALCIUM 8.3*   < >  --  8.6*  MG  --   --  2.1  --   AST 37  --   --   --   ALT 36  --   --   --   ALKPHOS 35*  --   --   --   BILITOT 0.5  --   --   --    < > = values in this interval not displayed.    Microbiology Results   Recent Results (from the past 240 hour(s))  SARS Coronavirus 2 (CEPHEID - Performed in [place] hospital lab), Hosp Order     Status: None   Collection Time: 06/27/18  9:18 PM   Specimen: Nasopharyngeal Swab  Result Value Ref Range Status   SARS Coronavirus 2 NEGATIVE NEGATIVE Final    Comment: (NOTE) If result is NEGATIVE SARS-CoV-2 target nucleic acids are NOT DETECTED. The SARS-CoV-2 RNA is generally detectable  in upper and lower  respiratory specimens during the acute phase of infection. The lowest  concentration of SARS-CoV-2 viral copies this assay can detect is 250  copies / mL. A negative result does not preclude SARS-CoV-2 infection  and should not be used as the sole basis for treatment or other  patient management decisions.  A negative result may occur with  improper specimen collection / handling, submission of specimen other  than nasopharyngeal swab, presence of viral mutation(s) within the  areas targeted by this assay, and inadequate number of viral copies  (<250 copies / mL). A negative result must be combined with clinical  observations, patient history, and epidemiological information. If result is POSITIVE SARS-CoV-2 target nucleic acids are DETECTED. The SARS-CoV-2 RNA is generally detectable in upper and lower  respiratory specimens dur ing the acute phase of infection.  Positive  results are indicative of active infection with SARS-CoV-2.  Clinical  correlation with patient history and other diagnostic information is  necessary to determine patient infection status.  Positive results do  not rule out bacterial infection or co-infection with other viruses. If result is PRESUMPTIVE POSTIVE SARS-CoV-2 nucleic acids MAY BE PRESENT.   A presumptive positive result was obtained on the submitted specimen  and confirmed on repeat testing.  While 2019 novel coronavirus  (SARS-CoV-2) nucleic acids may be present in the submitted sample  additional confirmatory testing may be necessary for epidemiological  and / or clinical management purposes  to differentiate between  SARS-CoV-2 and other Sarbecovirus currently known to infect humans.  If clinically indicated additional testing with an alternate test  methodology [phone]) is advised. The SARS-CoV-2 RNA is generally  detectable in upper and lower respiratory sp ecimens during the acute  phase of infection. The expected result is  Negative. Fact Sheet for Patients:  StrictlyIdeas.no Fact Sheet for Healthcare Providers: BankingDealers.co.za This test is  not yet approved or cleared by the [place] and has been authorized for detection and/or diagnosis of SARS-CoV-2 by FDA under an Emergency Use Authorization (EUA).  This EUA will remain in effect (meaning this test can be used) for the duration of the COVID-19 declaration under Section 564(b)(1) of the Act, 21 U.S.C. section 360bbb-3(b)(1), unless the authorization is terminated or revoked sooner. Performed at [facility], [address] [place], [place] [address]     RADIOLOGY:  US Venous Img Lower Unilateral Right  Result Date: 06/28/2018 CLINICAL DATA:  Right lower extremity pain and edema. Imaging yesterday demonstrated left popliteal and calf vein DVT. EXAM: RIGHT LOWER EXTREMITY VENOUS DOPPLER ULTRASOUND TECHNIQUE: Gray-scale sonography with graded compression, as well as color Doppler and duplex ultrasound were performed to evaluate the lower extremity deep venous systems from the level of the common femoral vein and including the common femoral, femoral, profunda femoral, popliteal and calf veins including the posterior tibial, peroneal and gastrocnemius veins when visible. The superficial great saphenous vein was also interrogated. Spectral Doppler was utilized to evaluate flow at rest and with distal augmentation maneuvers in the common femoral, femoral and popliteal veins. COMPARISON:  Left lower extremity venous duplex ultrasound on 06/27/2018 FINDINGS: Contralateral Common Femoral Vein: Respiratory phasicity is normal and symmetric with the symptomatic side. No evidence of thrombus. Normal compressibility. Common Femoral Vein: No evidence of thrombus. Normal compressibility, respiratory phasicity and response to augmentation. Saphenofemoral Junction: No evidence of thrombus. Normal compressibility  and flow on color Doppler imaging. Profunda Femoral Vein: No evidence of thrombus. Normal compressibility and flow on color Doppler imaging. Femoral Vein: No evidence of thrombus. Normal compressibility, respiratory phasicity and response to augmentation. Popliteal Vein: No evidence of thrombus. Normal compressibility, respiratory phasicity and response to augmentation. Calf Veins: There is thrombus isolated to the right peroneal vein. Other visualized calf veins are normally patent. Superficial Great Saphenous Vein: No evidence of thrombus. Normal compressibility. Venous Reflux:  None. Other Findings: No evidence of superficial thrombophlebitis or abnormal fluid collection. IMPRESSION: Right peroneal vein DVT. Electronically Signed   By: Aletta Edouard M.D.   On: 06/28/2018 12:22     CODE STATUS:     Code Status Orders  (From admission, onward)         Start     Ordered   06/27/18 2304  Full code  Continuous     06/27/18 2303        Code Status History    Date Active Date Inactive Code Status Order ID Comments User Context   07/20/2017 0231 07/23/2017 1926 Full Code [id number]  Lance Coon, MD Inpatient   Advance Care Planning Activity      TOTAL TIME TAKING CARE OF THIS PATIENT: *40* minutes.    Fritzi Mandes M.D on 06/30/2018 at 9:55 AM  Between 7am to 6pm - Pager - [phone] After 6pm go to [email or web address] - password [email or web address] [facility] [place] [facility]  Office  [phone]  CC: Primary care physician; Ellamae Sia, MD

## 2018-06-30 NOTE — TOC Transition Note (Deleted)
Transition of Care [facility]) - CM/SW Discharge Note   Patient Details  Name: Julia Gordon MRN: [id number] Date of Birth: October 01, 1934  Transition of Care [facility]) CM/SW Contact:  Elza Rafter, RN Phone Number: 06/30/2018, 10:15 AM   Clinical Narrative:       Final next level of care: Home w Home Health Services Barriers to Discharge: No Barriers Identified   Patient Goals and CMS Choice Patient states their goals for this hospitalization and ongoing recovery are:: go home and take care of her great grand son CMS Medicare.gov Compare Post Acute Care list provided to:: Patient Choice offered to / list presented to : Patient  Discharge Placement                       Discharge Plan and Services   Discharge Planning Services: CM Consult Post Acute Care Choice: Home Health                    HH Arranged: RN, PT          Social Determinants of Health (SDOH) Interventions     Readmission Risk Interventions No flowsheet data found.

## 2018-06-30 NOTE — TOC Transition Note (Signed)
Transition of Care [facility]) - CM/SW Discharge Note   Patient Details  Name: Julia Gordon MRN: [id number] Date of Birth: [birth year]  Transition of Care [facility]) CM/SW Contact:  Elza Rafter, RN Phone Number: 06/30/2018, 10:15 AM   Clinical Narrative:   Patient is discharging to home today.  Daughter will transport home.  Patient lives at home with her grand children.  She is current with PCP; obtains medications at [facility] without difficulty.  Starting on Eliquis-30 day free coupon given to patient and co-pay will be $47/mo.  She states she will not be able to afford that co-pay.  Notified MD and gave patient Eliquis assistance application.  She uses a cane and a walker at home.  She does not have issues with housing or transportation.  Home health referral made to [facility] with [place] for RN and PT.    Final next level of care: Home w Home Health Services Barriers to Discharge: No Barriers Identified   Patient Goals and CMS Choice Patient states their goals for this hospitalization and ongoing recovery are:: go home and take care of her great grand son CMS Medicare.gov Compare Post Acute Care list provided to:: Patient Choice offered to / list presented to : Patient  Discharge Placement                       Discharge Plan and Services   Discharge Planning Services: CM Consult Post Acute Care Choice: Home Health                    HH Arranged: RN, PT          Social Determinants of Health (SDOH) Interventions     Readmission Risk Interventions No flowsheet data found.

## 2018-07-05 NOTE — Telephone Encounter (Signed)
No ans no vm   °

## 2018-07-08 ENCOUNTER — Ambulatory Visit (INDEPENDENT_AMBULATORY_CARE_PROVIDER_SITE_OTHER): Payer: Medicare Other | Admitting: Nurse Practitioner

## 2018-07-08 ENCOUNTER — Telehealth: Payer: Self-pay

## 2018-07-08 ENCOUNTER — Encounter: Payer: Self-pay | Admitting: Nurse Practitioner

## 2018-07-08 ENCOUNTER — Other Ambulatory Visit: Payer: Self-pay

## 2018-07-08 VITALS — BP 128/88 | HR 70 | Ht 62.0 in | Wt 157.8 lb

## 2018-07-08 DIAGNOSIS — I824Y2 Acute embolism and thrombosis of unspecified deep veins of left proximal lower extremity: Secondary | ICD-10-CM

## 2018-07-08 DIAGNOSIS — I10 Essential (primary) hypertension: Secondary | ICD-10-CM

## 2018-07-08 DIAGNOSIS — I428 Other cardiomyopathies: Secondary | ICD-10-CM | POA: Diagnosis not present

## 2018-07-08 DIAGNOSIS — I480 Paroxysmal atrial fibrillation: Secondary | ICD-10-CM

## 2018-07-08 DIAGNOSIS — Z95810 Presence of automatic (implantable) cardiac defibrillator: Secondary | ICD-10-CM

## 2018-07-08 DIAGNOSIS — I5022 Chronic systolic (congestive) heart failure: Secondary | ICD-10-CM

## 2018-07-08 DIAGNOSIS — E782 Mixed hyperlipidemia: Secondary | ICD-10-CM

## 2018-07-08 DIAGNOSIS — I4819 Other persistent atrial fibrillation: Secondary | ICD-10-CM | POA: Diagnosis not present

## 2018-07-08 MED ORDER — APIXABAN 5 MG PO TABS: 5.0000 mg | ORAL_TABLET | Freq: Two times a day (BID) | ORAL | 3 refills | Status: DC

## 2018-07-08 NOTE — Progress Notes (Signed)
Office Visit    Patient Name: Julia Gordon Date of Encounter: 07/08/2018  Primary Care Provider:  Ellamae Sia, MD Primary Cardiologist:  Nelva Bush, MD  Chief Complaint    84 year old female with a history of nonischemic cardiomyopathy and HFrEF status post AICD, left ventricular mural thrombus (2009), persistent atrial fibrillation, bilateral breast cancer, hypertension, hyperlipidemia, and nonsustained ventricular tachycardia, who presents for follow-up after recent hospitalization secondary to acute on chronic heart failure and left lower extremity DVT with initiation of Eliquis.  Past Medical History    Past Medical History:  Diagnosis Date   Anemia    Breast cancer ([place]) 2015   a. L mastectomy with chemoradiation   Colon adenocarcinoma (HCC)    COPD (chronic obstructive pulmonary disease) (HCC)    Depression    DJD (degenerative joint disease) of knee    right knee   H/O hysterectomy with oophorectomy    for DUB   HFrEF (heart failure with reduced ejection fraction) ([place])    a. 06/2018 Echo: EF 25-30%.   History of depression    HLD (hyperlipidemia)    HTN (hypertension)    LV (left ventricular) mural thrombus    history of, resolved (echo 04/09)   NICM (nonischemic cardiomyopathy) ([place])    a. Reports prior h/o cath @ [facility]; b. status post [place] with [employer]ant lead in 2009 at [facility], Dr. Boyd Kerbs; c. 2014 - prev seen by G. Lovena Le, MD; c. 06/2018 Echo: EF 25-30%, DD. Diff HK. RVSP 20mmHg. Mildly dil RA. Mild to mod MR. Mod TR. Mild AI.   Persistent atrial fibrillation    a. noted in Care Everywhere from 2008-2009; b. CHADS2VASc 6 (CHF, HTN, age x 2, vascular disease, female)-->eliquis added 06/2018 in setting of LLE DVT.   Valvular heart disease    a. 06/2018 Echo: Mod TR, mild to omd MR, mild AI.   Past Surgical History:  Procedure Laterality Date   ABDOMINAL HYSTERECTOMY     BREAST BIOPSY Left 05/10/13   [place] bx-positive   COLONOSCOPY  WITH PROPOFOL N/A 01/15/2017   Procedure: COLONOSCOPY WITH PROPOFOL;  Surgeon: Jonathon Bellows, MD;  [facility] ENDOSCOPY;  Service: Gastroenterology;  Laterality: N/A;   LAPAROSCOPIC RIGHT COLON RESECTION  08/10   Dr Pat Patrick, for adenoca    MASTECTOMY Left 2015   BREAST CA   OOPHORECTOMY     ORIF FEMUR FRACTURE Right 07/21/2017   Procedure: OPEN REDUCTION INTERNAL FIXATION (ORIF) DISTAL FEMUR FRACTURE;  Surgeon: Lovell Sheehan, MD;  Location: [facility] ORS;  Service: Orthopedics;  Laterality: Right;  femur    PACEMAKER INSERTION  2009   REPLACEMENT TOTAL KNEE  1990's   right     Allergies  Allergies  Allergen Reactions   No Known Allergies     History of Present Illness    84 year old female with the above complex past medical history including nonischemic cardiomyopathy and HFrEF status post [place] implanted in 2009, LV thrombus in 2009, persistent A. fib previously not on anticoagulation, nonsustained ventricular tachycardia, hypertension, and hyperlipidemia.  Review of records shows that she developed atrial fibrillation in 2008 through 2009 and was initially followed locally but then later at [facility].  Dr. Lovena Le from our group followed her briefly in 2014 and she has subsequently been lost to follow-up.  She was admitted to [facility] on June 21 with a 3-week history of left lower extremity swelling, erythema, warmth, and pain.  She was found to have a left  lower extremity DVT.  CTA of the chest was negative for PE.  She was also volume overloaded and required significant diuresis.  She responded well to diuresis and was discharged home on June 24 on Lasix 40 mg daily.  Though she was reluctant to start anticoagulation in the past in the setting of atrial fibrillation, she did accept a prescription for Eliquis 10 mg twice daily times 7 days followed by 5 mg twice daily.  Since her discharge, she has done reasonably well.  Her left leg is still swollen but not  uncomfortable.  She notes good response to Lasix with frequent urination.  She has some degree of chronic dyspnea but this has been stable.  She denies chest pain, palpitations, PND, orthopnea, dizziness, syncope, or early satiety.  Of note, she reports that she is currently taking Eliquis 5 mg once a day instead of twice a day.  She is not sure if she took 10 mg twice daily before or if she took it once a day only.  Home Medications    Prior to Admission medications   Medication Sig Start Date End Date Taking? Authorizing Provider  alendronate (FOSAMAX) 70 MG tablet TAKE 1 TAB WEEKLY 30 MINS BEFORE BREAKFAST WITH A FULL GLASS OF WATER. DO NOT LIE DOWN FOR 30 MINS 10/25/17  Yes Finnegan, Kathlene November, MD  apixaban (ELIQUIS) 5 MG TABS tablet Take 2 tablets (10 mg total) by mouth 2 (two) times daily. Take 2 tabs (10 mg) bid till 6/29 and then 1 tab (5 mg) bid from 07/06/2018 06/30/18  Yes Fritzi Mandes, MD  aspirin 81 MG tablet Take 81 mg by mouth daily.   Yes [provider]  furosemide (LASIX) 40 MG tablet Take 1 tablet (40 mg total) by mouth daily. 07/01/18  Yes Fritzi Mandes, MD  isosorbide mononitrate (IMDUR) 30 MG 24 hr tablet Take 1 tablet by mouth Daily. 01/31/11  Yes [provider]  KLOR-CON M20 20 MEQ tablet Take 1 tablet by mouth Daily. 03/03/11  Yes [provider]  lisinopril (PRINIVIL,ZESTRIL) 20 MG tablet Take 20 mg by mouth daily.   Yes [provider]  lovastatin (MEVACOR) 40 MG tablet Take 2 tablets by mouth Daily. 01/31/11  Yes [provider]  metoprolol succinate (TOPROL-XL) 50 MG 24 hr tablet Take 50 mg by mouth daily.  07/11/14  Yes [provider]  MULTIPLE VITAMIN PO Take 1 tablet by mouth daily.    Yes [provider]  spironolactone (ALDACTONE) 50 MG tablet Take 50 mg by mouth daily.   Yes [provider]  tamoxifen (NOLVADEX) 20 MG tablet Take 20 mg by mouth daily.   Yes [provider]  traMADol (ULTRAM)  50 MG tablet Take 1 tablet (50 mg total) by mouth every 8 (eight) hours as needed for severe pain. 07/23/17  Yes Fritzi Mandes, MD    Review of Systems    Left lower extremity swelling in the setting of recent DVT.  She has some degree of chronic dyspnea on exertion which is unchanged.  She denies chest pain, palpitations, PND, orthopnea, dizziness, syncope, or early satiety.  All other systems reviewed and are otherwise negative except as noted above.  Physical Exam    VS:  BP 128/88 (BP Location: Right Arm, Patient Position: Sitting, Cuff Size: Normal)    Pulse 70    Ht 5\' 2"  (1.575 m)    Wt 157 lb 12 oz (71.6 kg)    BMI 28.85 kg/m  ,  BMI Body mass index is 28.85 kg/m. GEN: Somewhat frail, in no acute distress. HEENT: normal. Neck: Supple, no JVD, carotid bruits, or masses. Cardiac: RRR, 2/6 diastolic murmur at the upper sternal borders with a 2/6 systolic murmur along the left sternal border/left lower sternal border, no rubs, or gallops. No clubbing, cyanosis.  1+ left lower extremity edema.  Radials/DP/PT 2+ and equal bilaterally.  Respiratory:  Respirations regular and unlabored, clear to auscultation bilaterally. GI: Soft, nontender, nondistended, BS + x 4. MS: no deformity or atrophy. Skin: warm and dry, no rash. Neuro:  Strength and sensation are intact. Psych: Normal affect.  Accessory Clinical Findings    ECG personally reviewed by me today -regular sinus rhythm, 70, left axis deviation, PVC, prior septal infarct with lateral T wave inversion - no acute changes.  Lab Results  Component Value Date   WBC 6.1 06/29/2018   HGB 15.4 (H) 06/29/2018   HCT 47.7 (H) 06/29/2018   MCV 96.4 06/29/2018   PLT 165 06/29/2018   Lab Results  Component Value Date   CREATININE 0.98 06/29/2018   BUN 24 (H) 06/29/2018   NA 139 06/29/2018   K 3.5 06/29/2018   CL 106 06/29/2018   CO2 22 06/29/2018   Lab Results  Component Value Date   ALT 36 06/27/2018   AST 37 06/27/2018   ALKPHOS  35 (L) 06/27/2018   BILITOT 0.5 06/27/2018    Assessment & Plan    1.  Nonischemic cardiomyopathy/HFrEF: Recent admission with left lower extremity DVT and also heart failure.  She diuresed well.  Weight is 157 pounds today which is down from her recorded discharge weight, though I do not think that the hospital weight was accurate.  She is euvolemic on examination today with the exception of mild left lower extremity edema in the setting of DVT.  I will follow-up a basic metabolic panel today.  She otherwise remains on beta-blocker, ACE inhibitor, and Spironolactone therapy.  We discussed the importance of daily weights, sodium restriction, medication compliance, and symptom reporting and she verbalizes understanding.  She does not currently own a scale and therefore is not weighing daily.  She says she will obtain 1.  2.  Nonsustained VT/status post AICD: Previously followed at [facility].  She wishes to establish in device clinic here.  I will plan for follow-up with Dr. Caryl Comes.  Continue beta-blocker.  3.  Left lower extremity DVT: On apixaban however she was taking this 5 mg once daily.  We made it clear to her that she will need to take 5 mg twice daily.  If she is unable to do this, we will need to switch her to Xarelto.  4.  Persistent atrial fibrillation: This was diagnosed in 2008-2009.  She is now on Eliquis in the setting of above.  5.  Essential hypertension: Stable on current regimen.  6.  Hyperlipidemia: Continue statin therapy.  This is been followed by her primary care provider previously.  We do not have lipids on file here.  7.  Elevated troponin: This was noted during hospitalization at the time of DVT and heart failure.  She denies a history of chest pain.  She reports prior ischemic evaluation/cath at [facility] many years ago.  We discussed potentially pursuing a stress test however at this time, she is not interested.  She is on low-dose aspirin, beta-blocker, and statin therapy.  8.   Disposition: Follow-up CBC and basic metabolic panel today in the setting of Eliquis therapy and recent  diuresis.  I will arrange for EP follow-up so that she can establish device care.  Otherwise follow-up in the next 4 to 6 weeks.   Murray Hodgkins, NP 07/08/2018, 4:38 PM

## 2018-07-08 NOTE — Patient Instructions (Signed)
Medication Instructions:  Your physician recommends that you continue on your current medications as directed. Please refer to the Current Medication list given to you today. 1- Make sure you are taking Eliquis  1 tablet (5 mg total ) TWICE daily. If you need a refill on your cardiac medications before your next appointment, please call your pharmacy.   Lab work: Your physician recommends that you have lab work today(BMET)   If you have labs (blood work) drawn today and your tests are completely normal, you will receive your results only by: Marland Kitchen MyChart Message (if you have MyChart) OR . A paper copy in the mail If you have any lab test that is abnormal or we need to change your treatment, we will call you to review the results.  Testing/Procedures: None ordered   Follow-Up: At [facility], you and your health needs are our priority.  As part of our continuing mission to provide you with exceptional heart care, we have created designated Provider Care Teams.  These Care Teams include your primary Cardiologist (physician) and Advanced Practice Providers (APPs -  Physician Assistants and Nurse Practitioners) who all work together to provide you with the care you need, when you need it. You will need a follow up appointment in 1 months. You may see Nelva Bush, MD or Murray Hodgkins, NP.

## 2018-07-08 NOTE — Telephone Encounter (Signed)
Call to daughter [name] to clarify verbal from Ignacia Bayley, NP.   Okay to hold asa here on out. D/c from med list.   No further questions at this time.   Advised pt to call for any further questions or concerns.

## 2018-07-09 LAB — BASIC METABOLIC PANEL
BUN/Creatinine Ratio: 18 (ref 12–28)
BUN: 22 mg/dL (ref 8–27)
CO2: 20 mmol/L (ref 20–29)
Calcium: 8.6 mg/dL — ABNORMAL LOW (ref 8.7–10.3)
Chloride: 101 mmol/L (ref 96–106)
Creatinine, Ser: 1.19 mg/dL — ABNORMAL HIGH (ref 0.57–1.00)
GFR calc Af Amer: 49 mL/min/{1.73_m2} — ABNORMAL LOW (ref >59)
GFR calc non Af Amer: 43 mL/min/{1.73_m2} — ABNORMAL LOW (ref >59)
Glucose: 80 mg/dL (ref 65–99)
Potassium: 3.6 mmol/L (ref 3.5–5.2)
Sodium: 137 mmol/L (ref 134–144)

## 2018-07-12 ENCOUNTER — Encounter: Payer: Self-pay | Admitting: *Deleted

## 2018-07-19 ENCOUNTER — Ambulatory Visit
Admission: RE | Admit: 2018-07-19 | Discharge: 2018-07-19 | Disposition: A | Discharge status: Home / Self Care | Payer: Medicare Other | Source: Ambulatory Visit | Attending: Oncology | Admitting: Oncology

## 2018-07-19 ENCOUNTER — Other Ambulatory Visit: Payer: Self-pay

## 2018-07-19 DIAGNOSIS — C50212 Malignant neoplasm of upper-inner quadrant of left female breast: Secondary | ICD-10-CM

## 2018-07-19 DIAGNOSIS — M810 Age-related osteoporosis without current pathological fracture: Secondary | ICD-10-CM | POA: Insufficient documentation

## 2018-07-19 DIAGNOSIS — Z1231 Encounter for screening mammogram for malignant neoplasm of breast: Secondary | ICD-10-CM | POA: Insufficient documentation

## 2018-07-22 NOTE — Progress Notes (Deleted)
   Patient ID: Julia Gordon, female    DOB: 1934/06/07, 84 y.o.   MRN: [id number]  HPI  Julia Gordon is a 84 y/o female with a history of  Echo report from 06/28/2018 reviewed and showed an EF of 25-30% along with mild/moderate MR, mild AR, moderate TR and an elevated PA pressure of 71.0 mmHg.   Admitted 06/27/2018 due to acute on chronic HF and bilateral lower extremity DVT's. Vascular and cardiology consults obtained. Initially given heparin drip and transitioned to oral anticoagulants. Elevated troponins thought to be due to demand ischemia. Discharged after 3 days.   She presents today for her initial visit with a chief complaint of   Review of Systems    Physical Exam    Assessment & Plan:  1: Chronic heart failure with reduced ejection fraction- - NYHA class  2:

## 2018-07-23 ENCOUNTER — Telehealth: Payer: Self-pay | Admitting: Family

## 2018-07-23 ENCOUNTER — Ambulatory Visit: Payer: BC Managed Care – PPO | Admitting: Family

## 2018-07-23 NOTE — Telephone Encounter (Signed)
Patient did not show for her initial Heart Failure Clinic appointment on 07/23/2018. Will attempt to reschedule.

## 2018-07-29 ENCOUNTER — Encounter: Payer: Medicare Other | Admitting: Internal Medicine

## 2018-07-29 NOTE — Telephone Encounter (Signed)
The patient no showed for her appointment with Dr. Caryl Comes this morning.

## 2018-08-19 NOTE — Progress Notes (Deleted)
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  INTERVAL HISTORY: Patient returns to clinic today for routine 30-monthevaluation.  She continues to feel well and remains asymptomatic.  She is tolerating tamoxifen without significant side effects.  She has no neurologic complaints.  She denies any recent fevers or illnesses.  She has a good appetite and denies weight loss. She denies any chest pain, shortness of breath, cough, or hemoptysis.  She has no nausea, vomiting, constipation, or diarrhea.  She has no urinary complaints.  Patient feels at her baseline offers no specific complaints today.  REVIEW OF SYSTEMS:   Review of Systems  Constitutional: Negative.  Negative for fever, malaise/fatigue and weight loss.  Respiratory: Negative.  Negative for cough and shortness of breath.   Cardiovascular: Negative.  Negative for chest pain and leg swelling.  Gastrointestinal: Negative.  Negative for abdominal pain and constipation.  Genitourinary: Negative.  Negative for dysuria.  Musculoskeletal: Negative.  Negative for back pain.  Skin: Negative.  Negative for rash.  Neurological: Negative.  Negative for focal weakness, weakness and headaches.  Endo/Heme/Allergies: Negative.   Psychiatric/Behavioral: Negative.  The patient is not nervous/anxious.     As per HPI. Otherwise, a complete review of systems is negative.  PAST MEDICAL HISTORY: Past Medical History:  Diagnosis Date  . Anemia   . Breast cancer ([place]   a. L mastectomy with chemoradiation  . [place]  adenocarcinoma ([place]   . COPD (chronic obstructive pulmonary disease) ([place]   . Depression   . DJD (degenerative joint disease) of knee    right knee  . H/O hysterectomy with oophorectomy    for DUB  . HFrEF (heart failure with reduced ejection fraction) ([place]    a. 06/2018 Echo: EF 25-30%.  . History of depression   . HLD (hyperlipidemia)   . HTN (hypertension)   . LV (left ventricular) mural thrombus    history of, resolved (echo 04/09)  . NICM ([place]ischemic cardiomyopathy) ([place]    a. [facility]. [facility] [place] [facility] CB[facility] Kerbs c. 2014 - prev seen by G. TLovena Le MD; c. 06/2018 Echo: EF 25-30%, DD. Diff HK. RVSP 783mg. Mildly dil RA. Mild to mod MR. Mod TR. Mild AI.  . Marland Kitchenersistent atrial fibrillation    a. noted in Care Everywhere from 2008-2009; b. CHADS2VASc 6 (CHF, HTN, age x 2, vascular disease, female)-->eliquis added 06/2018 in setting of LLE DVT.  . Valvular heart disease    a. 06/2018 Echo: Mod TR, mild to omd MR, mild AI.    PAST SURGICAL HISTORY: Past Surgical History:  Procedure Laterality Date  . ABDOMINAL HYSTERECTOMY    . BREAST BIOPSY Left 05/10/13   us[place]oreax-positive  . COLONOSCOPY WITH PROPOFOL N/A 01/15/2017   Procedure: COLONOSCOPY WITH PROPOFOL;  Surgeon: AnJonathon BellowsMD;  [facility];  Service: Gastroenterology;  Laterality: N/A;  . LAPAROSCOPIC RIGHT COLON RESECTION  08/10   Dr ElPat Patrickfor adenoca   . MASTECTOMY Left 2015   BREAST CA  . OOPHORECTOMY    . ORIF FEMUR FRACTURE Right 07/21/2017  Procedure: OPEN REDUCTION INTERNAL FIXATION (ORIF) DISTAL FEMUR FRACTURE;  Surgeon: Lovell Sheehan, MD;  Location: [facility] ORS;  Service: Orthopedics;  Laterality: Right;  femur   . PACEMAKER INSERTION  2009  . REPLACEMENT TOTAL KNEE  1990's   right     FAMILY HISTORY Family History  Problem Relation Age of Onset  . Hypertension Father   . Alzheimer's disease Mother   . Breast cancer Sister 17   . Diabetes Other        brothers  . Stroke Brother   . Heart failure Brother   . Lupus Sister        ADVANCED DIRECTIVES:    HEALTH MAINTENANCE: Social History   Tobacco Use  . Smoking status: Former Research scientist (life sciences)  . Smokeless tobacco: Never Used  . Tobacco comment: quit 2006  Substance Use Topics  . Alcohol use: No    Comment: former  . Drug use: No     Allergies  Allergen Reactions  . No Known Allergies     Current Outpatient Medications  Medication Sig Dispense Refill  . alendronate (FOSAMAX) 70 MG tablet TAKE 1 TAB WEEKLY 30 MINS BEFORE BREAKFAST WITH A FULL GLASS OF WATER. DO NOT LIE DOWN FOR 30 MINS 12 tablet 3  . apixaban (ELIQUIS) 5 MG TABS tablet Take 1 tablet (5 mg total) by mouth 2 (two) times daily. 180 tablet 3  . furosemide (LASIX) 40 MG tablet Take 1 tablet (40 mg total) by mouth daily. 30 tablet 1  . isosorbide mononitrate (IMDUR) 30 MG 24 hr tablet Take 1 tablet by mouth Daily.    Marland Kitchen KLOR-CON M20 20 MEQ tablet Take 1 tablet by mouth Daily.    Marland Kitchen lisinopril (PRINIVIL,ZESTRIL) 20 MG tablet Take 20 mg by mouth daily.    Marland Kitchen lovastatin (MEVACOR) 40 MG tablet Take 2 tablets by mouth Daily.    . metoprolol succinate (TOPROL-XL) 50 MG 24 hr tablet Take 50 mg by mouth daily.     . MULTIPLE VITAMIN PO Take 1 tablet by mouth daily.     Marland Kitchen spironolactone (ALDACTONE) 50 MG tablet Take 50 mg by mouth daily.    . tamoxifen (NOLVADEX) 20 MG tablet Take 20 mg by mouth daily.    . traMADol (ULTRAM) 50 MG tablet Take 1 tablet (50 mg total) by mouth every 8 (eight) hours as needed for severe pain. 20 tablet 0   No current facility-administered medications for this visit.     OBJECTIVE: There were no vitals filed for this visit.   There is no height or weight on file to calculate BMI.    ECOG FS:0 - Asymptomatic  General: Well-developed, well-nourished, no acute distress.  Sitting in a wheelchair. Eyes: Pink conjunctiva, anicteric sclera. HEENT: Normocephalic, moist mucous  membranes. Lungs: Clear to auscultation bilaterally. Heart: Regular rate and rhythm. No rubs, murmurs, or gallops. Abdomen: Soft, nontender, nondistended. No organomegaly noted, normoactive bowel sounds. Musculoskeletal: No edema, cyanosis, or clubbing. Neuro: Alert, answering all questions appropriately. Cranial nerves grossly intact. Skin: No rashes or petechiae noted. Psych: Normal affect.  LAB RESULTS:  Lab Results  Component Value Date   NA 137 07/08/2018   K 3.6 07/08/2018   CL 101 07/08/2018   CO2 20 07/08/2018   GLUCOSE 80 07/08/2018   BUN 22 07/08/2018   CREATININE 1.19 (H) 07/08/2018   CALCIUM 8.6 (L) 07/08/2018   PROT 5.3 (L) 06/27/2018   ALBUMIN 3.1 (L) 06/27/2018   AST 37 06/27/2018   ALT 36 06/27/2018  ALKPHOS 35 (L) 06/27/2018   BILITOT 0.5 06/27/2018   GFRNONAA 43 (L) 07/08/2018   GFRAA 49 (L) 07/08/2018    Lab Results  Component Value Date   WBC 6.1 06/29/2018   NEUTROABS 3.0 03/24/2016   HGB 15.4 (H) 06/29/2018   HCT 47.7 (H) 06/29/2018   MCV 96.4 06/29/2018   PLT 165 06/29/2018   Lab Results  Component Value Date   LABCA2 24.4 03/24/2016     STUDIES: No results found.  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  PLAN:    1. 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 Despite this, left breast mastectomy revealed significant residual disease. Patient underwent total mastectomy on December 13, 2013.  Adjuvant XRT was recommended, but patient refused. She acknowledged that this would increase her risk of recurrence but she still declined. Given patient's worsening osteoporosis,  anastrozole was discontinued and patient is now on tamoxifen.  Continue tamoxifen for a minimum of 5 years completing in January 2021.  Given her high risk of recurrence, will consider extending treatment to 7 to 10 years.  Her most recent right screening mammogram on July 14, 2017 was reported as BI-RADS 2.  Repeat in July 2020.  Return to clinic in 6 months for routine evaluation.   2. Osteoporosis: Patient's most recent bone mineral density on July 14, 2017 revealed a T score of -3.1 which is unchanged from one year prior.  Continue Fosamax, calcium, and vitamin D.  Letrozole was discontinued and patient was initiated on tamoxifen as above.  Repeat in July 2020. 3. Family history: Patient may benefit from genetic testing in the future.  I spent a total of 20 minutes face-to-face with the patient of which greater than 50% of the visit was spent in counseling and coordination of care as detailed above.  Patient expressed understanding and was in agreement with this plan. She also understands that She can call clinic at any time with any questions, concerns, or complaints.   Breast cancer   Staging form: Breast, AJCC 7th Edition     Clinical stage from 08/01/2014: Stage IIIA (T1c, N2a, M0) - Signed by Lloyd Huger, MD on 08/01/2014   Lloyd Huger, MD   08/19/2018 11:50 PM

## 2018-08-24 ENCOUNTER — Encounter: Payer: Self-pay | Admitting: Oncology

## 2018-08-24 ENCOUNTER — Inpatient Hospital Stay: Payer: Medicare Other | Admitting: Oncology

## 2018-08-25 ENCOUNTER — Ambulatory Visit (INDEPENDENT_AMBULATORY_CARE_PROVIDER_SITE_OTHER): Payer: Medicare Other | Admitting: Nurse Practitioner

## 2018-08-25 ENCOUNTER — Encounter: Payer: Self-pay | Admitting: Nurse Practitioner

## 2018-08-25 ENCOUNTER — Other Ambulatory Visit: Payer: Self-pay

## 2018-08-25 VITALS — BP 134/82 | HR 84 | Ht 62.0 in | Wt 167.0 lb

## 2018-08-25 DIAGNOSIS — I4819 Other persistent atrial fibrillation: Secondary | ICD-10-CM | POA: Diagnosis not present

## 2018-08-25 DIAGNOSIS — I428 Other cardiomyopathies: Secondary | ICD-10-CM | POA: Diagnosis not present

## 2018-08-25 DIAGNOSIS — I10 Essential (primary) hypertension: Secondary | ICD-10-CM

## 2018-08-25 DIAGNOSIS — E782 Mixed hyperlipidemia: Secondary | ICD-10-CM

## 2018-08-25 DIAGNOSIS — I4729 Other ventricular tachycardia: Secondary | ICD-10-CM

## 2018-08-25 DIAGNOSIS — I5023 Acute on chronic systolic (congestive) heart failure: Secondary | ICD-10-CM

## 2018-08-25 DIAGNOSIS — I472 Ventricular tachycardia: Secondary | ICD-10-CM

## 2018-08-25 NOTE — Patient Instructions (Signed)
Medication Instructions:  Your physician has recommended you make the following change in your medication:   1) INCREASE Lasix to 40mg  twice daily for 7 days 2) INCREASE Potassium to 26mEq twice daily for 7 days   If you need a refill on your cardiac medications before your next appointment, please call your pharmacy.   Lab work: Your physician recommends that you return for lab work in: 7 days, same day as your appointment  If you have labs (blood work) drawn today and your tests are completely normal, you will receive your results only by: Marland Kitchen MyChart Message (if you have MyChart) OR . A paper copy in the mail If you have any lab test that is abnormal or we need to change your treatment, we will call you to review the results.  Testing/Procedures: None ordered  Follow-Up: At [facility], you and your health needs are our priority.  As part of our continuing mission to provide you with exceptional heart care, we have created designated Provider Care Teams.  These Care Teams include your primary Cardiologist (physician) and Advanced Practice Providers (APPs -  Physician Assistants and Nurse Practitioners) who all work together to provide you with the care you need, when you need it. You will need a follow up appointment in 1 week 09/01/18 @ 1:40pm with Ignacia Bayley, NP.  You have been referred to Dr.Klein to establish for device management  Any Other Special Instructions Will Be Listed Below (If Applicable). N/A

## 2018-08-25 NOTE — Progress Notes (Addendum)
Office Visit    Patient Name: Julia Gordon Date of Encounter: 08/25/2018  Primary Care Provider:  Ellamae Sia, MD Primary Cardiologist:  Nelva Bush, MD  Chief Complaint    84 year old female with a history of nonischemic cardiomyopathy and HFrEF status post AICD, left ventricular mural thrombus (2009), persistent atrial fibrillation anticoagulated with Xarelto, bilateral breast cancer, hypertension, hyperlipidemia, nonsustained ventricular tachycardia, who presents for follow-up related to increasing dyspnea and volume overload.  Past Medical History    Past Medical History:  Diagnosis Date  . Anemia   . Breast cancer ([place]) 2015   a. L mastectomy with chemoradiation  . Colon adenocarcinoma ([place])   . COPD (chronic obstructive pulmonary disease) ([place])   . Depression   . DJD (degenerative joint disease) of knee    right knee  . H/O hysterectomy with oophorectomy    for DUB  . HFrEF (heart failure with reduced ejection fraction) ([place])    a. 06/2018 Echo: EF 25-30%.  . History of depression   . HLD (hyperlipidemia)   . HTN (hypertension)   . LV (left ventricular) mural thrombus    history of, resolved (echo 04/09)  . NICM (nonischemic cardiomyopathy) ([place])    a. Reports prior h/o cath @ [facility]; b. status post [place] with Guidant lead in 2009 at [facility], Dr. Boyd Kerbs; c. 2014 - prev seen by G. Lovena Le, MD; c. 06/2018 Echo: EF 25-30%, DD. Diff HK. RVSP 81mmHg. Mildly dil RA. Mild to mod MR. Mod TR. Mild AI.  Marland Kitchen Persistent atrial fibrillation    a. noted in Care Everywhere from 2008-2009; b. CHADS2VASc 6 (CHF, HTN, age x 2, vascular disease, female)-->eliquis added 06/2018 in setting of LLE DVT.  . Valvular heart disease    a. 06/2018 Echo: Mod TR, mild to mod MR, mild AI.   Past Surgical History:  Procedure Laterality Date  . ABDOMINAL HYSTERECTOMY    . BREAST BIOPSY Left 05/10/13   [place] bx-positive  . COLONOSCOPY WITH PROPOFOL N/A 01/15/2017   Procedure: COLONOSCOPY  WITH PROPOFOL;  Surgeon: Jonathon Bellows, MD;  Location: [facility] ENDOSCOPY;  Service: Gastroenterology;  Laterality: N/A;  . LAPAROSCOPIC RIGHT COLON RESECTION  08/10   Dr Pat Patrick, for adenoca   . MASTECTOMY Left 2015   BREAST CA  . OOPHORECTOMY    . ORIF FEMUR FRACTURE Right 07/21/2017   Procedure: OPEN REDUCTION INTERNAL FIXATION (ORIF) DISTAL FEMUR FRACTURE;  Surgeon: Lovell Sheehan, MD;  Location: [facility] ORS;  Service: Orthopedics;  Laterality: Right;  femur   . PACEMAKER INSERTION  2009  . REPLACEMENT TOTAL KNEE  1990's   right     Allergies  Allergies  Allergen Reactions  . No Known Allergies     History of Present Illness    84 year old female with the above complex past medical history including nonischemic cardiomyopathy and HFrEF status post [place] implanted in 2009, LV thrombus in 2009, persistent atrial fibrillation, nonsustained VT, hypertension, and hyperlipidemia.  She was previously followed at [facility] and diagnosed with A. fib in 2008.  She later saw Dr. Lovena Le in our group in 2014 and subsequently lost to follow-up.  In June of this year, she was admitted to [facility] with a 3-week history of left lower extremity swelling and was diagnosed with a DVT and placed on Eliquis.  CT of the chest was negative for PE.  She also had volume overload and required significant diuresis.  When she was seen in follow-up on July 2,  she had completed her week of Eliquis 10 mg twice daily but then was only taking Eliquis 5 mg once daily.  Her volume was stable and follow-up labs were acceptable.  She was advised to continue medical therapy with follow-up planned for today.  EP referral was made as she wished to have her device followed in [place] however, this has not yet been carried out.  Since her last visit, she notes that she had been experiencing increasing dyspnea on exertion as well as right greater than left lower extremity swelling.  Her weight is up 10 pounds since her last  visit.  She admits to adding salt to food but does not think she has a significant amount.  She rarely eats at [employer]  She has not had palpitations, PND, orthopnea, chest pain, dizziness, syncope, or early satiety.  She reports compliance with her medications including her Lasix.  She was seen at [facility] clinic and switched from Eliquis to [place].  She is currently on 20 mg daily.  Home Medications    Prior to Admission medications   Medication Sig Start Date End Date Taking? Authorizing Provider  alendronate (FOSAMAX) 70 MG tablet TAKE 1 TAB WEEKLY 30 MINS BEFORE BREAKFAST WITH A FULL GLASS OF WATER. DO NOT LIE DOWN FOR 30 MINS 10/25/17  Yes Finnegan, Kathlene November, MD  apixaban (ELIQUIS) 5 MG TABS tablet Take 1 tablet (5 mg total) by mouth 2 (two) times daily. 07/08/18  Yes Theora Gianotti, NP  furosemide (LASIX) 40 MG tablet Take 1 tablet (40 mg total) by mouth daily. 07/01/18  Yes Fritzi Mandes, MD  isosorbide mononitrate (IMDUR) 30 MG 24 hr tablet Take 1 tablet by mouth Daily. 01/31/11  Yes [provider]  KLOR-CON M20 20 MEQ tablet Take 1 tablet by mouth Daily. 03/03/11  Yes [provider]  lisinopril (PRINIVIL,ZESTRIL) 20 MG tablet Take 20 mg by mouth daily.   Yes [provider]  lovastatin (MEVACOR) 40 MG tablet Take 2 tablets by mouth Daily. 01/31/11  Yes [provider]  metoprolol succinate (TOPROL-XL) 50 MG 24 hr tablet Take 50 mg by mouth daily.  07/11/14  Yes [provider]  MULTIPLE VITAMIN PO Take 1 tablet by mouth daily.    Yes [provider]  spironolactone (ALDACTONE) 50 MG tablet Take 50 mg by mouth daily.   Yes [provider]  traMADol (ULTRAM) 50 MG tablet Take 1 tablet (50 mg total) by mouth every 8 (eight) hours as needed for severe pain. 07/23/17  Yes Fritzi Mandes, MD  tamoxifen (NOLVADEX) 20 MG tablet Take 20 mg by mouth daily.    [provider]    Review of Systems     Increasing dyspnea, lower extremity swelling, and 10 pound weight gain since her last visit.  She denies chest pain, palpitations, PND, orthopnea, dizziness, syncope, or early satiety.  All other systems reviewed and are otherwise negative except as noted above.  Physical Exam    VS:  BP 134/82 (BP Location: Left Arm, Patient Position: Sitting, Cuff Size: Normal)   Pulse 84   Ht 5\' 2"  (1.575 m)   Wt 167 lb (75.8 kg)   SpO2 98%   BMI 30.54 kg/m  , BMI Body mass index is 30.54 kg/m. GEN: Well nourished, well developed, in no acute distress. HEENT: normal. Neck: Supple, JVP approximately 12 cm.  No carotid bruits, or masses. Cardiac: RRR, no murmurs, rubs, or gallops. No clubbing, cyanosis, right greater than left  bilateral lower extremity woody edema-2+ on the right, trace to 1+ on the left.  Radials/DP/PT 2+ and equal bilaterally.  Respiratory:  Respirations regular and unlabored, clear to auscultation bilaterally. GI: Soft, nontender, nondistended, BS + x 4. MS: no deformity or atrophy. Skin: warm and dry, no rash. Neuro:  Strength and sensation are intact. Psych: Normal affect.  Accessory Clinical Findings    ECG personally reviewed by me today -sinus rhythm, 62, PACs and PVCs.  Leftward axis, nonspecific T changes- no acute changes.  Lab Results  Component Value Date   WBC 6.1 06/29/2018   HGB 15.4 (H) 06/29/2018   HCT 47.7 (H) 06/29/2018   MCV 96.4 06/29/2018   PLT 165 06/29/2018   Lab Results  Component Value Date   CREATININE 1.19 (H) 07/08/2018   BUN 22 07/08/2018   NA 137 07/08/2018   K 3.6 07/08/2018   CL 101 07/08/2018   CO2 20 07/08/2018   Lab Results  Component Value Date   ALT 36 06/27/2018   AST 37 06/27/2018   ALKPHOS 35 (L) 06/27/2018   BILITOT 0.5 06/27/2018     Assessment & Plan    1.  Acute on chronic systolic congestive heart failure/nonischemic cardiomyopathy: Echocardiogram in June showed an EF of 25 to 30%.  She was euvolemic at her last  examination however, since then, she is up 10 pounds and has noted increasing lower extremity edema as well as dyspnea on exertion.  She is volume overloaded today.  I am going to increase her Lasix to 40 mg twice daily for the next week and her potassium to 20 mEq twice daily for the same period of time.  She will otherwise remain on ACE inhibitor, beta-blocker, and Spironolactone.  I will plan to see her back in 1 week at which point, we will reevaluate her volume and repeat lab work.  We discussed the importance of daily weights, sodium restriction, medication compliance, and symptom reporting and she verbalizes understanding.   2.  Nonsustained VT status post AICD: Previously followed at [facility] however she wishes to have this followed here.  Will refer to Dr. Caryl Comes again.  3.  Left lower extremity DVT: She was switched from apixaban to rivaroxaban by her primary care provider.   4.  History of persistent atrial fibrillation: Diagnosed in 2008-2009.  Sinus rhythm today.  Now on Xarelto.  5.  Essential hypertension: Blood pressure slightly elevated today.  Increase in diuretic as above though this is just a short-term adjustment.  She remains on nitrate, ACE inhibitor, Spironolactone, and beta-blocker.  7.  Hyperlipidemia: On statin and followed by primary care.  8.  Disposition: Patient will follow-up in 1 week with lab work at that time to reevaluate volume status and renal function.   Murray Hodgkins, NP 08/25/2018, 4:22 PM

## 2018-09-01 ENCOUNTER — Ambulatory Visit: Payer: Medicare Other | Admitting: Internal Medicine

## 2018-09-02 ENCOUNTER — Encounter: Payer: Self-pay | Admitting: Nurse Practitioner

## 2018-09-02 ENCOUNTER — Other Ambulatory Visit: Payer: Self-pay

## 2018-09-02 ENCOUNTER — Ambulatory Visit (INDEPENDENT_AMBULATORY_CARE_PROVIDER_SITE_OTHER): Payer: Medicare Other | Admitting: Nurse Practitioner

## 2018-09-02 VITALS — BP 118/88 | HR 98 | Ht 62.0 in | Wt 151.5 lb

## 2018-09-02 DIAGNOSIS — I428 Other cardiomyopathies: Secondary | ICD-10-CM | POA: Diagnosis not present

## 2018-09-02 DIAGNOSIS — I4819 Other persistent atrial fibrillation: Secondary | ICD-10-CM | POA: Diagnosis not present

## 2018-09-02 DIAGNOSIS — I10 Essential (primary) hypertension: Secondary | ICD-10-CM

## 2018-09-02 DIAGNOSIS — I472 Ventricular tachycardia: Secondary | ICD-10-CM

## 2018-09-02 DIAGNOSIS — I82402 Acute embolism and thrombosis of unspecified deep veins of left lower extremity: Secondary | ICD-10-CM

## 2018-09-02 DIAGNOSIS — I5023 Acute on chronic systolic (congestive) heart failure: Secondary | ICD-10-CM

## 2018-09-02 DIAGNOSIS — I4729 Other ventricular tachycardia: Secondary | ICD-10-CM

## 2018-09-02 MED ORDER — RIVAROXABAN 20 MG PO TABS: 20.0000 mg | ORAL_TABLET | Freq: Every day | ORAL | Status: DC

## 2018-09-02 MED ORDER — RIVAROXABAN 15 MG PO TABS: 15.0000 mg | ORAL_TABLET | Freq: Every day | ORAL | 11 refills | Status: DC

## 2018-09-02 NOTE — Patient Instructions (Addendum)
Medication Instructions:  1- DECREASE Xarelto to 1 tablet (15 mg total) once daily .  If you need a refill on your cardiac medications before your next appointment, please call your pharmacy.   Lab work: Your physician recommends that you have lab work today(BMET). If you have labs (blood work) drawn today and your tests are completely normal, you will receive your results only by: Marland Kitchen MyChart Message (if you have MyChart) OR . A paper copy in the mail If you have any lab test that is abnormal or we need to change your treatment, we will call you to review the results.  Testing/Procedures: None ordered   Follow-Up: At [facility], you and your health needs are our priority.  As part of our continuing mission to provide you with exceptional heart care, we have created designated Provider Care Teams.  These Care Teams include your primary Cardiologist (physician) and Advanced Practice Providers (APPs -  Physician Assistants and Nurse Practitioners) who all work together to provide you with the care you need, when you need it. You will need a follow up appointment in 1 months.  You may see Nelva Bush, MD or Murray Hodgkins, NP.

## 2018-09-02 NOTE — Progress Notes (Signed)
Office Visit    Patient Name: Julia Gordon Date of Encounter: 09/02/2018  Primary Care Provider:  Ellamae Sia, MD Primary Cardiologist:  Nelva Bush, MD  Chief Complaint    84 year old female with a history of nonischemic cardiomyopathy and HFrEF status post AICD, left jugular mural thrombus (2009), persistent atrial fibrillation anticoagulated with Xarelto, bilateral breast cancer, hypertension, hyperlipidemia, nonsustained ventricular tachycardia, who presents for heart failure follow-up.  Past Medical History    Past Medical History:  Diagnosis Date  . Anemia   . Breast cancer ([place]) 2015   a. L mastectomy with chemoradiation  . Colon adenocarcinoma ([place])   . COPD (chronic obstructive pulmonary disease) ([place])   . Depression   . DJD (degenerative joint disease) of knee    right knee  . H/O hysterectomy with oophorectomy    for DUB  . HFrEF (heart failure with reduced ejection fraction) ([place])    a. 06/2018 Echo: EF 25-30%.  . History of depression   . HLD (hyperlipidemia)   . HTN (hypertension)   . LV (left ventricular) mural thrombus    history of, resolved (echo 04/09)  . NICM (nonischemic cardiomyopathy) ([place])    a. Reports prior h/o cath @ [facility]; b. status post [place] with Guidant lead in 2009 at [facility], Dr. Boyd Kerbs; c. 2014 - prev seen by G. Lovena Le, MD; c. 06/2018 Echo: EF 25-30%, DD. Diff HK. RVSP 30mmHg. Mildly dil RA. Mild to mod MR. Mod TR. Mild AI.  Marland Kitchen Persistent atrial fibrillation    a. noted in Care Everywhere from 2008-2009; b. CHADS2VASc 6 (CHF, HTN, age x 2, vascular disease, female)-->eliquis added 06/2018 in setting of LLE DVT.  . Valvular heart disease    a. 06/2018 Echo: Mod TR, mild to mod MR, mild AI.   Past Surgical History:  Procedure Laterality Date  . ABDOMINAL HYSTERECTOMY    . BREAST BIOPSY Left 05/10/13   [place] bx-positive  . COLONOSCOPY WITH PROPOFOL N/A 01/15/2017   Procedure: COLONOSCOPY WITH PROPOFOL;  Surgeon: Jonathon Bellows,  MD;  Location: [facility] ENDOSCOPY;  Service: Gastroenterology;  Laterality: N/A;  . LAPAROSCOPIC RIGHT COLON RESECTION  08/10   Dr Pat Patrick, for adenoca   . MASTECTOMY Left 2015   BREAST CA  . OOPHORECTOMY    . ORIF FEMUR FRACTURE Right 07/21/2017   Procedure: OPEN REDUCTION INTERNAL FIXATION (ORIF) DISTAL FEMUR FRACTURE;  Surgeon: Lovell Sheehan, MD;  Location: [facility] ORS;  Service: Orthopedics;  Laterality: Right;  femur   . PACEMAKER INSERTION  2009  . REPLACEMENT TOTAL KNEE  1990's   right     Allergies  Allergies  Allergen Reactions  . No Known Allergies     History of Present Illness    84 year old female with the above complex past medical history including nonischemic cardiomyopathy and HFrEF status post [place] implanted in 2009, LV thrombus in 2009, persistent atrial fibrillation, nonsustained VT, hypertension, and hyperlipidemia.  She was previous followed at [facility] and diagnosed with atrial fibrillation in 2008.  She later saw Dr. Lovena Le in our group in 2014 and subsequently was lost to follow-up.  In June of this year, she was mid to [facility] with a 3-week history of left lower extremity swelling and was diagnosed with a DVT and placed on Eliquis.  CT of the chest was negative for PE.  She also had volume overload and required significant diuresis.  When she was seen in follow-up on July 2, she had completed her  week of Eliquis 10 mg twice daily but then was only taking Eliquis 5 mg once daily.  Volume was stable and follow-up labs were acceptable.  She was advised to continue medical therapy and we referred to EP to have device follow-up carried out.  Primary care subsequently switched her from Eliquis to [place] likely in order to improve compliance.    When she was seen on August 19, she noted increasing dyspnea on exertion as well as right greater than left lower extremity swelling.  Weight was up 10 pounds since her prior visit.  Lasix was increased to 40 mg twice daily  with a plan to follow-up today.  Wt is now down 16 lbs since her last visit.  She has noted improvement in lower extremity swelling though continues to have significant right lower extremity edema and less pronounced woody left lower leg edema.  She says she sits much of the day.  She does not keep her legs elevated.  She has had some dyspnea on exertion but feels as though this is improved.  She is in atrial fibrillation today at a rate of 98 though denies palpitations.  She reports compliance with her Xarelto and denies PND, orthopnea, dizziness, syncope, or early satiety.  Home Medications    Prior to Admission medications   Medication Sig Start Date End Date Taking? Authorizing Provider  alendronate (FOSAMAX) 70 MG tablet TAKE 1 TAB WEEKLY 30 MINS BEFORE BREAKFAST WITH A FULL GLASS OF WATER. DO NOT LIE DOWN FOR 30 MINS 10/25/17  Yes Lloyd Huger, MD  furosemide (LASIX) 40 MG tablet Take 1 tablet (40 mg total) by mouth daily. 07/01/18  Yes Fritzi Mandes, MD  isosorbide mononitrate (IMDUR) 30 MG 24 hr tablet Take 1 tablet by mouth Daily. 01/31/11  Yes [provider]  KLOR-CON M20 20 MEQ tablet Take 1 tablet by mouth Daily. 03/03/11  Yes [provider]  lisinopril (PRINIVIL,ZESTRIL) 20 MG tablet Take 20 mg by mouth daily.   Yes [provider]  lovastatin (MEVACOR) 40 MG tablet Take 2 tablets by mouth Daily. 01/31/11  Yes [provider]  metoprolol succinate (TOPROL-XL) 50 MG 24 hr tablet Take 50 mg by mouth daily.  07/11/14  Yes [provider]  MULTIPLE VITAMIN PO Take 1 tablet by mouth daily.    Yes [provider]  rivaroxaban (XARELTO) 20 MG TABS tablet Take 20 mg by mouth daily with supper.   Yes [provider]  traMADol (ULTRAM) 50 MG tablet Take 1 tablet (50 mg total) by mouth every 8 (eight) hours as needed for severe pain. 07/23/17  Yes Fritzi Mandes, MD    Review of Systems    Ongoing dyspnea on exertion though she thinks  this has improved.  Ongoing right greater than left lower extremity swelling, which she feels may have improved.  She denies chest pain, palpitations, PND, orthopnea, dizziness, syncope, or early satiety.  All other systems reviewed and are otherwise negative except as noted above.  Physical Exam    VS:  BP 118/88 (BP Location: Right Arm, Patient Position: Sitting, Cuff Size: Normal)   Pulse 98   Ht 5\' 2"  (1.575 m)   Wt 151 lb 8 oz (68.7 kg)   BMI 27.71 kg/m  , BMI Body mass index is 27.71 kg/m. GEN: Well nourished, well developed, in no acute distress. HEENT: normal. Neck: Supple, no JVD, carotid bruits, or masses. Cardiac: Irregularly irregular, no murmurs, rubs, or gallops. No clubbing, cyanosis.  2+  right lower extremity edema extending to the lower thigh, trace to 1+ woody left lower extremity edema.  Radials/PT 1+ and equal bilaterally.  Respiratory:  Respirations regular and unlabored, somewhat diminished at the left base.  Otherwise clear to auscultation. GI: Soft, nontender, nondistended, BS + x 4. MS: no deformity or atrophy. Skin: warm and dry, no rash. Neuro:  Strength and sensation are intact. Psych: Normal affect.  Accessory Clinical Findings    ECG personally reviewed by me today -atrial fibrillation, 98, left axis, nonspecific T changes- no acute changes.  Lab Results  Component Value Date   WBC 6.1 06/29/2018   HGB 15.4 (H) 06/29/2018   HCT 47.7 (H) 06/29/2018   MCV 96.4 06/29/2018   PLT 165 06/29/2018   Lab Results  Component Value Date   CREATININE 1.19 (H) 07/08/2018   BUN 22 07/08/2018   NA 137 07/08/2018   K 3.6 07/08/2018   CL 101 07/08/2018   CO2 20 07/08/2018   Lab Results  Component Value Date   ALT 36 06/27/2018   AST 37 06/27/2018   ALKPHOS 35 (L) 06/27/2018   BILITOT 0.5 06/27/2018    Assessment & Plan    1.  Acute on chronic systolic congestive heart failure/nonischemic cardiomyopathy: Echocardiogram in June of this year showed an EF  of 25 to 30%.  When seen August 19, she was volume overloaded with 10 pound weight gain and worsening dyspnea.  She has been on Lasix 40 mg twice daily since then and her weight is recorded at 16 pounds down today though she continues to have some amount of dyspnea on exertion as well as right greater than left lower extremity edema.  I am going to follow-up basic metabolic panel today and for the time being we will plan to continue Lasix 40 mg twice daily, unless labs returned abnormal.  She is in atrial fibrillation today at a rate of 98 bpm.  She does have a prior history of persistent A. fib dating back to 2008 or 2009 though has been in sinus rhythm on most recent evaluations.  She denies palpitations and was not aware that she was in atrial fibrillation.  She remains on beta-blocker therapy.  Unclear to what extent A. fib may be contributing to ongoing symptoms as it was not present at her last visit when she was more symptomatic.  2.  Persistent atrial fibrillation: As above, in A. fib today at a rate of 98.  Unclear to what extent this might be playing a role in her symptoms as she reports fewer symptoms today than she had last week.  She is now anticoagulated on Xarelto in the setting of recent left lower extremity DVT - 20 mg daily. Will need to consider reducing dose to 15 mg after 6 mos of 20 mg therapy (DVT treatment) in setting of CrCl of 39.4.  Cont  blocker.  3.  History of nonsustained ventricular tachycardia: Status post AICD and previously followed at [facility].  She will follow-up with EP here in the fall.  4.  Left lower extremity DVT: Now has right greater than left lower extremity swelling.  Remains on Xarelto 20 mg daily for DVT treatment.  As above, will need to consider reduction to 50 mg daily in the setting of reduced creatinine clearance and atrial fibrillation, after she completes 6 months of therapy.  5.  Essential hypertension: Stable.  6.  Hyperlipidemia: On statin therapy and  followed by primary care.  7.  Disposition: Follow-up  basic metabolic panel today.  I offered follow-up in 1 to 2 weeks to reassess however, she has transportation issues and prefers to wait 1 month for follow-up.  Murray Hodgkins, NP 09/02/2018, 11:56 AM

## 2018-09-03 ENCOUNTER — Telehealth: Payer: Self-pay

## 2018-09-03 NOTE — Telephone Encounter (Signed)
-----   Message from Theora Gianotti, NP sent at 09/03/2018  1:12 PM EDT ----- Kidneys are looking drier on lasix 40mg  BID.  I'd like her to cut back to 40mg  in the AM and a 1/2 tab (20mg ) in the afternoon. I'm concerned about having to cut back with ongoing lower extremity swelling.  She was reluctant to return for f/u next week and preferred to f/u in a month, but if possible, I could see @ 9a on 9/2 or 4p on 9/3.  Whether or not she wishes to be seen, she will need f/u bmet next week.  Transportation has been an issue for her.

## 2018-09-03 NOTE — Telephone Encounter (Signed)
Call attempted. No answer, no VM available. 

## 2018-09-04 NOTE — Progress Notes (Deleted)
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  INTERVAL HISTORY: Patient returns to clinic today for routine 9-monthevaluation.  She continues to feel well and remains asymptomatic.  She is tolerating tamoxifen without significant side effects.  She has no neurologic complaints.  She denies any recent fevers or illnesses.  She has a good appetite and denies weight loss. She denies any chest pain, shortness of breath, cough, or hemoptysis.  She has no nausea, vomiting, constipation, or diarrhea.  She has no urinary complaints.  Patient feels at her baseline offers no specific complaints today.  REVIEW OF SYSTEMS:   Review of Systems  Constitutional: Negative.  Negative for fever, malaise/fatigue and weight loss.  Respiratory: Negative.  Negative for cough and shortness of breath.   Cardiovascular: Negative.  Negative for chest pain and leg swelling.  Gastrointestinal: Negative.  Negative for abdominal pain and constipation.  Genitourinary: Negative.  Negative for dysuria.  Musculoskeletal: Negative.  Negative for back pain.  Skin: Negative.  Negative for rash.  Neurological: Negative.  Negative for focal weakness, weakness and headaches.  Endo/Heme/Allergies: Negative.   Psychiatric/Behavioral: Negative.  The patient is not nervous/anxious.     As per HPI. Otherwise, a complete review of systems is negative.  PAST MEDICAL HISTORY: Past Medical History:  Diagnosis Date  . Anemia   . Breast cancer ([place] 2015   a. L mastectomy with chemoradiation  . Colon  adenocarcinoma ([place]   . COPD (chronic obstructive pulmonary disease) ([place]   . Depression   . DJD (degenerative joint disease) of knee    right knee  . H/O hysterectomy with oophorectomy    for DUB  . HFrEF (heart failure with reduced ejection fraction) ([place]    a. 06/2018 Echo: EF 25-30%.  . History of depression   . HLD (hyperlipidemia)   . HTN (hypertension)   . LV (left ventricular) mural thrombus    history of, resolved (echo 04/09)  . NICM (nonischemic cardiomyopathy) ([place]    a. [facility]. [facility] [place] [facility] c. 2014 - prev seen by G. TLovena Le MD; c. 06/2018 Echo: EF 25-30%, DD. Diff HK. RVSP 730mg. Mildly dil RA. Mild to mod MR. Mod TR. Mild AI.  . Marland Kitchenersistent atrial fibrillation    a. noted in Care Everywhere from 2008-2009; b. CHADS2VASc 6 (CHF, HTN, age x 2, vascular disease, female)-->eliquis added 06/2018 in setting of LLE DVT.  . Valvular heart disease    a. 06/2018 Echo: Mod TR, mild to mod MR, mild AI.    PAST SURGICAL HISTORY: Past Surgical History:  Procedure Laterality Date  . ABDOMINAL HYSTERECTOMY    . BREAST BIOPSY Left 05/10/13   [place]-positive  . COLONOSCOPY WITH PROPOFOL N/A 01/15/2017   Procedure: COLONOSCOPY WITH PROPOFOL;  Surgeon: AnJonathon BellowsMD;  [facility];  Service: Gastroenterology;  Laterality: N/A;  . LAPAROSCOPIC RIGHT COLON RESECTION  08/10   Dr ElPat Patrickfor adenoca   . MASTECTOMY Left 2015   BREAST CA  . OOPHORECTOMY    . ORIF FEMUR FRACTURE Right 07/21/2017  Procedure: OPEN REDUCTION INTERNAL FIXATION (ORIF) DISTAL FEMUR FRACTURE;  Surgeon: Lovell Sheehan, MD;  Location: [facility];  Service: Orthopedics;  Laterality: Right;  femur   . PACEMAKER INSERTION  2009  . REPLACEMENT TOTAL KNEE  1990's   right     FAMILY HISTORY Family History  Problem Relation Age of Onset  . Hypertension Father   . Alzheimer's disease Mother   . Breast cancer Sister 64   . Diabetes Other        brothers  . Stroke Brother   . Heart failure Brother   . Lupus Sister        ADVANCED DIRECTIVES:    HEALTH MAINTENANCE: Social History   Tobacco Use  . Smoking status: Former Research scientist (life sciences)  . Smokeless tobacco: Never Used  . Tobacco comment: quit 2006  Substance Use Topics  . Alcohol use: No    Comment: former  . Drug use: No     Allergies  Allergen Reactions  . No Known Allergies     Current Outpatient Medications  Medication Sig Dispense Refill  . alendronate (FOSAMAX) 70 MG tablet TAKE 1 TAB WEEKLY 30 MINS BEFORE BREAKFAST WITH A FULL GLASS OF WATER. DO NOT LIE DOWN FOR 30 MINS 12 tablet 3  . furosemide (LASIX) 40 MG tablet Take 1 tablet (40 mg total) by mouth daily. 30 tablet 1  . isosorbide mononitrate (IMDUR) 30 MG 24 hr tablet Take 1 tablet by mouth Daily.    Marland Kitchen KLOR-CON M20 20 MEQ tablet Take 1 tablet by mouth Daily.    Marland Kitchen lisinopril (PRINIVIL,ZESTRIL) 20 MG tablet Take 20 mg by mouth daily.    Marland Kitchen lovastatin (MEVACOR) 40 MG tablet Take 2 tablets by mouth Daily.    . metoprolol succinate (TOPROL-XL) 50 MG 24 hr tablet Take 50 mg by mouth daily.     . MULTIPLE VITAMIN PO Take 1 tablet by mouth daily.     . rivaroxaban (XARELTO) 20 MG TABS tablet Take 1 tablet (20 mg total) by mouth daily with supper. 30 tablet   . traMADol (ULTRAM) 50 MG tablet Take 1 tablet (50 mg total) by mouth every 8 (eight) hours as needed for severe pain. 20 tablet 0   No current facility-administered medications for this visit.     OBJECTIVE: There were no vitals filed for this visit.   There is no height or weight on file to calculate BMI.    ECOG FS:0 - Asymptomatic  General: Well-developed, well-nourished, no acute distress.  Sitting in a wheelchair. Eyes: Pink conjunctiva, anicteric sclera. HEENT: Normocephalic, moist mucous membranes. Lungs: Clear to auscultation bilaterally. Heart: Regular rate and rhythm. No rubs, murmurs, or gallops. Abdomen: Soft, nontender,  nondistended. No organomegaly noted, normoactive bowel sounds. Musculoskeletal: No edema, cyanosis, or clubbing. Neuro: Alert, answering all questions appropriately. Cranial nerves grossly intact. Skin: No rashes or petechiae noted. Psych: Normal affect.  LAB RESULTS:  Lab Results  Component Value Date   NA 143 09/02/2018   K 4.6 09/02/2018   CL 107 (H) 09/02/2018   CO2 WILL FOLLOW 09/02/2018   GLUCOSE 101 (H) 09/02/2018   BUN 38 (H) 09/02/2018   CREATININE 1.27 (H) 09/02/2018   CALCIUM 9.1 09/02/2018   PROT 5.3 (L) 06/27/2018   ALBUMIN 3.1 (L) 06/27/2018   AST 37 06/27/2018   ALT 36 06/27/2018   ALKPHOS 35 (L) 06/27/2018   BILITOT 0.5 06/27/2018   GFRNONAA 39 (L) 09/02/2018   GFRAA 45 (L) 09/02/2018    Lab Results  Component Value Date   WBC 6.1 06/29/2018   NEUTROABS 3.0 03/24/2016   HGB 15.4 (H) 06/29/2018   HCT 47.7 (H) 06/29/2018   MCV 96.4 06/29/2018   PLT 165 06/29/2018   Lab Results  Component Value Date   LABCA2 24.4 03/24/2016     STUDIES: No results found.  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  PLAN:    1. 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 Despite this, left breast mastectomy revealed significant residual disease. Patient underwent total mastectomy on December 13, 2013.  Adjuvant XRT was recommended, but patient refused. She acknowledged that this would increase her risk of recurrence but she still declined. Given patient's worsening osteoporosis, anastrozole was discontinued and patient is now on tamoxifen.  Continue tamoxifen for a minimum of 5 years completing in  January 2021.  Given her high risk of recurrence, will consider extending treatment to 7 to 10 years.  Her most recent right screening mammogram on July 14, 2017 was reported as BI-RADS 2.  Repeat in July 2020.  Return to clinic in 6 months for routine evaluation.   2. Osteoporosis: Patient's most recent bone mineral density on July 14, 2017 revealed a T score of -3.1 which is unchanged from one year prior.  Continue Fosamax, calcium, and vitamin D.  Letrozole was discontinued and patient was initiated on tamoxifen as above.  Repeat in July 2020. 3. Family history: Patient may benefit from genetic testing in the future.  I spent a total of 20 minutes face-to-face with the patient of which greater than 50% of the visit was spent in counseling and coordination of care as detailed above.  Patient expressed understanding and was in agreement with this plan. She also understands that She can call clinic at any time with any questions, concerns, or complaints.   Breast cancer   Staging form: Breast, AJCC 7th Edition     Clinical stage from 08/01/2014: Stage IIIA (T1c, N2a, M0) - Signed by Lloyd Huger, MD on 08/01/2014   Lloyd Huger, MD   09/04/2018 7:39 AM

## 2018-09-06 NOTE — Telephone Encounter (Signed)
Called patient and she asked me to call her daughter, Curlene Dolphin, to discuss her results. She said she does not remember anything and her daughter is younger and can help.  Called [place] and no answer. Left message to call back.

## 2018-09-07 LAB — BASIC METABOLIC PANEL
BUN/Creatinine Ratio: 30 — ABNORMAL HIGH (ref 12–28)
BUN: 38 mg/dL — ABNORMAL HIGH (ref 8–27)
Calcium: 9.1 mg/dL (ref 8.7–10.3)
Chloride: 107 mmol/L — ABNORMAL HIGH (ref 96–106)
Creatinine, Ser: 1.27 mg/dL — ABNORMAL HIGH (ref 0.57–1.00)
GFR calc Af Amer: 45 mL/min/{1.73_m2} — ABNORMAL LOW (ref >59)
GFR calc non Af Amer: 39 mL/min/{1.73_m2} — ABNORMAL LOW (ref >59)
Glucose: 101 mg/dL — ABNORMAL HIGH (ref 65–99)
Potassium: 4.6 mmol/L (ref 3.5–5.2)
Sodium: 143 mmol/L (ref 134–144)

## 2018-09-07 MED ORDER — FUROSEMIDE 40 MG PO TABS: ORAL_TABLET | ORAL | 1 refills | Status: DC

## 2018-09-07 NOTE — Telephone Encounter (Signed)
Patient's daughter calling back. She verbalized understanding to have patient change furosemide to 40 mg in the morning and 20 mg in the afternoon. She is able to bring patient on 9/3 at 4 pm. Appointment scheduled. She will need to come with patient (this has been noted in the appt notes).

## 2018-09-07 NOTE — Telephone Encounter (Signed)
Called patient's daughter as she requested. She is driving and cannot write anything down at this time. She has our number and will call me back later.

## 2018-09-08 ENCOUNTER — Inpatient Hospital Stay: Payer: Medicare Other | Admitting: Oncology

## 2018-09-09 ENCOUNTER — Encounter: Payer: Self-pay | Admitting: Nurse Practitioner

## 2018-09-09 ENCOUNTER — Ambulatory Visit (INDEPENDENT_AMBULATORY_CARE_PROVIDER_SITE_OTHER): Payer: Medicare Other | Admitting: Nurse Practitioner

## 2018-09-09 ENCOUNTER — Other Ambulatory Visit: Payer: Self-pay

## 2018-09-09 ENCOUNTER — Telehealth: Payer: Self-pay | Admitting: Medical

## 2018-09-09 ENCOUNTER — Other Ambulatory Visit
Admission: RE | Admit: 2018-09-09 | Discharge: 2018-09-09 | Disposition: A | Discharge status: Home / Self Care | Payer: Medicare Other | Source: Ambulatory Visit | Attending: Nurse Practitioner | Admitting: Nurse Practitioner

## 2018-09-09 VITALS — BP 106/74 | HR 90 | Ht 64.0 in | Wt 151.0 lb

## 2018-09-09 DIAGNOSIS — I4819 Other persistent atrial fibrillation: Secondary | ICD-10-CM

## 2018-09-09 DIAGNOSIS — I825Y3 Chronic embolism and thrombosis of unspecified deep veins of proximal lower extremity, bilateral: Secondary | ICD-10-CM

## 2018-09-09 DIAGNOSIS — I10 Essential (primary) hypertension: Secondary | ICD-10-CM | POA: Diagnosis not present

## 2018-09-09 DIAGNOSIS — I428 Other cardiomyopathies: Secondary | ICD-10-CM

## 2018-09-09 DIAGNOSIS — I5022 Chronic systolic (congestive) heart failure: Secondary | ICD-10-CM

## 2018-09-09 DIAGNOSIS — E876 Hypokalemia: Secondary | ICD-10-CM

## 2018-09-09 LAB — BASIC METABOLIC PANEL
Anion gap: 13 (ref 5–15)
BUN: 34 mg/dL — ABNORMAL HIGH (ref 8–23)
CO2: 27 mmol/L (ref 22–32)
Calcium: 8.6 mg/dL — ABNORMAL LOW (ref 8.9–10.3)
Chloride: 102 mmol/L (ref 98–111)
Creatinine, Ser: 1.29 mg/dL — ABNORMAL HIGH (ref 0.44–1.00)
GFR calc Af Amer: 45 mL/min — ABNORMAL LOW (ref >60)
GFR calc non Af Amer: 39 mL/min — ABNORMAL LOW (ref >60)
Glucose, Bld: 90 mg/dL (ref 70–99)
Potassium: 2.7 mmol/L — CL (ref 3.5–5.1)
Sodium: 142 mmol/L (ref 135–145)

## 2018-09-09 MED ORDER — AMIODARONE HCL 200 MG PO TABS: 200.0000 mg | ORAL_TABLET | Freq: Two times a day (BID) | ORAL | 11 refills | Status: DC

## 2018-09-09 MED ORDER — FUROSEMIDE 20 MG PO TABS: ORAL_TABLET | ORAL | 11 refills | Status: DC

## 2018-09-09 MED ORDER — POTASSIUM CHLORIDE CRYS ER 20 MEQ PO TBCR: 20.0000 meq | EXTENDED_RELEASE_TABLET | Freq: Every day | ORAL | 3 refills | Status: DC

## 2018-09-09 NOTE — Progress Notes (Signed)
Office Visit    Patient Name: Julia Gordon Date of Encounter: 09/09/2018  Primary Care Julia Gordon:  Julia Sia, MD Primary Cardiologist:  Julia Bush, MD  Chief Complaint    84 year old female with a history of nonischemic cardiomyopathy and HFrEF status post AICD, left ventricular mural thrombus (2009), persistent atrial fibrillation, bilateral breast cancer, hypertension, hyperlipidemia, nonsustained ventricular tachycardia, and recent left lower extremity DVT now on Xarelto, who presents for follow-up of heart failure and A. fib.  Past Medical History    Past Medical History:  Diagnosis Date   Anemia    Breast cancer ([place]) 2015   a. L mastectomy with chemoradiation   Colon adenocarcinoma (H[facility])    COPD (chronic obstructive pulmonary disease) (HCC)    Depression    DJD (degenerative joint disease) of knee    right knee   H/O hysterectomy with oophorectomy    for DUB   HFrEF (heart failure with reduced ejection fraction) ([place])    a. 06/2018 Echo: EF 25-30%.   History of depression    HLD (hyperlipidemia)    HTN (hypertension)    LV (left ventricular) mural thrombus    history of, resolved (echo 04/09)   NICM (nonischemic cardiomyopathy) ([place])    a. Reports prior h/o cath @ [facility]; b. status post [place] with Guidant lead in 2009 at [facility], Julia. Boyd Gordon; c. 2014 - prev seen by G. Julia Le, MD; c. 06/2018 Echo: EF 25-30%, DD. Diff HK. RVSP 53mmHg. Mildly dil RA. Mild to mod MR. Mod TR. Mild AI.   Persistent atrial fibrillation    a. noted in Care Everywhere from 2008-2009; b. CHADS2VASc 6 (CHF, HTN, age x 2, vascular disease, female)-->eliquis added 06/2018 in setting of LLE DVT.   Valvular heart disease    a. 06/2018 Echo: Mod TR, mild to mod MR, mild AI.   Past Surgical History:  Procedure Laterality Date   ABDOMINAL HYSTERECTOMY     BREAST BIOPSY Left 05/10/13   [place] bx-positive   COLONOSCOPY WITH PROPOFOL N/A 01/15/2017   Procedure:  COLONOSCOPY WITH PROPOFOL;  Surgeon: Julia Bellows, MD;  [facility] END[facility];  Service: Gastroenterology;  Laterality: N/A;   LAPAROSCOPIC RIGHT COLON RESECTION  08/10   Julia Gordon, for adenoca    MASTECTOMY Left 2015   BREAST CA   OOPHORECTOMY     ORIF FEMUR FRACTURE Right 07/21/2017   Procedure: OPEN REDUCTION INTERNAL FIXATION (ORIF) DISTAL FEMUR FRACTURE;  Surgeon: Julia Sheehan, MD;  Location: [facility] ORS;  Service: Orthopedics;  Laterality: Right;  femur    PACEMAKER INSERTION  2009   REPLACEMENT TOTAL KNEE  1990's   right     Allergies  Allergies  Allergen Reactions   No Known Allergies     History of Present Illness    84 year old female with the above complex past medical history including nonischemic cardiomyopathy and HFrEF status post [place] implanted in 2009, LV thrombus in 2009, persistent atrial fibrillation, nonsustained VT, hypertension, and hyperlipidemia.  She was previous followed at [facility] and diagnosed with atrial fibrillation in 2008.  She later saw Julia. Lovena Gordon in our group in 2014 was subsequently lost to follow-up.  In June 2020, she was admitted to [facility] with a 3-week history L>R lower extremity swelling and was diagnosed with a L popliteal and R peroneal DVT and placed on Eliquis.  CTA of the chest was negative for PE.  She required diuresis during her hospitalization and was subsequently discharged home on  oral Lasix.  She was initially on Eliquis but primary care switched her to Xarelto to improve compliance.  In mid August, she had worsening dyspnea and right greater than left lower extremity edema.  Weight was up 10 pounds.  Lasix was increased to 40 mg twice daily and at follow-up on August 27, her weight was down 16 pounds though she continued to have significant right lower extremity edema and less pronounced woody left lower leg edema.  Follow-up labs showed mild rise in creatinine and I asked her to reduce Lasix to 40 mg in the morning and  20 mg in the afternoon.  Of note, she was in atrial fibrillation at her last visit though denied being symptomatic.  Since her last visit, she has been wearing support stockings more frequently and has noted significant improvement in bilateral lower extremity swelling.  Her daughter is present with her today and her daughter notes that she has been more short of breath over the past 3 to 4 weeks despite weight loss.  Julia Gordon says sometimes she notes irregular heart rhythm and has sometimes noted this over the years but does not currently note this.  She is still in atrial fibrillation today though her rate is slightly better than it was last week-90.  She has been compliant with her Xarelto and is also on beta-blocker therapy.  She denies chest pain, PND, orthopnea, dizziness, syncope, or early satiety.  Home Medications    Prior to Admission medications   Medication Sig Start Date End Date Taking? Authorizing Julia Gordon  alendronate (FOSAMAX) 70 MG tablet TAKE 1 TAB WEEKLY 30 MINS BEFORE BREAKFAST WITH A FULL GLASS OF WATER. DO NOT LIE DOWN FOR 30 MINS 10/25/17  Yes Julia Huger, MD  furosemide (LASIX) 40 MG tablet Take 40 mg (1 tablet) by mouth every morning and 20 mg (0.5 tablet) by mouth every afternoon. 09/07/18  Yes Julia Gianotti, NP  isosorbide mononitrate (IMDUR) 30 MG 24 hr tablet Take 1 tablet by mouth Daily. 01/31/11  Yes Julia Gordon, Historical, MD  KLOR-CON M20 20 MEQ tablet Take 1 tablet by mouth Daily. 03/03/11  Yes Julia Gordon, Historical, MD  lisinopril (PRINIVIL,ZESTRIL) 20 MG tablet Take 20 mg by mouth daily.   Yes Julia Gordon, Historical, MD  lovastatin (MEVACOR) 40 MG tablet Take 2 tablets by mouth Daily. 01/31/11  Yes Julia Gordon, Historical, MD  metoprolol succinate (TOPROL-XL) 50 MG 24 hr tablet Take 50 mg by mouth daily.  07/11/14  Yes Julia Gordon, Historical, MD  MULTIPLE VITAMIN PO Take 1 tablet by mouth daily.    Yes Julia Gordon, Historical, MD  rivaroxaban (XARELTO) 20 MG TABS  tablet Take 1 tablet (20 mg total) by mouth daily with supper. 09/02/18  Yes Julia Gianotti, NP  traMADol (ULTRAM) 50 MG tablet Take 1 tablet (50 mg total) by mouth every 8 (eight) hours as needed for severe pain. 07/23/17  Yes Fritzi Mandes, MD    Review of Systems    Ongoing dyspnea on exertion and per her daughter, it requires very little exertion for her to become short of breath.  She becomes short of breath with talking.  Ongoing right greater than left lower extremity swelling though overall improved.  She denies chest pain, PND, orthopnea, dizziness, syncope, or early satiety.  All other systems reviewed and are otherwise negative except as noted above.  Physical Exam    VS:  BP 106/74 (BP Location: Left Arm, Patient Position: Sitting, Cuff Size: Normal)    Pulse 90  Ht 5\' 4"  (1.626 m)    Wt 151 lb (68.5 kg)    SpO2 98%    BMI 25.92 kg/m  , BMI Body mass index is 25.92 kg/m. GEN: Well nourished, well developed, in no acute distress. HEENT: normal. Neck: Supple, no JVD, carotid bruits, or masses. Cardiac: Irregularly irregular, no murmurs, rubs, or gallops. No clubbing, cyanosis.  1+ right lower extremity edema to the knee and trace left lower extremity edema.  Radials/PT 1+ and equal bilaterally.  Respiratory:  Respirations regular and unlabored, clear to auscultation bilaterally. GI: Soft, nontender, nondistended, BS + x 4. MS: no deformity or atrophy. Skin: warm and dry, no rash. Neuro:  Strength and sensation are intact. Psych: Normal affect.  Accessory Clinical Findings    ECG personally reviewed by me today -atrial fibrillation, 90, left axis, nonspecific T changes- no acute changes.  Lab Results  Component Value Date   WBC 6.1 06/29/2018   HGB 15.4 (H) 06/29/2018   HCT 47.7 (H) 06/29/2018   MCV 96.4 06/29/2018   PLT 165 06/29/2018   Lab Results  Component Value Date   CREATININE 1.27 (H) 09/02/2018   BUN 38 (H) 09/02/2018   NA 143 09/02/2018   K 4.6  09/02/2018   CL 107 (H) 09/02/2018   CO2 CANCELED 09/02/2018   Lab Results  Component Value Date   ALT 36 06/27/2018   AST 37 06/27/2018   ALKPHOS 35 (L) 06/27/2018   BILITOT 0.5 06/27/2018     Assessment & Plan    1.  Chronic systolic congestive heart failure/nonischemic cardiomyopathy: EF 25 to 30% by echocardiography in June of this year.  Recent bout of volume overload and 10 pound weight gain which has responded nicely to an increase in oral Lasix and her weight is down to 151 pounds.  She continues to have right greater than left lower extremity edema though this is improved compared to a week ago.  Recent basic metabolic panel on August 27 showed a slight rise in BUN/creatinine to 38/1.27 and I have asked her to cut back her Lasix to 40 mg in the morning and 20 mg in the afternoon, which she has been doing this week.  Follow-up basic metabolic panel today to ensure stability.  As previously noted, she was found to be back in A. fib last week.  She denies any palpitations at this time but both she and her daughter have noticed reduced activity tolerance and dyspnea with minimal exertion which given improvement in volume, I suspect is secondary to A. fib.  She remains on beta-blocker, ACE inhibitor and there is no blood pressure room for additional titration.  I am going to add amiodarone as outlined below to address her A. Fib.  2.  Persistent atrial fibrillation: Prior history of A. fib dating back to at least 2008 or 2009.  She has noted intermittent palpitations over the years but overall, she does not really have an awareness of how frequently she experiences A. fib.  She has been in A. fib at least since last week.  Though her volume seems to be improved, she has been having dyspnea with minimal activity and I suspect A. fib is playing a role especially in the setting of LV dysfunction.  There is not sufficient blood pressure room to increase her Toprol and I am going to add amiodarone 200  mg twice daily with a plan to see her back in 2 weeks, at which time we can consider cardioversion if she  has not already converted and or reduction in dose if she has.  She has been on Xarelto in the setting of DVT diagnosed in June and has been compliant.  As previously noted, will need to consider reducing Xarelto dose to 15 mg daily in January after she completes 6 months of 20 mg therapy for DVT treatment.  3.  History of nonsustained ventricular tachycardia: Status post AICD and previously followed at [facility].  She plans to transfer her care to [place] and she has follow-up with Julia. Caryl Comes in October.  4.  Right and left lower extremity DVTs: Diagnosed in June with left popliteal and right peroneal DVTs.  She has been compliant on Xarelto.  Following heart failure flare a few weeks ago, her volume is much better.  She continues to have right greater than left lower extremity swelling and at this point, I suspect this is mostly secondary to DVTs.  She has support stockings on today and I have encouraged her to continue to wear these on a daily basis.  We also discussed keeping her legs elevated to reduce lower extremity swelling.  5.  Essential hypertension: Stable.  6.  Hyperlipidemia: On statin therapy.  7.  Disposition: Follow-up basic metabolic panel today.  Plan to see her back in 2 weeks to reassess her rhythm and determine whether or not cardioversion appropriate.  Murray Hodgkins, NP 09/09/2018, 5:24 PM

## 2018-09-09 NOTE — Patient Instructions (Signed)
Medication Instructions:  1- START Amiodarone Take 1 tablet (200 mg total) by mouth 2 (two) times daily 2- CHANGE Lasix Take 40 mg (2 tablets) by mouth every morning and 20 mg (1 tablet) by mouth every afternoon. If you need a refill on your cardiac medications before your next appointment, please call your pharmacy.   Lab work: Your physician recommends that you return for lab work today at the medical mall.  If you have labs (blood work) drawn today and your tests are completely normal, you will receive your results only by: Marland Kitchen MyChart Message (if you have MyChart) OR . A paper copy in the mail If you have any lab test that is abnormal or we need to change your treatment, we will call you to review the results.  Testing/Procedures: None ordered   Follow-Up: At [facility], you and your health needs are our priority.  As part of our continuing mission to provide you with exceptional heart care, we have created designated Provider Care Teams.  These Care Teams include your primary Cardiologist (physician) and Advanced Practice Providers (APPs -  Physician Assistants and Nurse Practitioners) who all work together to provide you with the care you need, when you need it. You will need a follow up appointment in 2 weeks. You may see Nelva Bush, MD or Murray Hodgkins, NP.

## 2018-09-09 NOTE — Telephone Encounter (Signed)
   Notified by lab that patient with potassium 2.7 on labs today. Attempted to contact the patient but she did not answer her phone. Spoke with her daughter, Julia Gordon, who presented to her appointment with Ignacia Bayley today. She has been taking lasix for management of CHF but reports she has not been taking potassium despite being listed in her medications. Rx for potassium 20 mEq sent to her pharmacy with instructions to take 80 mEq tonight, followed by 20 mEq daily going forward. Will order repeat BMET for tomorrow for close monitoring of hypokalemia. Daughter in agreement with plan.   Abigail Butts, PA-C 09/09/18; 6:59 PM

## 2018-09-10 ENCOUNTER — Other Ambulatory Visit
Admission: RE | Admit: 2018-09-10 | Discharge: 2018-09-10 | Disposition: A | Discharge status: Home / Self Care | Payer: Medicare Other | Source: Ambulatory Visit | Attending: Medical | Admitting: Medical

## 2018-09-10 ENCOUNTER — Telehealth: Payer: Self-pay | Admitting: Internal Medicine

## 2018-09-10 DIAGNOSIS — E876 Hypokalemia: Secondary | ICD-10-CM | POA: Insufficient documentation

## 2018-09-10 LAB — BASIC METABOLIC PANEL
Anion gap: 12 (ref 5–15)
BUN: 31 mg/dL — ABNORMAL HIGH (ref 8–23)
CO2: 26 mmol/L (ref 22–32)
Calcium: 8.7 mg/dL — ABNORMAL LOW (ref 8.9–10.3)
Chloride: 104 mmol/L (ref 98–111)
Creatinine, Ser: 1.27 mg/dL — ABNORMAL HIGH (ref 0.44–1.00)
GFR calc Af Amer: 46 mL/min — ABNORMAL LOW (ref >60)
GFR calc non Af Amer: 39 mL/min — ABNORMAL LOW (ref >60)
Glucose, Bld: 104 mg/dL — ABNORMAL HIGH (ref 70–99)
Potassium: 3.3 mmol/L — ABNORMAL LOW (ref 3.5–5.1)
Sodium: 142 mmol/L (ref 135–145)

## 2018-09-10 NOTE — Progress Notes (Signed)
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  INTERVAL HISTORY: Patient returns to clinic today for routine 91-monthevaluation.  She continues to tolerate tamoxifen well without significant side effects.  She currently feels well and is asymptomatic. She has no neurologic complaints.  She denies any recent fevers or illnesses.  She has a good appetite and denies weight loss. She denies any chest pain, shortness of breath, cough, or hemoptysis.  She has no nausea, vomiting, constipation, or diarrhea.  She has no urinary complaints.  Patient offers no specific complaints today.  REVIEW OF SYSTEMS:   Review of Systems  Constitutional: Negative.  Negative for fever, malaise/fatigue and weight loss.  Respiratory: Negative.  Negative for cough and shortness of breath.   Cardiovascular: Negative.  Negative for chest pain and leg swelling.  Gastrointestinal: Negative.  Negative for abdominal pain and constipation.  Genitourinary: Negative.  Negative for dysuria.  Musculoskeletal: Negative.  Negative for back pain.  Skin: Negative.  Negative for rash.  Neurological: Negative.  Negative for focal weakness, weakness and headaches.  Endo/Heme/Allergies: Negative.   Psychiatric/Behavioral: Negative.  The patient is not nervous/anxious.     As per HPI. Otherwise, a complete review of systems is negative.  PAST MEDICAL HISTORY: Past Medical History:  Diagnosis Date  . Anemia   . Breast cancer ([facility] 2015   a. L mastectomy with chemoradiation  . Colon adenocarcinoma ([place]   . COPD  (chronic obstructive pulmonary disease) ([place]   . Depression   . DJD (degenerative joint disease) of knee    right knee  . DVT (deep venous thrombosis) ([place]    a. 06/2018 L popliteal and R peroneal DVTs-->Xarelto.  . H/O hysterectomy with oophorectomy    for DUB  . HFrEF (heart failure with reduced ejection fraction) ([place]    a. 06/2018 Echo: EF 25-30%.  . History of depression   . HLD (hyperlipidemia)   . HTN (hypertension)   . LV (left ventricular) mural thrombus    history of, resolved (echo 04/09)  . NICM (nonischemic cardiomyopathy) ([facility]    a. [facility]. [facility] [place] [facility] [facility] CBoyd Kerbs c. 2014 - prev seen by G. TLovena Le MD; c. 06/2018 Echo: EF 25-30%, DD. Diff HK. RVSP 729mg. Mildly dil RA. Mild to mod MR. Mod TR. Mild AI.  . Marland Kitchenersistent atrial fibrillation    a. noted in Care Everywhere from 2008-2009; b. CHADS2VASc 6 (CHF, HTN, age x 2, vascular disease, female)-->eliquis added 06/2018 in setting of LLE DVT.  . Valvular heart disease    a. 06/2018 Echo: Mod TR, mild to mod MR, mild AI.    PAST SURGICAL HISTORY: Past Surgical History:  Procedure Laterality Date  . ABDOMINAL HYSTERECTOMY    . BREAST BIOPSY Left 05/10/13   usKoreax-positive  . COLONOSCOPY WITH PROPOFOL N/A 01/15/2017   Procedure: COLONOSCOPY WITH PROPOFOL;  Surgeon: AnJonathon BellowsMD;  [facility];  Service: Gastroenterology;  Laterality: N/A;  . LAPAROSCOPIC RIGHT COLON RESECTION  08/10   Dr ElPat Patrickfor adenoca   . MASTECTOMY Left 2015  BREAST CA  . OOPHORECTOMY    . ORIF FEMUR FRACTURE Right 07/21/2017   Procedure: OPEN REDUCTION INTERNAL FIXATION (ORIF) DISTAL FEMUR FRACTURE;  Surgeon: Lovell Sheehan, MD;  Location: [facility] ORS;  Service: Orthopedics;  Laterality: Right;  femur   . PACEMAKER INSERTION  2009  . REPLACEMENT TOTAL KNEE  1990's   right     FAMILY HISTORY Family History  Problem Relation Age of Onset  . Hypertension  Father   . Alzheimer's disease Mother   . Breast cancer Sister 95  . Diabetes Other        brothers  . Stroke Brother   . Heart failure Brother   . Lupus Sister        ADVANCED DIRECTIVES:    HEALTH MAINTENANCE: Social History   Tobacco Use  . Smoking status: Former Research scientist (life sciences)  . Smokeless tobacco: Never Used  . Tobacco comment: quit 2006  Substance Use Topics  . Alcohol use: No    Comment: former  . Drug use: No     Allergies  Allergen Reactions  . No Known Allergies     Current Outpatient Medications  Medication Sig Dispense Refill  . alendronate (FOSAMAX) 70 MG tablet TAKE 1 TAB WEEKLY 30 MINS BEFORE BREAKFAST WITH A FULL GLASS OF WATER. DO NOT LIE DOWN FOR 30 MINS 12 tablet 3  . amiodarone (PACERONE) 200 MG tablet Take 1 tablet (200 mg total) by mouth 2 (two) times daily. 60 tablet 11  . lovastatin (MEVACOR) 40 MG tablet Take 2 tablets by mouth Daily.    . MULTIPLE VITAMIN PO Take 1 tablet by mouth daily.     . traMADol (ULTRAM) 50 MG tablet Take 1 tablet (50 mg total) by mouth every 8 (eight) hours as needed for severe pain. 20 tablet 0  . furosemide (LASIX) 20 MG tablet Take 40 mg (2 tablets) by mouth every morning, take 1 tablet (20 mg total) once in the afternoon. 90 tablet 6  . metoprolol succinate (TOPROL-XL) 50 MG 24 hr tablet Take 1 tablet (50 mg total) by mouth daily. 30 tablet 6  . rivaroxaban (XARELTO) 15 MG TABS tablet Take 1 tablet (15 mg total) by mouth daily with supper. 30 tablet 6   No current facility-administered medications for this visit.     OBJECTIVE: Vitals:   09/24/18 0927  BP: 118/88  Pulse: 89  Resp: 18  Temp: (!) 97.2 F (36.2 C)     Body mass index is 33.18 kg/m.    ECOG FS:0 - Asymptomatic  General: Well-developed, well-nourished, no acute distress.  Sitting in a wheelchair. Eyes: Pink conjunctiva, anicteric sclera. HEENT: Normocephalic, moist mucous membranes. Breast: Patient declined breast exam today. Lungs: Clear to  auscultation bilaterally. Heart: Regular rate and rhythm. No rubs, murmurs, or gallops. Abdomen: Soft, nontender, nondistended. No organomegaly noted, normoactive bowel sounds. Musculoskeletal: No edema, cyanosis, or clubbing. Neuro: Alert, answering all questions appropriately. Cranial nerves grossly intact. Skin: No rashes or petechiae noted. Psych: Normal affect.  LAB RESULTS:  Lab Results  Component Value Date   NA 139 09/23/2018   K 3.9 09/23/2018   CL 106 09/23/2018   CO2 23 09/23/2018   GLUCOSE 87 09/23/2018   BUN 33 (H) 09/23/2018   CREATININE 1.46 (H) 09/23/2018   CALCIUM 8.7 (L) 09/23/2018   PROT 5.3 (L) 06/27/2018   ALBUMIN 3.1 (L) 06/27/2018   AST 37 06/27/2018   ALT 36 06/27/2018   ALKPHOS 35 (L) 06/27/2018   BILITOT 0.5 06/27/2018  GFRNONAA 33 (L) 09/23/2018   GFRAA 38 (L) 09/23/2018    Lab Results  Component Value Date   WBC 6.1 06/29/2018   NEUTROABS 3.0 03/24/2016   HGB 15.4 (H) 06/29/2018   HCT 47.7 (H) 06/29/2018   MCV 96.4 06/29/2018   PLT 165 06/29/2018   Lab Results  Component Value Date   LABCA2 24.4 03/24/2016     STUDIES: No results found.  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  PLAN:    1. 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 Despite this, left breast mastectomy revealed significant residual disease. Patient underwent total mastectomy on December 13, 2013.  Adjuvant XRT was recommended, but patient refused. She acknowledged that this would increase her risk of recurrence but she still declined. Given patient's worsening osteoporosis, anastrozole was  discontinued and patient is now on tamoxifen.  Continue tamoxifen for a minimum of 5 years completing in January 2021.  Given her high risk of recurrence, will consider extending treatment to 7 to 10 years.  Patient's most recent right breast screening mammogram on July 19, 2018 was reported as BI-RADS 2.  Repeat in July 2021.  Return to clinic in 6 months for routine evaluation.  2. Osteoporosis: Patient's most recent bone mineral density on July 19, 2018 revealed a T score of -2.9 which is slightly improved from 1 year prior when it was reported as -3.1.  Continue Fosamax, calcium, and vitamin D.  Letrozole was discontinued and patient was initiated on tamoxifen as above.  Repeat in July 2020. 3. Family history: Patient may benefit from genetic testing in the future.  Patient expressed understanding and was in agreement with this plan. She also understands that She can call clinic at any time with any questions, concerns, or complaints.   Breast cancer   Staging form: Breast, AJCC 7th Edition     Clinical stage from 08/01/2014: Stage IIIA (T1c, N2a, M0) - Signed by Lloyd Huger, MD on 08/01/2014   Lloyd Huger, MD   09/24/2018 12:30 PM

## 2018-09-10 NOTE — Telephone Encounter (Signed)
Patient daughter called back to let us know patient would be going for labs here within the hour.

## 2018-09-10 NOTE — Telephone Encounter (Signed)
Spoke with patients daughter and reviewed preliminary findings with instructions to continue potassium 20 mEq once daily and that note was also sent to ordering provider as well and if there are any additional changes we would give her a call back. She verbalized understanding with no further questions at this time.

## 2018-09-10 NOTE — Telephone Encounter (Signed)
Patient daughter faye calling to discuss recent testing results   Please call

## 2018-09-10 NOTE — Addendum Note (Signed)
Addended by: Valora Corporal on: 09/10/2018 10:40 AM   Modules accepted: Orders

## 2018-09-10 NOTE — Telephone Encounter (Signed)
Routing to ordering provider and per daughter when I spoke with her it didn't sound like she had been taking it at all. They have since found the medication and will make sure she takes it daily.

## 2018-09-10 NOTE — Telephone Encounter (Signed)
Spoke with patients daughter to review if labs had been done yet and clarify medications. She states that patient took medication this morning instead of last night and that she did not get those labs done. Advised the importance of medication and lab monitoring given the low value. Requested that she please have her go to [facility] to have lab done today and the dangers if she does not get this done. She will call to see if she can get someone to take her for labs because she is at work and unable to take her. Lab order changed to reflect [facility] and will reach back out later to follow up on this.

## 2018-09-10 NOTE — Telephone Encounter (Signed)
According to phone note, patient did not take extra potassium until this morning.  Given this and in the setting of the quantity of KCl she was advised to take, in the acute setting I would not recommend adding any further potassium to her regimen other than what has already been directed.  Please route to ordering provider.

## 2018-09-10 NOTE — Telephone Encounter (Signed)
Patients daughter calling in regarding call from the lab last night. Patients daughter states a potassium Rx was sent but has been unable to find where it was sent. Daughter is going to call [facility] to confirm once again. Patient daughter does have questions for the nurse regarding medication changes. Please advise

## 2018-09-11 ENCOUNTER — Telehealth: Payer: Self-pay | Admitting: Cardiology

## 2018-09-11 NOTE — Telephone Encounter (Signed)
Daughter called- her mother needed Rx for Amiodarone and Lasix to cover till her clinic opens Tuesday.  I called these into the [facility] PA-C 09/11/2018 9:45 AM

## 2018-09-15 ENCOUNTER — Telehealth: Payer: Self-pay

## 2018-09-15 NOTE — Telephone Encounter (Signed)
Last OV visit notes and home health order [facility] to [facility]F [facility] and symptom management.  AttnBenjamine Mola RN  Fax # [phone]

## 2018-09-16 ENCOUNTER — Telehealth: Payer: Self-pay | Admitting: *Deleted

## 2018-09-16 DIAGNOSIS — Z79899 Other long term (current) drug therapy: Secondary | ICD-10-CM

## 2018-09-16 DIAGNOSIS — I10 Essential (primary) hypertension: Secondary | ICD-10-CM

## 2018-09-16 DIAGNOSIS — E876 Hypokalemia: Secondary | ICD-10-CM

## 2018-09-16 NOTE — Telephone Encounter (Signed)
No answer or VM after several rings on patient's numbers.  Called son, listed on DPR, and left message to call back to discuss the need for lab work.

## 2018-09-16 NOTE — Telephone Encounter (Signed)
-----   Message from Theora Gianotti, NP sent at 09/16/2018  4:11 PM EDT ----- Marykay Lex guys,  Would you pls reach out to Ms. Flaten and/or her daughter to make sure we have f/u on this potassium.  It was 2.7 and then 3.3.  She was advised to take more according to phone note and I had also reduced her lasix the day that I saw her.  It'd be ideal if she could have a repeat tomorrow.  She doesn't f/u again until the 23rd.  Thanks,  Gerald Stabs

## 2018-09-17 ENCOUNTER — Other Ambulatory Visit: Payer: Self-pay

## 2018-09-17 ENCOUNTER — Other Ambulatory Visit
Admission: RE | Admit: 2018-09-17 | Discharge: 2018-09-17 | Disposition: A | Discharge status: Home / Self Care | Payer: Medicare Other | Attending: Nurse Practitioner | Admitting: Nurse Practitioner

## 2018-09-17 DIAGNOSIS — Z79899 Other long term (current) drug therapy: Secondary | ICD-10-CM | POA: Diagnosis present

## 2018-09-17 DIAGNOSIS — I10 Essential (primary) hypertension: Secondary | ICD-10-CM | POA: Diagnosis present

## 2018-09-17 DIAGNOSIS — E876 Hypokalemia: Secondary | ICD-10-CM | POA: Insufficient documentation

## 2018-09-17 LAB — BASIC METABOLIC PANEL
Anion gap: 9 (ref 5–15)
BUN: 42 mg/dL — ABNORMAL HIGH (ref 8–23)
CO2: 24 mmol/L (ref 22–32)
Calcium: 8.7 mg/dL — ABNORMAL LOW (ref 8.9–10.3)
Chloride: 105 mmol/L (ref 98–111)
Creatinine, Ser: 1.63 mg/dL — ABNORMAL HIGH (ref 0.44–1.00)
GFR calc Af Amer: 34 mL/min — ABNORMAL LOW (ref >60)
GFR calc non Af Amer: 29 mL/min — ABNORMAL LOW (ref >60)
Glucose, Bld: 111 mg/dL — ABNORMAL HIGH (ref 70–99)
Potassium: 3.9 mmol/L (ref 3.5–5.1)
Sodium: 138 mmol/L (ref 135–145)

## 2018-09-17 NOTE — Telephone Encounter (Signed)
Called patient. She verbalized understanding that she needed lab work but it is hard for her to get a ride. She usually has to pay someone. She asked that I call her son or daughter to see if they can help.  No answer with [name], left message to call back.  I was able to get a hold of her daughter, Julia Gordon. Julia Gordon said she would try to get over there today to bring her to the [facility].   Called patient back to let her know. Patient said ok but is concerned because her great-grandchildren are there doing school from her house. She said her daughter would figure it out when she got there she thought.   BMET order entered.

## 2018-09-17 NOTE — Telephone Encounter (Signed)
Patient's son calling back. He verbalized understanding of the recommendations for patient to have lab work. I let him know I had talked to [place] as well. He said she was just trying to call him as well. He will coordinate with her. I suggested they could go tomorrow or Monday if absolutely needed though today would be best.

## 2018-09-20 ENCOUNTER — Telehealth: Payer: Self-pay | Admitting: Nurse Practitioner

## 2018-09-20 MED ORDER — FUROSEMIDE 20 MG PO TABS: ORAL_TABLET | ORAL | Status: DC

## 2018-09-20 NOTE — Addendum Note (Signed)
Addended by: Alvis Lemmings C on: 09/20/2018 12:57 PM   Modules accepted: Orders

## 2018-09-20 NOTE — Telephone Encounter (Signed)
I called and spoke with the patient regarding her lab results and Ignacia Bayley, NP's recommendations to hold lasix x 2 days, then resume at 40 mg once daily.  The patient voiced understanding, but asked that I call her daughter as well. I called and spoke with the patient's daughter, [name].  I have advised her of Ignacia Bayley, NP's recommendations to hold lasix x 2 days (the patient has not had this today) , then resume lasix at 40 mg once daily on Wednesday.   [name] voices understanding of the above. I have also advised the patient has a follow up appointment scheduled on 09/23/18 at 11:40 am with Dr. Saunders Revel to reassess her rhythm.  I advised the patient's daughter we will determine when to recheck her BMP at that time.   [name] was agreeable and again voiced understanding.

## 2018-09-20 NOTE — Telephone Encounter (Signed)
Notes recorded by Theora Gianotti, NP on 09/17/2018 at 4:46 PM EDT  Hold lasix this weekend and reduce to 40mg  daily beginning Monday. F/u bmet in a week pls.

## 2018-09-23 ENCOUNTER — Encounter: Payer: Self-pay | Admitting: Oncology

## 2018-09-23 ENCOUNTER — Other Ambulatory Visit: Payer: Self-pay

## 2018-09-23 ENCOUNTER — Ambulatory Visit (INDEPENDENT_AMBULATORY_CARE_PROVIDER_SITE_OTHER): Payer: Medicare Other | Admitting: Internal Medicine

## 2018-09-23 ENCOUNTER — Other Ambulatory Visit
Admission: RE | Admit: 2018-09-23 | Discharge: 2018-09-23 | Disposition: A | Discharge status: Home / Self Care | Payer: Medicare Other | Source: Ambulatory Visit | Attending: Internal Medicine | Admitting: Internal Medicine

## 2018-09-23 ENCOUNTER — Encounter: Payer: Self-pay | Admitting: Internal Medicine

## 2018-09-23 VITALS — BP 132/80 | HR 87 | Ht 62.0 in | Wt 165.0 lb

## 2018-09-23 DIAGNOSIS — L98491 Non-pressure chronic ulcer of skin of other sites limited to breakdown of skin: Secondary | ICD-10-CM

## 2018-09-23 DIAGNOSIS — M7989 Other specified soft tissue disorders: Secondary | ICD-10-CM | POA: Insufficient documentation

## 2018-09-23 DIAGNOSIS — I5023 Acute on chronic systolic (congestive) heart failure: Secondary | ICD-10-CM | POA: Insufficient documentation

## 2018-09-23 DIAGNOSIS — I4819 Other persistent atrial fibrillation: Secondary | ICD-10-CM | POA: Diagnosis not present

## 2018-09-23 LAB — BASIC METABOLIC PANEL
Anion gap: 10 (ref 5–15)
BUN: 33 mg/dL — ABNORMAL HIGH (ref 8–23)
CO2: 23 mmol/L (ref 22–32)
Calcium: 8.7 mg/dL — ABNORMAL LOW (ref 8.9–10.3)
Chloride: 106 mmol/L (ref 98–111)
Creatinine, Ser: 1.46 mg/dL — ABNORMAL HIGH (ref 0.44–1.00)
GFR calc Af Amer: 38 mL/min — ABNORMAL LOW (ref >60)
GFR calc non Af Amer: 33 mL/min — ABNORMAL LOW (ref >60)
Glucose, Bld: 87 mg/dL (ref 70–99)
Potassium: 3.9 mmol/L (ref 3.5–5.1)
Sodium: 139 mmol/L (ref 135–145)

## 2018-09-23 MED ORDER — RIVAROXABAN 15 MG PO TABS: 15.0000 mg | ORAL_TABLET | Freq: Every day | ORAL | 6 refills | Status: AC

## 2018-09-23 MED ORDER — METOPROLOL SUCCINATE ER 50 MG PO TB24: 50.0000 mg | ORAL_TABLET | Freq: Every day | ORAL | 6 refills | Status: DC

## 2018-09-23 MED ORDER — FUROSEMIDE 20 MG PO TABS: ORAL_TABLET | ORAL | 6 refills | Status: DC

## 2018-09-23 NOTE — Patient Instructions (Signed)
Medication Instructions:  1- STOP Imdur 2- STOP Amlodipine 3- STOP Lisinopril 4- Resume Lasix Take 40 mg (2 tablets) by mouth every morning, take 1 tablet (20 mg total) once in the afternoon. (Take 2 tablets (40 mg total) today when you get home 5- RESTART Metoprolol Succinate Take 1 tablet (50 mg total) by mouth daily. 6- STOP K CL 7- START Xarelto Take 1 tablet (15 mg total) by mouth daily with supper If you need a refill on your cardiac medications before your next appointment, please call your pharmacy.   Lab work: Your physician recommends that you return for lab work today at the [facility] mall. BMET No appt is needed. Hours are M-F 7AM- 6 PM.  If you have labs (blood work) drawn today and your tests are completely normal, you will receive your results only by: Marland Kitchen MyChart Message (if you have MyChart) OR . A paper copy in the mail If you have any lab test that is abnormal or we need to change your treatment, we will call you to review the results.  Testing/Procedures: 1- Lower extremity ultrasound (on same day as 1 week appt, Preferably before OV) Your physician has requested that you have an ankle brachial index (ABI). During this test an ultrasound and blood pressure cuff are used to evaluate the arteries that supply the arms and legs with blood. Allow thirty minutes for this exam. There are no restrictions or special instructions.    Follow-Up: At [facility], you and your health needs are our priority.  As part of our continuing mission to provide you with exceptional heart care, we have created designated Provider Care Teams.  These Care Teams include your primary Cardiologist (physician) and Advanced Practice Providers (APPs -  Physician Assistants and Nurse Practitioners) who all work together to provide you with the care you need, when you need it. You will need a follow up appointment in 1 weeks. You may see one of the following Advanced Practice Providers on your  designated Care Team:   Murray Hodgkins, NP Christell Faith, PA-C . Marrianne Mood, PA-C  Any Other Special Instructions Will Be Listed Below (If Applicable). Referral to wound care  Paper script for wheelchair

## 2018-09-23 NOTE — Progress Notes (Signed)
Follow-up Outpatient Visit Date: 09/23/2018  Primary Care Provider: Ellamae Sia, MD [place] [address]  Chief Complaint: Leg wound  HPI:  Ms. Amsden is a 84 y.o. year-old female with history of chronic HFrEF secondary to NICM complicated by LV thrombus (2009), persistent atrial fibrillation, bilateral breast cancer, hypertension, hyperlipidemia, and LLE DVT on rivaroxaban, who presents for follow-up of heart failure and atrial fibrillation.  She was last seen in our office by Ignacia Bayley, NP, 2 weeks ago.  In mid August, she reported worsening shortness of breath and leg swelling (R>L) accompanied by a 10 pound weight gain.  Furosemide was increased with improvement in weight, though some edema persisted.  Furosemide was deescalated due to worsening renal function.  At her last visit, Ms. Gebhard's daughter reported that her mother seemed more short of breath despite improvement in leg swelling.  As she remained in atrial fibrillation, Ms. Lengacher was started on amiodarone 200 mg BID with plans to reevaluation in ~2 week and consider cardioversion if a-fib persisted.  Today, Ms. Lampkins has multiple complaints.  She is accompanied by her granddaughter, who also provides collateral information.  Ms. Jeanbaptiste reports worsening leg and left arm swelling.  She also developed a wound with clear drainage along the posterior aspect of her left calf a few days ago.  She denies trauma to the area.  She continues to have shortness of breath with minimal activity.  Her mobility has been very limited and she inquires about a prescription for a wheelchair due to her limited mobility.  She also slipped and fell in the shower a few days ago.  He bumped the back of her head, though she states that the blow was not very hard.  She denies loss of consciousness as well as headache, vision changes, and focal weakness/paresthesias.  She has not had any palpitations.  Ms. Lindell granddaughter  reports that her grandmother has not been keeping track of her medications and it is unclear if she has been taking any of her medications since her last visit.  In particularly, it does not appear that she has been using amiodarone, rivaroxaban, and furosemide regularly.  --------------------------------------------------------------------------------------------------  Past Medical History:  Diagnosis Date  . Anemia   . Breast cancer ([place]) 2015   a. L mastectomy with chemoradiation  . Colon adenocarcinoma ([place])   . COPD (chronic obstructive pulmonary disease) ([place])   . Depression   . DJD (degenerative joint disease) of knee    right knee  . DVT (deep venous thrombosis) ([place])    a. 06/2018 L popliteal and R peroneal DVTs-->Xarelto.  . H/O hysterectomy with oophorectomy    for DUB  . HFrEF (heart failure with reduced ejection fraction) ([place])    a. 06/2018 Echo: EF 25-30%.  . History of depression   . HLD (hyperlipidemia)   . HTN (hypertension)   . LV (left ventricular) mural thrombus    history of, resolved (echo 04/09)  . NICM (nonischemic cardiomyopathy) ([place])    a. Reports prior h/o cath @ [facility]; b. status post [place] with [employer] lead in 2009 at [facility], Dr. Boyd Kerbs; c. 2014 - prev seen by G. Lovena Le, MD; c. 06/2018 Echo: EF 25-30%, DD. Diff HK. RVSP 16mHg. Mildly dil RA. Mild to mod MR. Mod TR. Mild AI.  .Marland KitchenPersistent atrial fibrillation    a. noted in Care Everywhere from 2008-2009; b. CHADS2VASc 6 (CHF, HTN, age x 2, vascular disease, female)-->eliquis added 06/2018  in setting of LLE DVT.  . Valvular heart disease    a. 06/2018 Echo: Mod TR, mild to mod MR, mild AI.   Past Surgical History:  Procedure Laterality Date  . ABDOMINAL HYSTERECTOMY    . BREAST BIOPSY Left 05/10/13   [place] bx-positive  . COLONOSCOPY WITH PROPOFOL N/A 01/15/2017   Procedure: COLONOSCOPY WITH PROPOFOL;  Surgeon: Jonathon Bellows, MD;  Location: [facility] ENDOSCOPY;  Service: Gastroenterology;  Laterality: N/A;   . LAPAROSCOPIC RIGHT COLON RESECTION  08/10   Dr Pat Patrick, for adenoca   . MASTECTOMY Left 2015   BREAST CA  . OOPHORECTOMY    . ORIF FEMUR FRACTURE Right 07/21/2017   Procedure: OPEN REDUCTION INTERNAL FIXATION (ORIF) DISTAL FEMUR FRACTURE;  Surgeon: Lovell Sheehan, MD;  Location: [facility] ORS;  Service: Orthopedics;  Laterality: Right;  femur   . PACEMAKER INSERTION  2009  . REPLACEMENT TOTAL KNEE  1990's   right     No outpatient medications have been marked as taking for the 09/23/18 encounter (Office Visit) with Chenell Lozon, Harrell Gave, MD.    Allergies: No known allergies  Social History   Tobacco Use  . Smoking status: Former Research scientist (life sciences)  . Smokeless tobacco: Never Used  . Tobacco comment: quit 2006  Substance Use Topics  . Alcohol use: No    Comment: former  . Drug use: No    Family History  Problem Relation Age of Onset  . Hypertension Father   . Alzheimer's disease Mother   . Breast cancer Sister 12  . Diabetes Other        brothers  . Stroke Brother   . Heart failure Brother   . Lupus Sister     Review of Systems: A 12-system review of systems was performed and was negative except as noted in the HPI.  --------------------------------------------------------------------------------------------------  Physical Exam: BP 132/80 (BP Location: Right Arm, Patient Position: Sitting)   Pulse 87   Ht _0  (1.575 m)   Wt 165 lb (74.8 kg)   SpO2 98%   BMI 30.18 kg/m   General:  Frail, elderly woman seated in a wheelchair. HEENT: No conjunctival pallor or scleral icterus. Facemask in place Neck: Supple without lymphadenopathy or thyromegaly.  JVP ~10 cm. Lungs: Normal work of breathing. Mildly diminished breath sounds throughout.  No wheezes or crackles. Heart: Irregularly irregular without murmurs, rubs, or gallops.  Non-displaced PMI. Abd: Bowel sounds present. Soft with mild diffuse tenderness.  No HSM. Ext: 2+ left arm edema from the shoulder to the hand (history of  breast cancer and lymph node excision on this side).  There is 1+ woody edema in both calves, worse on the right.  Radial pulses are trace bilaterally with palpable vascular calcifications.  Pedal pulses are not palpable in either foot. Skin: Moist, erythematous rash in the left inguinal fold.  There is a superficial ulceration along the left posterior calf with scant serous drainage.  No surrounding erythema noted.  EKG:  Atrial fibrillation (ventricular rate 87 bpm) with left axis deviation, low voltage, and nonspecific T wave changes.`  Lab Results  Component Value Date   WBC 6.1 06/29/2018   HGB 15.4 (H) 06/29/2018   HCT 47.7 (H) 06/29/2018   MCV 96.4 06/29/2018   PLT 165 06/29/2018    Lab Results  Component Value Date   NA 139 09/23/2018   K 3.9 09/23/2018   CL 106 09/23/2018   CO2 23 09/23/2018   BUN 33 (H) 09/23/2018   CREATININE 1.46 (H) 09/23/2018  GLUCOSE 87 09/23/2018   ALT 36 06/27/2018    No results found for: CHOL, HDL, LDLCALC, LDLDIRECT, TRIG, CHOLHDL  --------------------------------------------------------------------------------------------------  ASSESSMENT AND PLAN: Acute on chronic HFrEF: Ms. Weiher is notably volume overloaded on exam and has gained 14 pounds over the last 2 weeks.  I suspect this is likely due to medication non-compliance.  I have recommended restarting furosemide 40 mg QAM and 20 mg QPM, as well as metoprolol succinate 50 mg daily.  We will check a BMP today to ensure stable renal function and electrolytes.  I have encouraged Ms. Bury to limit her sodium intake.  If she has worsening edema/dyspnea, we will need to consider inpatient versus outpatient IV diuresis.  Persistent atrial fibrillation: EKG today again shows a-fib with reasonable ventricular rate control.  Unfortunately, it is unclear which (if any) medications Ms. Armas has been taking over the last 2 weeks.  In light of incomplete anticoagulation and decompensated heart failure,  we cannot proceed with elective cardioversion at this time.  We will restart metoprolol succinate 50 mg daily and rivaroxaban 15 mg daily (dose reduced with eGFR < 50).  We could consider restarting amiodarone in the future once she has been on adequate anticoagulation for at least 4 weeks.  Left calf skin ulcer: I suspect this is due to venous stasis and worsening edema, though poor pedal pulses raise the concern for PAD.  We will arrange for ABI's when she returns for follow-up next week.  I will also refer her to the wound care clinic.  Left arm swelling: Likely a combination of decompensated heart failure and lymphedema in the setting of prior ipsilateral mastectomy and lymph node dissection.  We will restart furosemide, as above.  Further evaluation/management deferred to Dr. Grayland Ormond, whom Ms. Mccammon is scheduled to see tomorrow.  Follow-up: RTC 1 week with ABI's at that time.  Nelva Bush, MD 09/23/2018 8:29 PM

## 2018-09-23 NOTE — Progress Notes (Signed)
Patient pre screened for office appointment, no questions or concerns today. Patient  Unable to come to the phone grand daughter provided answers with assistance from patient.

## 2018-09-24 ENCOUNTER — Inpatient Hospital Stay: Payer: Medicare Other | Attending: Oncology | Admitting: Oncology

## 2018-09-24 ENCOUNTER — Other Ambulatory Visit: Payer: Self-pay

## 2018-09-24 ENCOUNTER — Telehealth: Payer: Self-pay

## 2018-09-24 ENCOUNTER — Telehealth: Payer: Self-pay | Admitting: Internal Medicine

## 2018-09-24 ENCOUNTER — Encounter: Payer: Self-pay | Admitting: Oncology

## 2018-09-24 VITALS — BP 118/88 | HR 89 | Temp 97.2°F | Resp 18 | Wt 181.4 lb

## 2018-09-24 DIAGNOSIS — M810 Age-related osteoporosis without current pathological fracture: Secondary | ICD-10-CM | POA: Insufficient documentation

## 2018-09-24 DIAGNOSIS — C50412 Malignant neoplasm of upper-outer quadrant of left female breast: Secondary | ICD-10-CM | POA: Insufficient documentation

## 2018-09-24 DIAGNOSIS — Z79810 Long term (current) use of selective estrogen receptor modulators (SERMs): Secondary | ICD-10-CM | POA: Insufficient documentation

## 2018-09-24 DIAGNOSIS — Z170 Estrogen receptor positive status [ER+]: Secondary | ICD-10-CM | POA: Insufficient documentation

## 2018-09-24 DIAGNOSIS — C50212 Malignant neoplasm of upper-inner quadrant of left female breast: Secondary | ICD-10-CM | POA: Diagnosis not present

## 2018-09-24 NOTE — Telephone Encounter (Signed)
Patients granddaughter calling in stating that when they took the wheelchair Rx to [place] they were given mobility assessment report. Please advise Whitney on what she needs to do next.

## 2018-09-24 NOTE — Progress Notes (Signed)
Pt denies any difficulties or concerns.

## 2018-09-24 NOTE — Telephone Encounter (Signed)
Call to son per DPR. Reviewed results from labs and discussed f/u.   LE ultrasound 9/23 @ 9 am in the [facility] mall. Appt following at 11 AM.  All questions answered.   Advised pt to call for any further questions or concerns.

## 2018-09-24 NOTE — Telephone Encounter (Signed)
-----   Message from Nelva Bush, MD sent at 09/24/2018  7:18 AM EDT ----- Please let Ms. Nieuwenhuis know that her kidney function and electrolytes are stable.  She should restart her medications and follow-up next week, as we discussed at yesterday's visit.

## 2018-09-25 ENCOUNTER — Encounter: Payer: Self-pay | Admitting: Emergency Medicine

## 2018-09-25 ENCOUNTER — Other Ambulatory Visit: Payer: Self-pay

## 2018-09-25 ENCOUNTER — Emergency Department
Admission: EM | Admit: 2018-09-25 | Discharge: 2018-09-25 | Disposition: A | Discharge status: Home / Self Care | Payer: Medicare Other | Attending: Emergency Medicine | Admitting: Emergency Medicine

## 2018-09-25 ENCOUNTER — Emergency Department: Payer: Medicare Other

## 2018-09-25 DIAGNOSIS — Z950 Presence of cardiac pacemaker: Secondary | ICD-10-CM | POA: Diagnosis not present

## 2018-09-25 DIAGNOSIS — Z87891 Personal history of nicotine dependence: Secondary | ICD-10-CM | POA: Insufficient documentation

## 2018-09-25 DIAGNOSIS — Z79899 Other long term (current) drug therapy: Secondary | ICD-10-CM | POA: Diagnosis not present

## 2018-09-25 DIAGNOSIS — I110 Hypertensive heart disease with heart failure: Secondary | ICD-10-CM | POA: Diagnosis not present

## 2018-09-25 DIAGNOSIS — Z853 Personal history of malignant neoplasm of breast: Secondary | ICD-10-CM | POA: Diagnosis not present

## 2018-09-25 DIAGNOSIS — R600 Localized edema: Secondary | ICD-10-CM | POA: Diagnosis present

## 2018-09-25 DIAGNOSIS — Z7901 Long term (current) use of anticoagulants: Secondary | ICD-10-CM | POA: Diagnosis not present

## 2018-09-25 DIAGNOSIS — I5022 Chronic systolic (congestive) heart failure: Secondary | ICD-10-CM | POA: Insufficient documentation

## 2018-09-25 DIAGNOSIS — J449 Chronic obstructive pulmonary disease, unspecified: Secondary | ICD-10-CM | POA: Insufficient documentation

## 2018-09-25 DIAGNOSIS — I82492 Acute embolism and thrombosis of other specified deep vein of left lower extremity: Secondary | ICD-10-CM | POA: Insufficient documentation

## 2018-09-25 DIAGNOSIS — I739 Peripheral vascular disease, unspecified: Secondary | ICD-10-CM

## 2018-09-25 LAB — COMPREHENSIVE METABOLIC PANEL
ALT: 26 U/L (ref 0–44)
AST: 29 U/L (ref 15–41)
Albumin: 3.1 g/dL — ABNORMAL LOW (ref 3.5–5.0)
Alkaline Phosphatase: 44 U/L (ref 38–126)
Anion gap: 10 (ref 5–15)
BUN: 34 mg/dL — ABNORMAL HIGH (ref 8–23)
CO2: 24 mmol/L (ref 22–32)
Calcium: 8.5 mg/dL — ABNORMAL LOW (ref 8.9–10.3)
Chloride: 105 mmol/L (ref 98–111)
Creatinine, Ser: 1.42 mg/dL — ABNORMAL HIGH (ref 0.44–1.00)
GFR calc Af Amer: 40 mL/min — ABNORMAL LOW (ref >60)
GFR calc non Af Amer: 34 mL/min — ABNORMAL LOW (ref >60)
Glucose, Bld: 102 mg/dL — ABNORMAL HIGH (ref 70–99)
Potassium: 3.2 mmol/L — ABNORMAL LOW (ref 3.5–5.1)
Sodium: 139 mmol/L (ref 135–145)
Total Bilirubin: 1 mg/dL (ref 0.3–1.2)
Total Protein: 6.2 g/dL — ABNORMAL LOW (ref 6.5–8.1)

## 2018-09-25 LAB — CBC WITH DIFFERENTIAL/PLATELET
Abs Immature Granulocytes: 0.03 10*3/uL (ref 0.00–0.07)
Basophils Absolute: 0 10*3/uL (ref 0.0–0.1)
Basophils Relative: 1 %
Eosinophils Absolute: 0.1 10*3/uL (ref 0.0–0.5)
Eosinophils Relative: 1 %
HCT: 44.4 % (ref 36.0–46.0)
Hemoglobin: 14.5 g/dL (ref 12.0–15.0)
Immature Granulocytes: 1 %
Lymphocytes Relative: 21 %
Lymphs Abs: 1.3 10*3/uL (ref 0.7–4.0)
MCH: 30.5 pg (ref 26.0–34.0)
MCHC: 32.7 g/dL (ref 30.0–36.0)
MCV: 93.5 fL (ref 80.0–100.0)
Monocytes Absolute: 0.6 10*3/uL (ref 0.1–1.0)
Monocytes Relative: 10 %
Neutro Abs: 4.1 10*3/uL (ref 1.7–7.7)
Neutrophils Relative %: 66 %
Platelets: 181 10*3/uL (ref 150–400)
RBC: 4.75 MIL/uL (ref 3.87–5.11)
RDW: 15.8 % — ABNORMAL HIGH (ref 11.5–15.5)
WBC: 6.2 10*3/uL (ref 4.0–10.5)
nRBC: 0 % (ref 0.0–0.2)

## 2018-09-25 LAB — TROPONIN I (HIGH SENSITIVITY): Troponin I (High Sensitivity): 22 ng/L — ABNORMAL HIGH (ref <18)

## 2018-09-25 LAB — BRAIN NATRIURETIC PEPTIDE: B Natriuretic Peptide: 1771 pg/mL — ABNORMAL HIGH (ref 0.0–100.0)

## 2018-09-25 MED ORDER — TRAMADOL HCL 50 MG PO TABS: 50.0000 mg | ORAL_TABLET | Freq: Four times a day (QID) | ORAL | 0 refills | Status: DC | PRN

## 2018-09-25 MED ORDER — IOHEXOL 350 MG/ML SOLN
100.0000 mL | Freq: Once | INTRAVENOUS | Status: AC | PRN
  Administered 2018-09-25: 100 mL via INTRAVENOUS

## 2018-09-25 MED ORDER — FUROSEMIDE 10 MG/ML IJ SOLN
40.0000 mg | Freq: Once | INTRAMUSCULAR | Status: AC
  Administered 2018-09-25: 40 mg via INTRAVENOUS
  Filled 2018-09-25: qty 4

## 2018-09-25 NOTE — ED Notes (Signed)
Bill RN attempted IV access x2 as well as Counsellor

## 2018-09-25 NOTE — ED Triage Notes (Signed)
Pt to ED via POV c/o fluid retention. Pt is having swelling and weeping of both lower legs. Pt also has a significant amount of swelling in her left arm. Pt reports increased shortness of breath but pt is able to speak in complete sentences at this time. Pt saw Dr. Grayland Ormond yesterday but states that he did not address the swelling she is having. Pt is in NAD at this time.

## 2018-09-25 NOTE — ED Notes (Signed)
IV team at bedside 

## 2018-09-25 NOTE — ED Notes (Signed)
Patient transported to CT 

## 2018-09-25 NOTE — ED Provider Notes (Signed)
[facility] Emergency Department Provider Note  ____________________________________________  Time seen: Approximately 4:21 PM  I have reviewed the triage vital signs and the nursing notes.   HISTORY  Chief Complaint Edema    HPI Julia Gordon is a 84 y.o. female who presents the emergency department with her daughter for complaint of bilateral lower extremity edema, left upper extremity edema.  Patient has a history of CHF and has been had progressively worsening edema of the bilateral lower extremities and left upper extremity.  Patient has a significant medical history for anemia, breast cancer, COPD, DVT, congestive heart failure, hypertension, hyperlipidemia, paroxysmal atrial fibrillation.  Patient reports that she has experience slowly worsening edema over the past several weeks.  Patient denies any chest pain or shortness of breath.  She denies any headache, neck pain or stiffness, fevers or chills, nasal congestion, sore throat, cough, abdominal pain, nausea vomiting.  Patient is on Lasix for her CHF.  She does have cardiology follow-up in 4 days time.         Past Medical History:  Diagnosis Date  . Anemia   . Breast cancer ([place]) 2015   a. L mastectomy with chemoradiation  . Colon adenocarcinoma ([place])   . COPD (chronic obstructive pulmonary disease) ([place])   . Depression   . DJD (degenerative joint disease) of knee    right knee  . DVT (deep venous thrombosis) ([place])    a. 06/2018 L popliteal and R peroneal DVTs-->Xarelto.  . H/O hysterectomy with oophorectomy    for DUB  . HFrEF (heart failure with reduced ejection fraction) ([place])    a. 06/2018 Echo: EF 25-30%.  . History of depression   . HLD (hyperlipidemia)   . HTN (hypertension)   . LV (left ventricular) mural thrombus    history of, resolved (echo 04/09)  . NICM (nonischemic cardiomyopathy) ([place])    a. Reports prior h/o cath @ [facility]; b. status post [place] with [employer] lead in  2009 at [facility], Dr. Boyd Kerbs; c. 2014 - prev seen by G. Lovena Le, MD; c. 06/2018 Echo: EF 25-30%, DD. Diff HK. RVSP 10mmHg. Mildly dil RA. Mild to mod MR. Mod TR. Mild AI.  Marland Kitchen Persistent atrial fibrillation    a. noted in Care Everywhere from 2008-2009; b. CHADS2VASc 6 (CHF, HTN, age x 2, vascular disease, female)-->eliquis added 06/2018 in setting of LLE DVT.  . Valvular heart disease    a. 06/2018 Echo: Mod TR, mild to mod MR, mild AI.    Patient Active Problem List   Diagnosis Date Noted  . Persistent atrial fibrillation 09/23/2018  . Skin ulcer, limited to breakdown of skin ([place]) 09/23/2018  . Left arm swelling 09/23/2018  . Acute deep vein thrombosis (DVT) of left lower extremity ([place]) 06/27/2018  . Age-related osteoporosis with current pathological fracture with routine healing 07/24/2017  . Breast cancer ([place]) 07/24/2017  . Femur fracture ([place]) 07/20/2017  . HTN (hypertension) 07/20/2017  . PAF (paroxysmal atrial fibrillation) ([place]) 07/20/2017  . Primary cancer of upper inner quadrant of left female breast ([place]) 07/17/2014  . Acute on chronic HFrEF (heart failure with reduced ejection fraction) ([place]) 03/24/2011  . ICD (implantable cardiac defibrillator) in place 03/24/2011  . Dyslipidemia 03/24/2011  . Other primary cardiomyopathies     Past Surgical History:  Procedure Laterality Date  . ABDOMINAL HYSTERECTOMY    . BREAST BIOPSY Left 05/10/13   [place] bx-positive  . COLONOSCOPY WITH PROPOFOL N/A 01/15/2017   Procedure: COLONOSCOPY WITH PROPOFOL;  Surgeon: Jonathon Bellows, MD;  Location: [facility] ENDOSCOPY;  Service: Gastroenterology;  Laterality: N/A;  . LAPAROSCOPIC RIGHT COLON RESECTION  08/10   Dr Pat Patrick, for adenoca   . MASTECTOMY Left 2015   BREAST CA  . OOPHORECTOMY    . ORIF FEMUR FRACTURE Right 07/21/2017   Procedure: OPEN REDUCTION INTERNAL FIXATION (ORIF) DISTAL FEMUR FRACTURE;  Surgeon: Lovell Sheehan, MD;  Location: [facility] ORS;  Service: Orthopedics;  Laterality: Right;  femur   .  PACEMAKER INSERTION  2009  . REPLACEMENT TOTAL KNEE  1990's   right     Prior to Admission medications   Medication Sig Start Date End Date Taking? Authorizing Provider  alendronate (FOSAMAX) 70 MG tablet TAKE 1 TAB WEEKLY 30 MINS BEFORE BREAKFAST WITH A FULL GLASS OF WATER. DO NOT LIE DOWN FOR 30 MINS 10/25/17   Lloyd Huger, MD  amiodarone (PACERONE) 200 MG tablet Take 1 tablet (200 mg total) by mouth 2 (two) times daily. 09/09/18   Theora Gianotti, NP  furosemide (LASIX) 20 MG tablet Take 40 mg (2 tablets) by mouth every morning, take 1 tablet (20 mg total) once in the afternoon. 09/23/18   End, Harrell Gave, MD  lovastatin (MEVACOR) 40 MG tablet Take 2 tablets by mouth Daily. 01/31/11   [provider]  metoprolol succinate (TOPROL-XL) 50 MG 24 hr tablet Take 1 tablet (50 mg total) by mouth daily. 09/23/18   End, Harrell Gave, MD  MULTIPLE VITAMIN PO Take 1 tablet by mouth daily.     [provider]  rivaroxaban (XARELTO) 15 MG TABS tablet Take 1 tablet (15 mg total) by mouth daily with supper. 09/23/18   End, Harrell Gave, MD  traMADol (ULTRAM) 50 MG tablet Take 1 tablet (50 mg total) by mouth every 6 (six) hours as needed. 09/25/18   , Charline Bills, PA-C    Allergies No known allergies  Family History  Problem Relation Age of Onset  . Hypertension Father   . Alzheimer's disease Mother   . Breast cancer Sister 20  . Diabetes Other        brothers  . Stroke Brother   . Heart failure Brother   . Lupus Sister     Social History Social History   Tobacco Use  . Smoking status: Former Research scientist (life sciences)  . Smokeless tobacco: Never Used  . Tobacco comment: quit 2006  Substance Use Topics  . Alcohol use: No    Comment: former  . Drug use: No     Review of Systems  Constitutional: No fever/chills Eyes: No visual changes. No discharge ENT: No upper respiratory complaints. Cardiovascular: no chest pain.  Positive for bilateral lower extremity edema,  left upper extremity edema. Respiratory: no cough. No SOB. Gastrointestinal: No abdominal pain.  No nausea, no vomiting.  No diarrhea.  No constipation. Genitourinary: Negative for dysuria. No hematuria Musculoskeletal: Negative for musculoskeletal pain. Skin: Negative for rash, abrasions, lacerations, ecchymosis. Neurological: Negative for headaches, focal weakness or numbness. 10-point ROS otherwise negative.  ____________________________________________   PHYSICAL EXAM:  VITAL SIGNS: ED Triage Vitals  Enc Vitals Group     BP 09/25/18 1535 126/79     Pulse Rate 09/25/18 1535 90     Resp 09/25/18 1535 16     Temp 09/25/18 1540 (!) 97.3 F (36.3 C)     Temp Source 09/25/18 1535 Oral     SpO2 09/25/18 1535 100 %     Weight 09/25/18 1535 181 lb 7 oz (82.3 kg)  Height 09/25/18 1535 5\' 2"  (1.575 m)     Head Circumference --      Peak Flow --      Pain Score 09/25/18 1535 0     Pain Loc --      Pain Edu? --      Excl. in [place]? --      Constitutional: Alert and oriented. Well appearing and in no acute distress. Eyes: Conjunctivae are normal. PERRL. EOMI. Head: Atraumatic. ENT:      Ears:       Nose: No congestion/rhinnorhea.      Mouth/Throat: Mucous membranes are moist.  Neck: No stridor.   Hematological/Lymphatic/Immunilogical: No cervical lymphadenopathy. Cardiovascular: Normal rate, regular rhythm. Normal S1 and S2.  Visualization of bilateral lower extremities reveals edema bilaterally.  This is mild to moderate.  Nonpitting.  Patient does have some weeping of bilateral lower extremities.  Patient has several scattered venous stasis ulcers to the left lower extremity.  Both lower extremities are cool to the touch, however left lower extremity is colder than the right.  Assessing for dorsalis pedis pulse reveals pulse appreciated on the right side but none on the left.  Use of Doppler to the left lower extremity reveals no appreciable dorsalis pedis pulse.  Sensation intact  bilateral lower extremities.  Visualization of the left upper extremity reveals mild edema when compared with right.  No erythema.  Radial pulse intact.  Sensation intact bilaterally. Respiratory: Normal respiratory effort without tachypnea or retractions. Lungs CTAB. Good air entry to the bases with no decreased or absent breath sounds. Musculoskeletal: Full range of motion to all extremities. No gross deformities appreciated. Neurologic:  Normal speech and language. No gross focal neurologic deficits are appreciated.  Skin:  Skin is warm, dry and intact. No rash noted. Psychiatric: Mood and affect are normal. Speech and behavior are normal. Patient exhibits appropriate insight and judgement.   ____________________________________________   LABS (all labs ordered are listed, but only abnormal results are displayed)  Labs Reviewed  CBC WITH DIFFERENTIAL/PLATELET - Abnormal; Notable for the following components:      Result Value   RDW 15.8 (*)    All other components within normal limits  COMPREHENSIVE METABOLIC PANEL - Abnormal; Notable for the following components:   Potassium 3.2 (*)    Glucose, Bld 102 (*)    BUN 34 (*)    Creatinine, Ser 1.42 (*)    Calcium 8.5 (*)    Total Protein 6.2 (*)    Albumin 3.1 (*)    GFR calc non Af Amer 34 (*)    GFR calc Af Amer 40 (*)    All other components within normal limits  BRAIN NATRIURETIC PEPTIDE - Abnormal; Notable for the following components:   B Natriuretic Peptide 1,771.0 (*)    All other components within normal limits  TROPONIN I (HIGH SENSITIVITY) - Abnormal; Notable for the following components:   Troponin I (High Sensitivity) 22 (*)    All other components within normal limits  TROPONIN I (HIGH SENSITIVITY)   ____________________________________________  EKG   ____________________________________________  RADIOLOGY I personally viewed and evaluated these images as part of my medical decision making, as well as  reviewing the written report by the radiologist.  Dg Chest Portable 1 View  Result Date: 09/25/2018 CLINICAL DATA:  Shortness of breath EXAM: PORTABLE CHEST 1 VIEW COMPARISON:  06/27/2018 FINDINGS: Right-sided chest port and left implanted cardiac device remain in place. Stable mild cardiomegaly. Calcified thoracic aorta. No focal  airspace consolidation. No pleural effusion or pneumothorax. IMPRESSION: Stable mild cardiomegaly without acute cardiopulmonary process. Electronically Signed   By: Davina Poke M.D.   On: 09/25/2018 16:15    ____________________________________________    PROCEDURES  Procedure(s) performed:    Procedures    Medications  iohexol (OMNIPAQUE) 350 MG/ML injection 100 mL (100 mLs Intravenous Contrast Given 09/25/18 1945)  furosemide (LASIX) injection 40 mg (40 mg Intravenous Given 09/25/18 2040)     ____________________________________________   INITIAL IMPRESSION / ASSESSMENT AND PLAN / ED COURSE  Pertinent labs & imaging results that were available during my care of the patient were reviewed by me and considered in my medical decision making (see chart for details).  Review of the [name]  CSRS was performed in accordance of the [place] prior to dispensing any controlled drugs.  Clinical Course as of Sep 24 2049  Sat Sep 25, 2018  2020 Patient presented to emergency department complaining of bilateral lower extremity, left arm edema.  Patient has a history of CHF.  I suspected that patient was having mildly increased CHF symptoms and labs, chest x-ray, EKG were ordered.  Labs are largely at patient's baseline.  Of concern, when assessing peripheral vascular status, difficulty ascertaining dorsalis pedis pulse on the left was appreciated.  Doppler was unable to assess as well.  Using ankle-brachial index using manual cough, ABI for right side is 0.80, left side is 0.65.  Patient had been stabbed multiple times attempting to establish IV access, with no success.   Attempting access for CT angiogram of the lower extremities.  However at this time with inability to maintain IV access, with ABI of 0.65 on the left, I will refer to vascular for follow-up and not pursue imaging at this time.  Patient is given Lasix here in the emergency department.  Close follow-up is already scheduled with her cardiologist.   [JC]    Clinical Course User Index [JC] , Charline Bills, PA-C          Patient's diagnosis is consistent with peripheral vascular disease, CHF.  Patient presented to the emergency department with increased edema to the bilateral lower extremities and left upper arm.  This is slowly been progressing.  There was concern initially as had difficulty finding dorsalis pedis pulse.  Using the ABIs, patient has ABI of 0.65 on the left, 0.80 on the right.  At this time we were unable to maintain IV access for angiography of the lower extremities.  Given the fact that patient's ABIs are only on the moderate range, imaging will be foregone but patient will follow-up with vascular surgery.  Return precautions are discussed with the patient and family member.  They verbalized understanding of same.  Patient has close follow-up with cardiology in 3 days.  Patient is given IV dose of Lasix at 40 mg.  I will not change the patient's dosing of Lasix or add other medications at this time as patient already has close follow-up with cardiology..  Patient is given ED precautions to return to the ED for any worsening or new symptoms.     ____________________________________________  FINAL CLINICAL IMPRESSION(S) / ED DIAGNOSES  Final diagnoses:  Chronic systolic heart failure ([place])  PVD (peripheral vascular disease) ([place])      NEW MEDICATIONS STARTED DURING THIS VISIT:  ED Discharge Orders         Ordered    traMADol (ULTRAM) 50 MG tablet  Every 6 hours PRN     09/25/18 2033  This chart was dictated using voice recognition software/Dragon.  Despite best efforts to proofread, errors can occur which can change the meaning. Any change was purely unintentional.    Darletta Moll, PA-C 09/25/18 2051    Blake Divine, MD 09/26/18 [phone]

## 2018-09-27 NOTE — Telephone Encounter (Signed)
Patient is calling regarding wheelchair order.

## 2018-09-28 NOTE — Telephone Encounter (Signed)
Called and spoke with patient and granddaughter, Julia Gordon. They thought [place] was going to fax over a Mobility Assessment form.  All we have received is a [employer] pump order and assessment form. They said this was recommended by [facility] as well to help with leg swelling.  Advised I would call [employer] to have them refax the Mobility form to help with the wheelchair and call them once all forms are reviewed by Dr End and let them know the outcome. They were appreciative.  Called [employer] and they are refaxing the wheelchair mobility form today.

## 2018-09-28 NOTE — Progress Notes (Signed)
Follow-up Outpatient Visit Date: 09/29/2018  Primary Care Provider: Ellamae Sia, MD [place] [address]  Chief Complaint: Leg swellinig  HPI:  Julia Gordon is a 84 y.o. year-old female with history of chronic HFrEF secondary to NICM complicated by LV thrombus (2009), persistent atrial fibrillation, bilateral breast cancer, hypertension, hyperlipidemia, and LLE DVT on rivaroxaban, who presents for follow-up of heart failure and atrial fibrillation.  I saw her a week ago, at which time it was unclear which medications she was actually taking.  We clarified which medications she should be on, including furosemide 40 mg QAM and 20 mg QPM.  We also stopped isosorbide mononitrate, amlodipine, and lisinopril, as well as potassium chloride.  We agreed to restart metoprolol succinate 50 mg daily and rivaroxaban (albeit at 15 mg daily given GFR < 50).  Julia Gordon complained of worsening leg swelling as well as a new wound along the back of her ankle that had started draining clear fluid a day or two before our visit.  Arrangements were made for ABI's and wound care clinic visit (both yet to occur).  Left arm edema was also a concern.  Julia Gordon presented to the [facility] ED two days later (09/25/2018) complaining of leg swelling.  It appears by bedside ABI's were performed.  CTA with runoff could not be performed due to inadequate IV access.  She was given furosemide 40 mg IV x 1 and advised to follow-up with [place] today.  Today, Julia Gordon and her daughter note improvement in bilateral leg swelling, though multiple wounds with clear fluid drainage are still present.  Julia Gordon has been compliant with her medications, though there is still some confusion about exactly which medications she is taking (unclear if still on amiodarone or not).  She denies chest pain, shortness of breath, and palpitations.  She has limited mobility; we had previously prescribed a wheelchair but his was not  approved by her insurance as further mobility assessment is needed.  She denies bleeding, having restarted apixaban after our last visit.  --------------------------------------------------------------------------------------------------  Past Medical History:  Diagnosis Date  . Anemia   . Breast cancer ([place]) 2015   a. L mastectomy with chemoradiation  . Colon adenocarcinoma ([place])   . COPD (chronic obstructive pulmonary disease) ([place])   . Depression   . DJD (degenerative joint disease) of knee    right knee  . DVT (deep venous thrombosis) ([place])    a. 06/2018 L popliteal and R peroneal DVTs-->Xarelto.  . H/O hysterectomy with oophorectomy    for DUB  . HFrEF (heart failure with reduced ejection fraction) ([place])    a. 06/2018 Echo: EF 25-30%.  . History of depression   . HLD (hyperlipidemia)   . HTN (hypertension)   . LV (left ventricular) mural thrombus    history of, resolved (echo 04/09)  . NICM (nonischemic cardiomyopathy) ([place])    a. Reports prior h/o cath @ [facility]; b. status post [place] with [employer] lead in 2009 at [facility], Dr. Boyd Kerbs; c. 2014 - prev seen by G. Lovena Le, MD; c. 06/2018 Echo: EF 25-30%, DD. Diff HK. RVSP 68mmHg. Mildly dil RA. Mild to mod MR. Mod TR. Mild AI.  Marland Kitchen Persistent atrial fibrillation    a. noted in Care Everywhere from 2008-2009; b. CHADS2VASc 6 (CHF, HTN, age x 2, vascular disease, female)-->eliquis added 06/2018 in setting of LLE DVT.  . Valvular heart disease    a. 06/2018 Echo: Mod TR, mild to  mod MR, mild AI.   Past Surgical History:  Procedure Laterality Date  . ABDOMINAL HYSTERECTOMY    . BREAST BIOPSY Left 05/10/13   [place] bx-positive  . COLONOSCOPY WITH PROPOFOL N/A 01/15/2017   Procedure: COLONOSCOPY WITH PROPOFOL;  Surgeon: Jonathon Bellows, MD;  Location: [facility] ENDOSCOPY;  Service: Gastroenterology;  Laterality: N/A;  . LAPAROSCOPIC RIGHT COLON RESECTION  08/10   Dr Pat Patrick, for adenoca   . MASTECTOMY Left 2015   BREAST CA  . OOPHORECTOMY    . ORIF  FEMUR FRACTURE Right 07/21/2017   Procedure: OPEN REDUCTION INTERNAL FIXATION (ORIF) DISTAL FEMUR FRACTURE;  Surgeon: Lovell Sheehan, MD;  Location: [facility] ORS;  Service: Orthopedics;  Laterality: Right;  femur   . PACEMAKER INSERTION  2009  . REPLACEMENT TOTAL KNEE  1990's   right     Current Meds  Medication Sig  . alendronate (FOSAMAX) 70 MG tablet TAKE 1 TAB WEEKLY 30 MINS BEFORE BREAKFAST WITH A FULL GLASS OF WATER. DO NOT LIE DOWN FOR 30 MINS  . furosemide (LASIX) 20 MG tablet Take 40 mg (2 tablets) by mouth every morning, take 1 tablet (20 mg total) once in the afternoon.  . lovastatin (MEVACOR) 40 MG tablet Take 2 tablets by mouth Daily.  . metoprolol succinate (TOPROL-XL) 50 MG 24 hr tablet Take 1 tablet (50 mg total) by mouth daily.  . rivaroxaban (XARELTO) 15 MG TABS tablet Take 1 tablet (15 mg total) by mouth daily with supper.  . traMADol (ULTRAM) 50 MG tablet Take 1 tablet (50 mg total) by mouth every 6 (six) hours as needed.  . [DISCONTINUED] amiodarone (PACERONE) 200 MG tablet Take 1 tablet (200 mg total) by mouth 2 (two) times daily.  . [DISCONTINUED] MULTIPLE VITAMIN PO Take 1 tablet by mouth daily.     Allergies: No known allergies  Social History   Tobacco Use  . Smoking status: Former Research scientist (life sciences)  . Smokeless tobacco: Never Used  . Tobacco comment: quit 2006  Substance Use Topics  . Alcohol use: No    Comment: former  . Drug use: No    Family History  Problem Relation Age of Onset  . Hypertension Father   . Alzheimer's disease Mother   . Breast cancer Sister 12  . Diabetes Other        brothers  . Stroke Brother   . Heart failure Brother   . Lupus Sister     Review of Systems: A 12-system review of systems was performed and was negative except as noted in the HPI.  --------------------------------------------------------------------------------------------------  Physical Exam: BP 110/88 (BP Location: Right Arm, Patient Position: Sitting, Cuff Size:  Normal)   Pulse 83   Ht 5\' 5"  (1.651 m)   Wt 174 lb 8 oz (79.2 kg)   BMI 29.04 kg/m   General:  Frail, elderly woman seated in wheelchair.  She is accompanied by her daughter. HEENT: No conjunctival pallor or scleral icterus.  Neck: Supple without lymphadenopathy or thyromegaly.  JVP ~8-10 cm. Lungs: Normal work of breathing. Mildly diminished breath sounds throughout. Heart: Irregularly irregular without murmurs. Abd: Bowel sounds present. Soft, NT/ND without hepatosplenomegaly Ext: 1+ pretibial edema bilaterally.  Pedal pulses not palpable in either foot. Skin: Multiple superficial ulcerations noted on both calves, some with serous drainage.  Hyperpigmentation of left shin noted.  EKG:  Atrial fibrillation (ventricular rate 75 bpm), left axis deviation, and nonspecific T wave abnormality.  Lab Results  Component Value Date   WBC 6.2 09/25/2018  HGB 14.5 09/25/2018   HCT 44.4 09/25/2018   MCV 93.5 09/25/2018   PLT 181 09/25/2018    Lab Results  Component Value Date   NA 139 09/25/2018   K 3.2 (L) 09/25/2018   CL 105 09/25/2018   CO2 24 09/25/2018   BUN 34 (H) 09/25/2018   CREATININE 1.42 (H) 09/25/2018   GLUCOSE 102 (H) 09/25/2018   ALT 26 09/25/2018    No results found for: CHOL, HDL, LDLCALC, LDLDIRECT, TRIG, CHOLHDL  --------------------------------------------------------------------------------------------------  ASSESSMENT AND PLAN: Chronic systolic heart failure: Leg swelling improved with 7 pound weight loss since last week.   There is still considerable BLE swelling and weeping from superficial ulcers in the setting of NYHA class III symptoms.  I recommend continuation of furosemide 40 mg QAM and 20 mg QPM.  Due to recent hypokalemia in the ED, we will add potassium chloride 20 mEq daily.  BMP will be repeated before follow-up visit in 2 weeks.  We will continue current dose of metoprolol succinate 50 mg daily.  If renal function remains stable and blood  pressure tolerates, trial of ACEI/ARB/ARNI will need to be considered at follow-up.  Persistent atrial fibrillation: Heart rate adequately controlled on current dose of metoprolol.  Patient has been compliant with rivaroxaban for the last week.  We will stop amiodarone for now, as she had not been taking anticoagulation faithfully until a week ago.  We could consider elective cardioversion once she has completed at least 4 weeks of therapeutic anticoagulation.  Bilateral lower extremity ulcers: Stable since last week.  Patient is scheduled to be seen in the wound care clinic in 2 days.  I encouraged leg elevation and dressing with petroleum jelly + gauze pending wound care visit.  Lower extremity arterial Dopplers today show no evidence of significant PAD.  Abnormal ABI's noted in the ED and diminished pedal pulses may be due to vascular calcification.  Left arm swelling: Most likely due to lymphedema.  Continue diuretic regimen.  Patient encouraged to speak with wound care clinic, PCP, and/or oncologist regarding potential for lymphedema pump.  Follow-up: RTC 2 weeks.  Nelva Bush, MD 09/30/2018 11:14 AM

## 2018-09-28 NOTE — Telephone Encounter (Signed)
Please call to discuss wheelchair. Patient is not sure if pt needs wheechair or "pump for her leg". Please advise.

## 2018-09-29 ENCOUNTER — Ambulatory Visit
Admission: RE | Admit: 2018-09-29 | Discharge: 2018-09-29 | Disposition: A | Discharge status: Home / Self Care | Payer: Medicare Other | Source: Ambulatory Visit | Attending: Internal Medicine | Admitting: Internal Medicine

## 2018-09-29 ENCOUNTER — Other Ambulatory Visit: Payer: Self-pay

## 2018-09-29 ENCOUNTER — Ambulatory Visit (INDEPENDENT_AMBULATORY_CARE_PROVIDER_SITE_OTHER): Payer: Medicare Other | Admitting: Internal Medicine

## 2018-09-29 ENCOUNTER — Encounter: Payer: Self-pay | Admitting: Internal Medicine

## 2018-09-29 VITALS — BP 110/88 | HR 83 | Ht 65.0 in | Wt 174.5 lb

## 2018-09-29 DIAGNOSIS — M7989 Other specified soft tissue disorders: Secondary | ICD-10-CM | POA: Diagnosis not present

## 2018-09-29 DIAGNOSIS — G9341 Metabolic encephalopathy: Secondary | ICD-10-CM | POA: Diagnosis not present

## 2018-09-29 DIAGNOSIS — Z79899 Other long term (current) drug therapy: Secondary | ICD-10-CM

## 2018-09-29 DIAGNOSIS — R400 Somnolence: Secondary | ICD-10-CM | POA: Diagnosis not present

## 2018-09-29 DIAGNOSIS — L97929 Non-pressure chronic ulcer of unspecified part of left lower leg with unspecified severity: Secondary | ICD-10-CM

## 2018-09-29 DIAGNOSIS — L98491 Non-pressure chronic ulcer of skin of other sites limited to breakdown of skin: Secondary | ICD-10-CM

## 2018-09-29 DIAGNOSIS — I5022 Chronic systolic (congestive) heart failure: Secondary | ICD-10-CM

## 2018-09-29 DIAGNOSIS — L97919 Non-pressure chronic ulcer of unspecified part of right lower leg with unspecified severity: Secondary | ICD-10-CM | POA: Diagnosis not present

## 2018-09-29 DIAGNOSIS — I4819 Other persistent atrial fibrillation: Secondary | ICD-10-CM | POA: Diagnosis not present

## 2018-09-29 MED ORDER — POTASSIUM CHLORIDE CRYS ER 20 MEQ PO TBCR: 20.0000 meq | EXTENDED_RELEASE_TABLET | Freq: Every day | ORAL | 3 refills | Status: AC

## 2018-09-29 NOTE — Patient Instructions (Signed)
Medication Instructions:  Your physician has recommended you make the following change in your medication:  1- STOP Amiodarone. 2- START Potassium Chloride 20 mEq by mouth once a day.  If you need a refill on your cardiac medications before your next appointment, please call your pharmacy.   Lab work: Your physician recommends that you return for lab work in: 2 weeks prior to appointment.  Please come early enough to stop in the [facility] for lab work first, then come down to our office for you appointment.   If you have labs (blood work) drawn today and your tests are completely normal, you will receive your results only by: Marland Kitchen MyChart Message (if you have MyChart) OR . A paper copy in the mail If you have any lab test that is abnormal or we need to change your treatment, we will call you to review the results.  Testing/Procedures: NONE  Follow-Up: At [facility], you and your health needs are our priority.  As part of our continuing mission to provide you with exceptional heart care, we have created designated Provider Care Teams.  These Care Teams include your primary Cardiologist (physician) and Advanced Practice Providers (APPs -  Physician Assistants and Nurse Practitioners) who all work together to provide you with the care you need, when you need it. You will need a follow up appointment in 2 weeks.   You may see Nelva Bush, MD or one of the following Advanced Practice Providers on your designated Care Team:   Murray Hodgkins, NP Christell Faith, PA-C . Marrianne Mood, PA-C

## 2018-09-29 NOTE — Telephone Encounter (Signed)
Patient saw Dr End in the office today. He is deferring the wheelchair need to PCP if they feel appropriate. He will not order lymphedema pumps at this time.

## 2018-09-30 ENCOUNTER — Encounter: Payer: Self-pay | Admitting: Internal Medicine

## 2018-10-01 ENCOUNTER — Inpatient Hospital Stay
Admission: EM | Admit: 2018-10-01 | Discharge: 2018-10-08 | DRG: 037 | Disposition: A | Discharge status: Skilled Nursing Facility | Payer: Medicare Other | Source: Ambulatory Visit | Attending: Internal Medicine | Admitting: Internal Medicine

## 2018-10-01 ENCOUNTER — Emergency Department: Payer: Medicare Other

## 2018-10-01 ENCOUNTER — Other Ambulatory Visit: Payer: Self-pay

## 2018-10-01 ENCOUNTER — Encounter: Payer: Medicare Other | Attending: Physician Assistant | Admitting: Physician Assistant

## 2018-10-01 ENCOUNTER — Encounter: Payer: Self-pay | Admitting: Emergency Medicine

## 2018-10-01 DIAGNOSIS — Z85038 Personal history of other malignant neoplasm of large intestine: Secondary | ICD-10-CM

## 2018-10-01 DIAGNOSIS — Z832 Family history of diseases of the blood and blood-forming organs and certain disorders involving the immune mechanism: Secondary | ICD-10-CM

## 2018-10-01 DIAGNOSIS — Z853 Personal history of malignant neoplasm of breast: Secondary | ICD-10-CM

## 2018-10-01 DIAGNOSIS — M810 Age-related osteoporosis without current pathological fracture: Secondary | ICD-10-CM | POA: Diagnosis present

## 2018-10-01 DIAGNOSIS — I480 Paroxysmal atrial fibrillation: Secondary | ICD-10-CM | POA: Diagnosis present

## 2018-10-01 DIAGNOSIS — N179 Acute kidney failure, unspecified: Secondary | ICD-10-CM | POA: Diagnosis not present

## 2018-10-01 DIAGNOSIS — E785 Hyperlipidemia, unspecified: Secondary | ICD-10-CM | POA: Diagnosis present

## 2018-10-01 DIAGNOSIS — Z86718 Personal history of other venous thrombosis and embolism: Secondary | ICD-10-CM

## 2018-10-01 DIAGNOSIS — Z23 Encounter for immunization: Secondary | ICD-10-CM

## 2018-10-01 DIAGNOSIS — I5023 Acute on chronic systolic (congestive) heart failure: Secondary | ICD-10-CM | POA: Diagnosis not present

## 2018-10-01 DIAGNOSIS — Z20828 Contact with and (suspected) exposure to other viral communicable diseases: Secondary | ICD-10-CM | POA: Diagnosis present

## 2018-10-01 DIAGNOSIS — I70235 Atherosclerosis of native arteries of right leg with ulceration of other part of foot: Secondary | ICD-10-CM | POA: Diagnosis not present

## 2018-10-01 DIAGNOSIS — Z7983 Long term (current) use of bisphosphonates: Secondary | ICD-10-CM | POA: Diagnosis not present

## 2018-10-01 DIAGNOSIS — I70248 Atherosclerosis of native arteries of left leg with ulceration of other part of lower left leg: Secondary | ICD-10-CM | POA: Diagnosis present

## 2018-10-01 DIAGNOSIS — Z803 Family history of malignant neoplasm of breast: Secondary | ICD-10-CM

## 2018-10-01 DIAGNOSIS — Z823 Family history of stroke: Secondary | ICD-10-CM

## 2018-10-01 DIAGNOSIS — Z7901 Long term (current) use of anticoagulants: Secondary | ICD-10-CM | POA: Diagnosis not present

## 2018-10-01 DIAGNOSIS — Z96651 Presence of right artificial knee joint: Secondary | ICD-10-CM | POA: Diagnosis present

## 2018-10-01 DIAGNOSIS — Z923 Personal history of irradiation: Secondary | ICD-10-CM | POA: Diagnosis not present

## 2018-10-01 DIAGNOSIS — I5022 Chronic systolic (congestive) heart failure: Secondary | ICD-10-CM | POA: Diagnosis present

## 2018-10-01 DIAGNOSIS — J9621 Acute and chronic respiratory failure with hypoxia: Secondary | ICD-10-CM | POA: Diagnosis not present

## 2018-10-01 DIAGNOSIS — Z8249 Family history of ischemic heart disease and other diseases of the circulatory system: Secondary | ICD-10-CM

## 2018-10-01 DIAGNOSIS — Z515 Encounter for palliative care: Secondary | ICD-10-CM | POA: Diagnosis not present

## 2018-10-01 DIAGNOSIS — I70243 Atherosclerosis of native arteries of left leg with ulceration of ankle: Secondary | ICD-10-CM | POA: Diagnosis not present

## 2018-10-01 DIAGNOSIS — N183 Chronic kidney disease, stage 3 unspecified: Secondary | ICD-10-CM | POA: Diagnosis present

## 2018-10-01 DIAGNOSIS — I4819 Other persistent atrial fibrillation: Secondary | ICD-10-CM | POA: Diagnosis present

## 2018-10-01 DIAGNOSIS — Z95810 Presence of automatic (implantable) cardiac defibrillator: Secondary | ICD-10-CM

## 2018-10-01 DIAGNOSIS — Z9221 Personal history of antineoplastic chemotherapy: Secondary | ICD-10-CM | POA: Diagnosis not present

## 2018-10-01 DIAGNOSIS — Z9012 Acquired absence of left breast and nipple: Secondary | ICD-10-CM

## 2018-10-01 DIAGNOSIS — I340 Nonrheumatic mitral (valve) insufficiency: Secondary | ICD-10-CM | POA: Diagnosis not present

## 2018-10-01 DIAGNOSIS — I70239 Atherosclerosis of native arteries of right leg with ulceration of unspecified site: Secondary | ICD-10-CM | POA: Diagnosis not present

## 2018-10-01 DIAGNOSIS — L97829 Non-pressure chronic ulcer of other part of left lower leg with unspecified severity: Secondary | ICD-10-CM | POA: Diagnosis present

## 2018-10-01 DIAGNOSIS — I428 Other cardiomyopathies: Secondary | ICD-10-CM | POA: Diagnosis present

## 2018-10-01 DIAGNOSIS — Z820 Family history of epilepsy and other diseases of the nervous system: Secondary | ICD-10-CM

## 2018-10-01 DIAGNOSIS — J449 Chronic obstructive pulmonary disease, unspecified: Secondary | ICD-10-CM | POA: Diagnosis present

## 2018-10-01 DIAGNOSIS — R400 Somnolence: Secondary | ICD-10-CM | POA: Diagnosis present

## 2018-10-01 DIAGNOSIS — Z87891 Personal history of nicotine dependence: Secondary | ICD-10-CM

## 2018-10-01 DIAGNOSIS — I130 Hypertensive heart and chronic kidney disease with heart failure and stage 1 through stage 4 chronic kidney disease, or unspecified chronic kidney disease: Secondary | ICD-10-CM | POA: Diagnosis present

## 2018-10-01 DIAGNOSIS — E876 Hypokalemia: Secondary | ICD-10-CM | POA: Diagnosis not present

## 2018-10-01 DIAGNOSIS — G9341 Metabolic encephalopathy: Secondary | ICD-10-CM | POA: Diagnosis present

## 2018-10-01 DIAGNOSIS — S81802A Unspecified open wound, left lower leg, initial encounter: Secondary | ICD-10-CM

## 2018-10-01 DIAGNOSIS — I509 Heart failure, unspecified: Secondary | ICD-10-CM | POA: Diagnosis not present

## 2018-10-01 DIAGNOSIS — I639 Cerebral infarction, unspecified: Secondary | ICD-10-CM | POA: Diagnosis present

## 2018-10-01 DIAGNOSIS — I10 Essential (primary) hypertension: Secondary | ICD-10-CM | POA: Diagnosis not present

## 2018-10-01 DIAGNOSIS — L8990 Pressure ulcer of unspecified site, unspecified stage: Secondary | ICD-10-CM | POA: Insufficient documentation

## 2018-10-01 DIAGNOSIS — I361 Nonrheumatic tricuspid (valve) insufficiency: Secondary | ICD-10-CM | POA: Diagnosis not present

## 2018-10-01 DIAGNOSIS — I70245 Atherosclerosis of native arteries of left leg with ulceration of other part of foot: Secondary | ICD-10-CM | POA: Diagnosis not present

## 2018-10-01 LAB — URINE DRUG SCREEN, QUALITATIVE (ARMC ONLY)
Amphetamines, Ur Screen: NOT DETECTED
Barbiturates, Ur Screen: NOT DETECTED
Benzodiazepine, Ur Scrn: NOT DETECTED
Cannabinoid 50 Ng, Ur ~~LOC~~: NOT DETECTED
Cocaine Metabolite,Ur ~~LOC~~: NOT DETECTED
MDMA (Ecstasy)Ur Screen: NOT DETECTED
Methadone Scn, Ur: NOT DETECTED
Opiate, Ur Screen: NOT DETECTED
Phencyclidine (PCP) Ur S: NOT DETECTED
Tricyclic, Ur Screen: NOT DETECTED

## 2018-10-01 LAB — URINALYSIS, COMPLETE (UACMP) WITH MICROSCOPIC
Bacteria, UA: NONE SEEN
Bilirubin Urine: NEGATIVE
Glucose, UA: NEGATIVE mg/dL
Hgb urine dipstick: NEGATIVE
Ketones, ur: NEGATIVE mg/dL
Leukocytes,Ua: NEGATIVE
Nitrite: NEGATIVE
Protein, ur: 30 mg/dL — AB
Specific Gravity, Urine: 1.013 (ref 1.005–1.030)
pH: 5 (ref 5.0–8.0)

## 2018-10-01 LAB — COMPREHENSIVE METABOLIC PANEL
ALT: 23 U/L (ref 0–44)
AST: 28 U/L (ref 15–41)
Albumin: 2.9 g/dL — ABNORMAL LOW (ref 3.5–5.0)
Alkaline Phosphatase: 49 U/L (ref 38–126)
Anion gap: 10 (ref 5–15)
BUN: 35 mg/dL — ABNORMAL HIGH (ref 8–23)
CO2: 25 mmol/L (ref 22–32)
Calcium: 8.7 mg/dL — ABNORMAL LOW (ref 8.9–10.3)
Chloride: 105 mmol/L (ref 98–111)
Creatinine, Ser: 1.29 mg/dL — ABNORMAL HIGH (ref 0.44–1.00)
GFR calc Af Amer: 45 mL/min — ABNORMAL LOW (ref >60)
GFR calc non Af Amer: 39 mL/min — ABNORMAL LOW (ref >60)
Glucose, Bld: 95 mg/dL (ref 70–99)
Potassium: 3.5 mmol/L (ref 3.5–5.1)
Sodium: 140 mmol/L (ref 135–145)
Total Bilirubin: 0.9 mg/dL (ref 0.3–1.2)
Total Protein: 5.9 g/dL — ABNORMAL LOW (ref 6.5–8.1)

## 2018-10-01 LAB — CBC WITH DIFFERENTIAL/PLATELET
Abs Immature Granulocytes: 0.05 10*3/uL (ref 0.00–0.07)
Basophils Absolute: 0 10*3/uL (ref 0.0–0.1)
Basophils Relative: 1 %
Eosinophils Absolute: 0.1 10*3/uL (ref 0.0–0.5)
Eosinophils Relative: 1 %
HCT: 41.3 % (ref 36.0–46.0)
Hemoglobin: 13.5 g/dL (ref 12.0–15.0)
Immature Granulocytes: 1 %
Lymphocytes Relative: 18 %
Lymphs Abs: 1.2 10*3/uL (ref 0.7–4.0)
MCH: 30.3 pg (ref 26.0–34.0)
MCHC: 32.7 g/dL (ref 30.0–36.0)
MCV: 92.6 fL (ref 80.0–100.0)
Monocytes Absolute: 0.7 10*3/uL (ref 0.1–1.0)
Monocytes Relative: 10 %
Neutro Abs: 4.7 10*3/uL (ref 1.7–7.7)
Neutrophils Relative %: 69 %
Platelets: 166 10*3/uL (ref 150–400)
RBC: 4.46 MIL/uL (ref 3.87–5.11)
RDW: 15.8 % — ABNORMAL HIGH (ref 11.5–15.5)
WBC: 6.6 10*3/uL (ref 4.0–10.5)
nRBC: 0 % (ref 0.0–0.2)

## 2018-10-01 LAB — LACTIC ACID, PLASMA: Lactic Acid, Venous: 1.4 mmol/L (ref 0.5–1.9)

## 2018-10-01 LAB — AMMONIA: Ammonia: 27 umol/L (ref 9–35)

## 2018-10-01 LAB — GLUCOSE, CAPILLARY: Glucose-Capillary: 73 mg/dL (ref 70–99)

## 2018-10-01 LAB — SARS CORONAVIRUS 2 BY RT PCR (HOSPITAL ORDER, PERFORMED IN ~~LOC~~ HOSPITAL LAB): SARS Coronavirus 2: NEGATIVE

## 2018-10-01 LAB — PROCALCITONIN: Procalcitonin: 0.11 ng/mL

## 2018-10-01 MED ORDER — SODIUM CHLORIDE 0.9 % IV BOLUS
500.0000 mL | Freq: Once | INTRAVENOUS | Status: AC
  Administered 2018-10-01: 17:00:00 500 mL via INTRAVENOUS

## 2018-10-01 MED ORDER — ACETAMINOPHEN 160 MG/5ML PO SOLN
650.0000 mg | ORAL | Status: DC | PRN
  Filled 2018-10-01: qty 20.3

## 2018-10-01 MED ORDER — FUROSEMIDE 40 MG PO TABS
40.0000 mg | ORAL_TABLET | Freq: Two times a day (BID) | ORAL | Status: DC
  Administered 2018-10-02 – 2018-10-05 (×8): 40 mg via ORAL
  Filled 2018-10-01 (×8): qty 1

## 2018-10-01 MED ORDER — IPRATROPIUM-ALBUTEROL 0.5-2.5 (3) MG/3ML IN SOLN
3.0000 mL | Freq: Once | RESPIRATORY_TRACT | Status: AC
  Administered 2018-10-01: 3 mL via RESPIRATORY_TRACT
  Filled 2018-10-01: qty 3

## 2018-10-01 MED ORDER — ACETAMINOPHEN 325 MG PO TABS: 650.0000 mg | ORAL_TABLET | ORAL | Status: DC | PRN

## 2018-10-01 MED ORDER — ATORVASTATIN CALCIUM 20 MG PO TABS
40.0000 mg | ORAL_TABLET | Freq: Every day | ORAL | Status: DC
  Administered 2018-10-02 – 2018-10-08 (×7): 40 mg via ORAL
  Filled 2018-10-01 (×6): qty 2

## 2018-10-01 MED ORDER — IPRATROPIUM-ALBUTEROL 0.5-2.5 (3) MG/3ML IN SOLN: 3.0000 mL | Freq: Four times a day (QID) | RESPIRATORY_TRACT | Status: DC | PRN

## 2018-10-01 MED ORDER — MORPHINE SULFATE (PF) 2 MG/ML IV SOLN: 2.0000 mg | INTRAVENOUS | Status: DC | PRN

## 2018-10-01 MED ORDER — SENNOSIDES-DOCUSATE SODIUM 8.6-50 MG PO TABS: 1.0000 | ORAL_TABLET | Freq: Every evening | ORAL | Status: DC | PRN

## 2018-10-01 MED ORDER — FUROSEMIDE 20 MG PO TABS: ORAL_TABLET | ORAL | 6 refills | Status: AC

## 2018-10-01 MED ORDER — METOPROLOL SUCCINATE ER 50 MG PO TB24
50.0000 mg | ORAL_TABLET | Freq: Every day | ORAL | Status: DC
  Filled 2018-10-01: qty 1

## 2018-10-01 MED ORDER — ASPIRIN 81 MG PO CHEW
324.0000 mg | CHEWABLE_TABLET | Freq: Once | ORAL | Status: AC
  Administered 2018-10-01: 324 mg via ORAL
  Filled 2018-10-01: qty 4

## 2018-10-01 MED ORDER — TRAMADOL HCL 50 MG PO TABS
50.0000 mg | ORAL_TABLET | Freq: Four times a day (QID) | ORAL | Status: DC | PRN
  Administered 2018-10-02 – 2018-10-05 (×3): 50 mg via ORAL
  Filled 2018-10-01 (×3): qty 1

## 2018-10-01 MED ORDER — STROKE: EARLY STAGES OF RECOVERY BOOK
Freq: Once | Status: AC
  Administered 2018-10-02

## 2018-10-01 MED ORDER — RIVAROXABAN 15 MG PO TABS
15.0000 mg | ORAL_TABLET | Freq: Every day | ORAL | Status: DC
  Administered 2018-10-02 – 2018-10-08 (×7): 15 mg via ORAL
  Filled 2018-10-01 (×7): qty 1

## 2018-10-01 MED ORDER — ACETAMINOPHEN 650 MG RE SUPP: 650.0000 mg | RECTAL | Status: DC | PRN

## 2018-10-01 NOTE — ED Notes (Signed)
Unable to obtain blood work at this time. MD notified and IV team consult orders placed to obtain better IV access and blood work

## 2018-10-01 NOTE — ED Provider Notes (Signed)
[facility] Emergency Department Provider Note _________________________________________  First MD Initiated Contact with Patient 10/01/18 1546 (approximate)  I have reviewed the triage vital signs and the nursing notes.  HISTORY  Chief Complaint Wound Infection  EM caveat: Patient somnolent, somewhat confused  HPI Julia Gordon is a 84 y.o. female history of DVTs, cardiomyopathy, wounds on the legs  Patient went to wound clinic today, her family reports that she is normal up conversant but throughout the entirety of the day today she is just seemed very tired.  Had trouble getting her even into the clinic.  The doctor there per EMS called 911 believing that patient could potentially be septic or needed ER evaluation  Patient herself denies being any pain.  She is feels very tired.  Past Medical History:  Diagnosis Date  . Anemia   . Breast cancer ([place]) 2015   a. L mastectomy with chemoradiation  . Colon adenocarcinoma ([place])   . COPD (chronic obstructive pulmonary disease) ([place])   . Depression   . DJD (degenerative joint disease) of knee    right knee  . DVT (deep venous thrombosis) ([place])    a. 06/2018 L popliteal and R peroneal DVTs-->Xarelto.  . H/O hysterectomy with oophorectomy    for DUB  . HFrEF (heart failure with reduced ejection fraction) ([place])    a. 06/2018 Echo: EF 25-30%.  . History of depression   . HLD (hyperlipidemia)   . HTN (hypertension)   . LV (left ventricular) mural thrombus    history of, resolved (echo 04/09)  . NICM (nonischemic cardiomyopathy) ([place])    a. Reports prior h/o cath @ [facility]; b. status post [place] with Guidant lead in 2009 at [facility], Dr. Boyd Kerbs; c. 2014 - prev seen by G. Lovena Le, MD; c. 06/2018 Echo: EF 25-30%, DD. Diff HK. RVSP 79mmHg. Mildly dil RA. Mild to mod MR. Mod TR. Mild AI.  Marland Kitchen Persistent atrial fibrillation    a. noted in Care Everywhere from 2008-2009; b. CHADS2VASc 6 (CHF, HTN, age x 2,  vascular disease, female)-->eliquis added 06/2018 in setting of LLE DVT.  . Valvular heart disease    a. 06/2018 Echo: Mod TR, mild to mod MR, mild AI.    Patient Active Problem List   Diagnosis Date Noted  . Persistent atrial fibrillation 09/23/2018  . Skin ulcer, limited to breakdown of skin ([place]) 09/23/2018  . Left arm swelling 09/23/2018  . Acute deep vein thrombosis (DVT) of left lower extremity ([place]) 06/27/2018  . Age-related osteoporosis with current pathological fracture with routine healing 07/24/2017  . Breast cancer ([place]) 07/24/2017  . Femur fracture ([place]) 07/20/2017  . HTN (hypertension) 07/20/2017  . PAF (paroxysmal atrial fibrillation) ([place]) 07/20/2017  . Primary cancer of upper inner quadrant of left female breast ([place]) 07/17/2014  . Acute on chronic HFrEF (heart failure with reduced ejection fraction) ([place]) 03/24/2011  . ICD (implantable cardiac defibrillator) in place 03/24/2011  . Dyslipidemia 03/24/2011  . Other primary cardiomyopathies     Past Surgical History:  Procedure Laterality Date  . ABDOMINAL HYSTERECTOMY    . BREAST BIOPSY Left 05/10/13   [place] bx-positive  . COLONOSCOPY WITH PROPOFOL N/A 01/15/2017   Procedure: COLONOSCOPY WITH PROPOFOL;  Surgeon: Jonathon Bellows, MD;  Location: [facility] ENDOSCOPY;  Service: Gastroenterology;  Laterality: N/A;  . LAPAROSCOPIC RIGHT COLON RESECTION  08/10   Dr Pat Patrick, for adenoca   . MASTECTOMY Left 2015   BREAST CA  . OOPHORECTOMY    .  ORIF FEMUR FRACTURE Right 07/21/2017   Procedure: OPEN REDUCTION INTERNAL FIXATION (ORIF) DISTAL FEMUR FRACTURE;  Surgeon: Lovell Sheehan, MD;  Location: [facility] ORS;  Service: Orthopedics;  Laterality: Right;  femur   . PACEMAKER INSERTION  2009  . REPLACEMENT TOTAL KNEE  1990's   right     Prior to Admission medications   Medication Sig Start Date End Date Taking? Authorizing Provider  alendronate (FOSAMAX) 70 MG tablet TAKE 1 TAB WEEKLY 30 MINS BEFORE BREAKFAST WITH A FULL GLASS OF WATER. DO NOT  LIE DOWN FOR 30 MINS 10/25/17   Lloyd Huger, MD  furosemide (LASIX) 20 MG tablet Take 40 mg (2 tablets) by mouth every morning, take 1 tablet (20 mg total) once in the afternoon. 10/01/18   End, Harrell Gave, MD  lovastatin (MEVACOR) 40 MG tablet Take 2 tablets by mouth Daily. 01/31/11   [provider]  metoprolol succinate (TOPROL-XL) 50 MG 24 hr tablet Take 1 tablet (50 mg total) by mouth daily. 09/23/18   End, Harrell Gave, MD  potassium chloride SA (K-DUR) 20 MEQ tablet Take 1 tablet (20 mEq total) by mouth daily. 09/29/18   End, Harrell Gave, MD  rivaroxaban (XARELTO) 15 MG TABS tablet Take 1 tablet (15 mg total) by mouth daily with supper. 09/23/18   End, Harrell Gave, MD  traMADol (ULTRAM) 50 MG tablet Take 1 tablet (50 mg total) by mouth every 6 (six) hours as needed. 09/25/18   Cuthriell, Charline Bills, PA-C    Allergies No known allergies  Family History  Problem Relation Age of Onset  . Hypertension Father   . Alzheimer's disease Mother   . Breast cancer Sister 26  . Diabetes Other        brothers  . Stroke Brother   . Heart failure Brother   . Lupus Sister     Social History Social History   Tobacco Use  . Smoking status: Former Research scientist (life sciences)  . Smokeless tobacco: Never Used  . Tobacco comment: quit 2006  Substance Use Topics  . Alcohol use: No    Comment: former  . Drug use: No    Review of Systems EM caveat   ____________________________________________   PHYSICAL EXAM:  VITAL SIGNS: ED Triage Vitals  Enc Vitals Group     BP 10/01/18 1539 119/77     Pulse Rate 10/01/18 1539 75     Resp --      Temp 10/01/18 1539 (!) 97.5 F (36.4 C)     Temp Source 10/01/18 1539 Oral     SpO2 10/01/18 1534 94 %     Weight 10/01/18 1540 174 lb 8 oz (79.2 kg)     Height 10/01/18 1540 5\' 5"  (1.651 m)     Head Circumference --      Peak Flow --      Pain Score 10/01/18 1545 0     Pain Loc --      Pain Edu? --      Excl. in [place]? --     Constitutional: Somnolent.   Arouses to voice.  Recognizes family, able to give her full name.  No evidence of trauma. Eyes: Conjunctivae are normal. Head: Atraumatic. Nose: No congestion/rhinnorhea. Mouth/Throat: Mucous membranes are moist. Neck: No stridor.  Cardiovascular: Normal rate, regular rhythm. Grossly normal heart sounds.  Good peripheral circulation. Respiratory: Normal respiratory effort.  No retractions. Lungs CTAB. Gastrointestinal: Soft and nontender. No distention. Musculoskeletal: Moderate edema both bilateral lower extremities.  Left greater than right.  Left has an  open wound over the anterior as well as posterior region of the lower leg especially on the posterior is just a proximal to the region of the ankle.  Some slight purulence noted at the edges of the posterior wound.  Dopplerable posterior tibial pulses bilateral. Neurologic: Calm, soft-spoken.  No gross focal neurologic deficits are appreciated.  Skin:  Skin is warm, dry and intact. No rash noted. Psychiatric: Mood and affect are calm, flat.  ____________________________________________   LABS (all labs ordered are listed, but only abnormal results are displayed)  Labs Reviewed  COMPREHENSIVE METABOLIC PANEL - Abnormal; Notable for the following components:      Result Value   BUN 35 (*)    Creatinine, Ser 1.29 (*)    Calcium 8.7 (*)    Total Protein 5.9 (*)    Albumin 2.9 (*)    GFR calc non Af Amer 39 (*)    GFR calc Af Amer 45 (*)    All other components within normal limits  CBC WITH DIFFERENTIAL/PLATELET - Abnormal; Notable for the following components:   RDW 15.8 (*)    All other components within normal limits  URINALYSIS, COMPLETE (UACMP) WITH MICROSCOPIC - Abnormal; Notable for the following components:   Color, Urine YELLOW (*)    APPearance HAZY (*)    Protein, ur 30 (*)    All other components within normal limits  SARS CORONAVIRUS 2 (HOSPITAL ORDER, [facility])  CULTURE, BLOOD (ROUTINE  X 2)  CULTURE, BLOOD (ROUTINE X 2)  LACTIC ACID, PLASMA  PROCALCITONIN  GLUCOSE, CAPILLARY  LACTIC ACID, PLASMA   ____________________________________________  EKG  Reviewed and interpreted by me at 1540 Heart rate 80 QRS 100 QTc 450 Atrial fibrillation, possible old anterior infarct.  Low voltage across multiple leads.  No evidence of acute ischemia ____________________________________________  RADIOLOGY  Ct Head Wo Contrast  Result Date: 10/01/2018 CLINICAL DATA:  Altered mental status.  Patient on Xarelto. EXAM: CT HEAD WITHOUT CONTRAST TECHNIQUE: Contiguous axial images were obtained from the base of the skull through the vertex without intravenous contrast. COMPARISON:  October 04, 2006 FINDINGS: Brain: No subdural, epidural, or subarachnoid hemorrhage identified. Ventricles and sulci are prominent but stable. Cerebellum, brainstem, and basal cisterns are within normal limits. No mass effect or midline shift. White matter changes are identified, particularly in the left frontal lobe such as on axial image 18. There appears to be mild involvement over the overlying cortex suggesting a small left frontal infarct. No other evidence of acute ischemia or infarct identified. Vascular: Calcified atherosclerosis is seen in the intracranial carotids. Skull: Normal. Negative for fracture or focal lesion. Sinuses/Orbits: No acute finding. Other: None. IMPRESSION: 1. Focal white matter changes are seen in the left frontal lobe with a small region of overlying cortical involvement consistent with infarct. These findings are age indeterminate but favored to be nonacute based on appearance. Recommend clinical correlation 2. No other acute abnormalities are identified. Electronically Signed   By: Dorise Bullion III M.D   On: 10/01/2018 16:42   Dg Chest Port 1 View  Result Date: 10/01/2018 CLINICAL DATA:  Sepsis. EXAM: PORTABLE CHEST 1 VIEW COMPARISON:  09/25/2018 FINDINGS: The heart is enlarged but  stable. There is moderate tortuosity, ectasia and calcification of the thoracic aorta. The pacer wires/AICD is stable. The right IJ power port is stable. The lungs are clear of an acute process. Minimal right basilar scarring or atelectasis. The bony thorax is intact. IMPRESSION: 1. Stable cardiac enlargement  and tortuous ectatic calcified thoracic aorta. 2. Streaky right basilar scarring or atelectasis but no infiltrates or effusions. Electronically Signed   By: Marijo Sanes M.D.   On: 10/01/2018 16:36   CT head reviewed, white matter changes in the left frontal lobe concerning for possible infarct.  Age-indeterminate reviewed by me ____________________________________________   PROCEDURES  Procedure(s) performed: None  Procedures  Critical Care performed: No  ____________________________________________   INITIAL IMPRESSION / ASSESSMENT AND PLAN / ED COURSE  Pertinent labs & imaging results that were available during my care of the patient were reviewed by me and considered in my medical decision making (see chart for details).   Somnolence.  Weakness.  Progressing.  Evidence of possible left lower extremity cellulitic changes or wound infection.  Plan to obtain screening labs including sepsis panel, she is somewhat somnolent with slight altered mental status obtain a head CT to assure there is no evidence of intracranial hemorrhage as she is on anticoagulant but no fall or trauma is reported.  Denies chest pain.  Slightly low oxygen about 80% on room air, placed on 2 L with resultant improvement.  Improvement check chest x-ray, evaluate for source and causes of altered mental status, somnolence.  Infection remains high in her differential  Clinical Course as of Sep 30 1898  Fri Oct 01, 2018  1705 Patient able to have 2 peripheral IVs inserted but no blood draw back.  I attempted to find a suitable vein by ultrasound was unable to locate.  Nurse was able to start an external jugular with  my oversight on the right, successful draw.   [MQ]    Clinical Course User Index [MQ] Delman Kitten, MD   ----------------------------------------- 7:01 PM on 10/01/2018 -----------------------------------------  Patient fully awake and alert now.  Requesting to eat and drink.  Sitting up.  She appears much improved.  Daughter at the bedside also feels patient looks much better.  I given her presentation with significant somnolence, altered mental status and unclear etiology of abnormal head CT finding if she could have had some type of indeterminate stroke will admit her for further work-up and care.  She passed her swallow screen.  She is currently awake alert and significantly improved.  Discussed case with hospitalist Dr. Barnetta Chapel Patient and family comfortable with admission  ____________________________________________   FINAL CLINICAL IMPRESSION(S) / ED DIAGNOSES  Final diagnoses:  Somnolence  Wound of left lower extremity, initial encounter        Note:  This document was prepared using Dragon voice recognition software and may include unintentional dictation errors       Delman Kitten, MD 10/01/18 1901

## 2018-10-01 NOTE — ED Notes (Signed)
Dr. Quale at bedside.  

## 2018-10-01 NOTE — ED Notes (Signed)
Patient transported to CT 

## 2018-10-01 NOTE — ED Triage Notes (Signed)
Pt arrival to ED via EMS from wound care center. Pt was at wound care center due to abscess treatment. EMS states that when the doctor took wraps off that the pt became short of breath and that it looked infected.   Dr. wrapped legs back up and sent her to ED.

## 2018-10-01 NOTE — ED Notes (Signed)
Pt cleared by ED MD to eat. Pt given sandwich tray and ice water

## 2018-10-01 NOTE — Progress Notes (Signed)
Julia Gordon, Julia Gordon ([id number]) Visit Report for 10/01/2018 Abuse/Suicide Risk Screen Details Patient Name: Julia Gordon, Julia Gordon Date of Service: 10/01/2018 2:15 PM Medical Record Number: [id number] Patient Account Number: [id number] Date of Birth/Sex: 1934-12-20 (84 y.o. F) Treating RN: Harold Barban Primary Care Yasmin Bronaugh: Kingsley Spittle Other Clinician: Referring Innocence Schlotzhauer: END, CHRISTOPHER Treating Mckay Brandt/Extender: STONE III, HOYT Weeks in Treatment: 0 Abuse/Suicide Risk Screen Items Answer ABUSE RISK SCREEN: Has anyone close to you tried to hurt or harm you recentlyo No Do you feel uncomfortable with anyone in your familyo No Has anyone forced you do things that you didnot want to doo No Electronic Signature(s) Signed: 10/01/2018 4:30:52 PM By: Harold Barban Entered By: Harold Barban on 10/01/2018 14:48:08 Julia Gordon ([id number]) -------------------------------------------------------------------------------- Activities of Daily Living Details Patient Name: Julia Gordon Date of Service: 10/01/2018 2:15 PM Medical Record Number: [id number] Patient Account Number: [id number] Date of Birth/Sex: [birth year] (84 y.o. F) Treating RN: Harold Barban Primary Care Chaslyn Eisen: Kingsley Spittle Other Clinician: Referring Reshad Saab: END, CHRISTOPHER Treating Chaka Boyson/Extender: STONE III, HOYT Weeks in Treatment: 0 Activities of Daily Living Items Answer Activities of Daily Living (Please select one for each item) Drive Automobile Not Able Take Medications Need Assistance Use Telephone Need Assistance Care for Appearance Need Assistance Use Toilet Need Assistance Bath / Shower Not Able Dress Self Not Able Feed Self Completely Able Walk Not Able Get In / Out Bed Not Able Housework Not Able Prepare Meals Not Able Handle Money Not Able Shop for Self Not Able Electronic Signature(s) Signed: 10/01/2018 4:30:52 PM By: Harold Barban Entered By: Harold Barban on 10/01/2018  14:49:08 Julia Gordon ([id number]) -------------------------------------------------------------------------------- Education Screening Details Patient Name: Julia Gordon Date of Service: 10/01/2018 2:15 PM Medical Record Number: [id number] Patient Account Number: [id number] Date of Birth/Sex: [birth year] (84 y.o. F) Treating RN: Harold Barban Primary Care Nicolo Tomko: Kingsley Spittle Other Clinician: Referring Breck Hollinger: END, CHRISTOPHER Treating Lakeyn Dokken/Extender: Melburn Hake, HOYT Weeks in Treatment: 0 Primary Learner Assessed: Patient Learning Preferences/Education Level/Primary Language Learning Preference: Explanation Highest Education Level: High School Preferred Language: English Cognitive Barrier Language Barrier: No Translator Needed: No Memory Deficit: No Emotional Barrier: No Cultural/Religious Beliefs Affecting Medical Care: No Physical Barrier Impaired Vision: No Impaired Hearing: No Decreased Hand dexterity: No Knowledge/Comprehension Knowledge Level: Medium Comprehension Level: Medium Ability to understand written High instructions: Ability to understand verbal High instructions: Motivation Anxiety Level: Calm Cooperation: Cooperative Education Importance: Acknowledges Need Interest in Health Problems: Asks Questions Perception: Coherent Willingness to Engage in Self- High Management Activities: Readiness to Engage in Self- High Management Activities: Electronic Signature(s) Signed: 10/01/2018 4:30:52 PM By: Harold Barban Entered By: Harold Barban on 10/01/2018 14:49:40 Julia Gordon ([id number]) -------------------------------------------------------------------------------- Fall Risk Assessment Details Patient Name: Julia Gordon Date of Service: 10/01/2018 2:15 PM Medical Record Number: [id number] Patient Account Number: [id number] Date of Birth/Sex: 05-Aug-1934 (84 y.o. F) Treating RN: Harold Barban Primary Care Hong Moring: Kingsley Spittle  Other Clinician: Referring Haide Klinker: END, CHRISTOPHER Treating Jalayla Chrismer/Extender: STONE III, HOYT Weeks in Treatment: 0 Fall Risk Assessment Items Have you had 2 or more falls in the last 12 monthso 0 Yes Have you had any fall that resulted in injury in the last 12 monthso 0 Yes FALLS RISK SCREEN History of falling - immediate or within 3 months 0 No Secondary diagnosis (Do you have 2 or more medical diagnoseso) 0 No Ambulatory aid None/bed rest/wheelchair/nurse 0 No Crutches/cane/walker 0 No Furniture 0 No Intravenous therapy Access/Saline/Heparin Lock 0 No Gait/Transferring Normal/ bed rest/ wheelchair 0 No Weak (short  steps with or without shuffle, stooped but able to lift head while 0 No walking, may seek support from furniture) Impaired (short steps with shuffle, may have difficulty arising from chair, head 20 Yes down, impaired balance) Mental Status Oriented to own ability 0 No Electronic Signature(s) Signed: 10/01/2018 4:30:52 PM By: Harold Barban Entered By: Harold Barban on 10/01/2018 14:51:08 Julia Gordon ([id number]) -------------------------------------------------------------------------------- Nutrition Risk Screening Details Patient Name: Julia Gordon Date of Service: 10/01/2018 2:15 PM Medical Record Number: [id number] Patient Account Number: [id number] Date of Birth/Sex: October 27, 1934 (84 y.o. F) Treating RN: Harold Barban Primary Care Shunda Rabadi: Kingsley Spittle Other Clinician: Referring Nathalya Wolanski: END, CHRISTOPHER Treating Amerika Nourse/Extender: STONE III, HOYT Weeks in Treatment: 0 Height (in): 65 Weight (lbs): 173 Body Mass Index (BMI): 28.8 Nutrition Risk Screening Items Score Screening NUTRITION RISK SCREEN: I have an illness or condition that made me change the kind and/or amount of 0 No food I eat I eat fewer than two meals per day 3 Yes I eat few fruits and vegetables, or milk products 0 No I have three or more drinks of beer, liquor or wine  almost every day 0 No I have tooth or mouth problems that make it hard for me to eat 0 No I don't always have enough money to buy the food I need 0 No I eat alone most of the time 0 No I take three or more different prescribed or over-the-counter drugs a day 1 Yes Without wanting to, I have lost or gained 10 pounds in the last six months 0 No I am not always physically able to shop, cook and/or feed myself 0 No Nutrition Protocols Good Risk Protocol Moderate Risk Protocol High Risk Proctocol Risk Level: Moderate Risk Score: 4 Electronic Signature(s) Signed: 10/01/2018 4:30:52 PM By: Harold Barban Entered By: Harold Barban on 10/01/2018 14:51:31

## 2018-10-01 NOTE — Progress Notes (Addendum)
Julia Gordon, Julia Gordon ([id number]) Visit Report for 10/01/2018 Allergy List Details Patient Name: Julia Gordon, Julia Gordon Date of Service: 10/01/2018 2:15 PM Medical Record Number: [id number] Patient Account Number: [id number] Date of Birth/Sex: [birth year] (84 y.o. F) Treating RN: Harold Barban Primary Care Jusiah Aguayo: Kingsley Spittle Other Clinician: Referring Santo Zahradnik: END, CHRISTOPHER Treating Anjali Manzella/Extender: STONE III, HOYT Weeks in Treatment: 0 Allergies Active Allergies No Known Drug Allergies Allergy Notes Electronic Signature(s) Signed: 10/01/2018 4:30:52 PM By: Harold Barban Entered By: Harold Barban on 10/01/2018 14:28:20 Julia Gordon ([id number]) -------------------------------------------------------------------------------- Arrival Information Details Patient Name: Julia Gordon Date of Service: 10/01/2018 2:15 PM Medical Record Number: [id number] Patient Account Number: [id number] Date of Birth/Sex: May 18, 1934 (84 y.o. F) Treating RN: Harold Barban Primary Care Dominick Zertuche: Kingsley Spittle Other Clinician: Referring Winnie Barsky: END, CHRISTOPHER Treating Melecio Cueto/Extender: Melburn Hake, HOYT Weeks in Treatment: 0 Visit Information Patient Arrived: Wheel Chair Arrival Time: 14:18 Accompanied By: daughter Transfer Assistance: None Patient Identification Verified: Yes Secondary Verification Process Completed: Yes Electronic Signature(s) Signed: 10/01/2018 4:30:52 PM By: Harold Barban Entered By: Harold Barban on 10/01/2018 14:23:59 Julia Gordon ([id number]) -------------------------------------------------------------------------------- General Visit Notes Details Patient Name: Julia Gordon Date of Service: 10/01/2018 2:15 PM Medical Record Number: [id number] Patient Account Number: [id number] Date of Birth/Sex: [birth year] (84 y.o. F) Treating RN: Harold Barban Primary Care Sequoia Mincey: Kingsley Spittle Other Clinician: Referring Julieanne Hadsall: END, CHRISTOPHER Treating  Koal Eslinger/Extender: STONE III, HOYT Weeks in Treatment: 0 Notes During "History intake" we observed patient having episodes of SOB and falling asleep. Further assessment showed decrease in both O2 stats and blood pressure. Put patient on 2L called EMS and monitored patient while Jeri Cos did an assessment. Gave EMS patients initial and current vitals along with Medication list. Phillips Odor Electronic Signature(s) Signed: 10/01/2018 4:30:34 PM By: Harold Barban Entered By: Harold Barban on 10/01/2018 16:30:34 Julia Gordon ([id number]) -------------------------------------------------------------------------------- Pain Assessment Details Patient Name: Julia Gordon Date of Service: 10/01/2018 2:15 PM Medical Record Number: [id number] Patient Account Number: [id number] Date of Birth/Sex: 1934-09-06 (84 y.o. F) Treating RN: Harold Barban Primary Care Jessicaann Overbaugh: Kingsley Spittle Other Clinician: Referring Liv Rallis: END, CHRISTOPHER Treating Frimy Uffelman/Extender: STONE III, HOYT Weeks in Treatment: 0 Active Problems Location of Pain Severity and Description of Pain Patient Has Paino No Site Locations Pain Management and Medication Current Pain Management: Electronic Signature(s) Signed: 10/01/2018 4:30:52 PM By: Harold Barban Entered By: Harold Barban on 10/01/2018 14:24:10 Julia Gordon ([id number]) -------------------------------------------------------------------------------- Vitals Details Patient Name: Julia Gordon Date of Service: 10/01/2018 2:15 PM Medical Record Number: [id number] Patient Account Number: [id number] Date of Birth/Sex: July 21, 1934 (84 y.o. F) Treating RN: Harold Barban Primary Care Mervil Wacker: Kingsley Spittle Other Clinician: Referring Tywon Niday: END, CHRISTOPHER Treating Manasseh Pittsley/Extender: STONE III, HOYT Weeks in Treatment: 0 Vital Signs Time Taken: 14:20 Temperature (F): 98.1 Height (in): 65 Pulse (bpm): 63 Source: Stated Respiratory Rate  (breaths/min): 16 Weight (lbs): 173 Blood Pressure (mmHg): 104/74 Source: Stated Reference Range: 80 - 120 mg / dl Body Mass Index (BMI): 28.8 Electronic Signature(s) Signed: 10/01/2018 4:30:52 PM By: Harold Barban Entered By: Harold Barban on 10/01/2018 14:27:14

## 2018-10-01 NOTE — Progress Notes (Signed)
Family Meeting Note  Advance Directive:yes  Today a meeting took place with the Patient and Daughter.  Patient is able to participate.  The following clinical team members were present during this meeting:MD  The following were discussed:Patient's diagnosis: Stroke, Patient's progosis: Unable to determine and Goals for treatment: Full Code  Additional follow-up to be provided: prn  Time spent during discussion:20 minutes  Evette Doffing, MD

## 2018-10-01 NOTE — Progress Notes (Signed)
Julia Gordon ([id number]) Visit Report for 10/01/2018 Chief Complaint Document Details Patient Name: Julia Gordon Date of Service: 10/01/2018 2:15 PM Medical Record Number: [id number] Patient Account Number: [id number] Date of Birth/Sex: 1934-11-11 (84 y.o. F) Treating RN: Montey Hora Primary Care Provider: Kingsley Spittle Other Clinician: Referring Provider: END, CHRISTOPHER Treating Provider/Extender: Melburn Hake, Imogine Carvell Weeks in Treatment: 0 Information Obtained from: Patient Chief Complaint Bilateral LE Ulcers Electronic Signature(s) Signed: 10/01/2018 4:42:36 PM By: Worthy Keeler PA-C Entered By: Worthy Keeler on 10/01/2018 16:42:36 Julia Gordon ([id number]) -------------------------------------------------------------------------------- HPI Details Patient Name: Julia Gordon Date of Service: 10/01/2018 2:15 PM Medical Record Number: [id number] Patient Account Number: [id number] Date of Birth/Sex: [birth year] (84 y.o. F) Treating RN: Montey Hora Primary Care Provider: Kingsley Spittle Other Clinician: Referring Provider: END, CHRISTOPHER Treating Provider/Extender: Melburn Hake, Cathan Gearin Weeks in Treatment: 0 History of Present Illness HPI Description: 10/01/2018 on evaluation today patient appears to be doing poorly upon initial inspection here in the clinic. She unfortunately appears to have cellulitis of the bilateral lower extremities although the left definitely appears to be worse than the right. She also upon arrival was having difficulty breathing with vitals that were somewhat questionable we were not able to get a good pulse ox on her due to cold extremities and to be honest she may actually have a very low saturation. She was short of breath and subsequently we did place her on 2 L of oxygen she came in on room air she is normally not on oxygen. Nonetheless I explained to the patient and her daughter who was present during the evaluation that I think this is something  that we need to have her on until we can call EMS I believe that there is a good chance she may be septic based on the way her wound looks on the left lower extremity. Her daughter who is present with her states this is not the way she normally acts at all. Shortly after putting the nasal cannula in her nose and explaining what this was I then subsequently was asked by the patient to get her a paper towel. When I gave that to her she started to try to wipe her nose and then acted surprised to find the nasal cannula was there I feel like that she actually does have some confusion as well again this would be consistent with sepsis. For that reason we did end up calling EMS for transport to the ER for further evaluation. Electronic Signature(s) Signed: 10/01/2018 4:43:19 PM By: Worthy Keeler PA-C Entered By: Worthy Keeler on 10/01/2018 16:43:18 Julia Gordon ([id number]) -------------------------------------------------------------------------------- Physical Exam Details Patient Name: Julia Gordon Date of Service: 10/01/2018 2:15 PM Medical Record Number: [id number] Patient Account Number: [id number] Date of Birth/Sex: 11/16/1934 (84 y.o. F) Treating RN: Montey Hora Primary Care Provider: Kingsley Spittle Other Clinician: Referring Provider: END, CHRISTOPHER Treating Provider/Extender: STONE III, Jaiah Weigel Weeks in Treatment: 0 Constitutional patient is hypotensive.. pulse regular and within target range for patient.Marland Kitchen respiratory effort pronounced and rate above normal.. temperature within target range for patient.. Obese and well-hydrated in no acute distress. Eyes conjunctiva clear no eyelid edema noted. pupils equal round and reactive to light and accommodation. Ears, Nose, Mouth, and Throat no gross abnormality of ear auricles or external auditory canals. normal hearing noted during conversation. mucus membranes moist. Respiratory labored, rapid respiration. clear to auscultation  bilaterally. Cardiovascular regular rate and rhythm with normal S1, S2. 1+ dorsalis pedis/posterior tibialis pulses. 1+ pitting  edema of the bilateral lower extremities. Gastrointestinal (GI) soft, non-tender, non-distended, +BS. no ventral hernia noted. Musculoskeletal normal gait and posture. hammer toes. Psychiatric Patient is not able to cooperate in decision making regarding care. Patient is oriented to person only. pleasant and cooperative. Notes Patient's wounds currently appear to be infected in my opinion. With that being said we did not really do a full evaluation on the wounds themselves due to the fact she needed to be seen and sent to the ER ASAP due to her respiratory distress today. Again I believe all this may be related to sepsis and I discussed this with the patient's family today as well. Electronic Signature(s) Signed: 10/01/2018 4:48:54 PM By: Worthy Keeler PA-C Entered By: Worthy Keeler on 10/01/2018 16:48:54 Julia Gordon ([id number]) -------------------------------------------------------------------------------- Physician Orders Details Patient Name: Julia Gordon Date of Service: 10/01/2018 2:15 PM Medical Record Number: [id number] Patient Account Number: [id number] Date of Birth/Sex: 09-10-1934 (84 y.o. F) Treating RN: Montey Hora Primary Care Provider: Kingsley Spittle Other Clinician: Referring Provider: END, CHRISTOPHER Treating Provider/Extender: Melburn Hake, Chavez Rosol Weeks in Treatment: 0 Verbal / Phone Orders: No Diagnosis Coding ICD-10 Coding Code Description L03.115 Cellulitis of right lower limb L03.116 Cellulitis of left lower limb L97.812 Non-pressure chronic ulcer of other part of right lower leg with fat layer exposed L97.822 Non-pressure chronic ulcer of other part of left lower leg with fat layer exposed I48.0 Paroxysmal atrial fibrillation Notes Patient was sent to the emergency department for evaluation no wound care orders were  initiated at this point we will subsequently see where things stand when we see her back for follow-up once everything is settled. Electronic Signature(s) Signed: 10/01/2018 4:49:51 PM By: Worthy Keeler PA-C Entered By: Worthy Keeler on 10/01/2018 16:49:49 Julia Gordon, Julia Gordon ([id number]) -------------------------------------------------------------------------------- Problem List Details Patient Name: Julia Gordon Date of Service: 10/01/2018 2:15 PM Medical Record Number: [id number] Patient Account Number: [id number] Date of Birth/Sex: 12-Nov-1934 (84 y.o. F) Treating RN: Montey Hora Primary Care Provider: Kingsley Spittle Other Clinician: Referring Provider: END, CHRISTOPHER Treating Provider/Extender: Melburn Hake, Kiaria Quinnell Weeks in Treatment: 0 Active Problems ICD-10 Evaluated Encounter Code Description Active Date Today Diagnosis L03.115 Cellulitis of right lower limb 10/01/2018 No Yes L03.116 Cellulitis of left lower limb 10/01/2018 No Yes L97.812 Non-pressure chronic ulcer of other part of right lower leg 10/01/2018 No Yes with fat layer exposed L97.822 Non-pressure chronic ulcer of other part of left lower leg with 10/01/2018 No Yes fat layer exposed I48.0 Paroxysmal atrial fibrillation 10/01/2018 No Yes Inactive Problems Resolved Problems Electronic Signature(s) Signed: 10/01/2018 4:42:23 PM By: Worthy Keeler PA-C Entered By: Worthy Keeler on 10/01/2018 16:42:23 Julia Gordon ([id number]) -------------------------------------------------------------------------------- Progress Note Details Patient Name: Julia Gordon Date of Service: 10/01/2018 2:15 PM Medical Record Number: [id number] Patient Account Number: [id number] Date of Birth/Sex: Sep 03, 1934 (84 y.o. F) Treating RN: Montey Hora Primary Care Provider: Kingsley Spittle Other Clinician: Referring Provider: END, CHRISTOPHER Treating Provider/Extender: Melburn Hake, Traniece Boffa Weeks in Treatment: 0 Subjective Chief  Complaint Information obtained from Patient Bilateral LE Ulcers History of Present Illness (HPI) 10/01/2018 on evaluation today patient appears to be doing poorly upon initial inspection here in the clinic. She unfortunately appears to have cellulitis of the bilateral lower extremities although the left definitely appears to be worse than the right. She also upon arrival was having difficulty breathing with vitals that were somewhat questionable we were not able to get a good pulse ox on her due to cold extremities  and to be honest she may actually have a very low saturation. She was short of breath and subsequently we did place her on 2 L of oxygen she came in on room air she is normally not on oxygen. Nonetheless I explained to the patient and her daughter who was present during the evaluation that I think this is something that we need to have her on until we can call EMS I believe that there is a good chance she may be septic based on the way her wound looks on the left lower extremity. Her daughter who is present with her states this is not the way she normally acts at all. Shortly after putting the nasal cannula in her nose and explaining what this was I then subsequently was asked by the patient to get her a paper towel. When I gave that to her she started to try to wipe her nose and then acted surprised to find the nasal cannula was there I feel like that she actually does have some confusion as well again this would be consistent with sepsis. For that reason we did end up calling EMS for transport to the ER for further evaluation. Patient History Information obtained from Patient. Allergies No Known Drug Allergies Family History Cancer - Siblings, Diabetes - Siblings, Heart Disease - Siblings, Hypertension - Father,Siblings, No family history of Hereditary Spherocytosis, Kidney Disease, Lung Disease, Stroke, Thyroid Problems, Tuberculosis. Social History Former smoker, Alcohol Use -  Never, Drug Use - No History, Caffeine Use - Daily. Medical History Eyes Denies history of Cataracts, Glaucoma, Optic Neuritis Ear/Nose/Mouth/Throat Denies history of Chronic sinus problems/congestion, Middle ear problems Hematologic/Lymphatic Patient has history of Anemia Denies history of Hemophilia, Human Immunodeficiency Virus, Lymphedema, Sickle Cell Disease Respiratory Patient has history of Chronic Obstructive Pulmonary Disease (COPD) Denies history of Aspiration, Asthma, Pneumothorax, Sleep Apnea, Tuberculosis Cardiovascular Patient has history of Arrhythmia - A Fib, Congestive Heart Failure, Hypertension Wilden, Teya H. ([id number]) Denies history of Coronary Artery Disease, Hypotension, Myocardial Infarction Gastrointestinal Denies history of Cirrhosis , Colitis, Crohn s, Hepatitis A, Hepatitis B, Hepatitis C Endocrine Denies history of Type I Diabetes, Type II Diabetes Genitourinary Denies history of End Stage Renal Disease Immunological Denies history of Lupus Erythematosus, Raynaud s, Scleroderma Integumentary (Skin) Denies history of History of Burn, History of pressure wounds Neurologic Denies history of Dementia, Neuropathy, Quadriplegia, Paraplegia, Seizure Disorder Oncologic Patient has history of Received Chemotherapy Psychiatric Denies history of Anorexia/bulimia, Confinement Anxiety Medical And Surgical History Notes Musculoskeletal Arthritis Oncologic Breast Cancer Review of Systems (ROS) Constitutional Symptoms (General Health) Denies complaints or symptoms of Fatigue, Fever, Chills, Marked Weight Change. Eyes Complains or has symptoms of Glasses / Contacts. Denies complaints or symptoms of Dry Eyes, Vision Changes. Ear/Nose/Mouth/Throat Denies complaints or symptoms of Difficult clearing ears, Sinusitis. Hematologic/Lymphatic Complains or has symptoms of Bleeding / Clotting Disorders - Blood clot- R-leg. Denies complaints or symptoms of Human  Immunodeficiency Virus. Respiratory Complains or has symptoms of Shortness of Breath. Denies complaints or symptoms of Chronic or frequent coughs. Cardiovascular Complains or has symptoms of LE edema - Bilateral. Gastrointestinal Denies complaints or symptoms of Frequent diarrhea, Nausea, Vomiting. Endocrine Denies complaints or symptoms of Hepatitis, Thyroid disease, Polydypsia (Excessive Thirst). Genitourinary Denies complaints or symptoms of Kidney failure/ Dialysis, Incontinence/dribbling. Immunological Denies complaints or symptoms of Hives, Itching. Integumentary (Skin) Complains or has symptoms of Wounds - lower legs, Breakdown, Swelling. Denies complaints or symptoms of Bleeding or bruising tendency. Musculoskeletal Complains or has symptoms of Muscle Pain,  Muscle Weakness. Neurologic Complains or has symptoms of Focal/Weakness. Denies complaints or symptoms of Numbness/parasthesias. Psychiatric Denies complaints or symptoms of Anxiety, Claustrophobia, Depression Debellis, Julia H. ([id number]) Objective Constitutional patient is hypotensive.. pulse regular and within target range for patient.Marland Kitchen respiratory effort pronounced and rate above normal.. temperature within target range for patient.. Obese and well-hydrated in no acute distress. Vitals Time Taken: 2:20 PM, Height: 65 in, Source: Stated, Weight: 173 lbs, Source: Stated, BMI: 28.8, Temperature: 98.1 F, Pulse: 63 bpm, Respiratory Rate: 16 breaths/min, Blood Pressure: 104/74 mmHg. Eyes conjunctiva clear no eyelid edema noted. pupils equal round and reactive to light and accommodation. Ears, Nose, Mouth, and Throat no gross abnormality of ear auricles or external auditory canals. normal hearing noted during conversation. mucus membranes moist. Respiratory labored, rapid respiration. clear to auscultation bilaterally. Cardiovascular regular rate and rhythm with normal S1, S2. 1+ dorsalis pedis/posterior tibialis  pulses. 1+ pitting edema of the bilateral lower extremities. Gastrointestinal (GI) soft, non-tender, non-distended, +BS. no ventral hernia noted. Musculoskeletal normal gait and posture. hammer toes. Psychiatric Patient is not able to cooperate in decision making regarding care. Patient is oriented to person only. pleasant and cooperative. General Notes: Patient's wounds currently appear to be infected in my opinion. With that being said we did not really do a full evaluation on the wounds themselves due to the fact she needed to be seen and sent to the ER ASAP due to her respiratory distress today. Again I believe all this may be related to sepsis and I discussed this with the patient's family today as well. Assessment Active Problems ICD-10 Cellulitis of right lower limb Cellulitis of left lower limb Non-pressure chronic ulcer of other part of right lower leg with fat layer exposed Non-pressure chronic ulcer of other part of left lower leg with fat layer exposed Paroxysmal atrial fibrillation Julia Gordon, Julia Gordon. ([id number]) Plan General Notes: Patient was sent to the emergency department for evaluation no wound care orders were initiated at this point we will subsequently see where things stand when we see her back for follow-up once everything is settled. 1. At this time I am concerned about the possibility of sepsis in this patient and again she was sent to the ER for evaluation ASAP following her initial inspection here in the clinic when she was more or less stabilized and EMS was contacted. Subsequently the patient was transported to the ER for further evaluation and treatment. 2. With regard to her legs and the infection present I am going to recommend again that the ER initiate treatment and then from there we will subsequently make any adjustments as we need to enter treatment management whenever we see her back. Right now I think the biggest thing is trying to get figured out what  exactly is going on with regard to her shortness of breath at this point. We will see the patient back for follow-up visit once everything is evaluated and she is discharged from the hospital. I spent a significant amount of time in the room with the patient approximately 30 minutes coordinating care including talking with EMS as well as the 911 dispatcher and the fire department paramedics. Electronic Signature(s) Signed: 10/01/2018 4:51:44 PM By: Worthy Keeler PA-C Previous Signature: 10/01/2018 4:50:30 PM Version By: Worthy Keeler PA-C Entered By: Worthy Keeler on 10/01/2018 16:51:43 Julia Gordon ([id number]) -------------------------------------------------------------------------------- ROS/PFSH Details Patient Name: Julia Gordon Date of Service: 10/01/2018 2:15 PM Medical Record Number: [id number] Patient Account Number: [id number] Date of Birth/Sex:  05/19/1934 (84 y.o. F) Treating RN: Harold Barban Primary Care Provider: Kingsley Spittle Other Clinician: Referring Provider: END, CHRISTOPHER Treating Provider/Extender: STONE III, Teghan Philbin Weeks in Treatment: 0 Information Obtained From Patient Constitutional Symptoms (General Health) Complaints and Symptoms: Negative for: Fatigue; Fever; Chills; Marked Weight Change Eyes Complaints and Symptoms: Positive for: Glasses / Contacts Negative for: Dry Eyes; Vision Changes Medical History: Negative for: Cataracts; Glaucoma; Optic Neuritis Ear/Nose/Mouth/Throat Complaints and Symptoms: Negative for: Difficult clearing ears; Sinusitis Medical History: Negative for: Chronic sinus problems/congestion; Middle ear problems Hematologic/Lymphatic Complaints and Symptoms: Positive for: Bleeding / Clotting Disorders - Blood clot- R-leg Negative for: Human Immunodeficiency Virus Medical History: Positive for: Anemia Negative for: Hemophilia; Human Immunodeficiency Virus; Lymphedema; Sickle Cell Disease Respiratory Complaints and  Symptoms: Positive for: Shortness of Breath Negative for: Chronic or frequent coughs Medical History: Positive for: Chronic Obstructive Pulmonary Disease (COPD) Negative for: Aspiration; Asthma; Pneumothorax; Sleep Apnea; Tuberculosis Cardiovascular Complaints and Symptoms: Positive for: LE edema - Bilateral Medical HistoryBRECKIN, Julia Gordon ([id number]) Positive for: Arrhythmia - A Fib; Congestive Heart Failure; Hypertension Negative for: Coronary Artery Disease; Hypotension; Myocardial Infarction Gastrointestinal Complaints and Symptoms: Negative for: Frequent diarrhea; Nausea; Vomiting Medical History: Negative for: Cirrhosis ; Colitis; Crohnos; Hepatitis A; Hepatitis B; Hepatitis C Endocrine Complaints and Symptoms: Negative for: Hepatitis; Thyroid disease; Polydypsia (Excessive Thirst) Medical History: Negative for: Type I Diabetes; Type II Diabetes Genitourinary Complaints and Symptoms: Negative for: Kidney failure/ Dialysis; Incontinence/dribbling Medical History: Negative for: End Stage Renal Disease Immunological Complaints and Symptoms: Negative for: Hives; Itching Medical History: Negative for: Lupus Erythematosus; Raynaudos; Scleroderma Integumentary (Skin) Complaints and Symptoms: Positive for: Wounds - lower legs; Breakdown; Swelling Negative for: Bleeding or bruising tendency Medical History: Negative for: History of Burn; History of pressure wounds Musculoskeletal Complaints and Symptoms: Positive for: Muscle Pain; Muscle Weakness Medical History: Past Medical History Notes: Arthritis Neurologic Complaints and Symptoms: Positive for: Focal/Weakness Negative for: Numbness/parasthesias Medical HistoryDONNESHIA, Julia Gordon ([id number]) Negative for: Dementia; Neuropathy; Quadriplegia; Paraplegia; Seizure Disorder Psychiatric Complaints and Symptoms: Negative for: Anxiety; Claustrophobia Review of System Notes: Depression Medical History: Negative for:  Anorexia/bulimia; Confinement Anxiety Oncologic Medical History: Positive for: Received Chemotherapy Past Medical History Notes: Breast Cancer Immunizations Pneumococcal Vaccine: Received Pneumococcal Vaccination: No Implantable Devices Yes Family and Social History Cancer: Yes - Siblings; Diabetes: Yes - Siblings; Heart Disease: Yes - Siblings; Hereditary Spherocytosis: No; Hypertension: Yes - Father,Siblings; Kidney Disease: No; Lung Disease: No; Stroke: No; Thyroid Problems: No; Tuberculosis: No; Former smoker; Alcohol Use: Never; Drug Use: No History; Caffeine Use: Daily Electronic Signature(s) Signed: 10/01/2018 4:30:52 PM By: Harold Barban Signed: 10/01/2018 5:02:54 PM By: Worthy Keeler PA-C Entered By: Harold Barban on 10/01/2018 14:46:03 Julia Gordon ([id number]) -------------------------------------------------------------------------------- SuperBill Details Patient Name: Julia Gordon Date of Service: 10/01/2018 Medical Record Number: [id number] Patient Account Number: [id number] Date of Birth/Sex: 04-18-1934 (84 y.o. F) Treating RN: Montey Hora Primary Care Provider: Kingsley Spittle Other Clinician: Referring Provider: END, CHRISTOPHER Treating Provider/Extender: Melburn Hake, Meiko Ives Weeks in Treatment: 0 Diagnosis Coding ICD-10 Codes Code Description L03.115 Cellulitis of right lower limb L03.116 Cellulitis of left lower limb L97.812 Non-pressure chronic ulcer of other part of right lower leg with fat layer exposed L97.822 Non-pressure chronic ulcer of other part of left lower leg with fat layer exposed I48.0 Paroxysmal atrial fibrillation Physician Procedures CPT4 Code Description: [id number] - WC PHYS LEVEL 4 - NEW PT ICD-10 Diagnosis Description L03.115 Cellulitis of right lower limb L03.116 Cellulitis of left lower limb L97.812 Non-pressure chronic  ulcer of other part of right lower leg wi L97.822  Non-pressure chronic ulcer of other part of left lower  leg wit Modifier: th fat layer expo h fat layer expos Quantity: 1 sed ed Electronic Signature(s) Signed: 10/01/2018 4:51:06 PM By: Worthy Keeler PA-C Previous Signature: 10/01/2018 4:50:43 PM Version By: Worthy Keeler PA-C Entered By: Worthy Keeler on 10/01/2018 16:51:06

## 2018-10-01 NOTE — H&P (Addendum)
[facility] NAME: Julia Gordon    MR#:  [id number]  DATE OF BIRTH:  [birth year]  DATE OF ADMISSION:  10/01/2018  PRIMARY CARE PHYSICIAN: Ellamae Sia, MD   REQUESTING/REFERRING PHYSICIAN: Delman Kitten, MD  CHIEF COMPLAINT:   Chief Complaint  Patient presents with  . Wound Infection    HISTORY OF PRESENT ILLNESS:  Julia Gordon  is a 84 y.o. female with a known history of hypertension, chronic systolic CHF, COPD, depression, breast cancer, persistent a-fib, history of DVT on xarelto who was sent to the ED from wound clinic due to somnolence.  Per daughter at bedside, patient has been sleepy on and off throughout the day today.  She had a hard time waking her up to get her in the car for her wound clinic appointment.  While she was being evaluated by the doctor, she was continuously falling asleep, so she was sent to the ED for further evaluation.  Patient denies any chest pain, shortness of breath, abdominal pain.  She states she is "very cold".  She also endorses some bilateral lower extremity pain due to her leg wounds.  Patient states that her leg wounds have been there for a couple of weeks.  She has some open ulcers.  She denies any fevers or chills.  In the ED, she was in atrial fibrillation, but was rate-controlled. Labs were significant for creatinine 1.29. CXR showed a calcified thoracic aorta. CT head showed an age indeterminate infarct in the left frontal lobe. Hospitalists were called for admission.  PAST MEDICAL HISTORY:   Past Medical History:  Diagnosis Date  . Anemia   . Breast cancer ([place]) 2015   a. L mastectomy with chemoradiation  . Colon adenocarcinoma ([place])   . COPD (chronic obstructive pulmonary disease) ([place])   . Depression   . DJD (degenerative joint disease) of knee    right knee  . DVT (deep venous thrombosis) ([place])    a. 06/2018 L popliteal and R peroneal DVTs-->Xarelto.  . H/O hysterectomy with  oophorectomy    for DUB  . HFrEF (heart failure with reduced ejection fraction) ([place])    a. 06/2018 Echo: EF 25-30%.  . History of depression   . HLD (hyperlipidemia)   . HTN (hypertension)   . LV (left ventricular) mural thrombus    history of, resolved (echo 04/09)  . NICM (nonischemic cardiomyopathy) ([place])    a. Reports prior h/o cath @ [facility]; b. status post [place] with [employer] lead in 2009 at [facility], Dr. Boyd Kerbs; c. 2014 - prev seen by G. Lovena Le, MD; c. 06/2018 Echo: EF 25-30%, DD. Diff HK. RVSP 29mmHg. Mildly dil RA. Mild to mod MR. Mod TR. Mild AI.  Marland Kitchen Persistent atrial fibrillation    a. noted in Care Everywhere from 2008-2009; b. CHADS2VASc 6 (CHF, HTN, age x 2, vascular disease, female)-->eliquis added 06/2018 in setting of LLE DVT.  . Valvular heart disease    a. 06/2018 Echo: Mod TR, mild to mod MR, mild AI.    PAST SURGICAL HISTORY:   Past Surgical History:  Procedure Laterality Date  . ABDOMINAL HYSTERECTOMY    . BREAST BIOPSY Left 05/10/13   [place] bx-positive  . COLONOSCOPY WITH PROPOFOL N/A 01/15/2017   Procedure: COLONOSCOPY WITH PROPOFOL;  Surgeon: Jonathon Bellows, MD;  Location: [facility] ENDOSCOPY;  Service: Gastroenterology;  Laterality: N/A;  . LAPAROSCOPIC RIGHT COLON RESECTION  08/10   Dr Pat Patrick, for adenoca   .  MASTECTOMY Left 2015   BREAST CA  . OOPHORECTOMY    . ORIF FEMUR FRACTURE Right 07/21/2017   Procedure: OPEN REDUCTION INTERNAL FIXATION (ORIF) DISTAL FEMUR FRACTURE;  Surgeon: Lovell Sheehan, MD;  Location: [facility] ORS;  Service: Orthopedics;  Laterality: Right;  femur   . PACEMAKER INSERTION  2009  . REPLACEMENT TOTAL KNEE  1990's   right     SOCIAL HISTORY:   Social History   Tobacco Use  . Smoking status: Former Research scientist (life sciences)  . Smokeless tobacco: Never Used  . Tobacco comment: quit 2006  Substance Use Topics  . Alcohol use: No    Comment: former    FAMILY HISTORY:   Family History  Problem Relation Age of Onset  . Hypertension Father   . Alzheimer's  disease Mother   . Breast cancer Sister 43  . Diabetes Other        brothers  . Stroke Brother   . Heart failure Brother   . Lupus Sister     DRUG ALLERGIES:   Allergies  Allergen Reactions  . No Known Allergies     REVIEW OF SYSTEMS:   Review of Systems  Constitutional: Negative for chills and fever.  HENT: Negative for congestion and sore throat.   Eyes: Negative for blurred vision and double vision.  Respiratory: Negative for cough and shortness of breath.   Cardiovascular: Negative for chest pain and palpitations.  Gastrointestinal: Negative for nausea and vomiting.  Genitourinary: Negative for dysuria and urgency.  Musculoskeletal: Positive for joint pain. Negative for back pain and neck pain.  Neurological: Negative for dizziness and headaches.  Psychiatric/Behavioral: Negative for depression. The patient is not nervous/anxious.     MEDICATIONS AT HOME:   Prior to Admission medications   Medication Sig Start Date End Date Taking? Authorizing Provider  alendronate (FOSAMAX) 70 MG tablet TAKE 1 TAB WEEKLY 30 MINS BEFORE BREAKFAST WITH A FULL GLASS OF WATER. DO NOT LIE DOWN FOR 30 MINS 10/25/17   Lloyd Huger, MD  furosemide (LASIX) 20 MG tablet Take 40 mg (2 tablets) by mouth every morning, take 1 tablet (20 mg total) once in the afternoon. 10/01/18   End, Harrell Gave, MD  lovastatin (MEVACOR) 40 MG tablet Take 2 tablets by mouth Daily. 01/31/11   [provider]  metoprolol succinate (TOPROL-XL) 50 MG 24 hr tablet Take 1 tablet (50 mg total) by mouth daily. 09/23/18   End, Harrell Gave, MD  potassium chloride SA (K-DUR) 20 MEQ tablet Take 1 tablet (20 mEq total) by mouth daily. 09/29/18   End, Harrell Gave, MD  rivaroxaban (XARELTO) 15 MG TABS tablet Take 1 tablet (15 mg total) by mouth daily with supper. 09/23/18   End, Harrell Gave, MD  traMADol (ULTRAM) 50 MG tablet Take 1 tablet (50 mg total) by mouth every 6 (six) hours as needed. 09/25/18   Cuthriell,  Charline Bills, PA-C      VITAL SIGNS:  Blood pressure (!) 105/93, pulse 79, temperature (!) 97.5 F (36.4 C), temperature source Oral, resp. rate 16, height 5\' 5"  (1.651 m), weight 79.2 kg, SpO2 98 %.  PHYSICAL EXAMINATION:  Physical Exam  GENERAL:  84 y.o.-year-old patient lying in the bed with no acute distress.  EYES: Pupils equal, round, reactive to light and accommodation. No scleral icterus. Extraocular muscles intact.  HEENT: Head atraumatic, normocephalic. Oropharynx and nasopharynx clear.  NECK:  Supple, no jugular venous distention. No thyroid enlargement, no tenderness.  LUNGS: Normal breath sounds bilaterally, no wheezing, rales,rhonchi or crepitation. No  use of accessory muscles of respiration.  CARDIOVASCULAR: RRR, S1, S2 normal. No murmurs, rubs, or gallops.  ABDOMEN: Soft, nontender, nondistended. Bowel sounds present. No organomegaly or mass.  EXTREMITIES: No cyanosis, or clubbing. 1+ pitting edema early.  + Bilateral lower extremities are cool to the touch mildly erythematous.  No warmth. + Multiple open wounds present.  See picture below. NEUROLOGIC: Cranial nerves II through XII are intact. Muscle strength 5/5 in all extremities. Sensation intact. Gait not checked.  PSYCHIATRIC: The patient is alert and oriented x 3.  SKIN: No obvious rash. + Bilateral lower extremity wounds.        (^ Right Achilles tendon area)   LABORATORY PANEL:   CBC Recent Labs  Lab 10/01/18 1554  WBC 6.6  HGB 13.5  HCT 41.3  PLT 166   ------------------------------------------------------------------------------------------------------------------  Chemistries  Recent Labs  Lab 10/01/18 1554  NA 140  K 3.5  CL 105  CO2 25  GLUCOSE 95  BUN 35*  CREATININE 1.29*  CALCIUM 8.7*  AST 28  ALT 23  ALKPHOS 49  BILITOT 0.9   ------------------------------------------------------------------------------------------------------------------  Cardiac Enzymes No results for  input(s): TROPONINI in the last 168 hours. ------------------------------------------------------------------------------------------------------------------  RADIOLOGY:  Ct Head Wo Contrast  Result Date: 10/01/2018 CLINICAL DATA:  Altered mental status.  Patient on Xarelto. EXAM: CT HEAD WITHOUT CONTRAST TECHNIQUE: Contiguous axial images were obtained from the base of the skull through the vertex without intravenous contrast. COMPARISON:  October 04, 2006 FINDINGS: Brain: No subdural, epidural, or subarachnoid hemorrhage identified. Ventricles and sulci are prominent but stable. Cerebellum, brainstem, and basal cisterns are within normal limits. No mass effect or midline shift. White matter changes are identified, particularly in the left frontal lobe such as on axial image 18. There appears to be mild involvement over the overlying cortex suggesting a small left frontal infarct. No other evidence of acute ischemia or infarct identified. Vascular: Calcified atherosclerosis is seen in the intracranial carotids. Skull: Normal. Negative for fracture or focal lesion. Sinuses/Orbits: No acute finding. Other: None. IMPRESSION: 1. Focal white matter changes are seen in the left frontal lobe with a small region of overlying cortical involvement consistent with infarct. These findings are age indeterminate but favored to be nonacute based on appearance. Recommend clinical correlation 2. No other acute abnormalities are identified. Electronically Signed   By: Dorise Bullion III M.D   On: 10/01/2018 16:42   Dg Chest Port 1 View  Result Date: 10/01/2018 CLINICAL DATA:  Sepsis. EXAM: PORTABLE CHEST 1 VIEW COMPARISON:  09/25/2018 FINDINGS: The heart is enlarged but stable. There is moderate tortuosity, ectasia and calcification of the thoracic aorta. The pacer wires/AICD is stable. The right IJ power port is stable. The lungs are clear of an acute process. Minimal right basilar scarring or atelectasis. The bony  thorax is intact. IMPRESSION: 1. Stable cardiac enlargement and tortuous ectatic calcified thoracic aorta. 2. Streaky right basilar scarring or atelectasis but no infiltrates or effusions. Electronically Signed   By: Marijo Sanes M.D.   On: 10/01/2018 16:36      IMPRESSION AND PLAN:   Age-indeterminate stroke-  CT head showed age indeterminant stroke in the left frontal lobe.  No focal deficits appreciated.  -MRI brain -Neurology consult -Check ECHO and carotid Dopplers -A1c and lipid panel -Switch lovastatin to Lipitor -No aspirin due to patient being on Xarelto -PT/OT/SLP -Cardiac monitoring  Somnolence- may be due to above.  Not on any meds at home that can potentially cause altered mental  status. Somnolence already improving on my exam. -Will check HIV, RPR, folate, TSH, ammonia, and UDS to rule out other causes of altered mental status  Bilateral lower extremity wounds- patient had lower extremity arterial dopplers on 9/23, which showed minimal atherosclerotic plaque bilaterally without stenosis or occlusion.  Wounds do not appear to be infected. -Wound care consult -Pain control -Elevate legs  Hypertension- normotensive in the ED -Continue home metoprolol  Chronic systolic congestive heart failure- patient does have 1+ bilateral edema on exam. -Continue home Lasix and metoprolol  COPD- stable, no signs of acute exacerbation. -Duonebs PRN  Persistent atrial fibrillation-rate controlled here -Continue home metoprolol and Xarelto  CKD III-creatinine at baseline -Monitor -Avoid nephrotoxic agents  History of DVT -Continue home Xarelto  Hyperlipidemia-stable -Switch lovastatin to Lipitor   All the records are reviewed and case discussed with ED provider. Management plans discussed with the patient, family and they are in agreement.  CODE STATUS: Full  TOTAL TIME TAKING CARE OF THIS PATIENT: 45 minutes.    Berna Spare Kayse Puccini M.D on 10/01/2018 at 6:56 PM  Between 7am  to 6pm - Pager - [phone]  After 6pm go to [email or web address] - Proofreader  [facility]  Office  [phone]  CC: Primary care physician; Ellamae Sia, MD   Note: This dictation was prepared with Dragon dictation along with smaller phrase technology. Any transcriptional errors that result from this process are unintentional.

## 2018-10-01 NOTE — ED Notes (Addendum)
ED TO INPATIENT HANDOFF REPORT  ED Nurse Name and Phone #:  Carma Lair  S Name/Age/Gender Julia Gordon 84 y.o. female Room/Bed: ED04A/ED04A  Code Status   Code Status: Prior  Home/SNF/Other Home Patient oriented to: self Is this baseline? Yes   Triage Complete: Triage complete  Chief Complaint Leg Pain  Triage Note Pt arrival to ED via EMS from wound care center. Pt was at wound care center due to abscess treatment. EMS states that when the doctor took wraps off that the pt became short of breath and that it looked infected.   Dr. wrapped legs back up and sent her to ED.   Allergies Allergies  Allergen Reactions  . No Known Allergies     Level of Care/Admitting Diagnosis ED Disposition    ED Disposition Condition Comment   Admit  The patient appears reasonably stabilized for admission considering the current resources, flow, and capabilities available in the ED at this time, and I doubt any other [facility] requiring further screening and/or treatment in the ED prior to admission is  present.       B Medical/Surgery History Past Medical History:  Diagnosis Date  . Anemia   . Breast cancer ([place]) 2015   a. L mastectomy with chemoradiation  . Colon adenocarcinoma ([place])   . COPD (chronic obstructive pulmonary disease) ([place])   . Depression   . DJD (degenerative joint disease) of knee    right knee  . DVT (deep venous thrombosis) ([place])    a. 06/2018 L popliteal and R peroneal DVTs-->Xarelto.  . H/O hysterectomy with oophorectomy    for DUB  . HFrEF (heart failure with reduced ejection fraction) ([place])    a. 06/2018 Echo: EF 25-30%.  . History of depression   . HLD (hyperlipidemia)   . HTN (hypertension)   . LV (left ventricular) mural thrombus    history of, resolved (echo 04/09)  . NICM (nonischemic cardiomyopathy) ([place])    a. Reports prior h/o cath @ [facility]; b. status post [place] with [employer] lead in 2009 at [facility], Dr. Boyd Kerbs; c. 2014 - prev seen by G.  Lovena Le, MD; c. 06/2018 Echo: EF 25-30%, DD. Diff HK. RVSP 29mmHg. Mildly dil RA. Mild to mod MR. Mod TR. Mild AI.  Marland Kitchen Persistent atrial fibrillation    a. noted in Care Everywhere from 2008-2009; b. CHADS2VASc 6 (CHF, HTN, age x 2, vascular disease, female)-->eliquis added 06/2018 in setting of LLE DVT.  . Valvular heart disease    a. 06/2018 Echo: Mod TR, mild to mod MR, mild AI.   Past Surgical History:  Procedure Laterality Date  . ABDOMINAL HYSTERECTOMY    . BREAST BIOPSY Left 05/10/13   [place] bx-positive  . COLONOSCOPY WITH PROPOFOL N/A 01/15/2017   Procedure: COLONOSCOPY WITH PROPOFOL;  Surgeon: Jonathon Bellows, MD;  Location: [facility] ENDOSCOPY;  Service: Gastroenterology;  Laterality: N/A;  . LAPAROSCOPIC RIGHT COLON RESECTION  08/10   Dr Pat Patrick, for adenoca   . MASTECTOMY Left 2015   BREAST CA  . OOPHORECTOMY    . ORIF FEMUR FRACTURE Right 07/21/2017   Procedure: OPEN REDUCTION INTERNAL FIXATION (ORIF) DISTAL FEMUR FRACTURE;  Surgeon: Lovell Sheehan, MD;  Location: [facility] ORS;  Service: Orthopedics;  Laterality: Right;  femur   . PACEMAKER INSERTION  2009  . REPLACEMENT TOTAL KNEE  1990's   right      A IV Location/Drains/Wounds Patient Lines/Drains/Airways Status   Active Line/Drains/Airways    Name:   Placement date:  Placement time:   Site:   Days:   Peripheral IV 06/27/18 Right Forearm   06/27/18    2200    Forearm   96   Peripheral IV 10/01/18 Right Hand   10/01/18    1550    Hand   less than 1   Peripheral IV 10/01/18 Right Hand   10/01/18    1647    Hand   less than 1   Peripheral IV 10/01/18 Right External jugular   10/01/18    1709    External jugular   less than 1   External Urinary Catheter   07/20/17    0245    -   438   Airway   07/21/17    1130     437   Incision (Closed) 07/21/17 Leg Right   07/21/17    1357     437          Intake/Output Last 24 hours  Intake/Output Summary (Last 24 hours) at 10/01/2018 1901 Last data filed at 10/01/2018 1816 Gross per 24 hour  Intake  512.75 ml  Output -  Net 512.75 ml    Labs/Imaging Results for orders placed or performed during the hospital encounter of 10/01/18 (from the past 48 hour(s))  Lactic acid, plasma     Status: None   Collection Time: 10/01/18  3:54 PM  Result Value Ref Range   Lactic Acid, Venous 1.4 0.5 - 1.9 mmol/L    Comment: Performed at [facility], [address] [place], [place] [address]  Comprehensive metabolic panel     Status: Abnormal   Collection Time: 10/01/18  3:54 PM  Result Value Ref Range   Sodium 140 135 - 145 mmol/L   Potassium 3.5 3.5 - 5.1 mmol/L   Chloride 105 98 - 111 mmol/L   CO2 25 22 - 32 mmol/L   Glucose, Bld 95 70 - 99 mg/dL   BUN 35 (H) 8 - 23 mg/dL   Creatinine, Ser 1.29 (H) 0.44 - 1.00 mg/dL   Calcium 8.7 (L) 8.9 - 10.3 mg/dL   Total Protein 5.9 (L) 6.5 - 8.1 g/dL   Albumin 2.9 (L) 3.5 - 5.0 g/dL   AST 28 15 - 41 U/L   ALT 23 0 - 44 U/L   Alkaline Phosphatase 49 38 - 126 U/L   Total Bilirubin 0.9 0.3 - 1.2 mg/dL   GFR calc non Af Amer 39 (L) >60 mL/min   GFR calc Af Amer 45 (L) >60 mL/min   Anion gap 10 5 - 15    Comment: Performed at [facility], [address] [place],  [address]  CBC WITH DIFFERENTIAL     Status: Abnormal   Collection Time: 10/01/18  3:54 PM  Result Value Ref Range   WBC 6.6 4.0 - 10.5 K/uL   RBC 4.46 3.87 - 5.11 MIL/uL   Hemoglobin 13.5 12.0 - 15.0 g/dL   HCT 41.3 36.0 - 46.0 %   MCV 92.6 80.0 - 100.0 fL   MCH 30.3 26.0 - 34.0 pg   MCHC 32.7 30.0 - 36.0 g/dL   RDW 15.8 (H) 11.5 - 15.5 %   Platelets 166 150 - 400 K/uL   nRBC 0.0 0.0 - 0.2 %   Neutrophils Relative % 69 %   Neutro Abs 4.7 1.7 - 7.7 K/uL   Lymphocytes Relative 18 %   Lymphs Abs 1.2 0.7 - 4.0 K/uL   Monocytes Relative 10 %  Monocytes Absolute 0.7 0.1 - 1.0 K/uL   Eosinophils Relative 1 %   Eosinophils Absolute 0.1 0.0 - 0.5 K/uL   Basophils Relative 1 %   Basophils Absolute 0.0 0.0 - 0.1 K/uL   Immature Granulocytes 1 %   Abs  Immature Granulocytes 0.05 0.00 - 0.07 K/uL    Comment: Performed at [facility], [address] [place], [place] [address]  Procalcitonin     Status: None   Collection Time: 10/01/18  3:54 PM  Result Value Ref Range   Procalcitonin 0.11 ng/mL    Comment:        Interpretation: PCT (Procalcitonin) <= 0.5 ng/mL: Systemic infection (sepsis) is not likely. Local bacterial infection is possible. (NOTE)       Sepsis PCT Algorithm           Lower Respiratory Tract                                      Infection PCT Algorithm    ----------------------------     ----------------------------         PCT < 0.25 ng/mL                PCT < 0.10 ng/mL         Strongly encourage             Strongly discourage   discontinuation of antibiotics    initiation of antibiotics    ----------------------------     -----------------------------       PCT 0.25 - 0.50 ng/mL            PCT 0.10 - 0.25 ng/mL               OR       >80% decrease in PCT            Discourage initiation of                                            antibiotics      Encourage discontinuation           of antibiotics    ----------------------------     -----------------------------         PCT >= 0.50 ng/mL              PCT 0.26 - 0.50 ng/mL               AND        <80% decrease in PCT             Encourage initiation of                                             antibiotics       Encourage continuation           of antibiotics    ----------------------------     -----------------------------        PCT >= 0.50 ng/mL                  PCT > 0.50 ng/mL  AND         increase in PCT                  Strongly encourage                                      initiation of antibiotics    Strongly encourage escalation           of antibiotics                                     -----------------------------                                           PCT <= 0.25 ng/mL                                                  OR                                        > 80% decrease in PCT                                     Discontinue / Do not initiate                                             antibiotics Performed at [facility], [address] [place], [place] [address]   Glucose, capillary     Status: None   Collection Time: 10/01/18  4:08 PM  Result Value Ref Range   Glucose-Capillary 73 70 - 99 mg/dL  Urinalysis, Complete w Microscopic     Status: Abnormal   Collection Time: 10/01/18  5:12 PM  Result Value Ref Range   Color, Urine YELLOW (A) YELLOW   APPearance HAZY (A) CLEAR   Specific Gravity, Urine 1.013 1.005 - 1.030   pH 5.0 5.0 - 8.0   Glucose, UA NEGATIVE NEGATIVE mg/dL   Hgb urine dipstick NEGATIVE NEGATIVE   Bilirubin Urine NEGATIVE NEGATIVE   Ketones, ur NEGATIVE NEGATIVE mg/dL   Protein, ur 30 (A) NEGATIVE mg/dL   Nitrite NEGATIVE NEGATIVE   Leukocytes,Ua NEGATIVE NEGATIVE   RBC / HPF 0-5 0 - 5 RBC/hpf   WBC, UA 0-5 0 - 5 WBC/hpf   Bacteria, UA NONE SEEN NONE SEEN   Squamous Epithelial / LPF 0-5 0 - 5   Hyaline Casts, UA PRESENT     Comment: Performed at [facility], [address], [place], [place] [address]  SARS Coronavirus 2 [facility] order, Performed in [place] hospital lab) Nasopharyngeal     Status: None   Collection Time: 10/01/18  5:29 PM   Specimen: Nasopharyngeal  Result Value Ref Range   SARS Coronavirus 2 NEGATIVE NEGATIVE    Comment: (  NOTE) If result is NEGATIVE SARS-CoV-2 target nucleic acids are NOT DETECTED. The SARS-CoV-2 RNA is generally detectable in upper and lower  respiratory specimens during the acute phase of infection. The lowest  concentration of SARS-CoV-2 viral copies this assay can detect is 250  copies / mL. A negative result does not preclude SARS-CoV-2 infection  and should not be used as the sole basis for treatment or other  patient management decisions.  A negative result may occur with  improper specimen  collection / handling, submission of specimen other  than nasopharyngeal swab, presence of viral mutation(s) within the  areas targeted by this assay, and inadequate number of viral copies  (<250 copies / mL). A negative result must be combined with clinical  observations, patient history, and epidemiological information. If result is POSITIVE SARS-CoV-2 target nucleic acids are DETECTED. The SARS-CoV-2 RNA is generally detectable in upper and lower  respiratory specimens dur ing the acute phase of infection.  Positive  results are indicative of active infection with SARS-CoV-2.  Clinical  correlation with patient history and other diagnostic information is  necessary to determine patient infection status.  Positive results do  not rule out bacterial infection or co-infection with other viruses. If result is PRESUMPTIVE POSTIVE SARS-CoV-2 nucleic acids MAY BE PRESENT.   A presumptive positive result was obtained on the submitted specimen  and confirmed on repeat testing.  While 2019 novel coronavirus  (SARS-CoV-2) nucleic acids may be present in the submitted sample  additional confirmatory testing may be necessary for epidemiological  and / or clinical management purposes  to differentiate between  SARS-CoV-2 and other Sarbecovirus currently known to infect humans.  If clinically indicated additional testing with an alternate test  methodology [phone]) is advised. The SARS-CoV-2 RNA is generally  detectable in upper and lower respiratory sp ecimens during the acute  phase of infection. The expected result is Negative. Fact Sheet for Patients:  StrictlyIdeas.no Fact Sheet for Healthcare Providers: BankingDealers.co.za This test is not yet approved or cleared by the [place] FDA and has been authorized for detection and/or diagnosis of SARS-CoV-2 by FDA under an Emergency Use Authorization (EUA).  This EUA will remain in effect  (meaning this test can be used) for the duration of the COVID-19 declaration under Section 564(b)(1) of the Act, 21 U.S.C. section 360bbb-3(b)(1), unless the authorization is terminated or revoked sooner. Performed at [facility], [place], [place] [address]    Ct Head Wo Contrast  Result Date: 10/01/2018 CLINICAL DATA:  Altered mental status.  Patient on Xarelto. EXAM: CT HEAD WITHOUT CONTRAST TECHNIQUE: Contiguous axial images were obtained from the base of the skull through the vertex without intravenous contrast. COMPARISON:  October 04, 2006 FINDINGS: Brain: No subdural, epidural, or subarachnoid hemorrhage identified. Ventricles and sulci are prominent but stable. Cerebellum, brainstem, and basal cisterns are within normal limits. No mass effect or midline shift. White matter changes are identified, particularly in the left frontal lobe such as on axial image 18. There appears to be mild involvement over the overlying cortex suggesting a small left frontal infarct. No other evidence of acute ischemia or infarct identified. Vascular: Calcified atherosclerosis is seen in the intracranial carotids. Skull: Normal. Negative for fracture or focal lesion. Sinuses/Orbits: No acute finding. Other: None. IMPRESSION: 1. Focal white matter changes are seen in the left frontal lobe with a small region of overlying cortical involvement consistent with infarct. These findings are age indeterminate but favored to be nonacute based  on appearance. Recommend clinical correlation 2. No other acute abnormalities are identified. Electronically Signed   By: Dorise Bullion III M.D   On: 10/01/2018 16:42   Dg Chest Port 1 View  Result Date: 10/01/2018 CLINICAL DATA:  Sepsis. EXAM: PORTABLE CHEST 1 VIEW COMPARISON:  09/25/2018 FINDINGS: The heart is enlarged but stable. There is moderate tortuosity, ectasia and calcification of the thoracic aorta. The pacer wires/AICD is stable. The right IJ  power port is stable. The lungs are clear of an acute process. Minimal right basilar scarring or atelectasis. The bony thorax is intact. IMPRESSION: 1. Stable cardiac enlargement and tortuous ectatic calcified thoracic aorta. 2. Streaky right basilar scarring or atelectasis but no infiltrates or effusions. Electronically Signed   By: Marijo Sanes M.D.   On: 10/01/2018 16:36    Pending Labs Unresulted Labs (From admission, onward)    Start     Ordered   10/01/18 1548  Lactic acid, plasma  Now then every 2 hours,   STAT     10/01/18 1548   10/01/18 1548  Blood Culture (routine x 2)  BLOOD CULTURE X 2,   STAT     10/01/18 1548          Vitals/Pain Today's Vitals   10/01/18 1813 10/01/18 1830 10/01/18 1846 10/01/18 1857  BP: (!) 123/94 (!) 116/100 (!) 105/93   Pulse: 79 75  67  Resp: 16 12 16  (!) 22  Temp:      TempSrc:      SpO2: 98% 98%  97%  Weight:      Height:      PainSc:        Isolation Precautions Airborne and Contact precautions  Medications Medications  aspirin chewable tablet 324 mg (has no administration in time range)  sodium chloride 0.9 % bolus 500 mL ( Intravenous Stopped 10/01/18 1809)  ipratropium-albuterol (DUONEB) 0.5-2.5 (3) MG/3ML nebulizer solution 3 mL (3 mLs Nebulization Given 10/01/18 1812)    Mobility manual wheelchair High fall risk(Per MD order.)   Focused Assessments    R Recommendations: See Admitting Provider Note  Report given to: Jaymes Graff, RN

## 2018-10-01 NOTE — ED Notes (Signed)
Only able to collect one set of blood cultures due to difficult blood draw.

## 2018-10-01 NOTE — ED Notes (Signed)
X-ray at bedside

## 2018-10-02 ENCOUNTER — Inpatient Hospital Stay: Payer: Medicare Other

## 2018-10-02 ENCOUNTER — Inpatient Hospital Stay (HOSPITAL_COMMUNITY)
Admit: 2018-10-02 | Discharge: 2018-10-02 | Disposition: A | Discharge status: Home / Self Care | Payer: Medicare Other | Attending: Internal Medicine | Admitting: Internal Medicine

## 2018-10-02 DIAGNOSIS — I340 Nonrheumatic mitral (valve) insufficiency: Secondary | ICD-10-CM

## 2018-10-02 DIAGNOSIS — L8990 Pressure ulcer of unspecified site, unspecified stage: Secondary | ICD-10-CM | POA: Insufficient documentation

## 2018-10-02 DIAGNOSIS — I361 Nonrheumatic tricuspid (valve) insufficiency: Secondary | ICD-10-CM

## 2018-10-02 DIAGNOSIS — R400 Somnolence: Secondary | ICD-10-CM

## 2018-10-02 LAB — LIPID PANEL
Cholesterol: 134 mg/dL (ref 0–200)
HDL: 43 mg/dL (ref >40)
LDL Cholesterol: 79 mg/dL (ref 0–99)
Total CHOL/HDL Ratio: 3.1 RATIO
Triglycerides: 58 mg/dL (ref <150)
VLDL: 12 mg/dL (ref 0–40)

## 2018-10-02 LAB — TSH: TSH: 3.756 u[IU]/mL (ref 0.350–4.500)

## 2018-10-02 LAB — RPR: RPR Ser Ql: NONREACTIVE

## 2018-10-02 LAB — FOLATE: Folate: 15.7 ng/mL (ref >5.9)

## 2018-10-02 LAB — VITAMIN B12: Vitamin B-12: 990 pg/mL — ABNORMAL HIGH (ref 180–914)

## 2018-10-02 MED ORDER — CEFAZOLIN SODIUM-DEXTROSE 1-4 GM/50ML-% IV SOLN
1.0000 g | Freq: Three times a day (TID) | INTRAVENOUS | Status: DC
  Administered 2018-10-02 – 2018-10-04 (×6): 1 g via INTRAVENOUS
  Filled 2018-10-02 (×10): qty 50

## 2018-10-02 MED ORDER — ORAL CARE MOUTH RINSE
15.0000 mL | Freq: Two times a day (BID) | OROMUCOSAL | Status: DC
  Administered 2018-10-03 – 2018-10-06 (×7): 15 mL via OROMUCOSAL

## 2018-10-02 MED ORDER — PERFLUTREN LIPID MICROSPHERE
1.0000 mL | INTRAVENOUS | Status: AC | PRN
  Administered 2018-10-02: 4 mL via INTRAVENOUS
  Filled 2018-10-02: qty 10

## 2018-10-02 NOTE — Progress Notes (Signed)
Patient ID: Julia Gordon, female   DOB: 1934-04-14, 84 y.o.   MRN: [id number]  [facility] PROGRESS NOTE  Julia Gordon [id number] DOB: 1934-08-18 DOA: 10/01/2018 PCP: Ellamae Sia, MD  HPI/Subjective: Patient feels okay.  States her speech is always been a little slurred.  Feels weak today.  Did not feel like getting out of bed.  Objective: Vitals:   10/02/18 0424 10/02/18 1052  BP: 118/89 (!) 104/93  Pulse: 81 76  Resp: 19 20  Temp: 97.7 F (36.5 C) 98.4 F (36.9 C)  SpO2: 100% (!) 48%    Intake/Output Summary (Last 24 hours) at 10/02/2018 1112 Last data filed at 10/01/2018 1816 Gross per 24 hour  Intake 512.75 ml  Output -  Net 512.75 ml   Filed Weights   10/01/18 1540 10/01/18 2100  Weight: 79.2 kg 80.1 kg    ROS: Review of Systems  Constitutional: Positive for malaise/fatigue. Negative for chills and fever.  Eyes: Negative for blurred vision.  Respiratory: Negative for cough and shortness of breath.   Cardiovascular: Negative for chest pain.  Gastrointestinal: Negative for abdominal pain, constipation, diarrhea, nausea and vomiting.  Genitourinary: Negative for dysuria.  Musculoskeletal: Negative for joint pain.  Neurological: Negative for dizziness and headaches.   Exam: Physical Exam  Constitutional: She is oriented to person, place, and time.  HENT:  Nose: No mucosal edema.  Mouth/Throat: No oropharyngeal exudate or posterior oropharyngeal edema.  Eyes: Pupils are equal, round, and reactive to light. Conjunctivae, EOM and lids are normal.  Neck: No JVD present. Carotid bruit is not present. No edema present. No thyroid mass and no thyromegaly present.  Cardiovascular: S1 normal and S2 normal. Exam reveals no gallop.  No murmur heard. Pulses:      Dorsalis pedis pulses are 2+ on the right side and 2+ on the left side.  Respiratory: No respiratory distress. She has no wheezes. She has no rhonchi. She has no rales.  GI: Soft. Bowel sounds are  normal. There is no abdominal tenderness.  Musculoskeletal:     Right ankle: She exhibits swelling.     Left ankle: She exhibits swelling.  Lymphadenopathy:    She has no cervical adenopathy.  Neurological: She is alert and oriented to person, place, and time. No cranial nerve deficit.  Skin: Skin is warm. No rash noted. Nails show no clubbing.  Psychiatric: She has a normal mood and affect.      Data Reviewed: Basic Metabolic Panel: Recent Labs  Lab 09/25/18 1538 10/01/18 1554  NA 139 140  K 3.2* 3.5  CL 105 105  CO2 24 25  GLUCOSE 102* 95  BUN 34* 35*  CREATININE 1.42* 1.29*  CALCIUM 8.5* 8.7*   Liver Function Tests: Recent Labs  Lab 09/25/18 1538 10/01/18 1554  AST 29 28  ALT 26 23  ALKPHOS 44 49  BILITOT 1.0 0.9  PROT 6.2* 5.9*  ALBUMIN 3.1* 2.9*    Recent Labs  Lab 10/01/18 2313  AMMONIA 27   CBC: Recent Labs  Lab 09/25/18 1538 10/01/18 1554  WBC 6.2 6.6  NEUTROABS 4.1 4.7  HGB 14.5 13.5  HCT 44.4 41.3  MCV 93.5 92.6  PLT 181 166  BNP (last 3 results) Recent Labs    06/27/18 1931 09/25/18 1831  BNP 2,183.0* 1,771.0*    CBG: Recent Labs  Lab 10/01/18 1608  GLUCAP 73    Recent Results (from the past 240 hour(s))  Blood Culture (routine x 2)  Status: None (Preliminary result)   Collection Time: 10/01/18  3:54 PM   Specimen: BLOOD  Result Value Ref Range Status   Specimen Description BLOOD L EJ  Final   Special Requests   Final    BOTTLES DRAWN AEROBIC AND ANAEROBIC Blood Culture results may not be optimal due to an excessive volume of blood received in culture bottles   Culture   Final    NO GROWTH < 24 HOURS Performed at [facility], [address] [place], [place] [address]    Report Status PENDING  Incomplete  SARS Coronavirus 2 [facility] order, Performed in [place] hospital lab) Nasopharyngeal     Status: None   Collection Time: 10/01/18  5:29 PM   Specimen: Nasopharyngeal  Result Value Ref Range Status    SARS Coronavirus 2 NEGATIVE NEGATIVE Final    Comment: (NOTE) If result is NEGATIVE SARS-CoV-2 target nucleic acids are NOT DETECTED. The SARS-CoV-2 RNA is generally detectable in upper and lower  respiratory specimens during the acute phase of infection. The lowest  concentration of SARS-CoV-2 viral copies this assay can detect is 250  copies / mL. A negative result does not preclude SARS-CoV-2 infection  and should not be used as the sole basis for treatment or other  patient management decisions.  A negative result may occur with  improper specimen collection / handling, submission of specimen other  than nasopharyngeal swab, presence of viral mutation(s) within the  areas targeted by this assay, and inadequate number of viral copies  (<250 copies / mL). A negative result must be combined with clinical  observations, patient history, and epidemiological information. If result is POSITIVE SARS-CoV-2 target nucleic acids are DETECTED. The SARS-CoV-2 RNA is generally detectable in upper and lower  respiratory specimens dur ing the acute phase of infection.  Positive  results are indicative of active infection with SARS-CoV-2.  Clinical  correlation with patient history and other diagnostic information is  necessary to determine patient infection status.  Positive results do  not rule out bacterial infection or co-infection with other viruses. If result is PRESUMPTIVE POSTIVE SARS-CoV-2 nucleic acids MAY BE PRESENT.   A presumptive positive result was obtained on the submitted specimen  and confirmed on repeat testing.  While 2019 novel coronavirus  (SARS-CoV-2) nucleic acids may be present in the submitted sample  additional confirmatory testing may be necessary for epidemiological  and / or clinical management purposes  to differentiate between  SARS-CoV-2 and other Sarbecovirus currently known to infect humans.  If clinically indicated additional testing with an alternate test   methodology [phone]) is advised. The SARS-CoV-2 RNA is generally  detectable in upper and lower respiratory sp ecimens during the acute  phase of infection. The expected result is Negative. Fact Sheet for Patients:  StrictlyIdeas.no Fact Sheet for Healthcare Providers: BankingDealers.co.za This test is not yet approved or cleared by the [place] FDA and has been authorized for detection and/or diagnosis of SARS-CoV-2 by FDA under an Emergency Use Authorization (EUA).  This EUA will remain in effect (meaning this test can be used) for the duration of the COVID-19 declaration under Section 564(b)(1) of the Act, 21 U.S.C. section 360bbb-3(b)(1), unless the authorization is terminated or revoked sooner. Performed at [facility], [address], [place], [place] [address]      Studies: Ct Head Wo Contrast  Result Date: 10/01/2018 CLINICAL DATA:  Altered mental status.  Patient on Xarelto. EXAM: CT HEAD WITHOUT CONTRAST TECHNIQUE: Contiguous axial images  were obtained from the base of the skull through the vertex without intravenous contrast. COMPARISON:  October 04, 2006 FINDINGS: Brain: No subdural, epidural, or subarachnoid hemorrhage identified. Ventricles and sulci are prominent but stable. Cerebellum, brainstem, and basal cisterns are within normal limits. No mass effect or midline shift. White matter changes are identified, particularly in the left frontal lobe such as on axial image 18. There appears to be mild involvement over the overlying cortex suggesting a small left frontal infarct. No other evidence of acute ischemia or infarct identified. Vascular: Calcified atherosclerosis is seen in the intracranial carotids. Skull: Normal. Negative for fracture or focal lesion. Sinuses/Orbits: No acute finding. Other: None. IMPRESSION: 1. Focal white matter changes are seen in the left frontal lobe with a small region of overlying  cortical involvement consistent with infarct. These findings are age indeterminate but favored to be nonacute based on appearance. Recommend clinical correlation 2. No other acute abnormalities are identified. Electronically Signed   By: Dorise Bullion III M.D   On: 10/01/2018 16:42   Dg Chest Port 1 View  Result Date: 10/01/2018 CLINICAL DATA:  Sepsis. EXAM: PORTABLE CHEST 1 VIEW COMPARISON:  09/25/2018 FINDINGS: The heart is enlarged but stable. There is moderate tortuosity, ectasia and calcification of the thoracic aorta. The pacer wires/AICD is stable. The right IJ power port is stable. The lungs are clear of an acute process. Minimal right basilar scarring or atelectasis. The bony thorax is intact. IMPRESSION: 1. Stable cardiac enlargement and tortuous ectatic calcified thoracic aorta. 2. Streaky right basilar scarring or atelectasis but no infiltrates or effusions. Electronically Signed   By: Marijo Sanes M.D.   On: 10/01/2018 16:36    Scheduled Meds: . atorvastatin  40 mg Oral q1800  . furosemide  40 mg Oral BID  . metoprolol succinate  50 mg Oral Daily  . Rivaroxaban  15 mg Oral Q supper   Continuous Infusions: .  ceFAZolin (ANCEF) IV      Assessment/Plan:  1. Stroke left frontal lobe age-indeterminate.  Carotid ultrasound done but still pending.  Echocardiogram done previously.  Patient given aspirin yesterday.  On Xarelto.  Neurology consult pending.  Physical therapy was about to walk with the patient. 2. Acute metabolic encephalopathy.  Seems a little bit better today than yesterday. 3. Bilateral lower extremity weeping wounds.  Continue Lasix.  Add Ancef.  Wound care consult pending.  May end up needing Unna boots.  Follows up with the wound care center as outpatient. 4. Chronic systolic congestive heart failure.  Continue Lasix for the weeping edema.  On low-dose metoprolol.  Watch blood pressure to see if ACE inhibitor can be added. 5. Right lower extremity DVT on  Xarelto. 6. Chronic atrial fibrillation on Xarelto and metoprolol. 7. Chronic kidney disease stage III. 8. Hyperlipidemia lovastatin switched to Lipitor.  Code Status:     Code Status Orders  (From admission, onward)         Start     Ordered   10/01/18 2142  Full code  Continuous     10/01/18 2141        Code Status History    Date Active Date Inactive Code Status Order ID Comments User Context   06/27/2018 2303 06/30/2018 1504 Full Code [id number]  Mayer Camel, NP ED   07/20/2017 0231 07/23/2017 1926 Full Code [id number]  Lance Coon, MD Inpatient   Advance Care Planning Activity     Family Communication: Spoke with daughter on the phone Disposition Plan:  To be determined  Antibiotics:  Ancef  Time spent: 28 minutes  [place]

## 2018-10-02 NOTE — Evaluation (Signed)
Physical Therapy Evaluation Patient Details Name: Julia Gordon MRN: [id number] DOB: 07-Mar-1934 Today's Date: 10/02/2018   History of Present Illness  Per neurologist assessment: Pt is an 84 y.o. female with a history of atrial fibrillation on Xarelto who presented lethargic.  Currently appears at baseline.  Alert.  Head CT reviewed and shows areas of hypodensity likely secondary to small vessel ischemic changes and chronic ischemic changes.  Patient unable to have MRI but on anticoagulation.  Carotid dopplers show no evidence of hemodynamically significant stenosis.  MD assessment also includes: Acute metabolic encephalopathy, BLE weeping wounds, chronic CHF, Right lower extremity DVT on Xarelto, chronic a-fib, CKD III, and HLD.    Clinical Impression  Pt presented with deficits in strength, transfers, mobility, gait, balance, and activity tolerance.  Pt required mod A for bed mobility tasks and was unable to come to clear the surface of the bed during multiple sit to/from stand transfer attempts even with +2 mod A.  Pt functionally weak to BLEs but strength symmetrical with no deficits in sensation or coordination noted.  Pt presents with significant deficits in functional mobility compared to her baseline and would not be safe to return to her prior living situation at this time.  Pt will benefit from PT services in a SNF setting upon discharge to safely address above deficits for decreased caregiver assistance and eventual return to PLOF.      Follow Up Recommendations SNF    Equipment Recommendations  None recommended by PT    Recommendations for Other Services       Precautions / Restrictions Precautions Precautions: Fall Precaution Comments: High Fall Restrictions Weight Bearing Restrictions: No      Mobility  Bed Mobility Overal bed mobility: Needs Assistance Bed Mobility: Supine to Sit;Sit to Supine Rolling: Min assist   Supine to sit: Mod assist Sit to supine: Mod  assist   General bed mobility comments: Mod A for BLEs in and out of bed, limited by pain  Transfers Overall transfer level: Needs assistance Equipment used: Rolling walker (2 wheeled) Transfers: Sit to/from Stand Sit to Stand: +2 physical assistance;Mod assist         General transfer comment: Multiple attempts made for pt to stand from an elevated EOB with +2 mod A with pt ultimately unable to clear the surface of the bed limited by BLE pain and weakness  Ambulation/Gait             General Gait Details: Unable  Stairs            Wheelchair Mobility    Modified Rankin (Stroke Patients Only)       Balance Overall balance assessment: Needs assistance Sitting-balance support: Feet supported;Bilateral upper extremity supported Sitting balance-Leahy Scale: Fair Sitting balance - Comments: Pt able to sit up in bed with BLE and back supported. Uses bue to reach for items on meal tray. Will continue to monitor unsupported seated balance.                                     Pertinent Vitals/Pain Pain Assessment: No/denies pain    Home Living Family/patient expects to be discharged to:: Private residence Living Arrangements: Other relatives Available Help at Discharge: Family;Available 24 hours/day Type of Home: House Home Access: Stairs to enter Entrance Stairs-Rails: Left Entrance Stairs-Number of Steps: 3 Home Layout: One level Home Equipment: Walker - 2 wheels  Prior Function Level of Independence: Independent with assistive device(s)         Comments: Per pt daughter, pt was independent with ADLs/IADLs. No falls in the last 12 months. Occasionally uses 2WW for amb.     Hand Dominance   Dominant Hand: Right    Extremity/Trunk Assessment   Upper Extremity Assessment Upper Extremity Assessment: Generalized weakness;RUE deficits/detail;LUE deficits/detail RUE Deficits / Details: RUE and LUE strength grossly 3+/5 and  symmetrical RUE Sensation: WNL RUE Coordination: WNL LUE Deficits / Details: RUE and LUE strength grossly 3+/5 and symmetrical LUE Sensation: WNL LUE Coordination: WNL    Lower Extremity Assessment Lower Extremity Assessment: Generalized weakness;RLE deficits/detail;LLE deficits/detail RLE Deficits / Details: RLE and LLE strength grossly 3+/5 and symmetrical with some difficulty assessing secondary to pain in weeping wounds with movement RLE: Unable to fully assess due to pain RLE Sensation: WNL LLE Deficits / Details: RLE and LLE strength grossly 3+/5 and symmetrical with some difficulty assessing secondary to pain in weeping wounds with movement LLE: Unable to fully assess due to pain LLE Sensation: WNL       Communication   Communication: No difficulties  Cognition Arousal/Alertness: Awake/alert Behavior During Therapy: WFL for tasks assessed/performed Overall Cognitive Status: No family/caregiver present to determine baseline cognitive functioning Area of Impairment: Orientation;Following commands                 Orientation Level: Disoriented to;Place;Time;Situation     Following Commands: Follows one step commands consistently       General Comments: Pt alert to self, date, and location      General Comments      Exercises Total Joint Exercises Ankle Circles/Pumps: AROM;Strengthening;Both;5 reps;10 reps Quad Sets: Strengthening;Both;5 reps;10 reps Heel Slides: AAROM;Both;5 reps Hip ABduction/ADduction: AAROM;Both;5 reps Straight Leg Raises: AAROM;Both;5 reps Long Arc Quad: AROM;Both;10 reps Knee Flexion: AROM;Both;10 reps Other Exercises Other Exercises: Static and dynamic sitting activities at the EOB including reaching outside BOS x 5 min for OOB activity tolerance and core therex   Assessment/Plan    PT Assessment Patient needs continued PT services  PT Problem List Decreased strength;Decreased activity tolerance;Decreased balance;Decreased  mobility;Decreased knowledge of use of DME       PT Treatment Interventions DME instruction;Gait training;Stair training;Functional mobility training;Therapeutic activities;Therapeutic exercise;Balance training;Patient/family education    PT Goals (Current goals can be found in the Care Plan section)  Acute Rehab PT Goals Patient Stated Goal: To go home PT Goal Formulation: With patient Time For Goal Achievement: 10/15/18 Potential to Achieve Goals: Fair    Frequency Min 2X/week   Barriers to discharge Inaccessible home environment      Co-evaluation               AM-PAC PT "6 Clicks" Mobility  Outcome Measure Help needed turning from your back to your side while in a flat bed without using bedrails?: A Lot Help needed moving from lying on your back to sitting on the side of a flat bed without using bedrails?: A Lot Help needed moving to and from a bed to a chair (including a wheelchair)?: Total Help needed standing up from a chair using your arms (e.g., wheelchair or bedside chair)?: Total Help needed to walk in hospital room?: Total Help needed climbing 3-5 steps with a railing? : Total 6 Click Score: 8    End of Session Equipment Utilized During Treatment: Gait belt;Oxygen Activity Tolerance: Patient limited by pain Patient left: in bed;with call bell/phone within reach;with bed alarm set;with nursing/sitter  in room;Other (comment)(Nursing addressing BLE wounds at end of session) Nurse Communication: Mobility status PT Visit Diagnosis: Muscle weakness (generalized) (M62.81);Difficulty in walking, not elsewhere classified (R26.2)    Time: [id number] PT Time Calculation (min) (ACUTE ONLY): 29 min   Charges:   PT Evaluation $PT Eval Moderate Complexity: 1 Mod PT Treatments $Therapeutic Exercise: 8-22 mins        D. Scott Lakechia Nay PT, DPT 10/02/18, 1:50 PM

## 2018-10-02 NOTE — Evaluation (Signed)
Occupational Therapy Evaluation Patient Details Name: Julia Gordon MRN: [id number] DOB: 1934/09/13 Today's Date: 10/02/2018    History of Present Illness From MD H&P: "Julia Gordon  is a 84 y.o. female with a known history of hypertension, chronic systolic CHF, COPD, depression, breast cancer, persistent a-fib, history of DVT on xarelto who was sent to the ED from wound clinic due to somnolence. She also endorses some bilateral lower extremity pain due to her leg wounds.  Patient states that her leg wounds have been there for a couple of week."   Clinical Impression   Julia Gordon was seen for OT evaluation this date. Upon arrival to pt room, pt was asleep in bed. Pt wakes minimally to verbal stimuli, but rouses further with tactile cues. Pt oriented to self only. Pleasently confused but able to be redirected to tasks at hand. Due to pt decreased cognition/orientation during assessment, this author reached out to pt primary medical contact (her daughter Julia Gordon) for information on PLOF. Per pt daughter, pt was active and generally independent prior to hospitalization. She states her mother was "cooking, cleaning, and taking care of her grand babies" until initial symptoms which prompted admission. Per Julia Gordon, Julia Gordon lives with her grandson and granddaughter in a 1 level home with 3 steps to enter at the front.  Currently pt demonstrates impairments in cognition, orientation, strength, and activity tolerance requiring set-up assist for bed level grooming and self-feeding as well as moderate assist for heavy ADL mgt including dressing and bathing. Pt would benefit from skilled OT to address noted impairments and functional limitations (see below for any additional details) in order to maximize safety and independence while minimizing falls risk and caregiver burden. Recommend pt DC to STR to maximize pt safety and return to PLOF.     Follow Up Recommendations  SNF    Equipment Recommendations  3 in 1  bedside commode    Recommendations for Other Services       Precautions / Restrictions Precautions Precautions: Fall Precaution Comments: High Fall      Mobility Bed Mobility Overal bed mobility: Needs Assistance Bed Mobility: Rolling Rolling: Min assist         General bed mobility comments: Pt requires min assist to reposition self in bed. Uses BUEs to support self on bed rails.  Transfers                 General transfer comment: Functional t/fs deferred 2/2 pt decreased orientation and safety awareness this date.    Balance Overall balance assessment: Needs assistance Sitting-balance support: Feet supported;Bilateral upper extremity supported Sitting balance-Leahy Scale: Fair Sitting balance - Comments: Pt able to sit up in bed with BLE and back supported. Uses bue to reach for items on meal tray. Will continue to monitor unsupported seated balance.                                   ADL either performed or assessed with clinical judgement   ADL Overall ADL's : Needs assistance/impaired Eating/Feeding: Set up;Sitting       Upper Body Bathing: Set up;Sitting   Lower Body Bathing: Sitting/lateral leans;Moderate assistance;Cueing for safety;Cueing for sequencing   Upper Body Dressing : Cueing for sequencing;Cueing for safety;Sitting;Set up;Minimal assistance   Lower Body Dressing: Moderate assistance;Sit to/from stand;Set up  Vision         Perception     Praxis      Pertinent Vitals/Pain Pain Assessment: No/denies pain(monitored throughout session.)     Hand Dominance Right   Extremity/Trunk Assessment Upper Extremity Assessment Upper Extremity Assessment: Generalized weakness(Pt grossly 3/5 t/o BUE.)   Lower Extremity Assessment Lower Extremity Assessment: Generalized weakness;Defer to PT evaluation       Communication Communication Communication: Expressive difficulties(Speech garbled at  times and difficult to understand. Improves as pt becomes more alert.)   Cognition Arousal/Alertness: Lethargic;Awake/alert(Initially lethargic, but wakes to VC's becomes more alert t/o session.) Behavior During Therapy: [facility] for tasks assessed/performed Overall Cognitive Status: Impaired/Different from baseline Area of Impairment: Orientation;Following commands                 Orientation Level: Disoriented to;Place;Time;Situation     Following Commands: Follows one step commands consistently       General Comments: Pt orineted to self only, initially states "I'm in the basement downstairs" when asked if she knows where she is. Is easily re-directed and pleasent, but does not retain orientation information when provided.   General Comments       Exercises Other Exercises Other Exercises: Pt assisted with set-up of meal tray.   Shoulder Instructions      Home Living Family/patient expects to be discharged to:: Private residence Living Arrangements: Other relatives(Grdson and grddtr.) Available Help at Discharge: Family;Available 24 hours/day Type of Home: House Home Access: Stairs to enter [employer] of Steps: 3 front 5 at back Entrance Stairs-Rails: Left(Front only) Home Layout: One level     Bathroom Shower/Tub: Teacher, early years/pre: Handicapped height     Home Equipment: Environmental consultant - 2 wheels          Prior Functioning/Environment Level of Independence: Independent with assistive device(s)        Comments: Per pt daughter, pt was independent with ADLs/IADLs. No falls in the last 12 months. Occasionally uses 2WW for amb.        OT Problem List: Decreased strength;Decreased coordination;Decreased range of motion;Decreased activity tolerance;Decreased safety awareness;Decreased knowledge of use of DME or AE;Impaired balance (sitting and/or standing)      OT Treatment/Interventions: Self-care/ADL training;Balance training;Therapeutic  exercise;Therapeutic activities;DME and/or AE instruction;Patient/family education    OT Goals(Current goals can be found in the care plan section) Acute Rehab OT Goals Patient Stated Goal: To go home OT Goal Formulation: With patient/family Time For Goal Achievement: 10/16/18 Potential to Achieve Goals: Good  OT Frequency: Min 2X/week   Barriers to D/C:            Co-evaluation              AM-PAC OT "6 Clicks" Daily Activity     Outcome Measure Help from another person eating meals?: A Little Help from another person taking care of personal grooming?: A Little Help from another person toileting, which includes using toliet, bedpan, or urinal?: A Lot Help from another person bathing (including washing, rinsing, drying)?: A Lot Help from another person to put on and taking off regular upper body clothing?: A Little Help from another person to put on and taking off regular lower body clothing?: A Lot 6 Click Score: 15   End of Session Nurse Communication: Other (comment)(Pt orientation)  Activity Tolerance: Patient tolerated treatment well Patient left: in bed;with call bell/phone within reach;with bed alarm set  OT Visit Diagnosis: Other abnormalities of gait and mobility (R26.89);Muscle weakness (generalized) (M62.81)  Time: [id number] OT Time Calculation (min): 23 min Charges:  OT General Charges $OT Visit: 1 Visit OT Evaluation $OT Eval Moderate Complexity: 1 Mod OT Treatments $Self Care/Home Management : 8-22 mins  Shara Blazing, M.S., OTR/L Ascom: [phone] 10/02/18, 10:32 AM

## 2018-10-02 NOTE — Plan of Care (Signed)
  Problem: Education: Goal: Knowledge of General Education information will improve Description: Including pain rating scale, medication(s)/side effects and non-pharmacologic comfort measures Outcome: Progressing   Problem: Health Behavior/Discharge Planning: Goal: Ability to manage health-related needs will improve Outcome: Progressing   Problem: Clinical Measurements: Goal: Ability to maintain clinical measurements within normal limits will improve Outcome: Progressing Goal: Will remain free from infection Outcome: Progressing Goal: Diagnostic test results will improve Outcome: Progressing Goal: Respiratory complications will improve Outcome: Progressing Goal: Cardiovascular complication will be avoided Outcome: Progressing   Problem: Activity: Goal: Risk for activity intolerance will decrease Outcome: Progressing   Problem: Nutrition: Goal: Adequate nutrition will be maintained Outcome: Progressing   Problem: Coping: Goal: Level of anxiety will decrease Outcome: Progressing   Problem: Elimination: Goal: Will not experience complications related to bowel motility Outcome: Progressing Goal: Will not experience complications related to urinary retention Outcome: Progressing   Problem: Pain Managment: Goal: General experience of comfort will improve Outcome: Progressing   Problem: Safety: Goal: Ability to remain free from injury will improve Outcome: Progressing   Problem: Skin Integrity: Goal: Risk for impaired skin integrity will decrease Outcome: Progressing   Problem: Education: Goal: Knowledge of disease or condition will improve Outcome: Progressing   Problem: Coping: Goal: Will verbalize positive feelings about self Outcome: Progressing Goal: Will identify appropriate support needs Outcome: Progressing   Problem: Health Behavior/Discharge Planning: Goal: Ability to manage health-related needs will improve Outcome: Progressing   Problem:  Self-Care: Goal: Ability to participate in self-care as condition permits will improve Outcome: Progressing Goal: Verbalization of feelings and concerns over difficulty with self-care will improve Outcome: Progressing Goal: Ability to communicate needs accurately will improve Outcome: Progressing   Problem: Nutrition: Goal: Risk of aspiration will decrease Outcome: Progressing Goal: Dietary intake will improve Outcome: Progressing

## 2018-10-02 NOTE — Consult Note (Signed)
Referring Physician: Mayo    Chief Complaint: Lethargy  HPI: Julia Gordon is an 84 y.o. female with a known history of hypertension, chronic systolic CHF, COPD, depression, breast cancer, persistent a-fib, history of DVT on xarelto who was sent to the ED from wound clinic due to somnolence.  Per daughter at bedside, patient has been sleepy on and off throughout the day yesterday.  She had a hard time waking her up to get her in the car for her wound clinic appointment.  While she was being evaluated by the doctor, she was continuously falling asleep, so she was sent to the ED for further evaluation.  NIHSS of 3.    Date last known well: Date: 09/30/2018 Time last known well: Unable to determine tPA Given: No: Unable to determine LKW  Past Medical History:  Diagnosis Date  . Anemia   . Breast cancer ([place]) 2015   a. L mastectomy with chemoradiation  . Colon adenocarcinoma ([place])   . COPD (chronic obstructive pulmonary disease) ([place])   . Depression   . DJD (degenerative joint disease) of knee    right knee  . DVT (deep venous thrombosis) ([place])    a. 06/2018 L popliteal and R peroneal DVTs-->Xarelto.  . H/O hysterectomy with oophorectomy    for DUB  . HFrEF (heart failure with reduced ejection fraction) ([place])    a. 06/2018 Echo: EF 25-30%.  . History of depression   . HLD (hyperlipidemia)   . HTN (hypertension)   . LV (left ventricular) mural thrombus    history of, resolved (echo 04/09)  . NICM (nonischemic cardiomyopathy) ([place])    a. Reports prior h/o cath @ [facility]; b. status post [place] with [employer] lead in 2009 at [facility], Dr. Boyd Kerbs; c. 2014 - prev seen by G. Lovena Le, MD; c. 06/2018 Echo: EF 25-30%, DD. Diff HK. RVSP 68mmHg. Mildly dil RA. Mild to mod MR. Mod TR. Mild AI.  Marland Kitchen Persistent atrial fibrillation    a. noted in Care Everywhere from 2008-2009; b. CHADS2VASc 6 (CHF, HTN, age x 2, vascular disease, female)-->eliquis added 06/2018 in setting of LLE DVT.  . Valvular heart  disease    a. 06/2018 Echo: Mod TR, mild to mod MR, mild AI.    Past Surgical History:  Procedure Laterality Date  . ABDOMINAL HYSTERECTOMY    . BREAST BIOPSY Left 05/10/13   [place] bx-positive  . COLONOSCOPY WITH PROPOFOL N/A 01/15/2017   Procedure: COLONOSCOPY WITH PROPOFOL;  Surgeon: Jonathon Bellows, MD;  Location: [facility] ENDOSCOPY;  Service: Gastroenterology;  Laterality: N/A;  . LAPAROSCOPIC RIGHT COLON RESECTION  08/10   Dr Pat Patrick, for adenoca   . MASTECTOMY Left 2015   BREAST CA  . OOPHORECTOMY    . ORIF FEMUR FRACTURE Right 07/21/2017   Procedure: OPEN REDUCTION INTERNAL FIXATION (ORIF) DISTAL FEMUR FRACTURE;  Surgeon: Lovell Sheehan, MD;  Location: [facility] ORS;  Service: Orthopedics;  Laterality: Right;  femur   . PACEMAKER INSERTION  2009  . REPLACEMENT TOTAL KNEE  1990's   right     Family History  Problem Relation Age of Onset  . Hypertension Father   . Alzheimer's disease Mother   . Breast cancer Sister 66  . Diabetes Other        brothers  . Stroke Brother   . Heart failure Brother   . Lupus Sister    Social History:  reports that she has quit smoking. She has never used smokeless tobacco. She reports that she  does not drink alcohol or use drugs.  Allergies:  Allergies  Allergen Reactions  . No Known Allergies     Medications:  I have reviewed the patient's current medications. Prior to Admission:  Medications Prior to Admission  Medication Sig Dispense Refill Last Dose  . alendronate (FOSAMAX) 70 MG tablet TAKE 1 TAB WEEKLY 30 MINS BEFORE BREAKFAST WITH A FULL GLASS OF WATER. DO NOT LIE DOWN FOR 30 MINS 12 tablet 3 Past Week at Unknown time  . furosemide (LASIX) 20 MG tablet Take 40 mg (2 tablets) by mouth every morning, take 1 tablet (20 mg total) once in the afternoon. 90 tablet 6 unknown at unknown  . lovastatin (MEVACOR) 40 MG tablet Take 2 tablets by mouth Daily.   unknwon at [employer]  . metoprolol succinate (TOPROL-XL) 50 MG 24 hr tablet Take 1 tablet (50 mg total)  by mouth daily. 30 tablet 6 unknown at unknown  . potassium chloride SA (K-DUR) 20 MEQ tablet Take 1 tablet (20 mEq total) by mouth daily. 90 tablet 3 unknown at [facility]  . rivaroxaban (XARELTO) 15 MG TABS tablet Take 1 tablet (15 mg total) by mouth daily with supper. 30 tablet 6 unknown at [facility]  . traMADol (ULTRAM) 50 MG tablet Take 1 tablet (50 mg total) by mouth every 6 (six) hours as needed. 15 tablet 0 prn at prn   Scheduled: . atorvastatin  40 mg Oral q1800  . furosemide  40 mg Oral BID  . metoprolol succinate  50 mg Oral Daily  . Rivaroxaban  15 mg Oral Q supper    ROS: History obtained from the patient  General ROS: negative for - chills, fatigue, fever, night sweats, weight gain or weight loss Psychological ROS: negative for - behavioral disorder, hallucinations, memory difficulties, mood swings or suicidal ideation Ophthalmic ROS: negative for - blurry vision, double vision, eye pain or loss of vision ENT ROS: negative for - epistaxis, nasal discharge, oral lesions, sore throat, tinnitus or vertigo Allergy and Immunology ROS: negative for - hives or itchy/watery eyes Hematological and Lymphatic ROS: negative for - bleeding problems, bruising or swollen lymph nodes Endocrine ROS: negative for - galactorrhea, hair pattern changes, polydipsia/polyuria or temperature intolerance Respiratory ROS: negative for - cough, hemoptysis, shortness of breath or wheezing Cardiovascular ROS: LE edema Gastrointestinal ROS: negative for - abdominal pain, diarrhea, hematemesis, nausea/vomiting or stool incontinence Genito-Urinary ROS: negative for - dysuria, hematuria, incontinence or urinary frequency/urgency Musculoskeletal ROS: right knee pain Neurological ROS: as noted in HPI Dermatological ROS: multiple LE skin lesion  Physical Examination: Blood pressure (!) 104/93, pulse 76, temperature 98.4 F (36.9 C), temperature source Oral, resp. rate 20, height 5\' 2"  (1.575 m), weight 80.1 kg,  SpO2 (!) 48 %.  HEENT-  Normocephalic, no lesions, without obvious abnormality.  Normal external eye and conjunctiva.  Normal TM's bilaterally.  Normal auditory canals and external ears. Normal external nose, mucus membranes and septum.  Normal pharynx. Cardiovascular- S1, S2 normal, pulses palpable throughout   Lungs- chest clear, no wheezing, rales, normal symmetric air entry Abdomen- soft, non-tender; bowel sounds normal; no masses,  no organomegaly Extremities- LE edema Lymph-no adenopathy palpable Musculoskeletal-right knee swelling Skin-LE's erythematous with some open and weeping lesions noted.    Neurological Examination   Mental Status: Alert, oriented to name place and month.  Speech fluent without evidence of aphasia.  Able to follow 3 step commands without difficulty. Cranial Nerves: II: Discs flat bilaterally; Visual fields grossly normal, pupils equal, round, reactive to  light and accommodation III,IV, VI: ptosis not present, extra-ocular motions intact bilaterally V,VII: smile symmetric, facial light touch sensation normal bilaterally VIII: hearing normal bilaterally IX,X: gag reflex present XI: bilateral shoulder shrug XII: midline tongue extension Motor: Patient able to lift al extremities against gravity.  No drift noted in the upper extremities.  Generalized weakness in the lower extremities.   Sensory: Pinprick and light touch intact throughout, bilaterally Deep Tendon Reflexes: Symmetric throughout Plantars: Right: mute   Left: mute Cerebellar: Normal finger-to-nose testing bilaterally Gait: not tested due to safety concerns  Laboratory Studies:  Basic Metabolic Panel: Recent Labs  Lab 09/25/18 1538 10/01/18 1554  NA 139 140  K 3.2* 3.5  CL 105 105  CO2 24 25  GLUCOSE 102* 95  BUN 34* 35*  CREATININE 1.42* 1.29*  CALCIUM 8.5* 8.7*    Liver Function Tests: Recent Labs  Lab 09/25/18 1538 10/01/18 1554  AST 29 28  ALT 26 23  ALKPHOS 44 49   BILITOT 1.0 0.9  PROT 6.2* 5.9*  ALBUMIN 3.1* 2.9*   No results for input(s): LIPASE, AMYLASE in the last 168 hours. Recent Labs  Lab 10/01/18 2313  AMMONIA 27    CBC: Recent Labs  Lab 09/25/18 1538 10/01/18 1554  WBC 6.2 6.6  NEUTROABS 4.1 4.7  HGB 14.5 13.5  HCT 44.4 41.3  MCV 93.5 92.6  PLT 181 166    Cardiac Enzymes: No results for input(s): CKTOTAL, CKMB, CKMBINDEX, TROPONINI in the last 168 hours.  BNP: Invalid input(s): POCBNP  CBG: Recent Labs  Lab 10/01/18 [id number]    Microbiology: Results for orders placed or performed during the hospital encounter of 10/01/18  Blood Culture (routine x 2)     Status: None (Preliminary result)   Collection Time: 10/01/18  3:54 PM   Specimen: BLOOD  Result Value Ref Range Status   Specimen Description BLOOD L EJ  Final   Special Requests   Final    BOTTLES DRAWN AEROBIC AND ANAEROBIC Blood Culture results may not be optimal due to an excessive volume of blood received in culture bottles   Culture   Final    NO GROWTH < 24 HOURS Performed at [facility], [address], [place], [place] [address]    Report Status PENDING  Incomplete  SARS Coronavirus 2 [facility] order, Performed in [place] hospital lab) Nasopharyngeal     Status: None   Collection Time: 10/01/18  5:29 PM   Specimen: Nasopharyngeal  Result Value Ref Range Status   SARS Coronavirus 2 NEGATIVE NEGATIVE Final    Comment: (NOTE) If result is NEGATIVE SARS-CoV-2 target nucleic acids are NOT DETECTED. The SARS-CoV-2 RNA is generally detectable in upper and lower  respiratory specimens during the acute phase of infection. The lowest  concentration of SARS-CoV-2 viral copies this assay can detect is 250  copies / mL. A negative result does not preclude SARS-CoV-2 infection  and should not be used as the sole basis for treatment or other  patient management decisions.  A negative result may occur with  improper specimen collection  / handling, submission of specimen other  than nasopharyngeal swab, presence of viral mutation(s) within the  areas targeted by this assay, and inadequate number of viral copies  (<250 copies / mL). A negative result must be combined with clinical  observations, patient history, and epidemiological information. If result is POSITIVE SARS-CoV-2 target nucleic acids are DETECTED. The SARS-CoV-2 RNA is generally detectable in upper and  lower  respiratory specimens dur ing the acute phase of infection.  Positive  results are indicative of active infection with SARS-CoV-2.  Clinical  correlation with patient history and other diagnostic information is  necessary to determine patient infection status.  Positive results do  not rule out bacterial infection or co-infection with other viruses. If result is PRESUMPTIVE POSTIVE SARS-CoV-2 nucleic acids MAY BE PRESENT.   A presumptive positive result was obtained on the submitted specimen  and confirmed on repeat testing.  While 2019 novel coronavirus  (SARS-CoV-2) nucleic acids may be present in the submitted sample  additional confirmatory testing may be necessary for epidemiological  and / or clinical management purposes  to differentiate between  SARS-CoV-2 and other Sarbecovirus currently known to infect humans.  If clinically indicated additional testing with an alternate test  methodology [phone]) is advised. The SARS-CoV-2 RNA is generally  detectable in upper and lower respiratory sp ecimens during the acute  phase of infection. The expected result is Negative. Fact Sheet for Patients:  StrictlyIdeas.no Fact Sheet for Healthcare Providers: BankingDealers.co.za This test is not yet approved or cleared by the [place] FDA and has been authorized for detection and/or diagnosis of SARS-CoV-2 by FDA under an Emergency Use Authorization (EUA).  This EUA will remain in effect (meaning this  test can be used) for the duration of the COVID-19 declaration under Section 564(b)(1) of the Act, 21 U.S.C. section 360bbb-3(b)(1), unless the authorization is terminated or revoked sooner. Performed at [facility], [address] [place], [place] [address]     Coagulation Studies: No results for input(s): LABPROT, INR in the last 72 hours.  Urinalysis:  Recent Labs  Lab 10/01/18 1712  COLORURINE YELLOW*  LABSPEC 1.013  PHURINE 5.0  GLUCOSEU NEGATIVE  HGBUR NEGATIVE  BILIRUBINUR NEGATIVE  KETONESUR NEGATIVE  PROTEINUR 30*  NITRITE NEGATIVE  LEUKOCYTESUR NEGATIVE    Lipid Panel:    Component Value Date/Time   CHOL 134 10/02/2018 0535   TRIG 58 10/02/2018 0535   HDL 43 10/02/2018 0535   CHOLHDL 3.1 10/02/2018 0535   VLDL 12 10/02/2018 0535   LDLCALC 79 10/02/2018 0535    HgbA1C: No results found for: HGBA1C  Urine Drug Screen:      Component Value Date/Time   LABOPIA NONE DETECTED 10/01/2018 1712   COCAINSCRNUR NONE DETECTED 10/01/2018 1712   LABBENZ NONE DETECTED 10/01/2018 1712   AMPHETMU NONE DETECTED 10/01/2018 1712   THCU NONE DETECTED 10/01/2018 1712   LABBARB NONE DETECTED 10/01/2018 1712    Alcohol Level: No results for input(s): ETH in the last 168 hours.  Other results: EKG: atrial fibrillation, rate 76 bpm.  Imaging: Ct Head Wo Contrast  Result Date: 10/01/2018 CLINICAL DATA:  Altered mental status.  Patient on Xarelto. EXAM: CT HEAD WITHOUT CONTRAST TECHNIQUE: Contiguous axial images were obtained from the base of the skull through the vertex without intravenous contrast. COMPARISON:  October 04, 2006 FINDINGS: Brain: No subdural, epidural, or subarachnoid hemorrhage identified. Ventricles and sulci are prominent but stable. Cerebellum, brainstem, and basal cisterns are within normal limits. No mass effect or midline shift. White matter changes are identified, particularly in the left frontal lobe such as on axial image 18. There  appears to be mild involvement over the overlying cortex suggesting a small left frontal infarct. No other evidence of acute ischemia or infarct identified. Vascular: Calcified atherosclerosis is seen in the intracranial carotids. Skull: Normal. Negative for fracture or focal lesion. Sinuses/Orbits: No acute finding. Other:  None. IMPRESSION: 1. Focal white matter changes are seen in the left frontal lobe with a small region of overlying cortical involvement consistent with infarct. These findings are age indeterminate but favored to be nonacute based on appearance. Recommend clinical correlation 2. No other acute abnormalities are identified. Electronically Signed   By: Dorise Bullion III M.D   On: 10/01/2018 16:42   Dg Chest Port 1 View  Result Date: 10/01/2018 CLINICAL DATA:  Sepsis. EXAM: PORTABLE CHEST 1 VIEW COMPARISON:  09/25/2018 FINDINGS: The heart is enlarged but stable. There is moderate tortuosity, ectasia and calcification of the thoracic aorta. The pacer wires/AICD is stable. The right IJ power port is stable. The lungs are clear of an acute process. Minimal right basilar scarring or atelectasis. The bony thorax is intact. IMPRESSION: 1. Stable cardiac enlargement and tortuous ectatic calcified thoracic aorta. 2. Streaky right basilar scarring or atelectasis but no infiltrates or effusions. Electronically Signed   By: Marijo Sanes M.D.   On: 10/01/2018 16:36    Assessment: 84 y.o. female with a history of atrial fibrillation on Xarelto who presented lethargic on yesterday.  Currently appears at baseline  Alert.  Head CT reviewed and shows areas of hypodensity likely secondary to small vessel ischemic changes and chronic ischemic changes.  Patient unable to have MRI but on anticoagulation.  Carotid dopplers show no evidence of hemodynamically significant stenosis.  Echocardiogram pending.  A1c pending, LDL 79.  Stroke Risk Factors - atrial fibrillation, hyperlipidemia and  hypertension  Plan: 1. Continue Xarelto 2. PT consult, OT consult, Speech consult 3. Echocardiogram pending 4. Prophylactic therapy-Continue Xarelto 5. Frequent neuro checks 6. Aggressive lipid management with target LDL<70.   Alexis Goodell, MD Neurology [phone] 10/02/2018, 11:50 AM

## 2018-10-02 NOTE — Progress Notes (Signed)
Patient has not voided post admission appr at 2200. Bladder scan showed 300cc's. Patient has no complaints of discomfort/ urgency. Will report to oncoming RN.

## 2018-10-02 NOTE — Evaluation (Signed)
SLP Cancellation Note  Patient Details Name: Julia Gordon MRN: [id number] DOB: Aug 23, 1934   Cancelled treatment:       Reason Eval/Treat Not Completed: SLP screened, no needs identified, will sign off  Pt was difficult to wake for screening.  Pt had no reports of choking or deficits with speech at this time. NSG will contact ST if changes occur.   [name] 10/02/2018, 9:10 AM

## 2018-10-03 LAB — CBC
HCT: 40.9 % (ref 36.0–46.0)
Hemoglobin: 13.6 g/dL (ref 12.0–15.0)
MCH: 30.5 pg (ref 26.0–34.0)
MCHC: 33.3 g/dL (ref 30.0–36.0)
MCV: 91.7 fL (ref 80.0–100.0)
Platelets: 158 10*3/uL (ref 150–400)
RBC: 4.46 MIL/uL (ref 3.87–5.11)
RDW: 15.7 % — ABNORMAL HIGH (ref 11.5–15.5)
WBC: 8.1 10*3/uL (ref 4.0–10.5)
nRBC: 0 % (ref 0.0–0.2)

## 2018-10-03 LAB — ECHOCARDIOGRAM COMPLETE
Height: 62 in
Weight: 2825.42 oz

## 2018-10-03 LAB — BASIC METABOLIC PANEL
Anion gap: 13 (ref 5–15)
BUN: 32 mg/dL — ABNORMAL HIGH (ref 8–23)
CO2: 24 mmol/L (ref 22–32)
Calcium: 8.6 mg/dL — ABNORMAL LOW (ref 8.9–10.3)
Chloride: 104 mmol/L (ref 98–111)
Creatinine, Ser: 1.28 mg/dL — ABNORMAL HIGH (ref 0.44–1.00)
GFR calc Af Amer: 45 mL/min — ABNORMAL LOW (ref >60)
GFR calc non Af Amer: 39 mL/min — ABNORMAL LOW (ref >60)
Glucose, Bld: 99 mg/dL (ref 70–99)
Potassium: 3.1 mmol/L — ABNORMAL LOW (ref 3.5–5.1)
Sodium: 141 mmol/L (ref 135–145)

## 2018-10-03 LAB — HIV ANTIBODY (ROUTINE TESTING W REFLEX): HIV Screen 4th Generation wRfx: NONREACTIVE

## 2018-10-03 MED ORDER — POTASSIUM CHLORIDE CRYS ER 20 MEQ PO TBCR
40.0000 meq | EXTENDED_RELEASE_TABLET | Freq: Once | ORAL | Status: AC
  Administered 2018-10-03: 15:00:00 40 meq via ORAL
  Filled 2018-10-03: qty 2

## 2018-10-03 MED ORDER — METOPROLOL SUCCINATE ER 25 MG PO TB24
12.5000 mg | ORAL_TABLET | Freq: Every day | ORAL | Status: DC
  Administered 2018-10-04 – 2018-10-07 (×3): 12.5 mg via ORAL
  Filled 2018-10-03 (×3): qty 1

## 2018-10-03 MED ORDER — MORPHINE SULFATE (PF) 2 MG/ML IV SOLN: 1.0000 mg | INTRAVENOUS | Status: DC | PRN

## 2018-10-03 MED ORDER — QUETIAPINE FUMARATE 25 MG PO TABS
12.5000 mg | ORAL_TABLET | Freq: Every day | ORAL | Status: DC
  Administered 2018-10-03: 23:00:00 12.5 mg via ORAL
  Filled 2018-10-03: qty 1

## 2018-10-03 MED ORDER — HALOPERIDOL LACTATE 5 MG/ML IJ SOLN
1.0000 mg | Freq: Four times a day (QID) | INTRAMUSCULAR | Status: DC | PRN
  Administered 2018-10-07: 1 mg via INTRAVENOUS
  Filled 2018-10-03: qty 1

## 2018-10-03 MED ORDER — SACUBITRIL-VALSARTAN 24-26 MG PO TABS
1.0000 | ORAL_TABLET | Freq: Two times a day (BID) | ORAL | Status: DC
  Administered 2018-10-03 – 2018-10-08 (×9): 1 via ORAL
  Filled 2018-10-03 (×11): qty 1

## 2018-10-03 NOTE — Progress Notes (Signed)
NIH Lower extremity assessment limited due to pain.

## 2018-10-03 NOTE — Progress Notes (Signed)
Patient ID: Julia Gordon, female   DOB: [birth year], 84 y.o.   MRN: [id number]  [employer] PROGRESS NOTE  Julia Gordon [id number] DOB: 1934/05/17 DOA: 10/01/2018 PCP: Ellamae Sia, MD  HPI/Subjective: Patient feels okay.  Did not sleep last night as per nursing staff.  She also pulled out a few IVs.  Patient stands that the ulcer on her left back of the leg hurts a lot.  Objective: Vitals:   10/03/18 0540 10/03/18 0954  BP: 124/87 127/85  Pulse: 81 85  Resp: 18 18  Temp: 97.7 F (36.5 C) 98.1 F (36.7 C)  SpO2: 100% 98%    Intake/Output Summary (Last 24 hours) at 10/03/2018 1200 Last data filed at 10/03/2018 0900 Gross per 24 hour  Intake 460 ml  Output 1400 ml  Net -940 ml   Filed Weights   10/01/18 1540 10/01/18 2100  Weight: 79.2 kg 80.1 kg    ROS: Review of Systems  Constitutional: Positive for malaise/fatigue. Negative for chills and fever.  Eyes: Negative for blurred vision.  Respiratory: Negative for cough and shortness of breath.   Cardiovascular: Negative for chest pain.  Gastrointestinal: Negative for abdominal pain, constipation, diarrhea, nausea and vomiting.  Genitourinary: Negative for dysuria.  Musculoskeletal: Positive for joint pain.  Neurological: Negative for dizziness and headaches.   Exam: Physical Exam  Constitutional: She is oriented to person, place, and time.  HENT:  Nose: No mucosal edema.  Mouth/Throat: No oropharyngeal exudate or posterior oropharyngeal edema.  Eyes: Pupils are equal, round, and reactive to light. Conjunctivae, EOM and lids are normal.  Neck: No JVD present. Carotid bruit is not present. No edema present. No thyroid mass and no thyromegaly present.  Cardiovascular: S1 normal and S2 normal. Exam reveals no gallop.  No murmur heard. Pulses:      Dorsalis pedis pulses are 2+ on the right side and 2+ on the left side.  Respiratory: No respiratory distress. She has no wheezes. She has no rhonchi. She has no  rales.  GI: Soft. Bowel sounds are normal. There is no abdominal tenderness.  Musculoskeletal:     Right ankle: She exhibits swelling.     Left ankle: She exhibits swelling.  Lymphadenopathy:    She has no cervical adenopathy.  Neurological: She is alert and oriented to person, place, and time. No cranial nerve deficit.  Skin: Skin is warm. Nails show no clubbing.  Numerous ulcers bilateral lower extremities.  The patient's most painful ulcer is on the left posterior leg.  Chronic lower extremity skin discoloration and slight warmth.  Psychiatric: She has a normal mood and affect.      Data Reviewed: Basic Metabolic Panel: Recent Labs  Lab 10/01/18 1554 10/03/18 0444  NA 140 141  K 3.5 3.1*  CL 105 104  CO2 25 24  GLUCOSE 95 99  BUN 35* 32*  CREATININE 1.29* 1.28*  CALCIUM 8.7* 8.6*   Liver Function Tests: Recent Labs  Lab 10/01/18 1554  AST 28  ALT 23  ALKPHOS 49  BILITOT 0.9  PROT 5.9*  ALBUMIN 2.9*    Recent Labs  Lab 10/01/18 2313  AMMONIA 27   CBC: Recent Labs  Lab 10/01/18 1554 10/03/18 0444  WBC 6.6 8.1  NEUTROABS 4.7  --   HGB 13.5 13.6  HCT 41.3 40.9  MCV 92.6 91.7  PLT 166 158  BNP (last 3 results) Recent Labs    06/27/18 1931 09/25/18 1831  BNP 2,183.0* 1,771.0*    CBG:  Recent Labs  Lab 10/01/18 1608  GLUCAP 73    Recent Results (from the past 240 hour(s))  Blood Culture (routine x 2)     Status: None (Preliminary result)   Collection Time: 10/01/18  3:54 PM   Specimen: BLOOD  Result Value Ref Range Status   Specimen Description BLOOD L EJ  Final   Special Requests   Final    BOTTLES DRAWN AEROBIC AND ANAEROBIC Blood Culture results may not be optimal due to an excessive volume of blood received in culture bottles   Culture   Final    NO GROWTH 2 DAYS Performed at [facility], [address] [place], [place] [address]    Report Status PENDING  Incomplete  SARS Coronavirus 2 [facility] order, Performed in [place] hospital lab) Nasopharyngeal     Status: None   Collection Time: 10/01/18  5:29 PM   Specimen: Nasopharyngeal  Result Value Ref Range Status   SARS Coronavirus 2 NEGATIVE NEGATIVE Final    Comment: (NOTE) If result is NEGATIVE SARS-CoV-2 target nucleic acids are NOT DETECTED. The SARS-CoV-2 RNA is generally detectable in upper and lower  respiratory specimens during the acute phase of infection. The lowest  concentration of SARS-CoV-2 viral copies this assay can detect is 250  copies / mL. A negative result does not preclude SARS-CoV-2 infection  and should not be used as the sole basis for treatment or other  patient management decisions.  A negative result may occur with  improper specimen collection / handling, submission of specimen other  than nasopharyngeal swab, presence of viral mutation(s) within the  areas targeted by this assay, and inadequate number of viral copies  (<250 copies / mL). A negative result must be combined with clinical  observations, patient history, and epidemiological information. If result is POSITIVE SARS-CoV-2 target nucleic acids are DETECTED. The SARS-CoV-2 RNA is generally detectable in upper and lower  respiratory specimens dur ing the acute phase of infection.  Positive  results are indicative of active infection with SARS-CoV-2.  Clinical  correlation with patient history and other diagnostic information is  necessary to determine patient infection status.  Positive results do  not rule out bacterial infection or co-infection with other viruses. If result is PRESUMPTIVE POSTIVE SARS-CoV-2 nucleic acids MAY BE PRESENT.   A presumptive positive result was obtained on the submitted specimen  and confirmed on repeat testing.  While 2019 novel coronavirus  (SARS-CoV-2) nucleic acids may be present in the submitted sample  additional confirmatory testing may be necessary for epidemiological  and / or clinical management purposes  to  differentiate between  SARS-CoV-2 and other Sarbecovirus currently known to infect humans.  If clinically indicated additional testing with an alternate test  methodology [phone]) is advised. The SARS-CoV-2 RNA is generally  detectable in upper and lower respiratory sp ecimens during the acute  phase of infection. The expected result is Negative. Fact Sheet for Patients:  StrictlyIdeas.no Fact Sheet for Healthcare Providers: BankingDealers.co.za This test is not yet approved or cleared by the [place] FDA and has been authorized for detection and/or diagnosis of SARS-CoV-2 by FDA under an Emergency Use Authorization (EUA).  This EUA will remain in effect (meaning this test can be used) for the duration of the COVID-19 declaration under Section 564(b)(1) of the Act, 21 U.S.C. section 360bbb-3(b)(1), unless the authorization is terminated or revoked sooner. Performed at [facility], [address] [place], [place] [address]  Studies: Ct Head Wo Contrast  Result Date: 10/01/2018 CLINICAL DATA:  Altered mental status.  Patient on Xarelto. EXAM: CT HEAD WITHOUT CONTRAST TECHNIQUE: Contiguous axial images were obtained from the base of the skull through the vertex without intravenous contrast. COMPARISON:  October 04, 2006 FINDINGS: Brain: No subdural, epidural, or subarachnoid hemorrhage identified. Ventricles and sulci are prominent but stable. Cerebellum, brainstem, and basal cisterns are within normal limits. No mass effect or midline shift. White matter changes are identified, particularly in the left frontal lobe such as on axial image 18. There appears to be mild involvement over the overlying cortex suggesting a small left frontal infarct. No other evidence of acute ischemia or infarct identified. Vascular: Calcified atherosclerosis is seen in the intracranial carotids. Skull: Normal. Negative for fracture or focal  lesion. Sinuses/Orbits: No acute finding. Other: None. IMPRESSION: 1. Focal white matter changes are seen in the left frontal lobe with a small region of overlying cortical involvement consistent with infarct. These findings are age indeterminate but favored to be nonacute based on appearance. Recommend clinical correlation 2. No other acute abnormalities are identified. Electronically Signed   By: Dorise Bullion III M.D   On: 10/01/2018 16:42   US Carotid Bilateral (at [facility] Only)  Result Date: 10/02/2018 CLINICAL DATA:  84 year old female with a history of stroke EXAM: BILATERAL CAROTID DUPLEX ULTRASOUND TECHNIQUE: Pearline Cables scale imaging, color Doppler and duplex ultrasound were performed of bilateral carotid and vertebral arteries in the neck. COMPARISON:  None. FINDINGS: Criteria: Quantification of carotid stenosis is based on velocity parameters that correlate the residual internal carotid diameter with NASCET-based stenosis levels, using the diameter of the distal internal carotid lumen as the denominator for stenosis measurement. The following velocity measurements were obtained: RIGHT ICA:  Systolic 41 cm/sec, Diastolic 19 cm/sec CCA:  31 cm/sec SYSTOLIC ICA/CCA RATIO:  1.3 ECA:  32 cm/sec LEFT ICA:  Systolic 66 cm/sec, Diastolic 13 cm/sec CCA:  63 cm/sec SYSTOLIC ICA/CCA RATIO:  1.0 ECA:  59 cm/sec Right Brachial SBP: Not acquired Left Brachial SBP: Not acquired RIGHT CAROTID ARTERY: Calcification of the right common carotid artery. Intermediate waveform maintained. Heterogeneous and partially calcified plaque at the right carotid bifurcation. No significant lumen shadowing. Low resistance waveform of the right ICA. No significant tortuosity. RIGHT VERTEBRAL ARTERY: Antegrade flow with low resistance waveform. LEFT CAROTID ARTERY: Calcifications of the left common carotid artery. Intermediate waveform maintained. Heterogeneous and partially calcified plaque at the left carotid bifurcation without  significant lumen shadowing. Low resistance waveform of the left ICA. No significant tortuosity. LEFT VERTEBRAL ARTERY:  Antegrade flow with low resistance waveform. IMPRESSION: Color duplex indicates moderate heterogeneous and calcified plaque, with no hemodynamically significant stenosis by duplex criteria in the extracranial cerebrovascular circulation. Signed, Dulcy Fanny. Dellia Nims, RPVI [facility] Radiology Electronically Signed   By: Gordon Mckusick D.O.   On: 10/02/2018 11:51   Dg Chest Port 1 View  Result Date: 10/01/2018 CLINICAL DATA:  Sepsis. EXAM: PORTABLE CHEST 1 VIEW COMPARISON:  09/25/2018 FINDINGS: The heart is enlarged but stable. There is moderate tortuosity, ectasia and calcification of the thoracic aorta. The pacer wires/AICD is stable. The right IJ power port is stable. The lungs are clear of an acute process. Minimal right basilar scarring or atelectasis. The bony thorax is intact. IMPRESSION: 1. Stable cardiac enlargement and tortuous ectatic calcified thoracic aorta. 2. Streaky right basilar scarring or atelectasis but no infiltrates or effusions. Electronically Signed   By: Mamie Nick.  Gallerani M.D.   On: 10/01/2018 16:36    Scheduled Meds: . atorvastatin  40 mg Oral q1800  . furosemide  40 mg Oral BID  . mouth rinse  15 mL Mouth Rinse BID  . metoprolol succinate  12.5 mg Oral Daily  . potassium chloride  40 mEq Oral Once  . QUEtiapine  12.5 mg Oral QHS  . Rivaroxaban  15 mg Oral Q supper  . sacubitril-valsartan  1 tablet Oral BID   Continuous Infusions: .  ceFAZolin (ANCEF) IV 1 g (10/03/18 [id number])    Assessment/Plan:  1. Stroke left frontal lobe age-indeterminate.  Carotid ultrasound negative..  Echocardiogram shows an ejection fraction less than 20%..  Patient given aspirin on admission.  On Xarelto.  Neurology consult appreciated.  Physical therapy recommended rehab.. 2. Acute metabolic encephalopathy.  Patient was agitated  overnight and pulled out some IVs.  We will give Seroquel tonight so she sleeps.  When I was talking with her today she answered questions appropriately. 3. Bilateral lower extremity weeping wounds.  Continue Lasix.  Continue Ancef.  Wound care consult pending.  May end up needing Unna boots.  Follows up with the wound care center as outpatient. 4. Chronic systolic congestive heart failure.  Continue Lasix for the weeping edema.  Restart low-dose Toprol-XL 12.5 mg twice a day.  Try to start Entresto lowest dose this evening. 5. Right lower extremity DVT on Xarelto. 6. Chronic atrial fibrillation on Xarelto and metoprolol. 7. Chronic kidney disease stage III. 8. Hyperlipidemia lovastatin switched to Lipitor.  Code Status:     Code Status Orders  (From admission, onward)         Start     Ordered   10/01/18 2142  Full code  Continuous     10/01/18 2141        Code Status History    Date Active Date Inactive Code Status Order ID Comments User Context   06/27/2018 2303 06/30/2018 1504 Full Code [id number]  Mayer Camel, NP ED   07/20/2017 0231 07/23/2017 1926 Full Code [id number]  Lance Coon, MD Inpatient   Advance Care Planning Activity     Family Communication: Spoke with daughter on the phone Disposition Plan: Potentially out to rehab.  Antibiotics:  Ancef  Time spent: 28 minutes  [place]

## 2018-10-04 DIAGNOSIS — I70235 Atherosclerosis of native arteries of right leg with ulceration of other part of foot: Secondary | ICD-10-CM

## 2018-10-04 DIAGNOSIS — I10 Essential (primary) hypertension: Secondary | ICD-10-CM

## 2018-10-04 DIAGNOSIS — I70245 Atherosclerosis of native arteries of left leg with ulceration of other part of foot: Secondary | ICD-10-CM

## 2018-10-04 LAB — HEMOGLOBIN A1C
Hgb A1c MFr Bld: 6.5 % — ABNORMAL HIGH (ref 4.8–5.6)
Mean Plasma Glucose: 140 mg/dL

## 2018-10-04 MED ORDER — INFLUENZA VAC A&B SA ADJ QUAD 0.5 ML IM PRSY
0.5000 mL | PREFILLED_SYRINGE | INTRAMUSCULAR | Status: AC
  Administered 2018-10-05: 0.5 mL via INTRAMUSCULAR
  Filled 2018-10-04: qty 0.5

## 2018-10-04 MED ORDER — PNEUMOCOCCAL VAC POLYVALENT 25 MCG/0.5ML IJ INJ
0.5000 mL | INJECTION | INTRAMUSCULAR | Status: AC
  Administered 2018-10-05: 09:00:00 0.5 mL via INTRAMUSCULAR
  Filled 2018-10-04: qty 0.5

## 2018-10-04 MED ORDER — CHLORHEXIDINE GLUCONATE CLOTH 2 % EX PADS
6.0000 | MEDICATED_PAD | Freq: Every day | CUTANEOUS | Status: DC
  Administered 2018-10-04 – 2018-10-08 (×4): 6 via TOPICAL

## 2018-10-04 MED ORDER — CEFAZOLIN SODIUM-DEXTROSE 1-4 GM/50ML-% IV SOLN
1.0000 g | Freq: Two times a day (BID) | INTRAVENOUS | Status: DC
  Administered 2018-10-04 – 2018-10-05 (×2): 1 g via INTRAVENOUS
  Filled 2018-10-04 (×3): qty 50

## 2018-10-04 NOTE — Care Management Important Message (Signed)
Important Message  Patient Details  Name: Julia Gordon MRN: [id number] Date of Birth: 07-05-1934   Medicare Important Message Given:  Yes     Dannette Barbara 10/04/2018, 10:18 AM

## 2018-10-04 NOTE — Progress Notes (Signed)
PHARMACY NOTE:  ANTIMICROBIAL RENAL DOSAGE ADJUSTMENT  Current antimicrobial regimen includes a mismatch between antimicrobial dosage and estimated renal function.  As per policy approved by the Pharmacy & Therapeutics and Medical Executive Committees, the antimicrobial dosage will be adjusted accordingly.  Current antimicrobial dosage:  Cefazolin 1g Q8H  Indication: Cellulitis   Renal Function:  Estimated Creatinine Clearance: 33.2 mL/min (A) (by C-G formula based on SCr of 1.28 mg/dL (H)).   Antimicrobial dosage has been changed to:  Cefazolin 1g Q12H   Additional comments: Will f/u on renal function with AM labs    Thank you for allowing pharmacy to be a part of this patient's care.  Rowland Lathe, [facility] 10/04/2018 1:10 PM

## 2018-10-04 NOTE — TOC Initial Note (Signed)
Transition of Care [facility]) - Initial/Assessment Note    Patient Details  Name: Julia Gordon MRN: [id number] Date of Birth: [birth year]  Transition of [facility] CM/SW Contact:    Candie Chroman, LCSW Phone Number: 10/04/2018, 10:04 AM  Clinical Narrative:  Readmission prevention screen started. CSW met with patient. No supports at bedside. CSW introduced role and explained that PT recommendations would be discussed. Patient is not interested in SNF placement but is agreeable to home health. CSW notified her that her insurance is not a great payer for home health so will start search for agencies that can take her. Patient stated her grandchildren live with her and someone is usually at home with her. Patient uses a walker at home. She uses oxygen prn at home. Patient uses a [employer] service to get to appts. No concerns with affording medications. No further concerns. CSW encouraged patient to contact CSW as needed. CSW will continue to follow patient for support and facilitate return home when stable.               Expected Discharge Plan: [place] Barriers to Discharge: Continued Medical Work up   Patient Goals and CMS Choice        Expected Discharge Plan and Services Expected Discharge Plan: [place] Choice: [place] arrangements for the past 2 months: Single Family Home                                      Prior Living Arrangements/Services Living arrangements for the past 2 months: Single Family Home Lives with:: Relatives(Grandchildren.) Patient language and need for interpreter reviewed:: Yes Do you feel safe going back to the place where you live?: Yes      Need for Family Participation in Patient Care: Yes (Comment) Care giver support system in place?: Yes (comment) Current home services: DME Criminal Activity/Legal Involvement Pertinent to Current Situation/Hospitalization: No - Comment as  needed  Activities of Daily Living Home Assistive Devices/Equipment: Walker (specify type) ADL Screening (condition at time of admission) Patient's cognitive ability adequate to safely complete daily activities?: Yes Is the patient deaf or have difficulty hearing?: No Does the patient have difficulty seeing, even when wearing glasses/contacts?: No Does the patient have difficulty concentrating, remembering, or making decisions?: Yes Patient able to express need for assistance with ADLs?: Yes Does the patient have difficulty dressing or bathing?: No Independently performs ADLs?: No Communication: Needs assistance Is this a change from baseline?: Change from baseline, expected to last >3 days Dressing (OT): Needs assistance Is this a change from baseline?: Change from baseline, expected to last >3 days Grooming: Needs assistance Is this a change from baseline?: Change from baseline, expected to last >3 days Feeding: Independent Bathing: Needs assistance Is this a change from baseline?: Change from baseline, expected to last >3 days Toileting: Needs assistance Is this a change from baseline?: Change from baseline, expected to last >3days In/Out Bed: Needs assistance Is this a change from baseline?: Change from baseline, expected to last <3 days Walks in Home: Needs assistance Is this a change from baseline?: Change from baseline, expected to last <3 days Does the patient have difficulty walking or climbing stairs?: Yes Weakness of Legs: Both Weakness of Arms/Hands: Both  Permission Sought/Granted Permission sought to share information with : Chartered certified accountant granted  to share information with : Yes, Verbal Permission Granted     Permission granted to share info w AGENCY: Home Health Agencies        Emotional Assessment Appearance:: Appears stated age Attitude/Demeanor/Rapport: Engaged, Gracious Affect (typically observed): Accepting, Appropriate, Calm,  Pleasant Orientation: : Oriented to Self, Oriented to Place, Oriented to  Time, Oriented to Situation Alcohol / Substance Use: Never Used Psych Involvement: No (comment)  Admission diagnosis:  Somnolence [R40.0] Wound of left lower extremity, initial encounter [S81.802A] Stroke [facility]) [I63.9] Patient Active Problem List   Diagnosis Date Noted  . Pressure injury of skin 10/02/2018  . Somnolence 10/01/2018  . Stroke ([place]) 10/01/2018  . Persistent atrial fibrillation 09/23/2018  . Skin ulcer, limited to breakdown of skin ([place]) 09/23/2018  . Left arm swelling 09/23/2018  . Acute deep vein thrombosis (DVT) of left lower extremity ([place]) 06/27/2018  . Age-related osteoporosis with current pathological fracture with routine healing 07/24/2017  . Breast cancer ([place]) 07/24/2017  . Femur fracture ([place]) 07/20/2017  . HTN (hypertension) 07/20/2017  . PAF (paroxysmal atrial fibrillation) ([place]) 07/20/2017  . Primary cancer of upper inner quadrant of left female breast ([place]) 07/17/2014  . Acute on chronic HFrEF (heart failure with reduced ejection fraction) ([place]) 03/24/2011  . ICD (implantable cardiac defibrillator) in place 03/24/2011  . Dyslipidemia 03/24/2011  . Other primary cardiomyopathies    PCP:  Ellamae Sia, MD Pharmacy:   [place], [place] [address] [place] [address] Phone: [phone] Fax: [phone]  [id number] [place] [address] [id number]: [phone]: [id number]: [phone]: [phone]    Social Determinants of Health (SDOH) Interventions    Readmission Risk Interventions Readmission Risk Prevention Plan 10/04/2018  Transportation Screening Complete  Social Work Consult for [place]. [place]Counseling [place] Applic[place]  Medication Review (Press photographer Complete  Some recent data might be hidden

## 2018-10-04 NOTE — Consult Note (Signed)
[place] Nurse wound consult note Patient receiving care in [facility].  Patient's daughter present at the time of my assessment. Reason for Consult: BLE ulcers Wound type: arterial in nature Measurement: The LLE pretibial wound measures 2.2 cm x 2.6 cm is 100% black and dry.  The LLE achillles tendon wound measures 3 cm x 4 cm and is 100% black and dry.  There is no palpable pulse at the time of my assessment in the left foot.  There are multiple scattered ulcerations over BLE that are quite small and currently covered with pieces of Mepitel.  Her daughter explained that Mepitel is the only dressing she has been able to tolerate. BLE are erythematous, the L > R.  She could not tolerate even the lightest touch on exam today to the LLE. Dressing procedure/placement/frequency: Apply iodine from the iodine swabs in clean utility to all wounds.  If the patient can tolerated, cover the LLE achilles and pre-tibial wounds with foam dressings.  If she cannot, then apply pieces of Mepitel Kellie Simmering [phone]) over the areas.  Change the Mepitel every 3 days and prn. I had a very open discussion with them, that if there is not adequate blood flow to the wounds, the wounds cannot heal but rather worsen. Monitor the wound area(s) for worsening of condition such as: Signs/symptoms of infection,  Increase in size,  Development of or worsening of odor, Development of pain, or increased pain at the affected locations.  Notify the medical team if any of these develop.  Thank you for the consult.  Discussed plan of care with the patient and bedside nurse.  [place] nurse will not follow at this time.  Please re-consult the [place] team if needed.  Val Riles, RN, MSN, CWOCN, CNS-BC, pager [phone]

## 2018-10-04 NOTE — Consult Note (Signed)
[facility] V[facility] Consult Note  MRN : [id number]  Julia Gordon is a 84 y.o. (March 19, 1934) female who presents with chief complaint of  Chief Complaint  Patient presents with  . Wound Infection   History of Present Illness:  The patient is an 84 year old female with multiple medical issues (see below) who presented to the [facility] emergency department on October 01, 2018 via EMS from the [place] [facility] for "infected wounds to the legs".  Unsure if the patient is a reliable historian - she stated she first noticed her wounds "yesterday".  Information for this consult was obtained from previous epic notation, treatment team and bedside nurse.  Patient was seen at the [facility] wound care clinic on October 01, 2018 where staff noted the patient to be very lethargic.  Wound center staff felt the patient may be septic due to bilateral lower extremity wounds coupled with her lethargy and called EMS for further work-up at our ER.  At this time, the patient notes her left lower extremity is more "painful" when compared to the right.  She notes the pain to be located mostly around the heel where there is a larger ulceration.  She also states her legs "hurt her" when she walks: however she is not really able to tell me where in the leg.  She denies any shortness of breath or chest pain.  She denies any fever, nausea vomiting.  Vascular surgery was consulted by Dr. Vito Berger for further recommendations  Current Facility-Administered Medications  Medication Dose Route Frequency Provider Last Rate Last Dose  . acetaminophen (TYLENOL) tablet 650 mg  650 mg Oral Q4H PRN Mayo, Pete Pelt, MD       Or  . acetaminophen (TYLENOL) solution 650 mg  650 mg Per Tube Q4H PRN Mayo, Pete Pelt, MD       Or  . acetaminophen (TYLENOL) suppository 650 mg  650 mg Rectal Q4H PRN Mayo, Pete Pelt, MD      .  atorvastatin (LIPITOR) tablet 40 mg  40 mg Oral q1800 Mayo, Pete Pelt, MD   40 mg at 10/03/18 1832  . ceFAZolin (ANCEF) IVPB 1 g/50 mL premix  1 g Intravenous Q12H Rowland Lathe, RPH      . Chlorhexidine Gluconate Cloth 2 % PADS 6 each  6 each Topical Daily Wieting, Richard, MD   6 each at 10/04/18 1011  . furosemide (LASIX) tablet 40 mg  40 mg Oral BID Mayo, Pete Pelt, MD   40 mg at 10/04/18 1011  . haloperidol lactate (HALDOL) injection 1 mg  1 mg Intravenous Q6H PRN Loletha Grayer, MD      . Derrill Memo ON 10/05/2018] influenza vaccine adjuvanted (FLUAD) injection 0.5 mL  0.5 mL Intramuscular Tomorrow-1000 Wieting, Richard, MD      . ipratropium-albuterol (DUONEB) 0.5-2.5 (3) MG/3ML nebulizer solution 3 mL  3 mL Nebulization Q6H PRN Mayo, Pete Pelt, MD      . MEDLINE mouth rinse  15 mL Mouth Rinse BID Loletha Grayer, MD   15 mL at 10/04/18 1012  . metoprolol succinate (TOPROL-XL) 24 hr tablet 12.5 mg  12.5 mg Oral Daily Loletha Grayer, MD   12.5 mg at 10/04/18 1010  . morphine 2 MG/ML injection 1 mg  1 mg Intravenous Q3H PRN Loletha Grayer, MD      . Derrill Memo ON 10/05/2018] pneumococcal 23 valent vaccine (PNU-IMMUNE) injection 0.5 mL  0.5 mL Intramuscular Tomorrow-1000  Loletha Grayer, MD      . Rivaroxaban Alveda Reasons) tablet 15 mg  15 mg Oral Q supper Mayo, Pete Pelt, MD   15 mg at 10/03/18 1833  . sacubitril-valsartan (ENTRESTO) 24-26 mg per tablet  1 tablet Oral BID Loletha Grayer, MD   1 tablet at 10/04/18 1010  . senna-docusate (Senokot-S) tablet 1 tablet  1 tablet Oral QHS PRN Mayo, Pete Pelt, MD      . traMADol Veatrice Bourbon) tablet 50 mg  50 mg Oral Q6H PRN Mayo, Pete Pelt, MD   50 mg at 10/02/18 1531   Past Medical History:  Diagnosis Date  . Anemia   . Breast cancer ([place]) 2015   a. L mastectomy with chemoradiation  . 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 COPD (chronic obstructive pulmonary disease) ([place])   . Depression   . DJD (degenerative joint disease) of knee    right knee  . DVT  (deep venous thrombosis) ([place])    a. 06/2018 L popliteal and R peroneal DVTs-->Xarelto.  . H/O hysterectomy with oophorectomy    for DUB  . HFrEF (heart failure with reduced ejection fraction) ([place])    a. 06/2018 Echo: EF 25-30%.  . History of depression   . HLD (hyperlipidemia)   . HTN (hypertension)   . LV (left ventricular) mural thrombus    history of, resolved (echo 04/09)  . NICM (nonischemic cardiomyopathy) ([place])    a. Reports prior h/o cath @ [facility]; b. status post [place] with [employer] lead in 2009 at [facility], Dr. Boyd Kerbs; c. 2014 - prev seen by G. Lovena Le, MD; c. 06/2018 Echo: EF 25-30%, DD. Diff HK. RVSP 39mmHg. Mildly dil RA. Mild to mod MR. Mod TR. Mild AI.  Marland Kitchen Persistent atrial fibrillation    a. noted in Care Everywhere from 2008-2009; b. CHADS2VASc 6 (CHF, HTN, age x 2, vascular disease, female)-->eliquis added 06/2018 in setting of LLE DVT.  . Valvular heart disease    a. 06/2018 Echo: Mod TR, mild to mod MR, mild AI.   Past Surgical History:  Procedure Laterality Date  . ABDOMINAL HYSTERECTOMY    . BREAST BIOPSY Left 05/10/13   [place] bx-positive  . COLONOSCOPY WITH PROPOFOL N/A 01/15/2017   Procedure: COLONOSCOPY WITH PROPOFOL;  Surgeon: Jonathon Bellows, MD;  Location: [facility] ENDOSCOPY;  Service: Gastroenterology;  Laterality: N/A;  . LAPAROSCOPIC RIGHT COLON RESECTION  08/10   Dr Pat Patrick, for adenoca   . MASTECTOMY Left 2015   BREAST CA  . OOPHORECTOMY    . ORIF FEMUR FRACTURE Right 07/21/2017   Procedure: OPEN REDUCTION INTERNAL FIXATION (ORIF) DISTAL FEMUR FRACTURE;  Surgeon: Lovell Sheehan, MD;  Location: [facility] ORS;  Service: Orthopedics;  Laterality: Right;  femur   . PACEMAKER INSERTION  2009  . REPLACEMENT TOTAL KNEE  1990's   right    Social History Social History   Tobacco Use  . Smoking status: Former Research scientist (life sciences)  . Smokeless tobacco: Never Used  . Tobacco comment: quit 2006  Substance Use Topics  . Alcohol use: No    Comment: former  . Drug use: No   Family  History Family History  Problem Relation Age of Onset  . Hypertension Father   . Alzheimer's disease Mother   . Breast cancer Sister 54  . Diabetes Other        brothers  . Stroke Brother   . Heart failure Brother   . Lupus Sister   Denies family history of peripheral artery disease, venous disease or  renal disease.  Allergies  Allergen Reactions  . No Known Allergies    REVIEW OF SYSTEMS (Negative unless checked)  Constitutional: [] Weight loss  [] Fever  [] Chills Cardiac: [] Chest pain   [] Chest pressure   [] Palpitations   [] Shortness of breath when laying flat   [] Shortness of breath at rest   [] Shortness of breath with exertion. Vascular:  [x] Pain in legs with walking   [x] Pain in legs at rest   [x] Pain in legs when laying flat   [] Claudication   [] Pain in feet when walking  [] Pain in feet at rest  [] Pain in feet when laying flat   [x] History of DVT   [] Phlebitis   [x] Swelling in legs   [] Varicose veins   [] Non-healing ulcers Pulmonary:   [] Uses home oxygen   [] Productive cough   [] Hemoptysis   [] Wheeze  [] COPD   [] Asthma Neurologic:  [] Dizziness  [] Blackouts   [] Seizures   [] History of stroke   [] History of TIA  [] Aphasia   [] Temporary blindness   [] Dysphagia   [] Weakness or numbness in arms   [] Weakness or numbness in legs Musculoskeletal:  [] Arthritis   [] Joint swelling   [] Joint pain   [] Low back pain Hematologic:  [] Easy bruising  [] Easy bleeding   [] Hypercoagulable state   [] Anemic  [] Hepatitis Gastrointestinal:  [] Blood in stool   [] Vomiting blood  [] Gastroesophageal reflux/heartburn   [] Difficulty swallowing. Genitourinary:  [] Chronic kidney disease   [] Difficult urination  [] Frequent urination  [] Burning with urination   [] Blood in urine Skin:  [] Rashes   [] Ulcers   [] Wounds Psychological:  [] History of anxiety   []  History of major depression.  Physical Examination  Vitals:   10/04/18 0046 10/04/18 0551 10/04/18 0830 10/04/18 1245  BP: 119/83 (!) 124/92 119/80 106/79   Pulse: 90 90 91 61  Resp:  16 18 18   Temp:  98 F (36.7 C) 98.6 F (37 C) 98 F (36.7 C)  TempSrc:  Oral Oral Oral  SpO2: 100% 100% 100% 98%  Weight:      Height:       Body mass index is 32.3 kg/m. Gen:  WD/WN, NAD Head: [place]/AT, No temporalis wasting. Prominent temp pulse not noted. Ear/Nose/Throat: Hearing grossly intact, nares w/o erythema or drainage, oropharynx w/o Erythema/Exudate Eyes: Sclera non-icteric, conjunctiva clear Neck: Trachea midline.  No JVD.  Pulmonary:  Good air movement, respirations not labored, equal bilaterally.  Cardiac: Irregularly irregular Vascular:  Vessel Right Left  Radial Palpable Palpable  Ulnar Palpable Palpable  Brachial Palpable Palpable  Carotid Palpable, without bruit Palpable, without bruit  Aorta Not palpable N/A  Femoral Palpable Palpable  Popliteal Palpable Palpable  PT Non-Palpable Non-Palpable  DP Non-Palpable Non-Palpable   Left Lower Extremity: Thigh soft.  Calf soft.  Scattered ulcerations the largest being to the Achilles tendon area.  Stasis dermatitis from ankle to knee.  Mild edema.  Right lower extremity: Thigh soft.  Calf soft.  Very small scattered ulcerations.  Stasis dermatitis from ankle to knee.  Mild edema.  Gastrointestinal: soft, non-tender/non-distended. No guarding/reflex.  Musculoskeletal: M/S 5/5 throughout.  Neurologic: Sensation grossly intact in extremities.  Symmetrical.  Speech is fluent. Motor exam as listed above. Psychiatric: Judgment intact, Mood & affect appropriate for pt's clinical situation. Dermatologic: As above Lymph : No Cervical, Axillary, or Inguinal lymphadenopathy.  CBC Lab Results  Component Value Date   WBC 8.1 10/03/2018   HGB 13.6 10/03/2018   HCT 40.9 10/03/2018   MCV 91.7 10/03/2018   PLT 158 10/03/2018  BMET    Component Value Date/Time   NA 141 10/03/2018 0444   NA 143 09/02/2018 1229   NA 136 12/13/2013 0623   K 3.1 (L) 10/03/2018 0444   K 3.8 12/13/2013 0623    CL 104 10/03/2018 0444   CL 104 12/13/2013 0623   CO2 24 10/03/2018 0444   CO2 28 12/13/2013 0623   GLUCOSE 99 10/03/2018 0444   GLUCOSE 100 (H) 12/13/2013 0623   BUN 32 (H) 10/03/2018 0444   BUN 38 (H) 09/02/2018 1229   BUN 16 12/13/2013 0623   CREATININE 1.28 (H) 10/03/2018 0444   CREATININE 1.01 12/13/2013 0623   CALCIUM 8.6 (L) 10/03/2018 0444   CALCIUM 9.7 12/13/2013 0623   GFRNONAA 39 (L) 10/03/2018 0444   GFRNONAA 56 (L) 12/13/2013 0623   GFRNONAA 58 (L) 09/27/2013 1332   GFRAA 45 (L) 10/03/2018 0444   GFRAA >60 12/13/2013 0623   GFRAA >60 09/27/2013 1332   Estimated Creatinine Clearance: 33.2 mL/min (A) (by C-G formula based on SCr of 1.28 mg/dL (H)).  COAG Lab Results  Component Value Date   INR 1.0 06/27/2018   Radiology Ct Head Wo Contrast  Result Date: 10/01/2018 CLINICAL DATA:  Altered mental status.  Patient on Xarelto. EXAM: CT HEAD WITHOUT CONTRAST TECHNIQUE: Contiguous axial images were obtained from the base of the skull through the vertex without intravenous contrast. COMPARISON:  October 04, 2006 FINDINGS: Brain: No subdural, epidural, or subarachnoid hemorrhage identified. Ventricles and sulci are prominent but stable. Cerebellum, brainstem, and basal cisterns are within normal limits. No mass effect or midline shift. White matter changes are identified, particularly in the left frontal lobe such as on axial image 18. There appears to be mild involvement over the overlying cortex suggesting a small left frontal infarct. No other evidence of acute ischemia or infarct identified. Vascular: Calcified atherosclerosis is seen in the intracranial carotids. Skull: Normal. Negative for fracture or focal lesion. Sinuses/Orbits: No acute finding. Other: None. IMPRESSION: 1. Focal white matter changes are seen in the left frontal lobe with a small region of overlying cortical involvement consistent with infarct. These findings are age indeterminate but favored to be nonacute  based on appearance. Recommend clinical correlation 2. No other acute abnormalities are identified. Electronically Signed   By: Dorise Bullion III M.D   On: 10/01/2018 16:42   US Carotid Bilateral (at [facility] Only)  Result Date: 10/02/2018 CLINICAL DATA:  84 year old female with a history of stroke EXAM: BILATERAL CAROTID DUPLEX ULTRASOUND TECHNIQUE: Pearline Cables scale imaging, color Doppler and duplex ultrasound were performed of bilateral carotid and vertebral arteries in the neck. COMPARISON:  None. FINDINGS: Criteria: Quantification of carotid stenosis is based on velocity parameters that correlate the residual internal carotid diameter with NASCET-based stenosis levels, using the diameter of the distal internal carotid lumen as the denominator for stenosis measurement. The following velocity measurements were obtained: RIGHT ICA:  Systolic 41 cm/sec, Diastolic 19 cm/sec CCA:  31 cm/sec SYSTOLIC ICA/CCA RATIO:  1.3 ECA:  32 cm/sec LEFT ICA:  Systolic 66 cm/sec, Diastolic 13 cm/sec CCA:  63 cm/sec SYSTOLIC ICA/CCA RATIO:  1.0 ECA:  59 cm/sec Right Brachial SBP: Not acquired Left Brachial SBP: Not acquired RIGHT CAROTID ARTERY: Calcification of the right common carotid artery. Intermediate waveform maintained. Heterogeneous and partially calcified plaque at the right carotid bifurcation. No significant lumen shadowing. Low resistance waveform of the right ICA. No significant tortuosity. RIGHT VERTEBRAL ARTERY: Antegrade flow with low resistance waveform. LEFT CAROTID ARTERY: Calcifications  of the left common carotid artery. Intermediate waveform maintained. Heterogeneous and partially calcified plaque at the left carotid bifurcation without significant lumen shadowing. Low resistance waveform of the left ICA. No significant tortuosity. LEFT VERTEBRAL ARTERY:  Antegrade flow with low resistance waveform. IMPRESSION: Color duplex indicates moderate heterogeneous and calcified plaque, with no hemodynamically  significant stenosis by duplex criteria in the extracranial cerebrovascular circulation. Signed, Dulcy Fanny. Dellia Nims, RPVI [facility] Radiology Electronically Signed   By: Corrie Mckusick D.O.   On: 10/02/2018 11:51   US Arterial Lower Extremity Duplex Bilateral  Result Date: 09/29/2018 CLINICAL DATA:  84 year old female with bilateral lower extremity ulcers for 1 month. Evaluate for underlying peripheral arterial disease. EXAM: BILATERAL LOWER EXTREMITY ARTERIAL DUPLEX SCAN TECHNIQUE: Gray-scale sonography as well as color Doppler and duplex ultrasound was performed to evaluate the arteries of both lower extremities including the common, superficial and profunda femoral arteries, popliteal artery and calf arteries. COMPARISON:  None. FINDINGS: Right Lower Extremity Inflow: Normal common femoral arterial waveforms and velocities. No evidence of inflow (aortoiliac) disease. Outflow: Normal profunda femoral, superficial femoral and popliteal arterial waveforms and velocities. No focal elevation of the PSV to suggest stenosis. Runoff: Normal posterior and anterior tibial arterial waveforms and velocities. Vessels are patent to the ankle. Left Lower Extremity Inflow: Normal common femoral arterial waveforms and velocities. No evidence of inflow (aortoiliac) disease. Outflow: Normal profunda femoral, superficial femoral and popliteal arterial waveforms and velocities. No focal elevation of the PSV to suggest stenosis. Runoff: Normal posterior and anterior tibial arterial waveforms and velocities. Vessels are patent to the ankle. IMPRESSION: Minimal heterogeneous atherosclerotic plaque bilaterally without evidence of hemodynamically significant stenosis or occlusion. Signed, Criselda Peaches, MD, [place] [facility] Radiology Electronically Signed   By: Jacqulynn Cadet M.D.   On: 09/29/2018 10:04   Dg Chest Port 1  View  Result Date: 10/01/2018 CLINICAL DATA:  Sepsis. EXAM: PORTABLE CHEST 1 VIEW COMPARISON:  09/25/2018 FINDINGS: The heart is enlarged but stable. There is moderate tortuosity, ectasia and calcification of the thoracic aorta. The pacer wires/AICD is stable. The right IJ power port is stable. The lungs are clear of an acute process. Minimal right basilar scarring or atelectasis. The bony thorax is intact. IMPRESSION: 1. Stable cardiac enlargement and tortuous ectatic calcified thoracic aorta. 2. Streaky right basilar scarring or atelectasis but no infiltrates or effusions. Electronically Signed   By: Marijo Sanes M.D.   On: 10/01/2018 16:36   Dg Chest Portable 1 View  Result Date: 09/25/2018 CLINICAL DATA:  Shortness of breath EXAM: PORTABLE CHEST 1 VIEW COMPARISON:  06/27/2018 FINDINGS: Right-sided chest port and left implanted cardiac device remain in place. Stable mild cardiomegaly. Calcified thoracic aorta. No focal airspace consolidation. No pleural effusion or pneumothorax. IMPRESSION: Stable mild cardiomegaly without acute cardiopulmonary process. Electronically Signed   By: Davina Poke M.D.   On: 09/25/2018 16:15   Assessment/Plan The patient is an 85 year old female with multiple medical issues (see below) who presented to the [facility] emergency department on October 01, 2018 via EMS from the [place] for "infected wounds to the legs". 1. Lower Extremity Wounds: Patient with bilateral lower extremity ulcerations left lower extremity > right lower extremity.  Most likely from possible peripheral artery disease, venous insufficiency, lymphedema, congestive heart failure.  We will start with a right lower extremity angiogram with possible intervention as this extremity is more painful /wounds are  worse to assess the patient's anatomy and contributing degree of peripheral artery disease.  If appropriate an attempt to  revascularize the leg can be made at that time.  Would also like to angiogram the right lower extremity however this can be done as an outpatient.  The patient would also benefit from a venous / lymphedema work-up in our outpatient clinic.  Would recommend Unna wraps for both compression/wound care.  Patient may also benefit from a lymphedema pump.  Procedure, risks and benefits explained to patient.  All questions answered.  Patient wishes to proceed.  I am available if family has questions. 2. Hyperlipidemia: On Lipitor.  Will consider the addition of aspirin for medical management. Encouraged good control as its slows the progression of atherosclerotic disease. 3. DVT: On Xarelto.  Would anticoagulate for at least 6 months from the time of diagnosis.  Would be happy to follow the patient in our outpatient clinic for DVT management as well.  Discussed with Dr. Mayme Genta, PA-C  10/04/2018 3:15 PM  This note was created with Dragon medical transcription system.  Any error is purely unintentional

## 2018-10-04 NOTE — Progress Notes (Addendum)
Patient ID: Julia Gordon, female   DOB: 12/23/1934, 84 y.o.   MRN: [id number]  [facility] PROGRESS NOTE  Julia Gordon [id number] DOB: April 26, 1934 DOA: 10/01/2018 PCP: Ellamae Sia, MD  HPI/Subjective: Patient was seen this morning and was lethargic.  Went back to sleep easily.  Patient was given Seroquel last night.  Objective: Vitals:   10/04/18 0551 10/04/18 0830  BP: (!) 124/92 119/80  Pulse: 90 91  Resp: 16 18  Temp: 98 F (36.7 C) 98.6 F (37 C)  SpO2: 100% 100%    Intake/Output Summary (Last 24 hours) at 10/04/2018 1201 Last data filed at 10/04/2018 0554 Gross per 24 hour  Intake 240 ml  Output 400 ml  Net -160 ml   Filed Weights   10/01/18 1540 10/01/18 2100  Weight: 79.2 kg 80.1 kg    ROS: Review of Systems  Unable to perform ROS: Acuity of condition   Exam: Physical Exam  Constitutional: She appears lethargic.  HENT:  Nose: No mucosal edema.  Mouth/Throat: No oropharyngeal exudate or posterior oropharyngeal edema.  Eyes: Pupils are equal, round, and reactive to light. Conjunctivae, EOM and lids are normal.  Neck: No JVD present. Carotid bruit is not present. No edema present. No thyroid mass and no thyromegaly present.  Cardiovascular: S1 normal and S2 normal. Exam reveals no gallop.  No murmur heard. Respiratory: No respiratory distress. She has no wheezes. She has no rhonchi. She has no rales.  GI: Soft. Bowel sounds are normal. There is no abdominal tenderness.  Musculoskeletal:     Right ankle: She exhibits swelling.     Left ankle: She exhibits swelling.  Lymphadenopathy:    She has no cervical adenopathy.  Neurological: She appears lethargic.  Skin: Skin is warm. Nails show no clubbing.  Numerous ulcers bilateral lower extremities.  The patient's most painful ulcer is on the left posterior leg.  Chronic lower extremity skin discoloration and slight warmth.  Psychiatric:  Lethargic this morning      Data Reviewed: Basic Metabolic  Panel: Recent Labs  Lab 10/01/18 1554 10/03/18 0444  NA 140 141  K 3.5 3.1*  CL 105 104  CO2 25 24  GLUCOSE 95 99  BUN 35* 32*  CREATININE 1.29* 1.28*  CALCIUM 8.7* 8.6*   Liver Function Tests: Recent Labs  Lab 10/01/18 1554  AST 28  ALT 23  ALKPHOS 49  BILITOT 0.9  PROT 5.9*  ALBUMIN 2.9*    Recent Labs  Lab 10/01/18 2313  AMMONIA 27   CBC: Recent Labs  Lab 10/01/18 1554 10/03/18 0444  WBC 6.6 8.1  NEUTROABS 4.7  --   HGB 13.5 13.6  HCT 41.3 40.9  MCV 92.6 91.7  PLT 166 158  BNP (last 3 results) Recent Labs    06/27/18 1931 09/25/18 1831  BNP 2,183.0* 1,771.0*    CBG: Recent Labs  Lab 10/01/18 1608  GLUCAP 73    Recent Results (from the past 240 hour(s))  Blood Culture (routine x 2)     Status: None (Preliminary result)   Collection Time: 10/01/18  3:54 PM   Specimen: BLOOD  Result Value Ref Range Status   Specimen Description BLOOD L EJ  Final   Special Requests   Final    BOTTLES DRAWN AEROBIC AND ANAEROBIC Blood Culture results may not be optimal due to an excessive volume of blood received in culture bottles   Culture   Final    NO GROWTH 3 DAYS Performed at [facility], [address] [place], [place] [address]    Report Status PENDING  Incomplete  SARS Coronavirus 2 [facility] order, Performed in [facility] hospital lab) Nasopharyngeal     Status: None   Collection Time: 10/01/18  5:29 PM   Specimen: Nasopharyngeal  Result Value Ref Range Status   SARS Coronavirus 2 NEGATIVE NEGATIVE Final    Comment: (NOTE) If result is NEGATIVE SARS-CoV-2 target nucleic acids are NOT DETECTED. The SARS-CoV-2 RNA is generally detectable in upper and lower  respiratory specimens during the acute phase of infection. The lowest  concentration of SARS-CoV-2 viral copies this assay can detect is 250  copies / mL. A negative result does not preclude SARS-CoV-2 infection  and should not be used as the sole basis for treatment or other   patient management decisions.  A negative result may occur with  improper specimen collection / handling, submission of specimen other  than nasopharyngeal swab, presence of viral mutation(s) within the  areas targeted by this assay, and inadequate number of viral copies  (<250 copies / mL). A negative result must be combined with clinical  observations, patient history, and epidemiological information. If result is POSITIVE SARS-CoV-2 target nucleic acids are DETECTED. The SARS-CoV-2 RNA is generally detectable in upper and lower  respiratory specimens dur ing the acute phase of infection.  Positive  results are indicative of active infection with SARS-CoV-2.  Clinical  correlation with patient history and other diagnostic information is  necessary to determine patient infection status.  Positive results do  not rule out bacterial infection or co-infection with other viruses. If result is PRESUMPTIVE POSTIVE SARS-CoV-2 nucleic acids MAY BE PRESENT.   A presumptive positive result was obtained on the submitted specimen  and confirmed on repeat testing.  While 2019 novel coronavirus  (SARS-CoV-2) nucleic acids may be present in the submitted sample  additional confirmatory testing may be necessary for epidemiological  and / or clinical management purposes  to differentiate between  SARS-CoV-2 and other Sarbecovirus currently known to infect humans.  If clinically indicated additional testing with an alternate test  methodology [phone]) is advised. The SARS-CoV-2 RNA is generally  detectable in upper and lower respiratory sp ecimens during the acute  phase of infection. The expected result is Negative. Fact Sheet for Patients:  StrictlyIdeas.no Fact Sheet for Healthcare Providers: BankingDealers.co.za This test is not yet approved or cleared by the [place] FDA and has been authorized for detection and/or diagnosis of SARS-CoV-2  by FDA under an Emergency Use Authorization (EUA).  This EUA will remain in effect (meaning this test can be used) for the duration of the COVID-19 declaration under Section 564(b)(1) of the Act, 21 U.S.C. section 360bbb-3(b)(1), unless the authorization is terminated or revoked sooner. Performed at [facility], [address], [place], [place] [address]      Studies: No results found.  Scheduled Meds: . atorvastatin  40 mg Oral q1800  . Chlorhexidine Gluconate Cloth  6 each Topical Daily  . furosemide  40 mg Oral BID  . mouth rinse  15 mL Mouth Rinse BID  . metoprolol succinate  12.5 mg Oral Daily  . QUEtiapine  12.5 mg Oral QHS  . Rivaroxaban  15 mg Oral Q supper  . sacubitril-valsartan  1 tablet Oral BID   Continuous Infusions: .  ceFAZolin (ANCEF) IV 1 g (10/04/18 [id number])    Assessment/Plan:  1. Stroke left frontal lobe age-indeterminate.  Carotid ultrasound negative..  Echocardiogram shows an ejection  fraction less than 20%..  Patient given aspirin on admission.  On Xarelto.  Neurology consult appreciated.  Physical therapy recommended rehab.  Patient would like to go home though. 2. Acute metabolic encephalopathy.  Patient lethargic this morning after given Seroquel last night.  Will discontinue Seroquel.  Mental status was better yesterday than this morning. 3. Bilateral lower extremity weeping wounds.  Continue Lasix.  Continue Ancef.  Wound care consult pending.  May end up needing Unna boots.  Follows up with the wound care center as outpatient.  Will consult vascular surgery. 4. Chronic systolic congestive heart failure.  Continue Lasix for the weeping edema.  Restart low-dose Toprol-XL 12.5 mg twice a day.  Low-dose Entresto. 5. Right lower extremity DVT on Xarelto. 6. Chronic atrial fibrillation on Xarelto and metoprolol. 7. Chronic kidney disease stage III. 8. Hyperlipidemia lovastatin switched to Lipitor.  Code Status:     Code Status Orders  (From  admission, onward)         Start     Ordered   10/01/18 2142  Full code  Continuous     10/01/18 2141        Code Status History    Date Active Date Inactive Code Status Order ID Comments User Context   06/27/2018 2303 06/30/2018 1504 Full Code [id number]  Mayer Camel, NP ED   07/20/2017 0231 07/23/2017 1926 Full Code [id number]  Lance Coon, MD Inpatient   Advance Care Planning Activity     Family Communication: Spoke with daughter on the phone while at the bedside. Disposition Plan: Patient would like to go home.  Would like to see better effort with physical therapy prior to making this decision.  Antibiotics:  Ancef  Time spent: 27 minutes  [place]

## 2018-10-04 NOTE — Progress Notes (Signed)
Occupational Therapy Treatment Patient Details Name: Julia Gordon MRN: [id number] DOB: November 05, 1934 Today's Date: 10/04/2018    History of present illness Per neurologist assessment: Julia Gordon is an 84 y.o. female with a history of atrial fibrillation on Xarelto who presented lethargic.  Currently appears at baseline.  Alert.  Head CT reviewed and shows areas of hypodensity likely secondary to small vessel ischemic changes and chronic ischemic changes.  Patient unable to have MRI but on anticoagulation.  Carotid dopplers show no evidence of hemodynamically significant stenosis.  MD assessment also includes: Acute metabolic encephalopathy, BLE weeping wounds, chronic CHF, Right lower extremity DVT on Xarelto, chronic a-fib, CKD III, and HLD.   OT comments  Julia Gordon was seen for OT treatment on this date. Upon arrival to room Julia Gordon awake/alert seated upright in room recliner with legs resting on disposable chuck pads. Julia Gordon A&O x 4 reporting no pain, however Julia Gordon does endorse pain during session with BLE movement. Julia Gordon agreeable to OT tx. Julia Gordon required set-up/supervision for seated grooming and min assist for UB dressing tasks this date (see ADL section below for further detail). Julia Gordon tolerated the treatment session well and is making good progress toward OT goals. Julia Gordon continues to benefit from skilled OT services to maximize return to PLOF and minimize risk of future falls, injury, caregiver burden, and readmission. Will continue to follow POC as written. Discharge recommendation remains appropriate.    Follow Up Recommendations  SNF    Equipment Recommendations  3 in 1 bedside commode    Recommendations for Other Services      Precautions / Restrictions Precautions Precautions: Fall Precaution Comments: High Fall Restrictions Weight Bearing Restrictions: No       Mobility Bed Mobility               General bed mobility comments: Deferred. Julia Gordon up in recliner at start/end of  session.  Transfers Overall transfer level: Needs assistance Equipment used: Rolling walker (2 wheeled) Transfers: Sit to/from Stand Sit to Stand: +2 physical assistance;Mod assist              Balance Overall balance assessment: Needs assistance Sitting-balance support: Feet supported;No upper extremity supported Sitting balance-Leahy Scale: Good Sitting balance - Comments: Steady sitting, reaching within BOS. Demonstrates good static and dynamic sitting balance during functional tasks.                                   ADL either performed or assessed with clinical judgement   ADL       Grooming: Wash/dry face;Oral care;Sitting;Set up;Supervision/safety Grooming Details (indicate cue type and reason): Julia Gordon able to complete grooming tasks above with set-up assist from therapist this date. She continues to be limited by BLE pain and open wounds which prevent her from standing to complete ADLs at this time. Julia Gordon demonstrated good overall UE use and sequencing of tasks with no cueing or re-direction from therapist provided during grooming on this date.         Upper Body Dressing : Sitting;Set up;Minimal assistance Upper Body Dressing Details (indicate cue type and reason): Min A to thread arms through hospital gown this date. Suspect Julia Gordon would be more independent with a familiar garment.                         Vision Baseline Vision/History: Wears glasses Wears Glasses: At all times Patient Visual Report:  No change from baseline     Perception     Praxis      Cognition Arousal/Alertness: Awake/alert Behavior During Therapy: WFL for tasks assessed/performed Overall Cognitive Status: Within Functional Limits for tasks assessed                                 General Comments: Julia Gordon orientation improved this date. She is A&Ox4 which is improved since OT evaluation. She consistently follows 1-step commands and is agreeable throughout the  session.        Exercises Other Exercises Other Exercises: Julia Gordon assisted with grooming and UB dressing while sitting in room recliner. OT provided set-up/supervision throughout session. Julia Gordon tolerated well and demonstrates good overall sitting balance during functional activities this date.   Shoulder Instructions       General Comments      Pertinent Vitals/ Pain       Pain Assessment: Faces Faces Pain Scale: Hurts little more Pain Location: BLE with movement Pain Descriptors / Indicators: Grimacing;Guarding Pain Intervention(s): Limited activity within patient's tolerance;Monitored during session;Repositioned  Home Living                                          Prior Functioning/Environment              Frequency  Min 2X/week        Progress Toward Goals  OT Goals(current goals can now be found in the care plan section)  Progress towards OT goals: Progressing toward goals  Acute Rehab OT Goals Patient Stated Goal: To go home OT Goal Formulation: With patient/family Time For Goal Achievement: 10/16/18 Potential to Achieve Goals: Good  Plan Discharge plan remains appropriate;Frequency remains appropriate    Co-evaluation                 AM-PAC OT "6 Clicks" Daily Activity     Outcome Measure   Help from another person eating meals?: A Little Help from another person taking care of personal grooming?: A Little Help from another person toileting, which includes using toliet, bedpan, or urinal?: A Lot Help from another person bathing (including washing, rinsing, drying)?: A Lot Help from another person to put on and taking off regular upper body clothing?: A Little Help from another person to put on and taking off regular lower body clothing?: A Lot 6 Click Score: 15    End of Session    OT Visit Diagnosis: Other abnormalities of gait and mobility (R26.89);Muscle weakness (generalized) (M62.81)   Activity Tolerance Patient  tolerated treatment well   Patient Left in bed;with call bell/phone within reach;with bed alarm set   Nurse Communication          Time: [id number] OT Time Calculation (min): 19 min  Charges: OT General Charges $OT Visit: 1 Visit OT Treatments $Self Care/Home Management : 8-22 mins  Shara Blazing, M.S., OTR/L Ascom: [phone] 10/04/18, 4:12 PM

## 2018-10-04 NOTE — Progress Notes (Signed)
Subjective: Mentation improved and she is following commands  Date last known well: Date: 09/30/2018 Time last known well: Unable to determine tPA Given: No: Unable to determine LKW  Past Medical History:  Diagnosis Date  . Anemia   . Breast cancer ([place]) 2015   a. L mastectomy with chemoradiation  . Colon adenocarcinoma ([place])   . COPD (chronic obstructive pulmonary disease) ([place])   . Depression   . DJD (degenerative joint disease) of knee    right knee  . DVT (deep venous thrombosis) ([place])    a. 06/2018 L popliteal and R peroneal DVTs-->Xarelto.  . H/O hysterectomy with oophorectomy    for DUB  . HFrEF (heart failure with reduced ejection fraction) ([place])    a. 06/2018 Echo: EF 25-30%.  . History of depression   . HLD (hyperlipidemia)   . HTN (hypertension)   . LV (left ventricular) mural thrombus    history of, resolved (echo 04/09)  . NICM (nonischemic cardiomyopathy) ([place])    a. Reports prior h/o cath @ [facility]; b. status post [place] with [employer] lead in 2009 at [facility], Dr. Boyd Kerbs; c. 2014 - prev seen by G. Lovena Le, MD; c. 06/2018 Echo: EF 25-30%, DD. Diff HK. RVSP 12mmHg. Mildly dil RA. Mild to mod MR. Mod TR. Mild AI.  Marland Kitchen Persistent atrial fibrillation    a. noted in Care Everywhere from 2008-2009; b. CHADS2VASc 6 (CHF, HTN, age x 2, vascular disease, female)-->eliquis added 06/2018 in setting of LLE DVT.  . Valvular heart disease    a. 06/2018 Echo: Mod TR, mild to mod MR, mild AI.    Past Surgical History:  Procedure Laterality Date  . ABDOMINAL HYSTERECTOMY    . BREAST BIOPSY Left 05/10/13   [place] bx-positive  . COLONOSCOPY WITH PROPOFOL N/A 01/15/2017   Procedure: COLONOSCOPY WITH PROPOFOL;  Surgeon: Jonathon Bellows, MD;  Location: [facility] ENDOSCOPY;  Service: Gastroenterology;  Laterality: N/A;  . LAPAROSCOPIC RIGHT COLON RESECTION  08/10   Dr Pat Patrick, for adenoca   . MASTECTOMY Left 2015   BREAST CA  . OOPHORECTOMY    . ORIF FEMUR FRACTURE Right 07/21/2017   Procedure: OPEN  REDUCTION INTERNAL FIXATION (ORIF) DISTAL FEMUR FRACTURE;  Surgeon: Lovell Sheehan, MD;  Location: [facility] ORS;  Service: Orthopedics;  Laterality: Right;  femur   . PACEMAKER INSERTION  2009  . REPLACEMENT TOTAL KNEE  1990's   right     Family History  Problem Relation Age of Onset  . Hypertension Father   . Alzheimer's disease Mother   . Breast cancer Sister 1  . Diabetes Other        brothers  . Stroke Brother   . Heart failure Brother   . Lupus Sister    Social History:  reports that she has quit smoking. She has never used smokeless tobacco. She reports that she does not drink alcohol or use drugs.  Allergies:  Allergies  Allergen Reactions  . No Known Allergies     Medications:  I have reviewed the patient's current medications. Prior to Admission:  Medications Prior to Admission  Medication Sig Dispense Refill Last Dose  . alendronate (FOSAMAX) 70 MG tablet TAKE 1 TAB WEEKLY 30 MINS BEFORE BREAKFAST WITH A FULL GLASS OF WATER. DO NOT LIE DOWN FOR 30 MINS 12 tablet 3 Past Week at Unknown time  . furosemide (LASIX) 20 MG tablet Take 40 mg (2 tablets) by mouth every morning, take 1 tablet (20 mg total) once in the afternoon.  90 tablet 6 unknown at unknown  . lovastatin (MEVACOR) 40 MG tablet Take 2 tablets by mouth Daily.   unknwon at [employer]  . metoprolol succinate (TOPROL-XL) 50 MG 24 hr tablet Take 1 tablet (50 mg total) by mouth daily. 30 tablet 6 unknown at unknown  . potassium chloride SA (K-DUR) 20 MEQ tablet Take 1 tablet (20 mEq total) by mouth daily. 90 tablet 3 unknown at [facility]  . rivaroxaban (XARELTO) 15 MG TABS tablet Take 1 tablet (15 mg total) by mouth daily with supper. 30 tablet 6 unknown at [facility]  . traMADol (ULTRAM) 50 MG tablet Take 1 tablet (50 mg total) by mouth every 6 (six) hours as needed. 15 tablet 0 prn at prn   Scheduled: . atorvastatin  40 mg Oral q1800  . Chlorhexidine Gluconate Cloth  6 each Topical Daily  . furosemide  40 mg Oral BID   . mouth rinse  15 mL Mouth Rinse BID  . metoprolol succinate  12.5 mg Oral Daily  . QUEtiapine  12.5 mg Oral QHS  . Rivaroxaban  15 mg Oral Q supper  . sacubitril-valsartan  1 tablet Oral BID    ROS: History obtained from the patient  General ROS: negative for - chills, fatigue, fever, night sweats, weight gain or weight loss Psychological ROS: negative for - behavioral disorder, hallucinations, memory difficulties, mood swings or suicidal ideation Ophthalmic ROS: negative for - blurry vision, double vision, eye pain or loss of vision ENT ROS: negative for - epistaxis, nasal discharge, oral lesions, sore throat, tinnitus or vertigo Allergy and Immunology ROS: negative for - hives or itchy/watery eyes Hematological and Lymphatic ROS: negative for - bleeding problems, bruising or swollen lymph nodes Endocrine ROS: negative for - galactorrhea, hair pattern changes, polydipsia/polyuria or temperature intolerance Respiratory ROS: negative for - cough, hemoptysis, shortness of breath or wheezing Cardiovascular ROS: LE edema Gastrointestinal ROS: negative for - abdominal pain, diarrhea, hematemesis, nausea/vomiting or stool incontinence Genito-Urinary ROS: negative for - dysuria, hematuria, incontinence or urinary frequency/urgency Musculoskeletal ROS: right knee pain Neurological ROS: as noted in HPI Dermatological ROS: multiple LE skin lesion  Physical Examination: Blood pressure 119/80, pulse 91, temperature 98.6 F (37 C), temperature source Oral, resp. rate 18, height 5\' 2"  (1.575 m), weight 80.1 kg, SpO2 100 %.  HEENT-  Normocephalic, no lesions, without obvious abnormality.  Normal external eye and conjunctiva.  Normal TM's bilaterally.  Normal auditory canals and external ears. Normal external nose, mucus membranes and septum.  Normal pharynx. Cardiovascular- S1, S2 normal, pulses palpable throughout   Lungs- chest clear, no wheezing, rales, normal symmetric air entry Abdomen-  soft, non-tender; bowel sounds normal; no masses,  no organomegaly Extremities- LE edema Lymph-no adenopathy palpable Musculoskeletal-right knee swelling Skin-LE's erythematous with some open and weeping lesions noted.    Neurological Examination   Mental Status: Alert, oriented to name place and month.  Speech fluent without evidence of aphasia.  Able to follow 3 step commands without difficulty. Cranial Nerves: II: Discs flat bilaterally; Visual fields grossly normal, pupils equal, round, reactive to light and accommodation III,IV, VI: ptosis not present, extra-ocular motions intact bilaterally V,VII: smile symmetric, facial light touch sensation normal bilaterally VIII: hearing normal bilaterally IX,X: gag reflex present XI: bilateral shoulder shrug XII: midline tongue extension Motor: Patient able to lift al extremities against gravity.  No drift noted in the upper extremities.  Generalized weakness in the lower extremities.   Sensory: Pinprick and light touch intact throughout, bilaterally Deep Tendon Reflexes: Symmetric  throughout Plantars: Right: mute   Left: mute Cerebellar: Normal finger-to-nose testing bilaterally Gait: not tested due to safety concerns  Laboratory Studies:  Basic Metabolic Panel: Recent Labs  Lab 10/01/18 1554 10/03/18 0444  NA 140 141  K 3.5 3.1*  CL 105 104  CO2 25 24  GLUCOSE 95 99  BUN 35* 32*  CREATININE 1.29* 1.28*  CALCIUM 8.7* 8.6*    Liver Function Tests: Recent Labs  Lab 10/01/18 1554  AST 28  ALT 23  ALKPHOS 49  BILITOT 0.9  PROT 5.9*  ALBUMIN 2.9*   No results for input(s): LIPASE, AMYLASE in the last 168 hours. Recent Labs  Lab 10/01/18 2313  AMMONIA 27    CBC: Recent Labs  Lab 10/01/18 1554 10/03/18 0444  WBC 6.6 8.1  NEUTROABS 4.7  --   HGB 13.5 13.6  HCT 41.3 40.9  MCV 92.6 91.7  PLT 166 158    Cardiac Enzymes: No results for input(s): CKTOTAL, CKMB, CKMBINDEX, TROPONINI in the last 168  hours.  BNP: Invalid input(s): POCBNP  CBG: Recent Labs  Lab 10/01/18 [id number]    Microbiology: Results for orders placed or performed during the hospital encounter of 10/01/18  Blood Culture (routine x 2)     Status: None (Preliminary result)   Collection Time: 10/01/18  3:54 PM   Specimen: BLOOD  Result Value Ref Range Status   Specimen Description BLOOD L EJ  Final   Special Requests   Final    BOTTLES DRAWN AEROBIC AND ANAEROBIC Blood Culture results may not be optimal due to an excessive volume of blood received in culture bottles   Culture   Final    NO GROWTH 3 DAYS Performed at [facility], [address] [place], [place] [address]    Report Status PENDING  Incomplete  SARS Coronavirus 2 [facility] order, Performed in [place] hospital lab) Nasopharyngeal     Status: None   Collection Time: 10/01/18  5:29 PM   Specimen: Nasopharyngeal  Result Value Ref Range Status   SARS Coronavirus 2 NEGATIVE NEGATIVE Final    Comment: (NOTE) If result is NEGATIVE SARS-CoV-2 target nucleic acids are NOT DETECTED. The SARS-CoV-2 RNA is generally detectable in upper and lower  respiratory specimens during the acute phase of infection. The lowest  concentration of SARS-CoV-2 viral copies this assay can detect is 250  copies / mL. A negative result does not preclude SARS-CoV-2 infection  and should not be used as the sole basis for treatment or other  patient management decisions.  A negative result may occur with  improper specimen collection / handling, submission of specimen other  than nasopharyngeal swab, presence of viral mutation(s) within the  areas targeted by this assay, and inadequate number of viral copies  (<250 copies / mL). A negative result must be combined with clinical  observations, patient history, and epidemiological information. If result is POSITIVE SARS-CoV-2 target nucleic acids are DETECTED. The SARS-CoV-2 RNA is generally detectable  in upper and lower  respiratory specimens dur ing the acute phase of infection.  Positive  results are indicative of active infection with SARS-CoV-2.  Clinical  correlation with patient history and other diagnostic information is  necessary to determine patient infection status.  Positive results do  not rule out bacterial infection or co-infection with other viruses. If result is PRESUMPTIVE POSTIVE SARS-CoV-2 nucleic acids MAY BE PRESENT.   A presumptive positive result was obtained on the submitted specimen  and confirmed  on repeat testing.  While 2019 novel coronavirus  (SARS-CoV-2) nucleic acids may be present in the submitted sample  additional confirmatory testing may be necessary for epidemiological  and / or clinical management purposes  to differentiate between  SARS-CoV-2 and other Sarbecovirus currently known to infect humans.  If clinically indicated additional testing with an alternate test  methodology [phone]) is advised. The SARS-CoV-2 RNA is generally  detectable in upper and lower respiratory sp ecimens during the acute  phase of infection. The expected result is Negative. Fact Sheet for Patients:  StrictlyIdeas.no Fact Sheet for Healthcare Providers: BankingDealers.co.za This test is not yet approved or cleared by the [place] FDA and has been authorized for detection and/or diagnosis of SARS-CoV-2 by FDA under an Emergency Use Authorization (EUA).  This EUA will remain in effect (meaning this test can be used) for the duration of the COVID-19 declaration under Section 564(b)(1) of the Act, 21 U.S.C. section 360bbb-3(b)(1), unless the authorization is terminated or revoked sooner. Performed at [facility], [address] [place], [place] [address]     Coagulation Studies: No results for input(s): LABPROT, INR in the last 72 hours.  Urinalysis:  Recent Labs  Lab 10/01/18 1712  COLORURINE  YELLOW*  LABSPEC 1.013  PHURINE 5.0  GLUCOSEU NEGATIVE  HGBUR NEGATIVE  BILIRUBINUR NEGATIVE  KETONESUR NEGATIVE  PROTEINUR 30*  NITRITE NEGATIVE  LEUKOCYTESUR NEGATIVE    Lipid Panel:    Component Value Date/Time   CHOL 134 10/02/2018 0535   TRIG 58 10/02/2018 0535   HDL 43 10/02/2018 0535   CHOLHDL 3.1 10/02/2018 0535   VLDL 12 10/02/2018 0535   LDLCALC 79 10/02/2018 0535    HgbA1C:  Lab Results  Component Value Date   HGBA1C 6.5 (H) 10/02/2018    Urine Drug Screen:      Component Value Date/Time   LABOPIA NONE DETECTED 10/01/2018 1712   COCAINSCRNUR NONE DETECTED 10/01/2018 1712   LABBENZ NONE DETECTED 10/01/2018 1712   AMPHETMU NONE DETECTED 10/01/2018 1712   THCU NONE DETECTED 10/01/2018 1712   LABBARB NONE DETECTED 10/01/2018 1712    Alcohol Level: No results for input(s): ETH in the last 168 hours.  Other results: EKG: atrial fibrillation  Imaging: No results found.  Assessment: 84 y.o. female with a history of atrial fibrillation on Xarelto who presented lethargic on yesterday.  Currently appears at baseline  Alert.  Head CT reviewed and shows areas of hypodensity likely secondary to small vessel ischemic changes and chronic ischemic changes.  Patient unable to have MRI but on anticoagulation.  Carotid dopplers show no evidence of hemodynamically significant stenosis.  Echocardiogram pending.  A1c pending, LDL 79.  Stroke Risk Factors - atrial fibrillation, hyperlipidemia and hypertension  Plan: 1. Continue Xarelto 2.Mentation improved  3. Unable to obtain MRI due to PPM and I don't think she needs it this time.

## 2018-10-05 ENCOUNTER — Other Ambulatory Visit (INDEPENDENT_AMBULATORY_CARE_PROVIDER_SITE_OTHER): Payer: Self-pay | Admitting: Vascular Surgery

## 2018-10-05 LAB — CREATININE, SERUM
Creatinine, Ser: 0.93 mg/dL (ref 0.44–1.00)
GFR calc Af Amer: >60 mL/min (ref >60)
GFR calc non Af Amer: 57 mL/min — ABNORMAL LOW (ref >60)

## 2018-10-05 MED ORDER — CEFAZOLIN SODIUM-DEXTROSE 1-4 GM/50ML-% IV SOLN
1.0000 g | Freq: Three times a day (TID) | INTRAVENOUS | Status: DC
  Administered 2018-10-05 – 2018-10-07 (×6): 1 g via INTRAVENOUS
  Filled 2018-10-05 (×8): qty 50

## 2018-10-05 MED ORDER — CLINDAMYCIN PHOSPHATE 300 MG/50ML IV SOLN
300.0000 mg | Freq: Once | INTRAVENOUS | Status: AC
  Administered 2018-10-07: 300 mg via INTRAVENOUS
  Filled 2018-10-05: qty 50

## 2018-10-05 MED ORDER — SODIUM CHLORIDE 0.9 % IV SOLN
INTRAVENOUS | Status: DC
  Administered 2018-10-06: via INTRAVENOUS

## 2018-10-05 NOTE — TOC Progression Note (Signed)
Transition of Care [facility]) - Progression Note    Patient Details  Name: Julia Gordon MRN: [id number] Date of Birth: Mar 04, 1934  Transition of Care [facility]) CM/SW [place], LCSW Phone Number: 10/05/2018, 1:59 PM  Clinical Narrative: Met with patient and son at bedside. Daughter on speaker phone. Patient is now agreeable to SNF placement. First preference is [facility] because she went there for rehab in 2019. CSW sent referral and left message for admissions coordinator. Also provided patient and son with list of CMS scores for facilities within 25 miles of her zip code in case back up SNF needed.    Expected Discharge Plan: [place] Barriers to Discharge: Continued Medical Work up  Expected Discharge Plan and Services Expected Discharge Plan: [place] Choice: [place] arrangements for the past 2 months: Single Family Home                                       Social Determinants of Health (SDOH) Interventions    Readmission Risk Interventions Readmission Risk Prevention Plan 10/04/2018  Transportation Screening Complete  Social Work Consult for [place] Planning/Counseling [place] Not Applicable  Medication Review Press photographer) Complete  Some recent data might be hidden

## 2018-10-05 NOTE — Progress Notes (Signed)
Patient ID: Julia Gordon, female   DOB: 01-28-1934, 84 y.o.   MRN: [id number]  [employer] PROGRESS NOTE  Julia Gordon [id number] DOB: [birth year] DOA: 10/01/2018 PCP: Ellamae Sia, MD  HPI/Subjective: Patient states that she is feeling better and wanted to go home.  She states that her leg pain is a little bit better.  Patient states that she does not like the food here.  Objective: Vitals:   10/05/18 0009 10/05/18 0443  BP: 128/83 122/84  Pulse: 90 94  Resp: 20 19  Temp: (!) 97.5 F (36.4 C) 98.5 F (36.9 C)  SpO2: 99% 98%    Intake/Output Summary (Last 24 hours) at 10/05/2018 1337 Last data filed at 10/05/2018 1020 Gross per 24 hour  Intake 50 ml  Output 1500 ml  Net -1450 ml   Filed Weights   10/01/18 1540 10/01/18 2100  Weight: 79.2 kg 80.1 kg    ROS: Review of Systems  Constitutional: Negative for chills and fever.  Eyes: Negative for blurred vision.  Respiratory: Negative for cough and shortness of breath.   Cardiovascular: Negative for chest pain.  Gastrointestinal: Negative for abdominal pain, constipation, diarrhea, nausea and vomiting.  Genitourinary: Negative for dysuria.  Musculoskeletal: Positive for joint pain.  Neurological: Negative for dizziness and headaches.   Exam: Physical Exam  Constitutional: She appears lethargic.  HENT:  Nose: No mucosal edema.  Mouth/Throat: No oropharyngeal exudate or posterior oropharyngeal edema.  Eyes: Pupils are equal, round, and reactive to light. Conjunctivae, EOM and lids are normal.  Neck: No JVD present. Carotid bruit is not present. No edema present. No thyroid mass and no thyromegaly present.  Cardiovascular: S1 normal and S2 normal. Exam reveals no gallop.  No murmur heard. Respiratory: No respiratory distress. She has no wheezes. She has no rhonchi. She has no rales.  GI: Soft. Bowel sounds are normal. There is no abdominal tenderness.  Musculoskeletal:     Right ankle: She exhibits swelling.      Left ankle: She exhibits swelling.  Lymphadenopathy:    She has no cervical adenopathy.  Neurological: She appears lethargic.  Skin: Skin is warm. Nails show no clubbing.  Numerous ulcers bilateral lower extremities.  The patient's most painful ulcer is on the left posterior leg.  Chronic lower extremity skin discoloration and slight warmth.  Psychiatric:  Lethargic this morning      Data Reviewed: Basic Metabolic Panel: Recent Labs  Lab 10/01/18 1554 10/03/18 0444 10/05/18 0559  NA 140 141  --   K 3.5 3.1*  --   CL 105 104  --   CO2 25 24  --   GLUCOSE 95 99  --   BUN 35* 32*  --   CREATININE 1.29* 1.28* 0.93  CALCIUM 8.7* 8.6*  --    Liver Function Tests: Recent Labs  Lab 10/01/18 1554  AST 28  ALT 23  ALKPHOS 49  BILITOT 0.9  PROT 5.9*  ALBUMIN 2.9*    Recent Labs  Lab 10/01/18 2313  AMMONIA 27   CBC: Recent Labs  Lab 10/01/18 1554 10/03/18 0444  WBC 6.6 8.1  NEUTROABS 4.7  --   HGB 13.5 13.6  HCT 41.3 40.9  MCV 92.6 91.7  PLT 166 158  BNP (last 3 results) Recent Labs    06/27/18 1931 09/25/18 1831  BNP 2,183.0* 1,771.0*    CBG: Recent Labs  Lab 10/01/18 1608  GLUCAP 73    Recent Results (from the past 240 hour(s))  Blood  Culture (routine x 2)     Status: None (Preliminary result)   Collection Time: 10/01/18  3:54 PM   Specimen: BLOOD  Result Value Ref Range Status   Specimen Description BLOOD L EJ  Final   Special Requests   Final    BOTTLES DRAWN AEROBIC AND ANAEROBIC Blood Culture results may not be optimal due to an excessive volume of blood received in culture bottles   Culture   Final    NO GROWTH 4 DAYS Performed at [facility], [address] [place], [place] [address]    Report Status PENDING  Incomplete  SARS Coronavirus 2 [facility] order, Performed in [place] hospital lab) Nasopharyngeal     Status: None   Collection Time: 10/01/18  5:29 PM   Specimen: Nasopharyngeal  Result Value Ref Range Status    SARS Coronavirus 2 NEGATIVE NEGATIVE Final    Comment: (NOTE) If result is NEGATIVE SARS-CoV-2 target nucleic acids are NOT DETECTED. The SARS-CoV-2 RNA is generally detectable in upper and lower  respiratory specimens during the acute phase of infection. The lowest  concentration of SARS-CoV-2 viral copies this assay can detect is 250  copies / mL. A negative result does not preclude SARS-CoV-2 infection  and should not be used as the sole basis for treatment or other  patient management decisions.  A negative result may occur with  improper specimen collection / handling, submission of specimen other  than nasopharyngeal swab, presence of viral mutation(s) within the  areas targeted by this assay, and inadequate number of viral copies  (<250 copies / mL). A negative result must be combined with clinical  observations, patient history, and epidemiological information. If result is POSITIVE SARS-CoV-2 target nucleic acids are DETECTED. The SARS-CoV-2 RNA is generally detectable in upper and lower  respiratory specimens dur ing the acute phase of infection.  Positive  results are indicative of active infection with SARS-CoV-2.  Clinical  correlation with patient history and other diagnostic information is  necessary to determine patient infection status.  Positive results do  not rule out bacterial infection or co-infection with other viruses. If result is PRESUMPTIVE POSTIVE SARS-CoV-2 nucleic acids MAY BE PRESENT.   A presumptive positive result was obtained on the submitted specimen  and confirmed on repeat testing.  While 2019 novel coronavirus  (SARS-CoV-2) nucleic acids may be present in the submitted sample  additional confirmatory testing may be necessary for epidemiological  and / or clinical management purposes  to differentiate between  SARS-CoV-2 and other Sarbecovirus currently known to infect humans.  If clinically indicated additional testing with an alternate test   methodology [phone]) is advised. The SARS-CoV-2 RNA is generally  detectable in upper and lower respiratory sp ecimens during the acute  phase of infection. The expected result is Negative. Fact Sheet for Patients:  StrictlyIdeas.no Fact Sheet for Healthcare Providers: BankingDealers.co.za This test is not yet approved or cleared by the [place] FDA and has been authorized for detection and/or diagnosis of SARS-CoV-2 by FDA under an Emergency Use Authorization (EUA).  This EUA will remain in effect (meaning this test can be used) for the duration of the COVID-19 declaration under Section 564(b)(1) of the Act, 21 U.S.C. section 360bbb-3(b)(1), unless the authorization is terminated or revoked sooner. Performed at [facility], [address] [place], [place] [address]      Studies: No results found.  Scheduled Meds: . atorvastatin  40 mg Oral q1800  . Chlorhexidine Gluconate Cloth  6  each Topical Daily  . furosemide  40 mg Oral BID  . mouth rinse  15 mL Mouth Rinse BID  . metoprolol succinate  12.5 mg Oral Daily  . Rivaroxaban  15 mg Oral Q supper  . sacubitril-valsartan  1 tablet Oral BID   Continuous Infusions: . [START ON 10/06/2018] sodium chloride    .  ceFAZolin (ANCEF) IV      Assessment/Plan:  1. Stroke left frontal lobe age-indeterminate.  Carotid ultrasound negative.  Echocardiogram shows an ejection fraction less than 20%..  Patient given aspirin on admission.  On Xarelto.  Neurology consult appreciated.  Physical therapy recommended rehab.  Patient would like to go home though but daughter would like her to go to rehab. 2. Acute metabolic encephalopathy.  Mental status much improved. 3. Bilateral lower extremity weeping wounds.  Continue Lasix.  Continue Ancef.  Wound care consult.  Follows up with the wound care center as outpatient.  Vascular to do angiogram of the left lower extremity tomorrow  morning. 4. Chronic systolic congestive heart failure.  Continue Lasix for the weeping edema.  Restarted low-dose Toprol-XL 12.5 mg twice a day.  Low-dose Entresto. 5. Right lower extremity DVT on Xarelto. 6. Chronic atrial fibrillation on Xarelto and metoprolol. 7. Chronic kidney disease stage III. 8. Hyperlipidemia lovastatin switched to Lipitor.  Code Status:     Code Status Orders  (From admission, onward)         Start     Ordered   10/01/18 2142  Full code  Continuous     10/01/18 2141        Code Status History    Date Active Date Inactive Code Status Order ID Comments User Context   06/27/2018 2303 06/30/2018 1504 Full Code [id number]  Mayer Camel, NP ED   07/20/2017 0231 07/23/2017 1926 Full Code [id number]  Lance Coon, MD Inpatient   Advance Care Planning Activity     Family Communication: Spoke with daughter on the floor. Disposition Plan: Patient would like to go home but daughter would like the patient to go to rehab.  Anticipate potential discharge on Thursday.  Patient will have angiogram tomorrow.  Antibiotics:  Ancef  Time spent: 28 minutes  [place]

## 2018-10-05 NOTE — Progress Notes (Signed)
PHARMACY NOTE:  ANTIMICROBIAL RENAL DOSAGE ADJUSTMENT  Current antimicrobial regimen includes a mismatch between antimicrobial dosage and estimated renal function.  As per policy approved by the Pharmacy & Therapeutics and Medical Executive Committees, the antimicrobial dosage will be adjusted accordingly.  Current antimicrobial dosage:  Cefazolin 1g Q12H  Indication: Cellulitis   Renal Function:  Estimated Creatinine Clearance: 45.7 mL/min (by C-G formula based on SCr of 0.93 mg/dL).   Antimicrobial dosage has been changed to:  Cefazolin 1g Q8H   Additional comments: Will f/u on renal function with AM labs   Thank you for allowing pharmacy to be a part of this patient's care.  Lu Duffel, PharmD, BCPS Clinical Pharmacist 10/05/2018 7:21 AM

## 2018-10-05 NOTE — Progress Notes (Signed)
Physical Therapy Treatment Patient Details Name: Julia Gordon MRN: [id number] DOB: 1934/03/18 Today's Date: 10/05/2018    History of Present Illness Per neurologist assessment: Pt is an 84 y.o. female with a history of atrial fibrillation on Xarelto who presented lethargic.  Currently appears at baseline.  Alert.  Head CT reviewed and shows areas of hypodensity likely secondary to small vessel ischemic changes and chronic ischemic changes.  Patient unable to have MRI but on anticoagulation.  Carotid dopplers show no evidence of hemodynamically significant stenosis.  MD assessment also includes: Acute metabolic encephalopathy, BLE weeping wounds, chronic CHF, Right lower extremity DVT on Xarelto, chronic a-fib, CKD III, and HLD.    PT Comments    Pt appearing with general confusion and forgetfulness during session.  Pt agreeable to attempting to walk short distance.  Pt mod to max assist x2 to perform 1/2 stand 1st trial and 3/4th stand 2nd trial standing up to RW (from sitting edge of bed).  Pt requesting to take time with all mobility d/t L>R LE pain and pt reporting 10/10 L LE pain with standing attempts which limited what pt was able to perform.  Pt assisted back to bed, repositioned for comfort, and nurse notified of pt's request for pain meds.  Will continue to progress pt with strengthening, balance, and progressive functional mobility per pt tolerance.   Follow Up Recommendations  SNF     Equipment Recommendations  Rolling walker with 5" wheels;3in1 (PT);Wheelchair (measurements PT);Wheelchair cushion (measurements PT)    Recommendations for Other Services       Precautions / Restrictions Precautions Precautions: Fall Precaution Comments: R chest port; B LE wounds Restrictions Weight Bearing Restrictions: No    Mobility  Bed Mobility Overal bed mobility: Needs Assistance Bed Mobility: Supine to Sit;Sit to Supine Rolling: Min assist(logroll to L and R in bed to adjust bed  sheets)   Supine to sit: Mod assist;Max assist Sit to supine: +2 for physical assistance   General bed mobility comments: assist for trunk and B LE's semi-supine to/from sit; 2 assist to boost pt up in bed end of session  Transfers Overall transfer level: Needs assistance Equipment used: Rolling walker (2 wheeled) Transfers: Sit to/from Stand Sit to Stand: Mod assist;Max assist;+2 physical assistance         General transfer comment: x2 trials; 1st trial pt able to perform partial stand and 2nd trial pt able to perform 3/4th stand (up to RW); vc's for UE and LE placement  Ambulation/Gait             General Gait Details: unable to stand to attempt ambulation   Stairs             Wheelchair Mobility    Modified Rankin (Stroke Patients Only)       Balance Overall balance assessment: Needs assistance Sitting-balance support: Single extremity supported;Feet supported Sitting balance-Leahy Scale: Poor Sitting balance - Comments: pt requiring at least single UE support for static sitting balance (limited d/t L LE pain)                                    Cognition Arousal/Alertness: Awake/alert Behavior During Therapy: Anxious Overall Cognitive Status: (pt appearing forgetful during session with multiple different topics)  General Comments: Inconsistent following 1 step commands      Exercises      General Comments   Nursing cleared pt for participation in physical therapy.  Pt agreeable to PT session.  Pt's son present beginning/end of session but left room for functional mobility portion of session.      Pertinent Vitals/Pain Pain Assessment: 0-10 Pain Score: 10-Worst pain ever Pain Location: L LE (lower leg/ankle) Pain Descriptors / Indicators: Grimacing;Guarding;Tender;Sore Pain Intervention(s): Limited activity within patient's tolerance;Monitored during session;Repositioned;Patient  requesting pain meds-RN notified    Home Living                      Prior Function            PT Goals (current goals can now be found in the care plan section) Acute Rehab PT Goals Patient Stated Goal: To go home PT Goal Formulation: With patient Time For Goal Achievement: 10/15/18 Potential to Achieve Goals: Fair Progress towards PT goals: Progressing toward goals    Frequency    Min 2X/week      PT Plan Current plan remains appropriate    Co-evaluation              AM-PAC PT "6 Clicks" Mobility   Outcome Measure  Help needed turning from your back to your side while in a flat bed without using bedrails?: A Little Help needed moving from lying on your back to sitting on the side of a flat bed without using bedrails?: A Lot Help needed moving to and from a bed to a chair (including a wheelchair)?: Total Help needed standing up from a chair using your arms (e.g., wheelchair or bedside chair)?: Total Help needed to walk in hospital room?: Total Help needed climbing 3-5 steps with a railing? : Total 6 Click Score: 9    End of Session Equipment Utilized During Treatment: Gait belt;Oxygen Activity Tolerance: Patient limited by pain Patient left: in bed;with call bell/phone within reach;with bed alarm set;with family/visitor present;Other (comment)(pillow placed between pt's knees and lower legs to improve comfort) Nurse Communication: Mobility status;Patient requests pain meds;Precautions;Other (comment)(NT notified pt needing purewick removed; nurse notified pt requesting water and pain meds) PT Visit Diagnosis: Muscle weakness (generalized) (M62.81);Difficulty in walking, not elsewhere classified (R26.2);Pain Pain - Right/Left: Left Pain - part of body: Leg     Time: [id number] PT Time Calculation (min) (ACUTE ONLY): 30 min  Charges:  $Therapeutic Activity: 23-37 mins                     Leitha Bleak, PT 10/05/18, 2:52 PM [phone]

## 2018-10-05 NOTE — NC FL2 (Signed)
[place] LEVEL OF CARE SCREENING TOOL     IDENTIFICATION  Patient Name: Julia Gordon Birthdate: [birth year] Sex: female Admission Date (Current Location): 10/01/2018  [place] and [place] Number:  Engineering geologist and Address:  [facility], [address], [place], [place] [address]      Provider Number: [id number]  Attending Physician Name and Address:  Loletha Grayer, MD  Relative Name and Phone Number:       Current Level of Care: Hospital Recommended Level of Care: [place] Prior Approval Number:    Date Approved/Denied:   PASRR Number: [id number]  Discharge Plan: SNF    Current Diagnoses: Patient Active Problem List   Diagnosis Date Noted  . Pressure injury of skin 10/02/2018  . Somnolence 10/01/2018  . Stroke ([place]) 10/01/2018  . Persistent atrial fibrillation 09/23/2018  . Skin ulcer, limited to breakdown of skin ([place]) 09/23/2018  . Left arm swelling 09/23/2018  . Acute deep vein thrombosis (DVT) of left lower extremity ([place]) 06/27/2018  . Age-related osteoporosis with current pathological fracture with routine healing 07/24/2017  . Breast cancer ([place]) 07/24/2017  . Femur fracture ([place]) 07/20/2017  . HTN (hypertension) 07/20/2017  . PAF (paroxysmal atrial fibrillation) ([place]) 07/20/2017  . Primary cancer of upper inner quadrant of left female breast ([place]) 07/17/2014  . Acute on chronic HFrEF (heart failure with reduced ejection fraction) ([place]) 03/24/2011  . ICD (implantable cardiac defibrillator) in place 03/24/2011  . Dyslipidemia 03/24/2011  . Other primary cardiomyopathies     Orientation RESPIRATION BLADDER Height & Weight     Self, Time, Situation, Place  Normal Incontinent, External catheter Weight: 176 lb 9.4 oz (80.1 kg) Height:  5\' 2"  (157.5 cm)  BEHAVIORAL SYMPTOMS/MOOD NEUROLOGICAL BOWEL NUTRITION STATUS  (None) (Stroke) Continent Diet(Heart healthy)  AMBULATORY STATUS COMMUNICATION  OF NEEDS Skin   Extensive Assist Verbally Skin abrasions, PU Stage and Appropriate Care, Other (Comment)(Cellulitis, MASD, Rash, Weeping.)   PU Stage 2 Dressing: (Medial sacrum: Foam (lift to assess every shift) prn. Posterior left tibial: Foam (lift to assess every shift) prn. Left pretibial: No dressing.)                   Personal Care Assistance Level of Assistance  Bathing, Feeding, Dressing Bathing Assistance: Limited assistance Feeding assistance: Limited assistance Dressing Assistance: Limited assistance     Functional Limitations Info  Sight, Hearing, Speech Sight Info: Adequate Hearing Info: Adequate Speech Info: Adequate    SPECIAL CARE FACTORS FREQUENCY  PT (By licensed PT), OT (By licensed OT)     PT Frequency: 5 x week OT Frequency: 5 x week            Contractures Contractures Info: Not present    Additional Factors Info  Code Status, Allergies Code Status Info: Full code Allergies Info: NKDA           Current Medications (10/05/2018):  This is the current hospital active medication list Current Facility-Administered Medications  Medication Dose Route Frequency Provider Last Rate Last Dose  . [START ON 10/06/2018] 0.9 %  sodium chloride infusion   Intravenous Continuous Stegmayer, Kimberly A, PA-C      . acetaminophen (TYLENOL) tablet 650 mg  650 mg Oral Q4H PRN Mayo, Pete Pelt, MD       Or  . acetaminophen (TYLENOL) solution 650 mg  650 mg Per Tube Q4H PRN Mayo, Pete Pelt, MD       Or  . acetaminophen (TYLENOL) suppository  650 mg  650 mg Rectal Q4H PRN Mayo, Pete Pelt, MD      . atorvastatin (LIPITOR) tablet 40 mg  40 mg Oral q1800 Mayo, Pete Pelt, MD   40 mg at 10/04/18 1708  . ceFAZolin (ANCEF) IVPB 1 g/50 mL premix  1 g Intravenous Q8H Shanlever, Pierce Crane, RPH      . Chlorhexidine Gluconate Cloth 2 % PADS 6 each  6 each Topical Daily Loletha Grayer, MD   6 each at 10/05/18 0908  . furosemide (LASIX) tablet 40 mg  40 mg Oral BID Mayo, Pete Pelt, MD   40 mg at 10/05/18 [id number]  . haloperidol lactate (HALDOL) injection 1 mg  1 mg Intravenous Q6H PRN Wieting, Richard, MD      . ipratropium-albuterol (DUONEB) 0.5-2.5 (3) MG/3ML nebulizer solution 3 mL  3 mL Nebulization Q6H PRN Mayo, Pete Pelt, MD      . MEDLINE mouth rinse  15 mL Mouth Rinse BID Loletha Grayer, MD   15 mL at 10/05/18 0909  . metoprolol succinate (TOPROL-XL) 24 hr tablet 12.5 mg  12.5 mg Oral Daily Loletha Grayer, MD   12.5 mg at 10/05/18 0904  . morphine 2 MG/ML injection 1 mg  1 mg Intravenous Q3H PRN Wieting, Richard, MD      . Rivaroxaban (XARELTO) tablet 15 mg  15 mg Oral Q supper Mayo, Pete Pelt, MD   15 mg at 10/04/18 1710  . sacubitril-valsartan (ENTRESTO) 24-26 mg per tablet  1 tablet Oral BID Loletha Grayer, MD   1 tablet at 10/05/18 0904  . senna-docusate (Senokot-S) tablet 1 tablet  1 tablet Oral QHS PRN Mayo, Pete Pelt, MD      . traMADol Veatrice Bourbon) tablet 50 mg  50 mg Oral Q6H PRN Mayo, Pete Pelt, MD   50 mg at 10/02/18 1531     Discharge Medications: Please see discharge summary for a list of discharge medications.  Relevant Imaging Results:  Relevant Lab Results:   Additional Information SS#: [id number]  Candie Chroman, LCSW

## 2018-10-05 NOTE — Plan of Care (Signed)
  Problem: Education: Goal: Knowledge of disease or condition will improve Outcome: Progressing   Problem: Education: Goal: Knowledge of secondary prevention will improve Outcome: Progressing   Problem: Education: Goal: Knowledge of patient specific risk factors addressed and post discharge goals established will improve Outcome: Progressing   Problem: Coping: Goal: Will identify appropriate support needs Outcome: Progressing   Problem: Health Behavior/Discharge Planning: Goal: Ability to manage health-related needs will improve Outcome: Progressing

## 2018-10-06 ENCOUNTER — Other Ambulatory Visit (INDEPENDENT_AMBULATORY_CARE_PROVIDER_SITE_OTHER): Payer: Self-pay | Admitting: Vascular Surgery

## 2018-10-06 ENCOUNTER — Other Ambulatory Visit (INDEPENDENT_AMBULATORY_CARE_PROVIDER_SITE_OTHER): Payer: Self-pay | Admitting: Nurse Practitioner

## 2018-10-06 LAB — CBC
HCT: 42.4 % (ref 36.0–46.0)
Hemoglobin: 13.9 g/dL (ref 12.0–15.0)
MCH: 29.9 pg (ref 26.0–34.0)
MCHC: 32.8 g/dL (ref 30.0–36.0)
MCV: 91.2 fL (ref 80.0–100.0)
Platelets: 165 10*3/uL (ref 150–400)
RBC: 4.65 MIL/uL (ref 3.87–5.11)
RDW: 15.7 % — ABNORMAL HIGH (ref 11.5–15.5)
WBC: 8.7 10*3/uL (ref 4.0–10.5)
nRBC: 0 % (ref 0.0–0.2)

## 2018-10-06 LAB — CULTURE, BLOOD (ROUTINE X 2): Culture: NO GROWTH

## 2018-10-06 LAB — BASIC METABOLIC PANEL
Anion gap: 6 (ref 5–15)
BUN: 16 mg/dL (ref 8–23)
CO2: 30 mmol/L (ref 22–32)
Calcium: 7.9 mg/dL — ABNORMAL LOW (ref 8.9–10.3)
Chloride: 104 mmol/L (ref 98–111)
Creatinine, Ser: 0.91 mg/dL (ref 0.44–1.00)
GFR calc Af Amer: >60 mL/min (ref >60)
GFR calc non Af Amer: 59 mL/min — ABNORMAL LOW (ref >60)
Glucose, Bld: 105 mg/dL — ABNORMAL HIGH (ref 70–99)
Potassium: 2.5 mmol/L — CL (ref 3.5–5.1)
Sodium: 140 mmol/L (ref 135–145)

## 2018-10-06 LAB — SARS CORONAVIRUS 2 (TAT 6-24 HRS): SARS Coronavirus 2: NEGATIVE

## 2018-10-06 LAB — POTASSIUM: Potassium: 3.2 mmol/L — ABNORMAL LOW (ref 3.5–5.1)

## 2018-10-06 MED ORDER — SODIUM CHLORIDE 0.9 % IV SOLN
INTRAVENOUS | Status: DC
  Administered 2018-10-07: 01:00:00 via INTRAVENOUS

## 2018-10-06 MED ORDER — POTASSIUM CHLORIDE 10 MEQ/100ML IV SOLN
10.0000 meq | INTRAVENOUS | Status: AC
  Administered 2018-10-06 (×5): 10 meq via INTRAVENOUS
  Filled 2018-10-06 (×4): qty 100

## 2018-10-06 MED ORDER — FUROSEMIDE 40 MG PO TABS
40.0000 mg | ORAL_TABLET | Freq: Two times a day (BID) | ORAL | Status: DC
  Administered 2018-10-06 – 2018-10-08 (×4): 40 mg via ORAL
  Filled 2018-10-06 (×3): qty 1

## 2018-10-06 MED ORDER — FUROSEMIDE 40 MG PO TABS: 40.0000 mg | ORAL_TABLET | Freq: Every day | ORAL | Status: DC

## 2018-10-06 MED ORDER — POTASSIUM CHLORIDE CRYS ER 20 MEQ PO TBCR: 40.0000 meq | EXTENDED_RELEASE_TABLET | Freq: Once | ORAL | Status: DC

## 2018-10-06 MED ORDER — SACUBITRIL-VALSARTAN 24-26 MG PO TABS: 1.0000 | ORAL_TABLET | Freq: Two times a day (BID) | ORAL | 0 refills | Status: AC

## 2018-10-06 MED ORDER — CLINDAMYCIN PHOSPHATE 300 MG/50ML IV SOLN
INTRAVENOUS | Status: AC
  Filled 2018-10-06: qty 50

## 2018-10-06 NOTE — Progress Notes (Signed)
OT Cancellation Note  Patient Details Name: Julia Gordon MRN: [id number] DOB: 12/22/1934   Cancelled Treatment:    Reason Eval/Treat Not Completed: (Pt. is having a LE Angiogram procedure. Will continue to monitor, and intervene when appropriate.)  Harrel Carina, MS, OTR/L 10/06/2018, 12:29 PM

## 2018-10-06 NOTE — Care Management Important Message (Signed)
Important Message  Patient Details  Name: SCHUYLER HALPERIN MRN: [id number] Date of Birth: 1934-03-01   Medicare Important Message Given:  Yes     [name] A Zurisadai Helminiak 10/06/2018, 10:55 AM

## 2018-10-06 NOTE — TOC Benefit Eligibility Note (Signed)
Transition of Care [facility]) Benefit Eligibility Note    Patient Details  Name: Julia Gordon MRN: [id number] Date of Birth: [birth year]   Medication/Dose: [name] 24-58m tablet - 1 tablet PO BID  Covered?: Yes  Tier: 3 Drug  Prescription Coverage [facility]erred Pharmacy: [facility]  Spoke with Person/Company/Phone Number:: JDi Kindlewith [employer] at [phone] Co-Pay: $116.54 estimated cost for 30 day supply retail as deductible unmet.  Once met, $47.00 estimated copay for 30 day supply retail and $131.00 estimated copay mail order  Prior Approval: No  Deductible: Unmet($95 deductible unmet as of time of call.)   HDannette BarbaraPhone Number: [phone] [phone]/30/2020, 10:01 AM

## 2018-10-06 NOTE — Progress Notes (Signed)
Patient ID: Julia Gordon, female   DOB: [birth year], 84 y.o.   MRN: [id number]  [employer] PROGRESS NOTE  Julia Gordon [id number] DOB: [birth year] DOA: 10/01/2018 PCP: Ellamae Sia, MD  HPI/Subjective: Patient asking when she can get out of here.  Patient states her leg pain is not that bad but unable to put much pressure on it when she is standing.  Objective: Vitals:   10/06/18 0001 10/06/18 0830  BP: 113/78 118/81  Pulse: 92 89  Resp: 20 16  Temp: 97.6 F (36.4 C) 97.9 F (36.6 C)  SpO2: 100% 97%    Intake/Output Summary (Last 24 hours) at 10/06/2018 1258 Last data filed at 10/06/2018 0830 Gross per 24 hour  Intake 170 ml  Output 2200 ml  Net -2030 ml   Filed Weights   10/01/18 1540 10/01/18 2100  Weight: 79.2 kg 80.1 kg    ROS: Review of Systems  Constitutional: Negative for chills and fever.  Eyes: Negative for blurred vision.  Respiratory: Negative for cough and shortness of breath.   Cardiovascular: Negative for chest pain.  Gastrointestinal: Negative for abdominal pain, constipation, diarrhea, nausea and vomiting.  Genitourinary: Negative for dysuria.  Musculoskeletal: Positive for joint pain.  Neurological: Negative for dizziness and headaches.   Exam: Physical Exam  HENT:  Nose: No mucosal edema.  Mouth/Throat: No oropharyngeal exudate or posterior oropharyngeal edema.  Eyes: Pupils are equal, round, and reactive to light. Conjunctivae, EOM and lids are normal.  Neck: No JVD present. Carotid bruit is not present. No edema present. No thyroid mass and no thyromegaly present.  Cardiovascular: S1 normal and S2 normal. Exam reveals no gallop.  No murmur heard. Respiratory: No respiratory distress. She has no wheezes. She has no rhonchi. She has no rales.  GI: Soft. Bowel sounds are normal. There is no abdominal tenderness.  Musculoskeletal:     Right ankle: She exhibits swelling.     Left ankle: She exhibits swelling.  Lymphadenopathy:    She  has no cervical adenopathy.  Neurological: She is alert.  Skin: Skin is warm. Nails show no clubbing.  Numerous ulcers bilateral lower extremities.  The patient's most painful ulcer is on the left posterior leg.  Chronic lower extremity skin discoloration and slight warmth.  Psychiatric: She has a normal mood and affect.      Data Reviewed: Basic Metabolic Panel: Recent Labs  Lab 10/01/18 1554 10/03/18 0444 10/05/18 0559 10/06/18 0457 10/06/18 1154  NA 140 141  --  140  --   K 3.5 3.1*  --  2.5* 3.2*  CL 105 104  --  104  --   CO2 25 24  --  30  --   GLUCOSE 95 99  --  105*  --   BUN 35* 32*  --  16  --   CREATININE 1.29* 1.28* 0.93 0.91  --   CALCIUM 8.7* 8.6*  --  7.9*  --    Liver Function Tests: Recent Labs  Lab 10/01/18 1554  AST 28  ALT 23  ALKPHOS 49  BILITOT 0.9  PROT 5.9*  ALBUMIN 2.9*    Recent Labs  Lab 10/01/18 2313  AMMONIA 27   CBC: Recent Labs  Lab 10/01/18 1554 10/03/18 0444 10/06/18 0457  WBC 6.6 8.1 8.7  NEUTROABS 4.7  --   --   HGB 13.5 13.6 13.9  HCT 41.3 40.9 42.4  MCV 92.6 91.7 91.2  PLT 166 158 165  BNP (last 3 results) Recent  Labs    06/27/18 1931 09/25/18 1831  BNP 2,183.0* 1,771.0*    CBG: Recent Labs  Lab 10/01/18 1608  GLUCAP 73    Recent Results (from the past 240 hour(s))  Blood Culture (routine x 2)     Status: None   Collection Time: 10/01/18  3:54 PM   Specimen: BLOOD  Result Value Ref Range Status   Specimen Description BLOOD L EJ  Final   Special Requests   Final    BOTTLES DRAWN AEROBIC AND ANAEROBIC Blood Culture results may not be optimal due to an excessive volume of blood received in culture bottles   Culture   Final    NO GROWTH 5 DAYS Performed at [facility], [address] [place], [place] [address]    Report Status 10/06/2018 FINAL  Final  SARS Coronavirus 2 [facility] order, Performed in [place] hospital lab) Nasopharyngeal     Status: None   Collection Time: 10/01/18   5:29 PM   Specimen: Nasopharyngeal  Result Value Ref Range Status   SARS Coronavirus 2 NEGATIVE NEGATIVE Final    Comment: (NOTE) If result is NEGATIVE SARS-CoV-2 target nucleic acids are NOT DETECTED. The SARS-CoV-2 RNA is generally detectable in upper and lower  respiratory specimens during the acute phase of infection. The lowest  concentration of SARS-CoV-2 viral copies this assay can detect is 250  copies / mL. A negative result does not preclude SARS-CoV-2 infection  and should not be used as the sole basis for treatment or other  patient management decisions.  A negative result may occur with  improper specimen collection / handling, submission of specimen other  than nasopharyngeal swab, presence of viral mutation(s) within the  areas targeted by this assay, and inadequate number of viral copies  (<250 copies / mL). A negative result must be combined with clinical  observations, patient history, and epidemiological information. If result is POSITIVE SARS-CoV-2 target nucleic acids are DETECTED. The SARS-CoV-2 RNA is generally detectable in upper and lower  respiratory specimens dur ing the acute phase of infection.  Positive  results are indicative of active infection with SARS-CoV-2.  Clinical  correlation with patient history and other diagnostic information is  necessary to determine patient infection status.  Positive results do  not rule out bacterial infection or co-infection with other viruses. If result is PRESUMPTIVE POSTIVE SARS-CoV-2 nucleic acids MAY BE PRESENT.   A presumptive positive result was obtained on the submitted specimen  and confirmed on repeat testing.  While 2019 novel coronavirus  (SARS-CoV-2) nucleic acids may be present in the submitted sample  additional confirmatory testing may be necessary for epidemiological  and / or clinical management purposes  to differentiate between  SARS-CoV-2 and other Sarbecovirus currently known to infect humans.   If clinically indicated additional testing with an alternate test  methodology [id number]) is advised. The SARS-CoV-2 RNA is generally  detectable in upper and lower respiratory sp ecimens during the acute  phase of infection. The expected result is Negative. Fact Sheet for Patients:  StrictlyIdeas.no Fact Sheet for Healthcare Providers: BankingDealers.co.za This test is not yet approved or cleared by the [place] FDA and has been authorized for detection and/or diagnosis of SARS-CoV-2 by FDA under an Emergency Use Authorization (EUA).  This EUA will remain in effect (meaning this test can be used) for the duration of the COVID-19 declaration under Section 564(b)(1) of the Act, 21 U.S.C. section 360bbb-3(b)(1), unless the authorization is terminated or revoked sooner. Performed at  [address] [place], [place] [address]       Scheduled Meds: . atorvastatin  40 mg Oral q1800  . Chlorhexidine Gluconate Cloth  6 each Topical Daily  . [START ON 10/07/2018] furosemide  40 mg Oral Daily  . mouth rinse  15 mL Mouth Rinse BID  . metoprolol succinate  12.5 mg Oral Daily  . potassium chloride  40 mEq Oral Once  . Rivaroxaban  15 mg Oral Q supper  . sacubitril-valsartan  1 tablet Oral BID   Continuous Infusions: . sodium chloride 75 mL/hr at 10/06/18 0003  .  ceFAZolin (ANCEF) IV 1 g (10/06/18 0508)  . clindamycin (CLEOCIN) IV      Assessment/Plan:  1. Stroke left frontal lobe age-indeterminate.  Carotid ultrasound negative.  Echocardiogram shows an ejection fraction less than 20%.  Patient given aspirin on admission.  On Xarelto.  Neurology consult appreciated.  Physical therapy recommended rehab.  Patient now agreeable to rehab. 2. Acute metabolic encephalopathy.  Mental status much improved.  Daughter states patient has had periods of confusion.  Would like her to follow-up as outpatient with her  memory. 3. Hypokalemia.  Replace potassium orally and IV.  Recheck today shows potassium up to 3.2.  Held Lasix this morning. 4. Bilateral lower extremity weeping wounds.  Continue Lasix.  Switch Ancef to Keflex after procedure. Follows up with the wound care center as outpatient.  Vascular to do angiogram of the left lower extremity today 5. Chronic systolic congestive heart failure.  On low-dose Entresto, low-dose Toprol, Lasix.  Consider Aldactone as outpatient.  Would rather not start so many medications at once. 6. Right lower extremity DVT on Xarelto. 7. Chronic atrial fibrillation on Xarelto and metoprolol. 8. Chronic kidney disease stage III. 9. Hyperlipidemia lovastatin switched to Lipitor.  Code Status:     Code Status Orders  (From admission, onward)         Start     Ordered   10/01/18 2142  Full code  Continuous     10/01/18 2141        Code Status History    Date Active Date Inactive Code Status Order ID Comments User Context   06/27/2018 2303 06/30/2018 1504 Full Code [id number]  Mayer Camel, NP ED   07/20/2017 0231 07/23/2017 1926 Full Code [id number]  Lance Coon, MD Inpatient   Advance Care Planning Activity     Family Communication: Spoke with daughter on the floor. Disposition Plan: Potentially out to rehab tomorrow.  Repeat COVID testing today so we have results for tomorrow  Antibiotics:  Switch Ancef over to Keflex after procedure.  Time spent: 28 minutes  [place]

## 2018-10-06 NOTE — Progress Notes (Signed)
[facility]   Due to unexpected emergencies in the endovascular suite today will reschedule the patient undergo left lower extremity angiogram with possible intervention with Dr. Lucky Cowboy on Thursday 10/07/18. Diet resumed. Will pre-op.  Discussed with Dr. Ellis Parents Berkleigh Beckles PA-C 10/06/2018 2:43 PM

## 2018-10-06 NOTE — TOC Progression Note (Addendum)
Transition of Care [facility]) - Progression Note    Patient Details  Name: MARYCATHERINE PICASO MRN: [id number] Date of Birth: 1934-05-14  Transition of Care [facility]) CM/SW [place], LCSW Phone Number: 10/06/2018, 9:38 AM  Clinical Narrative:  Patient started [name] this admission and [place] admissions coordinator said it would cost $600 to fill. They can offer a bed if family is willing to take a hard prescription to patient's pharmacy to fill it there and bring it to the facility. Daughter aware and agreeable. Will give a 30-day free card for this medication. CSW has sent in request for benefits check. [place] will start insurance authorization. MD has placed order for COVID test.  10:23 am: Gave patient's daughter hard prescription and Entresto card as well as benefits check information.  Expected Discharge Plan: [place] Barriers to Discharge: Continued Medical Work up  Expected Discharge Plan and Services Expected Discharge Plan: [place] Choice: [place] arrangements for the past 2 months: Single Family Home                                       Social Determinants of Health (SDOH) Interventions    Readmission Risk Interventions Readmission Risk Prevention Plan 10/04/2018  Transportation Screening Complete  Social Work Consult for [place] Planning/Counseling [place] Not Applicable  Medication Review Press photographer) Complete  Some recent data might be hidden

## 2018-10-07 ENCOUNTER — Encounter
Admission: EM | Disposition: A | Discharge status: Skilled Nursing Facility | Payer: Medicare Other | Source: Home / Self Care | Attending: Internal Medicine

## 2018-10-07 ENCOUNTER — Encounter: Payer: Medicare Other | Admitting: Internal Medicine

## 2018-10-07 DIAGNOSIS — I70245 Atherosclerosis of native arteries of left leg with ulceration of other part of foot: Secondary | ICD-10-CM

## 2018-10-07 HISTORY — PX: LOWER EXTREMITY ANGIOGRAPHY: CATH118251

## 2018-10-07 LAB — BASIC METABOLIC PANEL
Anion gap: 11 (ref 5–15)
BUN: 18 mg/dL (ref 8–23)
CO2: 27 mmol/L (ref 22–32)
Calcium: 8.2 mg/dL — ABNORMAL LOW (ref 8.9–10.3)
Chloride: 102 mmol/L (ref 98–111)
Creatinine, Ser: 0.93 mg/dL (ref 0.44–1.00)
GFR calc Af Amer: >60 mL/min (ref >60)
GFR calc non Af Amer: 57 mL/min — ABNORMAL LOW (ref >60)
Glucose, Bld: 117 mg/dL — ABNORMAL HIGH (ref 70–99)
Potassium: 3.3 mmol/L — ABNORMAL LOW (ref 3.5–5.1)
Sodium: 140 mmol/L (ref 135–145)

## 2018-10-07 LAB — MAGNESIUM: Magnesium: 1.8 mg/dL (ref 1.7–2.4)

## 2018-10-07 SURGERY — LOWER EXTREMITY ANGIOGRAPHY
Anesthesia: Moderate Sedation | Laterality: Left

## 2018-10-07 MED ORDER — FENTANYL CITRATE (PF) 100 MCG/2ML IJ SOLN
INTRAMUSCULAR | Status: AC
  Filled 2018-10-07: qty 2

## 2018-10-07 MED ORDER — METHYLPREDNISOLONE SODIUM SUCC 125 MG IJ SOLR: 125.0000 mg | Freq: Once | INTRAMUSCULAR | Status: DC | PRN

## 2018-10-07 MED ORDER — FENTANYL CITRATE (PF) 100 MCG/2ML IJ SOLN
INTRAMUSCULAR | Status: DC | PRN
  Administered 2018-10-07: 12.5 ug via INTRAVENOUS
  Administered 2018-10-07: 50 ug via INTRAVENOUS
  Administered 2018-10-07: 25 ug via INTRAVENOUS

## 2018-10-07 MED ORDER — CEPHALEXIN 500 MG PO CAPS
500.0000 mg | ORAL_CAPSULE | Freq: Two times a day (BID) | ORAL | Status: DC
  Administered 2018-10-07 – 2018-10-08 (×2): 500 mg via ORAL
  Filled 2018-10-07 (×2): qty 1

## 2018-10-07 MED ORDER — MIDAZOLAM HCL 5 MG/5ML IJ SOLN
INTRAMUSCULAR | Status: AC
  Filled 2018-10-07: qty 5

## 2018-10-07 MED ORDER — DIPHENHYDRAMINE HCL 50 MG/ML IJ SOLN: 50.0000 mg | Freq: Once | INTRAMUSCULAR | Status: DC | PRN

## 2018-10-07 MED ORDER — METOPROLOL SUCCINATE ER 25 MG PO TB24
25.0000 mg | ORAL_TABLET | Freq: Every day | ORAL | Status: DC
  Administered 2018-10-08: 25 mg via ORAL
  Filled 2018-10-07: qty 1

## 2018-10-07 MED ORDER — ONDANSETRON HCL 4 MG/2ML IJ SOLN: 4.0000 mg | Freq: Four times a day (QID) | INTRAMUSCULAR | Status: DC | PRN

## 2018-10-07 MED ORDER — SODIUM CHLORIDE 0.9 % IV SOLN: INTRAVENOUS | Status: DC

## 2018-10-07 MED ORDER — POTASSIUM CHLORIDE CRYS ER 20 MEQ PO TBCR
20.0000 meq | EXTENDED_RELEASE_TABLET | Freq: Two times a day (BID) | ORAL | Status: DC
  Administered 2018-10-07 – 2018-10-08 (×2): 20 meq via ORAL
  Filled 2018-10-07 (×2): qty 1

## 2018-10-07 MED ORDER — HYDROMORPHONE HCL 1 MG/ML IJ SOLN: 1.0000 mg | Freq: Once | INTRAMUSCULAR | Status: DC | PRN

## 2018-10-07 MED ORDER — HEPARIN SODIUM (PORCINE) 1000 UNIT/ML IJ SOLN
INTRAMUSCULAR | Status: DC | PRN
  Administered 2018-10-07: 5000 [IU] via INTRAVENOUS

## 2018-10-07 MED ORDER — CLINDAMYCIN PHOSPHATE 300 MG/50ML IV SOLN
INTRAVENOUS | Status: AC
  Filled 2018-10-07: qty 50

## 2018-10-07 MED ORDER — HEPARIN SODIUM (PORCINE) 1000 UNIT/ML IJ SOLN
INTRAMUSCULAR | Status: AC
  Filled 2018-10-07: qty 1

## 2018-10-07 MED ORDER — IODIXANOL 320 MG/ML IV SOLN
INTRAVENOUS | Status: DC | PRN
  Administered 2018-10-07: 09:00:00 80 mL

## 2018-10-07 MED ORDER — MIDAZOLAM HCL 2 MG/2ML IJ SOLN
INTRAMUSCULAR | Status: DC | PRN
  Administered 2018-10-07: 2 mg via INTRAVENOUS
  Administered 2018-10-07 (×2): 1 mg via INTRAVENOUS

## 2018-10-07 MED ORDER — FAMOTIDINE 20 MG PO TABS: 40.0000 mg | ORAL_TABLET | Freq: Once | ORAL | Status: DC | PRN

## 2018-10-07 MED ORDER — ASPIRIN EC 81 MG PO TBEC
81.0000 mg | DELAYED_RELEASE_TABLET | Freq: Every day | ORAL | Status: DC
  Administered 2018-10-07 – 2018-10-08 (×2): 81 mg via ORAL
  Filled 2018-10-07 (×2): qty 1

## 2018-10-07 MED ORDER — MIDAZOLAM HCL 2 MG/ML PO SYRP: 8.0000 mg | ORAL_SOLUTION | Freq: Once | ORAL | Status: DC | PRN

## 2018-10-07 SURGICAL SUPPLY — 21 items
BALLN LUTONIX DCB 5X80X130 (BALLOONS) ×3
BALLN ULTRVRSE 018 2.5X100X150 (BALLOONS) ×3
BALLOON LUTONIX DCB 5X80X130 (BALLOONS) ×1 IMPLANT
BALLOON ULTRVS 018 2.5X100X150 (BALLOONS) ×1 IMPLANT
CATH BEACON 5 .035 65 RIM TIP (CATHETERS) ×3 IMPLANT
CATH BEACON 5 .038 100 VERT TP (CATHETERS) ×3 IMPLANT
CATH GLIDE EXCHANG 5FR (CATHETERS) ×3 IMPLANT
CATH PIG 70CM (CATHETERS) ×3 IMPLANT
CATH VS15FR (CATHETERS) ×3 IMPLANT
DEVICE PRESTO INFLATION (MISCELLANEOUS) ×3 IMPLANT
DEVICE STARCLOSE SE CLOSURE (Vascular Products) ×3 IMPLANT
DEVICE TORQUE .025-.038 (MISCELLANEOUS) ×3 IMPLANT
GLIDEWIRE ADV .035X260CM (WIRE) ×3 IMPLANT
GUIDEWIRE ANGLED .035X260CM (WIRE) ×3 IMPLANT
PACK ANGIOGRAPHY (CUSTOM PROCEDURE TRAY) ×3 IMPLANT
SHEATH BRITE TIP 5FRX11 (SHEATH) ×3 IMPLANT
SHEATH RAABE 6FRX70 (SHEATH) ×3 IMPLANT
SYR MEDRAD MARK 7 150ML (SYRINGE) ×3 IMPLANT
TUBING CONTRAST HIGH PRESS 72 (TUBING) ×3 IMPLANT
WIRE G V18X300CM (WIRE) ×3 IMPLANT
WIRE J 3MM .035X145CM (WIRE) ×3 IMPLANT

## 2018-10-07 NOTE — Op Note (Signed)
[facility]  Percutaneous Study/Intervention Procedural Note   Date of Surgery: 10/07/2018  Surgeon(s):Makensey Rego    Assistants:none  Pre-operative Diagnosis: PAD with ulceration bilateral lower extremities  Post-operative diagnosis:  Same  Procedure(s) Performed:             1.  Ultrasound guidance for vascular access right femoral artery             2.  Catheter placement into left SFA from right femoral approach             3.  Aortogram and selective left lower extremity angiogram             4.  Percutaneous transluminal angioplasty of left popliteal artery with 5 mm diameter by 8 cm length Lutonix drug-coated angioplasty balloon             5.   Percutaneous transluminal angioplasty of the left posterior tibial artery with a 2.5 mm diameter by 10 cm length angioplasty balloon  6.  StarClose closure device right femoral artery  EBL: 10 cc  Contrast: 80 cc  Fluoro Time: 11 minutes  Moderate Conscious Sedation Time: approximately 35 minutes using 4 mg of Versed and 87.5 mcg of Fentanyl              Indications:  Patient is a 84 y.o.female with nonhealing wounds of both lower extremities, worse on the left.  Noninvasive studies at our institution are highly unreliable. The patient is brought in for angiography for further evaluation and potential treatment.  Due to the limb threatening nature of the situation, angiogram was performed for attempted limb salvage. The patient is aware that if the procedure fails, amputation would be expected.  The patient also understands that even with successful revascularization, amputation may still be required due to the severity of the situation.  Risks and benefits are discussed and informed consent is obtained.   Procedure:  The patient was identified and appropriate procedural time out was performed.  The patient was then placed supine on the table and prepped and draped in the usual sterile fashion. Moderate conscious  sedation was administered during a face to face encounter with the patient throughout the procedure with my supervision of the RN administering medicines and monitoring the patient's vital signs, pulse oximetry, telemetry and mental status throughout from the start of the procedure until the patient was taken to the recovery room. Ultrasound was used to evaluate the right common femoral artery.  It was patent .  A digital ultrasound image was acquired.  A Seldinger needle was used to access the right common femoral artery under direct ultrasound guidance and a permanent image was performed.  A 0.035 J wire was advanced without resistance and a 5Fr sheath was placed.  Pigtail catheter was placed into the aorta and an AP aortogram was performed. This demonstrated that the aorta and iliac arteries were markedly tortuous but not stenotic.  The renal arteries were patent without significant stenosis. I then crossed the aortic bifurcation using a V S1 catheter and a Glidewire then exchanging for a glide catheter to get down into the SFA to perform imaging.  Due to the difficulty crossing the bifurcation with her marked tortuosity, we would image the common femoral artery and external iliac artery towards the end of the procedure so we did not lose our purchase.  Selective left lower extremity angiogram was then performed. This demonstrated calcific but not stenotic superficial femoral artery.  The popliteal  artery had about a 75 to 80% stenosis just above the knee.  There was then a typical tibial trifurcation with the posterior tibial artery being the best runoff distally, but having a high-grade stenosis of about 90% in the distal posterior tibial artery.  The peroneal artery and anterior tibial arteries were small but did provide additional flow distally without obvious focal stenosis. It was felt that it was in the patient's best interest to proceed with intervention after these images to avoid a second procedure and  a larger amount of contrast and fluoroscopy based off of the findings from the initial angiogram. The patient was systemically heparinized and a 6 French 70 cm sheath was then placed over the Terumo Advantage wire. I then used a Kumpe catheter and the advantage wire to navigate through the popliteal stenosis and down into the posterior tibial artery.  We exchanged for a V 18 wire and parked this wire in the foot and the distal posterior tibial artery after selective injection with the Kumpe catheter in the posterior tibial artery confirm the suspected high-grade stenosis in the distal posterior tibial artery.  The popliteal lesion was treated with a 5 mm diameter by 8 cm length Lutonix drug-coated angioplasty balloon inflated to 10 atm for 1 minute.  The distal posterior tibial artery lesion was treated with a 2.5 mm diameter by 10 cm length angioplasty balloon inflated to 8 atm for 1 minute.  Completion imaging showed a roughly 10 to 15% residual stenosis in the distal posterior tibial artery although there was spasm now in the foot.  The popliteal lesion had only about a 10% residual stenosis after angioplasty.  Her flow was markedly improved.  I pulled the sheath back into the ipsilateral external iliac artery to image the common femoral artery, external iliac artery, and proximal SFA and no significant stenoses were seen in this location. I elected to terminate the procedure. The sheath was removed and StarClose closure device was deployed in the right femoral artery with excellent hemostatic result. The patient was taken to the recovery room in stable condition having tolerated the procedure well.  Findings:               Aortogram:  The aorta and iliac arteries were markedly tortuous but not stenotic.  The renal arteries were patent without significant stenosis.             Left lower Extremity:  This demonstrated calcific but not stenotic superficial femoral artery.  The popliteal artery had about a 75 to  80% stenosis just above the knee.  There was then a typical tibial trifurcation with the posterior tibial artery being the best runoff distally, but having a high-grade stenosis of about 90% in the distal posterior tibial artery.  The peroneal artery and anterior tibial arteries were small but did provide additional flow distally without obvious focal stenosis.   Disposition: Patient was taken to the recovery room in stable condition having tolerated the procedure well.  Complications: None  Leotis Pain 10/07/2018 9:22 AM   This note was created with Dragon Medical transcription system. Any errors in dictation are purely unintentional.

## 2018-10-07 NOTE — H&P (Signed)
 [facility] History & Physical Update  The patient was interviewed and re-examined.  The patient's previous History and Physical has been reviewed and is unchanged.  There is no change in the plan of care. We plan to proceed with the scheduled procedure.  Leotis Pain, MD  10/07/2018, 8:08 AM

## 2018-10-07 NOTE — Progress Notes (Signed)
PT Cancellation Note  Patient Details Name: [name] MRN: [id number] DOB: 09-Apr-1934   Cancelled Treatment:    Reason Eval/Treat Not Completed: Patient at procedure or test/unavailable.  Pt currently off unit for procedure.  Will re-attempt PT treatment session at a later date/time.  Leitha Bleak, PT 10/07/18, 8:17 AM [phone]

## 2018-10-07 NOTE — Progress Notes (Signed)
Patient became agitated and combative this morning, wanted to get out of bed to go home. Refuses to cooperate. Code 300 called, daughter called and spoke with patient, pt became clam and cooperative. Will continue to monitor.

## 2018-10-07 NOTE — Progress Notes (Signed)
Patient ID: Julia Gordon, female   DOB: 25-Oct-1934, 84 y.o.   MRN: [id number]  [facility] PROGRESS NOTE  Julia Gordon [id number] DOB: [birth year] DOA: 10/01/2018 PCP: Ellamae Sia, MD  HPI/Subjective: Patient seen after procedure and she was pretty lethargic.  Tried to answer few questions and was moving her extremities.  Objective: Vitals:   10/07/18 1126 10/07/18 1307  BP: (!) 118/95 133/87  Pulse: (!) 102 93  Resp: 20   Temp: (!) 97.5 F (36.4 C)   SpO2: 99%     Intake/Output Summary (Last 24 hours) at 10/07/2018 1533 Last data filed at 10/07/2018 1312 Gross per 24 hour  Intake 1874.49 ml  Output -  Net 1874.49 ml   Filed Weights   10/01/18 1540 10/01/18 2100 10/07/18 0713  Weight: 79.2 kg 80.1 kg 80.1 kg    ROS: Review of Systems  Unable to perform ROS: Acuity of condition   Exam: Physical Exam  Constitutional: She appears lethargic.  HENT:  Nose: No mucosal edema.  Mouth/Throat: No oropharyngeal exudate or posterior oropharyngeal edema.  Eyes: Pupils are equal, round, and reactive to light. Conjunctivae, EOM and lids are normal.  Neck: No JVD present. Carotid bruit is not present. No edema present. No thyroid mass and no thyromegaly present.  Cardiovascular: S1 normal and S2 normal. Exam reveals no gallop.  No murmur heard. Respiratory: No respiratory distress. She has no wheezes. She has no rhonchi. She has no rales.  GI: Soft. Bowel sounds are normal. There is no abdominal tenderness.  Musculoskeletal:     Right ankle: She exhibits swelling.     Left ankle: She exhibits swelling.  Lymphadenopathy:    She has no cervical adenopathy.  Neurological: She appears lethargic.  Skin: Skin is warm. Nails show no clubbing.  Numerous ulcers bilateral lower extremities.  The patient's most painful ulcer is on the left posterior leg.  Chronic lower extremity skin discoloration and slight warmth.  Psychiatric:  Lethargic today after procedure.      Data  Reviewed: Basic Metabolic Panel: Recent Labs  Lab 10/01/18 1554 10/03/18 0444 10/05/18 0559 10/06/18 0457 10/06/18 1154 10/07/18 0504  NA 140 141  --  140  --  140  K 3.5 3.1*  --  2.5* 3.2* 3.3*  CL 105 104  --  104  --  102  CO2 25 24  --  30  --  27  GLUCOSE 95 99  --  105*  --  117*  BUN 35* 32*  --  16  --  18  CREATININE 1.29* 1.28* 0.93 0.91  --  0.93  CALCIUM 8.7* 8.6*  --  7.9*  --  8.2*  MG  --   --   --   --   --  1.8   Liver Function Tests: Recent Labs  Lab 10/01/18 1554  AST 28  ALT 23  ALKPHOS 49  BILITOT 0.9  PROT 5.9*  ALBUMIN 2.9*    Recent Labs  Lab 10/01/18 2313  AMMONIA 27   CBC: Recent Labs  Lab 10/01/18 1554 10/03/18 0444 10/06/18 0457  WBC 6.6 8.1 8.7  NEUTROABS 4.7  --   --   HGB 13.5 13.6 13.9  HCT 41.3 40.9 42.4  MCV 92.6 91.7 91.2  PLT 166 158 165  BNP (last 3 results) Recent Labs    06/27/18 1931 09/25/18 1831  BNP 2,183.0* 1,771.0*    CBG: Recent Labs  Lab 10/01/18 1608  GLUCAP 73  Recent Results (from the past 240 hour(s))  Blood Culture (routine x 2)     Status: None   Collection Time: 10/01/18  3:54 PM   Specimen: BLOOD  Result Value Ref Range Status   Specimen Description BLOOD L EJ  Final   Special Requests   Final    BOTTLES DRAWN AEROBIC AND ANAEROBIC Blood Culture results may not be optimal due to an excessive volume of blood received in culture bottles   Culture   Final    NO GROWTH 5 DAYS Performed at [facility], [address] [place], [place] [address]    Report Status 10/06/2018 FINAL  Final  SARS Coronavirus 2 [facility] order, Performed in [place] hospital lab) Nasopharyngeal     Status: None   Collection Time: 10/01/18  5:29 PM   Specimen: Nasopharyngeal  Result Value Ref Range Status   SARS Coronavirus 2 NEGATIVE NEGATIVE Final    Comment: (NOTE) If result is NEGATIVE SARS-CoV-2 target nucleic acids are NOT DETECTED. The SARS-CoV-2 RNA is generally detectable in upper  and lower  respiratory specimens during the acute phase of infection. The lowest  concentration of SARS-CoV-2 viral copies this assay can detect is 250  copies / mL. A negative result does not preclude SARS-CoV-2 infection  and should not be used as the sole basis for treatment or other  patient management decisions.  A negative result may occur with  improper specimen collection / handling, submission of specimen other  than nasopharyngeal swab, presence of viral mutation(s) within the  areas targeted by this assay, and inadequate number of viral copies  (<250 copies / mL). A negative result must be combined with clinical  observations, patient history, and epidemiological information. If result is POSITIVE SARS-CoV-2 target nucleic acids are DETECTED. The SARS-CoV-2 RNA is generally detectable in upper and lower  respiratory specimens dur ing the acute phase of infection.  Positive  results are indicative of active infection with SARS-CoV-2.  Clinical  correlation with patient history and other diagnostic information is  necessary to determine patient infection status.  Positive results do  not rule out bacterial infection or co-infection with other viruses. If result is PRESUMPTIVE POSTIVE SARS-CoV-2 nucleic acids MAY BE PRESENT.   A presumptive positive result was obtained on the submitted specimen  and confirmed on repeat testing.  While 2019 novel coronavirus  (SARS-CoV-2) nucleic acids may be present in the submitted sample  additional confirmatory testing may be necessary for epidemiological  and / or clinical management purposes  to differentiate between  SARS-CoV-2 and other Sarbecovirus currently known to infect humans.  If clinically indicated additional testing with an alternate test  methodology [phone]) is advised. The SARS-CoV-2 RNA is generally  detectable in upper and lower respiratory sp ecimens during the acute  phase of infection. The expected result is  Negative. Fact Sheet for Patients:  StrictlyIdeas.no Fact Sheet for Healthcare Providers: BankingDealers.co.za This test is not yet approved or cleared by the [place] FDA and has been authorized for detection and/or diagnosis of SARS-CoV-2 by FDA under an Emergency Use Authorization (EUA).  This EUA will remain in effect (meaning this test can be used) for the duration of the COVID-19 declaration under Section 564(b)(1) of the Act, 21 U.S.C. section 360bbb-3(b)(1), unless the authorization is terminated or revoked sooner. Performed at [facility], [place], [place] [address]   SARS CORONAVIRUS 2 (TAT 6-24 HRS) Nasopharyngeal Nasopharyngeal Swab     Status: None  Collection Time: 10/06/18 11:48 AM   Specimen: Nasopharyngeal Swab  Result Value Ref Range Status   SARS Coronavirus 2 NEGATIVE NEGATIVE Final    Comment: (NOTE) SARS-CoV-2 target nucleic acids are NOT DETECTED. The SARS-CoV-2 RNA is generally detectable in upper and lower respiratory specimens during the acute phase of infection. Negative results do not preclude SARS-CoV-2 infection, do not rule out co-infections with other pathogens, and should not be used as the sole basis for treatment or other patient management decisions. Negative results must be combined with clinical observations, patient history, and epidemiological information. The expected result is Negative. Fact Sheet for Patients: SugarRoll.be Fact Sheet for Healthcare Providers: https://www.woods-mathews.com/ This test is not yet approved or cleared by the [place] FDA and  has been authorized for detection and/or diagnosis of SARS-CoV-2 by FDA under an Emergency Use Authorization (EUA). This EUA will remain  in effect (meaning this test can be used) for the duration of the COVID-19 declaration under Section 56 4(b)(1) of the Act, 21  U.S.C. section 360bbb-3(b)(1), unless the authorization is terminated or revoked sooner. Performed at [facility], [place] [address] [place], [place] [address]       Scheduled Meds: . aspirin EC  81 mg Oral Daily  . atorvastatin  40 mg Oral q1800  . cephALEXin  500 mg Oral Q12H  . Chlorhexidine Gluconate Cloth  6 each Topical Daily  . fentaNYL      . furosemide  40 mg Oral BID  . heparin      . mouth rinse  15 mL Mouth Rinse BID  . metoprolol succinate  12.5 mg Oral Daily  . midazolam      . potassium chloride  20 mEq Oral BID  . potassium chloride  40 mEq Oral Once  . Rivaroxaban  15 mg Oral Q supper  . sacubitril-valsartan  1 tablet Oral BID   Continuous Infusions: . clindamycin      Assessment/Plan:  1. Stroke left frontal lobe age-indeterminate.  Carotid ultrasound negative.  Echocardiogram shows an ejection fraction less than 20%.  On Xarelto and aspirin was added neurology consult appreciated.  Physical therapy recommended rehab.  2. Acute metabolic encephalopathy.  Lethargic today after procedure.  Reevaluate tomorrow.   Daughter would like her to follow-up as outpatient with her memory. 3. Hypokalemia.  Replace potassium orally 4. Peripheral vascular disease with bilateral lower extremity weeping wounds.  Continue Lasix.  Switch Ancef over to Keflex.  Status post angiogram and angioplasties today by Dr. Lucky Cowboy. 5. Chronic systolic congestive heart failure.  On low-dose Entresto, titrate up Toprol, Lasix.  Consider Aldactone as outpatient.  Would rather not start so many medications at once. 6. Right lower extremity DVT on Xarelto. 7. Chronic atrial fibrillation on Xarelto and metoprolol. 8. Chronic kidney disease stage III. 9. Hyperlipidemia lovastatin switched to Lipitor.  Code Status:     Code Status Orders  (From admission, onward)         Start     Ordered   10/01/18 2142  Full code  Continuous     10/01/18 2141        Code Status History    Date  Active Date Inactive Code Status Order ID Comments User Context   06/27/2018 2303 06/30/2018 1504 Full Code [id number]  Mayer Camel, NP ED   07/20/2017 0231 07/23/2017 1926 Full Code [id number]  Lance Coon, MD Inpatient   Advance Care Planning Activity     Family Communication: Spoke with daughter  on the phone Disposition Plan: Since lethargic today I will reevaluate tomorrow morning for discharge to rehab.  Antibiotics:  Keflex.  Time spent: 27 minutes  [place]

## 2018-10-08 ENCOUNTER — Encounter: Payer: Self-pay | Admitting: Vascular Surgery

## 2018-10-08 DIAGNOSIS — I70243 Atherosclerosis of native arteries of left leg with ulceration of ankle: Secondary | ICD-10-CM

## 2018-10-08 LAB — CBC
HCT: 38.6 % (ref 36.0–46.0)
Hemoglobin: 12.6 g/dL (ref 12.0–15.0)
MCH: 30.3 pg (ref 26.0–34.0)
MCHC: 32.6 g/dL (ref 30.0–36.0)
MCV: 92.8 fL (ref 80.0–100.0)
Platelets: 179 10*3/uL (ref 150–400)
RBC: 4.16 MIL/uL (ref 3.87–5.11)
RDW: 15.8 % — ABNORMAL HIGH (ref 11.5–15.5)
WBC: 7.9 10*3/uL (ref 4.0–10.5)
nRBC: 0 % (ref 0.0–0.2)

## 2018-10-08 LAB — BASIC METABOLIC PANEL
Anion gap: 7 (ref 5–15)
BUN: 20 mg/dL (ref 8–23)
CO2: 29 mmol/L (ref 22–32)
Calcium: 8.1 mg/dL — ABNORMAL LOW (ref 8.9–10.3)
Chloride: 104 mmol/L (ref 98–111)
Creatinine, Ser: 0.8 mg/dL (ref 0.44–1.00)
GFR calc Af Amer: >60 mL/min (ref >60)
GFR calc non Af Amer: >60 mL/min (ref >60)
Glucose, Bld: 134 mg/dL — ABNORMAL HIGH (ref 70–99)
Potassium: 3.2 mmol/L — ABNORMAL LOW (ref 3.5–5.1)
Sodium: 140 mmol/L (ref 135–145)

## 2018-10-08 MED ORDER — ATORVASTATIN CALCIUM 40 MG PO TABS: 40.0000 mg | ORAL_TABLET | Freq: Every day | ORAL | 0 refills | Status: AC

## 2018-10-08 MED ORDER — TRAMADOL HCL 50 MG PO TABS: 50.0000 mg | ORAL_TABLET | Freq: Four times a day (QID) | ORAL | 0 refills | Status: DC | PRN

## 2018-10-08 MED ORDER — METOPROLOL SUCCINATE ER 25 MG PO TB24: 25.0000 mg | ORAL_TABLET | Freq: Every day | ORAL | 0 refills | Status: DC

## 2018-10-08 MED ORDER — CEPHALEXIN 500 MG PO CAPS: 500.0000 mg | ORAL_CAPSULE | Freq: Two times a day (BID) | ORAL | 0 refills | Status: AC

## 2018-10-08 MED ORDER — METOPROLOL SUCCINATE ER 25 MG PO TB24
25.0000 mg | ORAL_TABLET | ORAL | Status: AC
  Administered 2018-10-08: 25 mg via ORAL
  Filled 2018-10-08: qty 1

## 2018-10-08 MED ORDER — METOPROLOL SUCCINATE ER 50 MG PO TB24: 50.0000 mg | ORAL_TABLET | Freq: Every day | ORAL | Status: DC

## 2018-10-08 MED ORDER — ACETAMINOPHEN 325 MG PO TABS: 650.0000 mg | ORAL_TABLET | Freq: Four times a day (QID) | ORAL | Status: AC | PRN

## 2018-10-08 MED ORDER — ASPIRIN 81 MG PO TBEC: 81.0000 mg | DELAYED_RELEASE_TABLET | Freq: Every day | ORAL | 0 refills | Status: AC

## 2018-10-08 MED ORDER — SENNOSIDES-DOCUSATE SODIUM 8.6-50 MG PO TABS: 1.0000 | ORAL_TABLET | Freq: Every evening | ORAL | 0 refills | Status: AC | PRN

## 2018-10-08 MED ORDER — SODIUM CHLORIDE 0.9% FLUSH
10.0000 mL | INTRAVENOUS | Status: AC | PRN
  Administered 2018-10-08: 10 mL

## 2018-10-08 MED ORDER — HEPARIN SOD (PORK) LOCK FLUSH 100 UNIT/ML IV SOLN
500.0000 [IU] | INTRAVENOUS | Status: AC | PRN
  Administered 2018-10-08: 17:00:00 500 [IU]
  Filled 2018-10-08: qty 5

## 2018-10-08 MED ORDER — METOPROLOL SUCCINATE ER 50 MG PO TB24: 50.0000 mg | ORAL_TABLET | Freq: Every day | ORAL | 6 refills | Status: DC

## 2018-10-08 NOTE — Progress Notes (Signed)
Patient ID: Julia Gordon, female   DOB: [birth year], 84 y.o.   MRN: [id number]  Lungs are clear upon discharge.  Came back to listen to the patient's lungs because the patient had a low pulse ox.  Likely the low pulse ox is secondary to poor circulation to the extremities.  Continue oxygen 2 L nasal cannula at facility.  Prior to discharge from the facility recommend checking pulse ox on room air with ambulation to see if she still needs oxygen.  Patient can be discharged to rehab  Dr Loletha Grayer

## 2018-10-08 NOTE — Discharge Summary (Addendum)
[facility] NAME: Julia Gordon    MR#:  [id number]  DATE OF BIRTH:  [birth year]  DATE OF ADMISSION:  10/01/2018 ADMITTING PHYSICIAN: Sela Hua, MD  DATE OF DISCHARGE: 10/08/2018  PRIMARY CARE PHYSICIAN: Ellamae Sia, MD    ADMISSION DIAGNOSIS:  Somnolence [R40.0] Wound of left lower extremity, initial encounter [S81.802A] Stroke ([place]) [I63.9]  DISCHARGE DIAGNOSIS:  Active Problems:   Somnolence   Stroke ([place])   Pressure injury of skin   SECONDARY DIAGNOSIS:   Past Medical History:  Diagnosis Date  . Anemia   . Breast cancer ([place]) 2015   a. L mastectomy with chemoradiation  . Colon adenocarcinoma ([place])   . COPD (chronic obstructive pulmonary disease) ([place])   . Depression   . DJD (degenerative joint disease) of knee    right knee  . DVT (deep venous thrombosis) ([place])    a. 06/2018 L popliteal and R peroneal DVTs-->Xarelto.  . H/O hysterectomy with oophorectomy    for DUB  . HFrEF (heart failure with reduced ejection fraction) ([place])    a. 06/2018 Echo: EF 25-30%.  . History of depression   . HLD (hyperlipidemia)   . HTN (hypertension)   . LV (left ventricular) mural thrombus    history of, resolved (echo 04/09)  . NICM (nonischemic cardiomyopathy) ([place])    a. Reports prior h/o cath @ [facility]; b. status post [place] with [employer] lead in 2009 at [facility], Dr. Boyd Kerbs; c. 2014 - prev seen by G. Lovena Le, MD; c. 06/2018 Echo: EF 25-30%, DD. Diff HK. RVSP 47mmHg. Mildly dil RA. Mild to mod MR. Mod TR. Mild AI.  Marland Kitchen Persistent atrial fibrillation ([place])    a. noted in [place] from 2008-2009; b. CHADS2VASc 6 (CHF, HTN, age x 2, vascular disease, female)-->eliquis added 06/2018 in setting of LLE DVT.  . Valvular heart disease    a. 06/2018 Echo: Mod TR, mild to mod MR, mild AI.    HOSPITAL COURSE:   1.  Left frontal lobe stroke age indeterminate.  Echocardiogram shows an ejection fraction less than 20%, carotid  ultrasound negative.  Patient on aspirin and Xarelto.  Patient seen in consultation by neurology.  Physical therapy recommends rehab. 2.  Periods of acute metabolic encephalopathy during the hospital course.  Patient will have to follow-up for her memory as outpatient.  Patient has had periods of confusion during the hospital course. 3.  Peripheral vascular disease with bilateral lower extremity wounds.  Patient was given Ancef during the hospital course and I switch over to Keflex for 5 more doses upon going home.  Status post angiogram and angioplasties yesterday by Dr. do on the left leg.  Will need follow-up as outpatient and likely elective procedure on the right leg as outpatient.  Patient on Xarelto and aspirin. 4.  Chronic systolic congestive heart failure.  With EF less than 20%.  I added low-dose [name] which is a new medication.  I titrated up metoprolol to 50 mg daily.  Continue Lasix orally.  With creatinine borderline during the hospital course and patient receiving IV contrast I will hold off on Aldactone at this time. 5.  Hypokalemia.  Continue potassium replacement daily.  Check a BMP weekly 6.  Right lower extremity DVT on Xarelto 7.  Chronic atrial fibrillation on Xarelto and metoprolol.  This morning had increased heart rate with physicial therapy.  Will go back up to the 50mg  Toprol dose and give the  extra 25 mg this am. 8.  Acute kidney injury on chronic kidney disease stage II.  Creatinine was elevated at 1.29 and improved to 0.80. 9.  Hyperlipidemia.  Lovastatin switched over to Lipitor.  LDL 79 and goal less than 70. 10.  Acute on chronic hypoxic respiratory failure.  The patient has been on and off oxygen during the hospital course.  Pulse ox has been variable probably because of the patient's poor circulation.  Patient was low on pulse ox 84%.  Patient qualifies for oxygen at this time.  2 L of oxygen prescribed.  Prior from discharge from rehab would check to see if she still  qualifies for oxygen by checking a pulse ox with ambulation on room air.  DISCHARGE CONDITIONS:   Satisfactory  CONSULTS OBTAINED:  Treatment Team:  Catarina Hartshorn, MD  DRUG ALLERGIES:   Allergies  Allergen Reactions  . No Known Allergies     DISCHARGE MEDICATIONS:   Allergies as of 10/08/2018      Reactions   No Known Allergies       Medication List    STOP taking these medications   lovastatin 40 MG tablet Commonly known as: MEVACOR     TAKE these medications   acetaminophen 325 MG tablet Commonly known as: TYLENOL Take 2 tablets (650 mg total) by mouth every 6 (six) hours as needed for mild pain (or temp > 37.5 C (99.5 F)).   alendronate 70 MG tablet Commonly known as: FOSAMAX TAKE 1 TAB WEEKLY 30 MINS BEFORE BREAKFAST WITH A FULL GLASS OF WATER. DO NOT LIE DOWN FOR 30 MINS   aspirin 81 MG EC tablet Take 1 tablet (81 mg total) by mouth daily.   atorvastatin 40 MG tablet Commonly known as: LIPITOR Take 1 tablet (40 mg total) by mouth daily at 6 PM.   cephALEXin 500 MG capsule Commonly known as: KEFLEX Take 1 capsule (500 mg total) by mouth every 12 (twelve) hours for 5 doses.   furosemide 20 MG tablet Commonly known as: LASIX Take 40 mg (2 tablets) by mouth every morning, take 1 tablet (20 mg total) once in the afternoon.   metoprolol succinate 50 MG 24 hr tablet Commonly known as: TOPROL-XL Take 1 tablet (50 mg total) by mouth daily.   potassium chloride SA 20 MEQ tablet Commonly known as: KLOR-CON Take 1 tablet (20 mEq total) by mouth daily.   Rivaroxaban 15 MG Tabs tablet Commonly known as: XARELTO Take 1 tablet (15 mg total) by mouth daily with supper.   sacubitril-valsartan 24-26 MG Commonly known as: ENTRESTO Take 1 tablet by mouth 2 (two) times daily.   senna-docusate 8.6-50 MG tablet Commonly known as: Senokot-S Take 1 tablet by mouth at bedtime as needed for mild constipation or moderate constipation.   traMADol 50 MG  tablet Commonly known as: Ultram Take 1 tablet (50 mg total) by mouth every 6 (six) hours as needed.        DISCHARGE INSTRUCTIONS:   Follow-up team at rehab 1 day Follow-up with Dr. Lucky Cowboy vascular surgery 1 week Follow-up wound care center 2 weeks  If you experience worsening of your admission symptoms, develop shortness of breath, life threatening emergency, suicidal or homicidal thoughts you must seek medical attention immediately by calling 911 or calling your MD immediately  if symptoms less severe.  You Must read complete instructions/literature along with all the possible adverse reactions/side effects for all the Medicines you take and that have been prescribed to you. Take any  new Medicines after you have completely understood and accept all the possible adverse reactions/side effects.   Please note  You were cared for by a hospitalist during your hospital stay. If you have any questions about your discharge medications or the care you received while you were in the hospital after you are discharged, you can call the unit and asked to speak with the hospitalist on call if the hospitalist that took care of you is not available. Once you are discharged, your primary care physician will handle any further medical issues. Please note that NO REFILLS for any discharge medications will be authorized once you are discharged, as it is imperative that you return to your primary care physician (or establish a relationship with a primary care physician if you do not have one) for your aftercare needs so that they can reassess your need for medications and monitor your lab values.    Today   CHIEF COMPLAINT:   Chief Complaint  Patient presents with  . Wound Infection    HISTORY OF PRESENT ILLNESS:  Timmy Delahoussaye  is a 84 y.o. female came in with painful wounds over lower extremity   VITAL SIGNS:  Blood pressure (!) 122/99, pulse 88, temperature 98 F (36.7 C), temperature source Oral,  resp. rate 16, height 5\' 2"  (1.575 m), weight 80.1 kg, SpO2 97 %.  I/O:    Intake/Output Summary (Last 24 hours) at 10/08/2018 1201 Last data filed at 10/08/2018 1025 Gross per 24 hour  Intake 1155 ml  Output -  Net 1155 ml    PHYSICAL EXAMINATION:  GENERAL:  84 y.o.-year-old patient lying in the bed with no acute distress.  EYES: Pupils equal, round, reactive to light and accommodation. No scleral icterus. Extraocular muscles intact.  HEENT: Head atraumatic, normocephalic. Oropharynx and nasopharynx clear.  NECK:  Supple, no jugular venous distention. No thyroid enlargement, no tenderness.  LUNGS: Normal breath sounds bilaterally, no wheezing, rales,rhonchi or crepitation. No use of accessory muscles of respiration.  CARDIOVASCULAR: S1, S2 irregular regular.  2 out of 6 systolic murmurs, no rubs, or gallops.  ABDOMEN: Soft, non-tender, non-distended. Bowel sounds present. No organomegaly or mass.  EXTREMITIES: No pedal edema, cyanosis, or clubbing.  NEUROLOGIC: Cranial nerves II through XII are intact. Muscle strength 5/5 in all extremities. Sensation intact. Gait not checked.  PSYCHIATRIC: The patient is alert and answers questions.  SKIN: Chronic bilateral lower extremity wounds.  1 of the wounds on the anterior left shin is bleeding because the scab came off.  The other ones on the left lower extremity look like they are drying up and have scabs.  Right lower extremity does have some drying lower extremity scabbing lesions also.  DATA REVIEW:   CBC Recent Labs  Lab 10/08/18 0612  WBC 7.9  HGB 12.6  HCT 38.6  PLT 179    Chemistries  Recent Labs  Lab 10/01/18 1554  10/07/18 0504 10/08/18 0612  NA 140   < > 140 140  K 3.5   < > 3.3* 3.2*  CL 105   < > 102 104  CO2 25   < > 27 29  GLUCOSE 95   < > 117* 134*  BUN 35*   < > 18 20  CREATININE 1.29*   < > 0.93 0.80  CALCIUM 8.7*   < > 8.2* 8.1*  MG  --   --  1.8  --   AST 28  --   --   --  ALT 23  --   --   --    ALKPHOS 49  --   --   --   BILITOT 0.9  --   --   --    < > = values in this interval not displayed.    Microbiology Results  Results for orders placed or performed during the hospital encounter of 10/01/18  Blood Culture (routine x 2)     Status: None   Collection Time: 10/01/18  3:54 PM   Specimen: BLOOD  Result Value Ref Range Status   Specimen Description BLOOD L EJ  Final   Special Requests   Final    BOTTLES DRAWN AEROBIC AND ANAEROBIC Blood Culture results may not be optimal due to an excessive volume of blood received in culture bottles   Culture   Final    NO GROWTH 5 DAYS Performed at [facility], [address] [place], [place] [address]    Report Status 10/06/2018 FINAL  Final  SARS Coronavirus 2 [facility] order, Performed in [place] hospital lab) Nasopharyngeal     Status: None   Collection Time: 10/01/18  5:29 PM   Specimen: Nasopharyngeal  Result Value Ref Range Status   SARS Coronavirus 2 NEGATIVE NEGATIVE Final    Comment: (NOTE) If result is NEGATIVE SARS-CoV-2 target nucleic acids are NOT DETECTED. The SARS-CoV-2 RNA is generally detectable in upper and lower  respiratory specimens during the acute phase of infection. The lowest  concentration of SARS-CoV-2 viral copies this assay can detect is 250  copies / mL. A negative result does not preclude SARS-CoV-2 infection  and should not be used as the sole basis for treatment or other  patient management decisions.  A negative result may occur with  improper specimen collection / handling, submission of specimen other  than nasopharyngeal swab, presence of viral mutation(s) within the  areas targeted by this assay, and inadequate number of viral copies  (<250 copies / mL). A negative result must be combined with clinical  observations, patient history, and epidemiological information. If result is POSITIVE SARS-CoV-2 target nucleic acids are DETECTED. The SARS-CoV-2 RNA is generally detectable  in upper and lower  respiratory specimens dur ing the acute phase of infection.  Positive  results are indicative of active infection with SARS-CoV-2.  Clinical  correlation with patient history and other diagnostic information is  necessary to determine patient infection status.  Positive results do  not rule out bacterial infection or co-infection with other viruses. If result is PRESUMPTIVE POSTIVE SARS-CoV-2 nucleic acids MAY BE PRESENT.   A presumptive positive result was obtained on the submitted specimen  and confirmed on repeat testing.  While 2019 novel coronavirus  (SARS-CoV-2) nucleic acids may be present in the submitted sample  additional confirmatory testing may be necessary for epidemiological  and / or clinical management purposes  to differentiate between  SARS-CoV-2 and other Sarbecovirus currently known to infect humans.  If clinically indicated additional testing with an alternate test  methodology [phone]) is advised. The SARS-CoV-2 RNA is generally  detectable in upper and lower respiratory sp ecimens during the acute  phase of infection. The expected result is Negative. Fact Sheet for Patients:  StrictlyIdeas.no Fact Sheet for Healthcare Providers: BankingDealers.co.za This test is not yet approved or cleared by the [place] FDA and has been authorized for detection and/or diagnosis of SARS-CoV-2 by FDA under an Emergency Use Authorization (EUA).  This EUA will remain in effect (meaning this test can  be used) for the duration of the COVID-19 declaration under Section 564(b)(1) of the Act, 21 U.S.C. section 360bbb-3(b)(1), unless the authorization is terminated or revoked sooner. Performed at [facility], [place], [place] [address]   SARS CORONAVIRUS 2 (TAT 6-24 HRS) Nasopharyngeal Nasopharyngeal Swab     Status: None   Collection Time: 10/06/18 11:48 AM   Specimen: Nasopharyngeal  Swab  Result Value Ref Range Status   SARS Coronavirus 2 NEGATIVE NEGATIVE Final    Comment: (NOTE) SARS-CoV-2 target nucleic acids are NOT DETECTED. The SARS-CoV-2 RNA is generally detectable in upper and lower respiratory specimens during the acute phase of infection. Negative results do not preclude SARS-CoV-2 infection, do not rule out co-infections with other pathogens, and should not be used as the sole basis for treatment or other patient management decisions. Negative results must be combined with clinical observations, patient history, and epidemiological information. The expected result is Negative. Fact Sheet for Patients: SugarRoll.be Fact Sheet for Healthcare Providers: https://www.woods-mathews.com/ This test is not yet approved or cleared by the [place] FDA and  has been authorized for detection and/or diagnosis of SARS-CoV-2 by FDA under an Emergency Use Authorization (EUA). This EUA will remain  in effect (meaning this test can be used) for the duration of the COVID-19 declaration under Section 56 4(b)(1) of the Act, 21 U.S.C. section 360bbb-3(b)(1), unless the authorization is terminated or revoked sooner. Performed at [facility], [place] [address] [place], [place] [address]      Management plans discussed with the patient, family and they are in agreement.  CODE STATUS:     Code Status Orders  (From admission, onward)         Start     Ordered   10/01/18 2142  Full code  Continuous     10/01/18 2141        Code Status History    Date Active Date Inactive Code Status Order ID Comments User Context   06/27/2018 2303 06/30/2018 1504 Full Code [id number]  Mayer Camel, NP ED   07/20/2017 0231 07/23/2017 1926 Full Code [id number]  Lance Coon, MD Inpatient   Advance Care Planning Activity      TOTAL TIME TAKING CARE OF THIS PATIENT: 36 minutes.    Loletha Grayer M.D on 10/08/2018 at 12:01  PM  Between 7am to 6pm - Pager - [phone]  After 6pm go to [email or web address] - password [email or web address]  [employer] Office  [phone]  CC: Primary care physician; Ellamae Sia, MD

## 2018-10-08 NOTE — TOC Progression Note (Addendum)
Transition of Care [facility]) - Progression Note    Patient Details  Name: AMBREIA HOGANCAMP MRN: [id number] Date of Birth: 10/16/1934  Transition of [facility]) CM/SW [place], LCSW Phone Number: 10/08/2018, 8:55 AM  Clinical Narrative: Insurance authorization is still pending. [place] is requesting updated therapy notes. CSW sent message to PT and OT to see if they could work with her as soon as possible.     12:05 pm: Updated therapy notes have been sent to [facility] to fax to [facility]. CSW has updated patient's daughter.  2:45 pm: [facility] is requesting a peer to peer review by Monday October 5 at 9:30 am. CSW sent contact information to MD.  3:27 pm: Peer-to-peer review has been completed. Waiting on SNF to receive official authorization determination.  Expected Discharge Plan: [place] Barriers to Discharge: Continued Medical Work up  Expected Discharge Plan and Services Expected Discharge Plan: [employer] Choice: [place] arrangements for the past 2 months: Single Family Home Expected Discharge Date: 10/08/18                                     Social Determinants of Health (SDOH) Interventions    Readmission Risk Interventions Readmission Risk Prevention Plan 10/04/2018  Transportation Screening Complete  Social Work Consult for [place] Planning/Counseling [place] Not Applicable  Medication Review [employer]) Complete  Some recent data might be hidden

## 2018-10-08 NOTE — Plan of Care (Signed)
Got approval for pt to go to [facility].  Called report to nurse, Vaughan Basta. Explained that pt has poor circulation, an EF of <20% so sometimes it's hard to get a good pulse-ox reading. Use ear lobe for O2 sat.  BM today.  Dressings changed on veinous stasis ulcers on both legs.  EMS has been called for transport.

## 2018-10-08 NOTE — Progress Notes (Signed)
Occupational Therapy Treatment Patient Details Name: Julia Gordon MRN: [id number] DOB: [birth year] Today's Date: 10/08/2018    History of present illness Per neurologist assessment: Pt is an 84 y.o. female with a history of atrial fibrillation on Xarelto who presented lethargic.  Currently appears at baseline.  Alert.  Head CT reviewed and shows areas of hypodensity likely secondary to small vessel ischemic changes and chronic ischemic changes.  Patient unable to have MRI but on anticoagulation.  Carotid dopplers show no evidence of hemodynamically significant stenosis.  MD assessment also includes: Acute metabolic encephalopathy, BLE weeping wounds, chronic CHF, Right lower extremity DVT on Xarelto, chronic a-fib, CKD III, and HLD.  Pt s/p angio L LE 10/07/18.   OT comments  Pt seen for OT/PT co-tx this am. Pt received supine in bed. Pt denies pain and agreeable to OT/PT treatment session this date. Pt required min assist to complete sup>sit transfer to EOB. Pt agreeable and eager to get to room recliner this date, however with first attempt to complete functional mobility, she required +2 heavy assist with heavy posterior lean. She was unable to come into full standing position with assistance this date. Pt HR monitored throughout session. HR noted to increase significantly with standing attempts with maximum HR reading of 154 with exertion. HR decreased to 110's-120's with rest break and cues to PLB. Pt able to complete seated grooming tasks with HR stable in 110's throughout (see ADL section below for further detail). Further mobility attempts deferred this date. Pt assisted back to bed and nsg notified of need for new external catheter and HR during session. Per PT who checked on pt 15 min after session, pt HR returned to 104-106 at rest. Pt progressing toward goals and continues to benefit from skilled OT services to maximize return to PLOF and minimize risk of future falls, injury, caregiver burden,  and readmission. Will continue to follow POC. Discharge recommendation remains appropriate.    Follow Up Recommendations  SNF    Equipment Recommendations  3 in 1 bedside commode    Recommendations for Other Services      Precautions / Restrictions Precautions Precautions: Fall Precaution Comments: R chest port; B LE wounds Restrictions Weight Bearing Restrictions: No       Mobility Bed Mobility Overal bed mobility: Needs Assistance Bed Mobility: Supine to Sit;Sit to Supine     Supine to sit: Min assist Sit to supine: +2 for physical assistance;Mod assist;Min assist   General bed mobility comments: assist for R LE semi-supine to sit with increased effort/time to perform from pt; +2 assist sit to supine and to boost up in bed end of session using bed sheet  Transfers Overall transfer level: Needs assistance Equipment used: Rolling walker (2 wheeled) Transfers: Sit to/from Stand Sit to Stand: Mod assist;Max assist;+2 physical assistance         General transfer comment: x2 trials; pt with heavy posterior lean 1st trial; vc's and tactile cues for UE/LE placement; assist to initiate stand and maintain stand with RW use 1st trial; pt unable to come to full stand 2nd trial (deferred further attempts d/t elevated HR with activity)    Balance Overall balance assessment: Needs assistance Sitting-balance support: No upper extremity supported;Feet supported Sitting balance-Leahy Scale: Good Sitting balance - Comments: steady sitting reaching within and outside BOS to reach items for functional task.     Standing balance-Leahy Scale: Zero Standing balance comment: heavy posterior lean in standing with RW use and 2 assist  ADL either performed or assessed with clinical judgement   ADL Overall ADL's : Needs assistance/impaired     Grooming: Wash/dry face;Oral care;Sitting;Set up;Supervision/safety Grooming Details (indicate cue type and  reason): Pt completed grooming tasks while seated EOB this date. She demonstrates improved seated balance and is able to complete grooming tasks with no UE or back support this date. She continues to require set-up assist and min VC's for sequencing, but overall demonstrates functional UE use during these tasks.                             Functional mobility during ADLs: +2 for physical assistance;Moderate assistance;Maximal assistance General ADL Comments: Pt attempted functional mobility this date x2 but was limited by cardiopulmonary status.     Vision Baseline Vision/History: Wears glasses Wears Glasses: At all times Patient Visual Report: No change from baseline     Perception     Praxis      Cognition Arousal/Alertness: Awake/alert Behavior During Therapy: Impulsive Overall Cognitive Status: No family/caregiver present to determine baseline cognitive functioning                                 General Comments: pt appearing forgetful at times during session; inconsistent with following 1 step commands requiring vc's and tactile cues at times for re-direction. Mildly impulsive with mobility attempts. Regularly reaching for walker and attempting to stand requiring re-direction from therapists.        Exercises Other Exercises Other Exercises: Pt assisted with grooming and UB dressing while sitting EOB. OT provided set-up/supervision throughout session. Pt tolerated well and demonstrates good overall sitting balance during functional activities this date. Other Exercises: Pt assisted with bed mobility as well as attempts to complete functional transfer to room recliner x2.   Shoulder Instructions       General Comments      Pertinent Vitals/ Pain       Pain Assessment: No/denies pain Pain Intervention(s): Limited activity within patient's tolerance;Monitored during session;Repositioned  Home Living                                           Prior Functioning/Environment              Frequency  Min 2X/week        Progress Toward Goals  OT Goals(current goals can now be found in the care plan section)     Acute Rehab OT Goals Patient Stated Goal: To go home  Plan Discharge plan remains appropriate;Frequency remains appropriate    Co-evaluation    PT/OT/SLP Co-Evaluation/Treatment: Yes Reason for Co-Treatment: Complexity of the patient's impairments (multi-system involvement);For patient/therapist safety;To address functional/ADL transfers PT goals addressed during session: Mobility/safety with mobility;Strengthening/ROM;Balance OT goals addressed during session: ADL's and self-care;Strengthening/ROM;Proper use of Adaptive equipment and DME      AM-PAC OT "6 Clicks" Daily Activity     Outcome Measure   Help from another person eating meals?: A Little Help from another person taking care of personal grooming?: A Little Help from another person toileting, which includes using toliet, bedpan, or urinal?: Total Help from another person bathing (including washing, rinsing, drying)?: A Lot Help from another person to put on and taking off regular upper body clothing?: A Little Help from another person  to put on and taking off regular lower body clothing?: A Lot 6 Click Score: 14    End of Session Equipment Utilized During Treatment: Gait belt;Rolling walker  OT Visit Diagnosis: Other abnormalities of gait and mobility (R26.89);Muscle weakness (generalized) (M62.81)   Activity Tolerance Patient tolerated treatment well   Patient Left in bed;with call bell/phone within reach;with bed alarm set   Nurse Communication Other (comment)(Pt needs new external catheter.)        Time: [id number] OT Time Calculation (min): 27 min  Charges: OT General Charges $OT Visit: 1 Visit OT Treatments $Self Care/Home Management : 8-22 mins  Shara Blazing, M.S., OTR/L Ascom: [phone] 10/08/18,  11:50 AM

## 2018-10-08 NOTE — Progress Notes (Signed)
Physical Therapy Treatment Patient Details Name: Julia Gordon MRN: [id number] DOB: 1934-06-26 Today's Date: 10/08/2018    History of Present Illness Per neurologist assessment: Pt is an 84 y.o. female with a history of atrial fibrillation on Xarelto who presented lethargic.  Currently appears at baseline.  Alert.  Head CT reviewed and shows areas of hypodensity likely secondary to small vessel ischemic changes and chronic ischemic changes.  Patient unable to have MRI but on anticoagulation.  Carotid dopplers show no evidence of hemodynamically significant stenosis.  MD assessment also includes: Acute metabolic encephalopathy, BLE weeping wounds, chronic CHF, Right lower extremity DVT on Xarelto, chronic a-fib, CKD III, and HLD.  Pt s/p angio L LE 10/07/18.    PT Comments    Pt seen for PT/OT co-treat.  Min assist semi-supine to sit.  1st trial standing pt able to stand up to RW with max assist x2 but heavy posterior lean noted and pt's HR increasing to 130's to 143 bpm; pt sat on edge of bed to rest and decrease HR (fluctuating 110's to 120's) and then attempted to stand again; 2nd trial standing pt unable to get to full stand with 2 assist and walker use and pt's HR increased to 130's up to 154 bpm.  Further mobility deferred d/t this and pt assisted back to bed with 2 assist.  Pt's HR 104-106 bpm at rest about 15 minutes after session ended.  Nurse and SW notified of pt's session and pt's elevated HR with activity.  Pt appearing very motivated to participate in therapy during session.  Will continue to focus on strengthening and progressive functional mobility per pt tolerance.    Follow Up Recommendations  SNF     Equipment Recommendations  Rolling walker with 5" wheels;3in1 (PT);Wheelchair (measurements PT);Wheelchair cushion (measurements PT)    Recommendations for Other Services       Precautions / Restrictions Precautions Precautions: Fall Precaution Comments: R chest port; B LE  wounds Restrictions Weight Bearing Restrictions: No    Mobility  Bed Mobility Overal bed mobility: Needs Assistance Bed Mobility: Supine to Sit;Sit to Supine     Supine to sit: Min assist Sit to supine: +2 for physical assistance   General bed mobility comments: assist for R LE semi-supine to sit with increased effort/time to perform from pt; 2 assist sit to supine and to boost up in bed end of session using bed sheet  Transfers Overall transfer level: Needs assistance Equipment used: Rolling walker (2 wheeled) Transfers: Sit to/from Stand Sit to Stand: Mod assist;Max assist;+2 physical assistance         General transfer comment: x2 trials; pt with heavy posterior lean 1st trial; vc's and tactile cues for UE/LE placement; assist to initiate stand and maintain stand with RW use 1st trial; pt unable to come to full stand 2nd trial (deferred further attempts d/t elevated HR with activity)  Ambulation/Gait             General Gait Details: not able at this time d/t assist levels for standing and elevated HR with activity   Stairs             Wheelchair Mobility    Modified Rankin (Stroke Patients Only)       Balance Overall balance assessment: Needs assistance Sitting-balance support: No upper extremity supported;Feet supported Sitting balance-Leahy Scale: Good Sitting balance - Comments: steady sitting reaching within BOS     Standing balance-Leahy Scale: Zero Standing balance comment: heavy posterior lean in standing  with RW use and 2 assist                            Cognition Arousal/Alertness: Awake/alert Behavior During Therapy: Impulsive Overall Cognitive Status: No family/caregiver present to determine baseline cognitive functioning                                 General Comments: pt appearing forgetful at times during session; inconsistent with following 1 step commands requiring vc's and tactile cues at times       Exercises      General Comments   Nursing cleared pt for participation in physical therapy.  Pt agreeable to PT session.      Pertinent Vitals/Pain Pain Assessment: No/denies pain Pain Intervention(s): Limited activity within patient's tolerance;Monitored during session;Repositioned  O2 sats WFL on room air during session's activities.    Home Living                      Prior Function            PT Goals (current goals can now be found in the care plan section) Acute Rehab PT Goals Patient Stated Goal: To go home PT Goal Formulation: With patient Time For Goal Achievement: 10/15/18 Potential to Achieve Goals: Fair Progress towards PT goals: Progressing toward goals    Frequency    Min 2X/week      PT Plan      Co-evaluation PT/OT/SLP Co-Evaluation/Treatment: Yes Reason for Co-Treatment: Complexity of the patient's impairments (multi-system involvement);For patient/therapist safety;To address functional/ADL transfers;Necessary to address cognition/behavior during functional activity PT goals addressed during session: Mobility/safety with mobility;Balance;Strengthening/ROM;Proper use of DME OT goals addressed during session: ADL's and self-care;Strengthening/ROM;Proper use of Adaptive equipment and DME      AM-PAC PT "6 Clicks" Mobility   Outcome Measure  Help needed turning from your back to your side while in a flat bed without using bedrails?: A Little Help needed moving from lying on your back to sitting on the side of a flat bed without using bedrails?: A Little Help needed moving to and from a bed to a chair (including a wheelchair)?: Total Help needed standing up from a chair using your arms (e.g., wheelchair or bedside chair)?: Total Help needed to walk in hospital room?: Total Help needed climbing 3-5 steps with a railing? : Total 6 Click Score: 10    End of Session Equipment Utilized During Treatment: Gait belt Activity Tolerance: Treatment  limited secondary to medical complications (Comment)(Limited d/t HR elevation with activity) Patient left: in bed;with call bell/phone within reach;with bed alarm set;Other (comment)(B heels floating via pillow) Nurse Communication: Mobility status;Precautions;Other (comment)(Pt's HR status during session) PT Visit Diagnosis: Muscle weakness (generalized) (M62.81);Difficulty in walking, not elsewhere classified (R26.2);Pain Pain - Right/Left: Left Pain - part of body: Leg     Time: [id number] PT Time Calculation (min) (ACUTE ONLY): 27 min  Charges:  $Therapeutic Activity: 8-22 mins                    Leitha Bleak, PT 10/08/18, 11:07 AM [phone]

## 2018-10-08 NOTE — Progress Notes (Signed)
Physical Therapy Treatment Patient Details Name: Julia Gordon MRN: [id number] DOB: 1934-04-26 Today's Date: 10/08/2018    History of Present Illness Per neurologist assessment: Pt is an 84 y.o. female with a history of atrial fibrillation on Xarelto who presented lethargic.  Currently appears at baseline.  Alert.  Head CT reviewed and shows areas of hypodensity likely secondary to small vessel ischemic changes and chronic ischemic changes.  Patient unable to have MRI but on anticoagulation.  Carotid dopplers show no evidence of hemodynamically significant stenosis.  MD assessment also includes: Acute metabolic encephalopathy, BLE weeping wounds, chronic CHF, Right lower extremity DVT on Xarelto, chronic a-fib, CKD III, and HLD.  Pt s/p angio L LE 10/07/18.    PT Comments    PT received call from pt's nurse requesting PT to see pt again this morning per MD request d/t elevated HR concerns (d/t pt received cardiac meds just prior to PT session and felt pt's cardiac medication was not taking effect yet during therapy session and needed to see how pt's HR did once cardiac medication was working).   Pt sitting up on edge of bed upon PT and nurse arrival and pt requesting to toilet and nurse brought pt a BSC.  Max assist x2 stand pivot bed to/from [facility] (see below for details).  Pt's HR in 110's upon arrival and HR increased to 120's to low to mid 130's (up to 136 bpm) with activity but decreased back down to 110's bpm within a couple minutes of rest.  Will continue to focus on strengthening and progressive functional mobility per pt tolerance.   Follow Up Recommendations  SNF     Equipment Recommendations  Rolling walker with 5" wheels;3in1 (PT);Wheelchair (measurements PT);Wheelchair cushion (measurements PT)    Recommendations for Other Services       Precautions / Restrictions Precautions Precautions: Fall Precaution Comments: R chest port; B LE wounds Restrictions Weight Bearing Restrictions:  No    Mobility  Bed Mobility Overal bed mobility: Needs Assistance Bed Mobility: Sit to Supine     Sit to supine: +2 for physical assistance;Mod assist;Min assist   General bed mobility comments: 2 assist sit to supine and to boost up in bed end of session using bed sheet  Transfers Overall transfer level: Needs assistance Equipment used: None Transfers: Stand Pivot Transfers Stand pivot transfers: Max assist;+2 physical assistance       General transfer comment: x2 attempts to perform stand pivot to L bed to [facility] (pt initially resisting movement towards L with L UE pushing against BSC so sat pt back down on bed and trialed again with additional cueing but 2nd attempt pt grabbed onto bedrail with R UE resisting movement towards L requiring cueing to let go in order to transfer to [facility]); stand pivot to R BSC to bed; vc's for UE/LE positioning and technique required  Ambulation/Gait             General Gait Details: not appropriate at this time   Stairs             Wheelchair Mobility    Modified Rankin (Stroke Patients Only)       Balance Overall balance assessment: Needs assistance Sitting-balance support: No upper extremity supported;Feet supported Sitting balance-Leahy Scale: Good Sitting balance - Comments: steady sitting reaching within BOS     Standing balance-Leahy Scale: Zero Standing balance comment: heavy posterior lean in standing with RW use and 2 assist  Cognition Arousal/Alertness: Awake/alert Behavior During Therapy: Impulsive Overall Cognitive Status: No family/caregiver present to determine baseline cognitive functioning                                 General Comments: pt appearing forgetful at times during session; inconsistent with following 1 step commands requiring vc's and tactile cues at times for re-direction      Exercises     General Comments        Pertinent  Vitals/Pain Pain Assessment: No/denies pain Pain Intervention(s): Monitored during session;Repositioned    Home Living                      Prior Function            PT Goals (current goals can now be found in the care plan section) Acute Rehab PT Goals Patient Stated Goal: To go home PT Goal Formulation: With patient Time For Goal Achievement: 10/15/18 Potential to Achieve Goals: Fair Progress towards PT goals: Progressing toward goals    Frequency    Min 2X/week      PT Plan Current plan remains appropriate    Co-evaluation PT/OT/SLP Co-Evaluation/Treatment: Yes Reason for Co-Treatment: Complexity of the patient's impairments (multi-system involvement);For patient/therapist safety;To address functional/ADL transfers PT goals addressed during session: Mobility/safety with mobility;Strengthening/ROM;Balance OT goals addressed during session: ADL's and self-care;Strengthening/ROM;Proper use of Adaptive equipment and DME      AM-PAC PT "6 Clicks" Mobility   Outcome Measure  Help needed turning from your back to your side while in a flat bed without using bedrails?: A Little Help needed moving from lying on your back to sitting on the side of a flat bed without using bedrails?: A Little Help needed moving to and from a bed to a chair (including a wheelchair)?: Total Help needed standing up from a chair using your arms (e.g., wheelchair or bedside chair)?: Total Help needed to walk in hospital room?: Total Help needed climbing 3-5 steps with a railing? : Total 6 Click Score: 10    End of Session Equipment Utilized During Treatment: Gait belt Activity Tolerance: Patient tolerated treatment well Patient left: in bed;with call bell/phone within reach;with bed alarm set;Other (comment)(B heels floating via pillow) Nurse Communication: (nurse present during entire session) PT Visit Diagnosis: Muscle weakness (generalized) (M62.81);Difficulty in walking, not  elsewhere classified (R26.2);Pain Pain - Right/Left: Left Pain - part of body: Leg     Time: [id number] PT Time Calculation (min) (ACUTE ONLY): 25 min  Charges:  $Therapeutic Activity: 23-37 mins                     Leitha Bleak, PT 10/08/18, 1:39 PM [phone]

## 2018-10-08 NOTE — TOC Transition Note (Signed)
Transition of Care [facility]) - CM/SW Discharge Note   Patient Details  Name: Julia Gordon MRN: [id number] Date of Birth: 01-10-1934  [facility] of [facility]) CM/SW Contact:  Candie Chroman, LCSW Phone Number: 10/08/2018, 4:21 PM   Clinical Narrative: Patient has insurance approval to go to [facility] today. RN will call report to [phone] prior to setting up EMS transport. Patient and daughter been updated and daughter will take her [name] to the facility. No further concerns. CSW signing off.    Final next level of care: [place] Barriers to Discharge: Barriers Resolved   Patient Goals and CMS Choice        Discharge Placement   Existing PASRR number confirmed : 10/05/18          Patient chooses bed at: [facility] Patient to be transferred to facility by: EMS Name of family member notified: Curlene Dolphin Patient and family notified of of transfer: 10/08/18  Discharge Plan and Services     Post Acute Care Choice: Home Health                               Social Determinants of Health (SDOH) Interventions     Readmission Risk Interventions Readmission Risk Prevention Plan 10/04/2018  Transportation Screening Complete  Social Work Consult for [place] Planning/Counseling [place] Not Applicable  Medication Review [employer]) Complete  Some recent data might be hidden

## 2018-10-08 NOTE — Discharge Instructions (Signed)
Cover wounds draining wounds with nonstick dressing

## 2018-10-08 NOTE — Progress Notes (Signed)
Pt left via EMS to [facility] at 2140. Daughter [name] is aware. No signs of distress noted at discharge.

## 2018-10-08 NOTE — Progress Notes (Signed)
[facility] Daily Progress Note   Subjective: 1 Day Post-Op: 1. Ultrasound guidance for vascular access right femoral artery 2. Catheter placement into left SFA from right femoral approach 3. Aortogram and selective left lower extremity angiogram 4. Percutaneous transluminal angioplasty of left popliteal artery with 5 mm diameter by 8 cm length Lutonix drug-coated angioplasty balloon 5.  Percutaneous transluminal angioplasty of the left posterior tibial artery with a 2.5 mm diameter by 10 cm length angioplasty balloon             6. StarClose closure device right femoral artery  Patient without complaint this AM. No issues overnight. To be discharged to rehab today.   Objective: Vitals:   10/07/18 1307 10/07/18 1928 10/08/18 0449 10/08/18 0946  BP: 133/87 118/87 125/89 (!) 122/99  Pulse: 93 (!) 51 (!) 104 88  Resp:  18 16   Temp:  98.1 F (36.7 C) 98 F (36.7 C)   TempSrc:   Oral   SpO2:  100% 97%   Weight:      Height:        Intake/Output Summary (Last 24 hours) at 10/08/2018 1032 Last data filed at 10/08/2018 1025 Gross per 24 hour  Intake 1155 ml  Output -  Net 1155 ml   Physical Exam: A&Ox3, NAD CV: RRR Pulmonary: CTA Bilaterally Abdomen: Soft, Nontender, Nondistended Right Groin: Access site - clean, dry and intact Vascular:  Left Lower Extremity: Thigh soft.  Calf soft.  Extremities warm distally to toes.  Good capillary refill.  Wound stable.   Laboratory: CBC    Component Value Date/Time   WBC 7.9 10/08/2018 0612   HGB 12.6 10/08/2018 0612   HGB 12.7 12/13/2013 0623   HCT 38.6 10/08/2018 0612   HCT 38.4 12/13/2013 0623   PLT 179 10/08/2018 0612   PLT 133 (L) 12/13/2013 0623   BMET    Component Value Date/Time   NA 140 10/08/2018 0612   NA 143 09/02/2018 1229   NA 136 12/13/2013 0623   K 3.2 (L) 10/08/2018 0612   K 3.8 12/13/2013 0623   CL 104 10/08/2018 0612    CL 104 12/13/2013 0623   CO2 29 10/08/2018 0612   CO2 28 12/13/2013 0623   GLUCOSE 134 (H) 10/08/2018 0612   GLUCOSE 100 (H) 12/13/2013 0623   BUN 20 10/08/2018 0612   BUN 38 (H) 09/02/2018 1229   BUN 16 12/13/2013 0623   CREATININE 0.80 10/08/2018 0612   CREATININE 1.01 12/13/2013 0623   CALCIUM 8.1 (L) 10/08/2018 0612   CALCIUM 9.7 12/13/2013 0623   GFRNONAA >60 10/08/2018 0612   GFRNONAA 56 (L) 12/13/2013 0623   GFRNONAA 58 (L) 09/27/2013 1332   GFRAA >60 10/08/2018 0612   GFRAA >60 12/13/2013 0623   GFRAA >60 09/27/2013 1332   Assessment/Planning: The patient is an 84 year old female with multiple medical issues including peripheral artery disease status post successful vascularization of the left lower extremity POD#1 1) would continue Unna boot therapy to the bilateral lower extremity changed weekly 2) we will see the patient in our clinic to continue to surveilled her peripheral artery disease and possibly move forward with a right lower extremity angiogram in the near future 3) patient is on aspirin, Xarelto and a statin for medical management 4) vascular surgery to sign off at this time  Discussed with Dr. Ellis Parents Pina Sirianni PA-C 10/08/2018 10:32 AM

## 2018-10-11 DIAGNOSIS — G9340 Encephalopathy, unspecified: Secondary | ICD-10-CM | POA: Insufficient documentation

## 2018-10-13 ENCOUNTER — Ambulatory Visit: Payer: Medicare Other | Admitting: Internal Medicine

## 2018-10-13 ENCOUNTER — Other Ambulatory Visit: Payer: Self-pay

## 2018-10-13 ENCOUNTER — Inpatient Hospital Stay
Admission: EM | Admit: 2018-10-13 | Discharge: 2018-10-26 | DRG: 981 | Disposition: A | Discharge status: Skilled Nursing Facility | Payer: Medicare Other | Source: Skilled Nursing Facility | Attending: Specialist | Admitting: Specialist

## 2018-10-13 ENCOUNTER — Emergency Department: Payer: Medicare Other

## 2018-10-13 DIAGNOSIS — I5023 Acute on chronic systolic (congestive) heart failure: Secondary | ICD-10-CM | POA: Diagnosis present

## 2018-10-13 DIAGNOSIS — Z823 Family history of stroke: Secondary | ICD-10-CM

## 2018-10-13 DIAGNOSIS — I739 Peripheral vascular disease, unspecified: Secondary | ICD-10-CM | POA: Diagnosis present

## 2018-10-13 DIAGNOSIS — I70243 Atherosclerosis of native arteries of left leg with ulceration of ankle: Secondary | ICD-10-CM | POA: Diagnosis not present

## 2018-10-13 DIAGNOSIS — I878 Other specified disorders of veins: Secondary | ICD-10-CM | POA: Diagnosis present

## 2018-10-13 DIAGNOSIS — F0280 Dementia in other diseases classified elsewhere without behavioral disturbance: Secondary | ICD-10-CM | POA: Diagnosis present

## 2018-10-13 DIAGNOSIS — L97919 Non-pressure chronic ulcer of unspecified part of right lower leg with unspecified severity: Secondary | ICD-10-CM | POA: Diagnosis present

## 2018-10-13 DIAGNOSIS — E162 Hypoglycemia, unspecified: Secondary | ICD-10-CM | POA: Diagnosis not present

## 2018-10-13 DIAGNOSIS — I70245 Atherosclerosis of native arteries of left leg with ulceration of other part of foot: Secondary | ICD-10-CM | POA: Diagnosis not present

## 2018-10-13 DIAGNOSIS — I771 Stricture of artery: Secondary | ICD-10-CM | POA: Diagnosis present

## 2018-10-13 DIAGNOSIS — J9611 Chronic respiratory failure with hypoxia: Secondary | ICD-10-CM | POA: Diagnosis present

## 2018-10-13 DIAGNOSIS — Z79899 Other long term (current) drug therapy: Secondary | ICD-10-CM

## 2018-10-13 DIAGNOSIS — Z95810 Presence of automatic (implantable) cardiac defibrillator: Secondary | ICD-10-CM

## 2018-10-13 DIAGNOSIS — Z923 Personal history of irradiation: Secondary | ICD-10-CM

## 2018-10-13 DIAGNOSIS — Z86718 Personal history of other venous thrombosis and embolism: Secondary | ICD-10-CM

## 2018-10-13 DIAGNOSIS — E876 Hypokalemia: Secondary | ICD-10-CM | POA: Diagnosis not present

## 2018-10-13 DIAGNOSIS — I130 Hypertensive heart and chronic kidney disease with heart failure and stage 1 through stage 4 chronic kidney disease, or unspecified chronic kidney disease: Principal | ICD-10-CM | POA: Diagnosis present

## 2018-10-13 DIAGNOSIS — G309 Alzheimer's disease, unspecified: Secondary | ICD-10-CM | POA: Diagnosis present

## 2018-10-13 DIAGNOSIS — E785 Hyperlipidemia, unspecified: Secondary | ICD-10-CM | POA: Diagnosis present

## 2018-10-13 DIAGNOSIS — I480 Paroxysmal atrial fibrillation: Secondary | ICD-10-CM | POA: Diagnosis present

## 2018-10-13 DIAGNOSIS — Z9012 Acquired absence of left breast and nipple: Secondary | ICD-10-CM

## 2018-10-13 DIAGNOSIS — N183 Chronic kidney disease, stage 3 unspecified: Secondary | ICD-10-CM | POA: Diagnosis present

## 2018-10-13 DIAGNOSIS — Z20828 Contact with and (suspected) exposure to other viral communicable diseases: Secondary | ICD-10-CM | POA: Diagnosis present

## 2018-10-13 DIAGNOSIS — I509 Heart failure, unspecified: Secondary | ICD-10-CM | POA: Diagnosis present

## 2018-10-13 DIAGNOSIS — I4819 Other persistent atrial fibrillation: Secondary | ICD-10-CM | POA: Diagnosis present

## 2018-10-13 DIAGNOSIS — Z7982 Long term (current) use of aspirin: Secondary | ICD-10-CM

## 2018-10-13 DIAGNOSIS — Z9221 Personal history of antineoplastic chemotherapy: Secondary | ICD-10-CM

## 2018-10-13 DIAGNOSIS — L97929 Non-pressure chronic ulcer of unspecified part of left lower leg with unspecified severity: Secondary | ICD-10-CM | POA: Diagnosis present

## 2018-10-13 DIAGNOSIS — Z8673 Personal history of transient ischemic attack (TIA), and cerebral infarction without residual deficits: Secondary | ICD-10-CM

## 2018-10-13 DIAGNOSIS — Z7901 Long term (current) use of anticoagulants: Secondary | ICD-10-CM

## 2018-10-13 DIAGNOSIS — Z87891 Personal history of nicotine dependence: Secondary | ICD-10-CM

## 2018-10-13 DIAGNOSIS — Z85038 Personal history of other malignant neoplasm of large intestine: Secondary | ICD-10-CM

## 2018-10-13 DIAGNOSIS — N179 Acute kidney failure, unspecified: Secondary | ICD-10-CM | POA: Diagnosis present

## 2018-10-13 DIAGNOSIS — I428 Other cardiomyopathies: Secondary | ICD-10-CM | POA: Diagnosis present

## 2018-10-13 DIAGNOSIS — Z515 Encounter for palliative care: Secondary | ICD-10-CM | POA: Diagnosis not present

## 2018-10-13 DIAGNOSIS — J449 Chronic obstructive pulmonary disease, unspecified: Secondary | ICD-10-CM | POA: Diagnosis present

## 2018-10-13 DIAGNOSIS — Z853 Personal history of malignant neoplasm of breast: Secondary | ICD-10-CM

## 2018-10-13 DIAGNOSIS — Z9049 Acquired absence of other specified parts of digestive tract: Secondary | ICD-10-CM

## 2018-10-13 DIAGNOSIS — I5022 Chronic systolic (congestive) heart failure: Secondary | ICD-10-CM | POA: Diagnosis present

## 2018-10-13 DIAGNOSIS — I70239 Atherosclerosis of native arteries of right leg with ulceration of unspecified site: Secondary | ICD-10-CM | POA: Diagnosis not present

## 2018-10-13 DIAGNOSIS — Z96651 Presence of right artificial knee joint: Secondary | ICD-10-CM | POA: Diagnosis present

## 2018-10-13 DIAGNOSIS — Z8249 Family history of ischemic heart disease and other diseases of the circulatory system: Secondary | ICD-10-CM

## 2018-10-13 LAB — COMPREHENSIVE METABOLIC PANEL
ALT: 11 U/L (ref 0–44)
AST: 29 U/L (ref 15–41)
Albumin: 3 g/dL — ABNORMAL LOW (ref 3.5–5.0)
Alkaline Phosphatase: 60 U/L (ref 38–126)
Anion gap: 14 (ref 5–15)
BUN: 37 mg/dL — ABNORMAL HIGH (ref 8–23)
CO2: 22 mmol/L (ref 22–32)
Calcium: 8.8 mg/dL — ABNORMAL LOW (ref 8.9–10.3)
Chloride: 103 mmol/L (ref 98–111)
Creatinine, Ser: 1.33 mg/dL — ABNORMAL HIGH (ref 0.44–1.00)
GFR calc Af Amer: 43 mL/min — ABNORMAL LOW (ref >60)
GFR calc non Af Amer: 37 mL/min — ABNORMAL LOW (ref >60)
Glucose, Bld: 106 mg/dL — ABNORMAL HIGH (ref 70–99)
Potassium: 4 mmol/L (ref 3.5–5.1)
Sodium: 139 mmol/L (ref 135–145)
Total Bilirubin: 1.6 mg/dL — ABNORMAL HIGH (ref 0.3–1.2)
Total Protein: 6.4 g/dL — ABNORMAL LOW (ref 6.5–8.1)

## 2018-10-13 LAB — CBC WITH DIFFERENTIAL/PLATELET
Abs Immature Granulocytes: 0.06 10*3/uL (ref 0.00–0.07)
Basophils Absolute: 0 10*3/uL (ref 0.0–0.1)
Basophils Relative: 0 %
Eosinophils Absolute: 0 10*3/uL (ref 0.0–0.5)
Eosinophils Relative: 0 %
HCT: 37.5 % (ref 36.0–46.0)
Hemoglobin: 12.1 g/dL (ref 12.0–15.0)
Immature Granulocytes: 1 %
Lymphocytes Relative: 15 %
Lymphs Abs: 1.2 10*3/uL (ref 0.7–4.0)
MCH: 30 pg (ref 26.0–34.0)
MCHC: 32.3 g/dL (ref 30.0–36.0)
MCV: 93.1 fL (ref 80.0–100.0)
Monocytes Absolute: 0.7 10*3/uL (ref 0.1–1.0)
Monocytes Relative: 9 %
Neutro Abs: 5.6 10*3/uL (ref 1.7–7.7)
Neutrophils Relative %: 75 %
Platelets: 202 10*3/uL (ref 150–400)
RBC: 4.03 MIL/uL (ref 3.87–5.11)
RDW: 15.9 % — ABNORMAL HIGH (ref 11.5–15.5)
WBC: 7.6 10*3/uL (ref 4.0–10.5)
nRBC: 0.4 % — ABNORMAL HIGH (ref 0.0–0.2)

## 2018-10-13 LAB — SARS CORONAVIRUS 2 BY RT PCR (HOSPITAL ORDER, PERFORMED IN ~~LOC~~ HOSPITAL LAB): SARS Coronavirus 2: NEGATIVE

## 2018-10-13 LAB — TROPONIN I (HIGH SENSITIVITY): Troponin I (High Sensitivity): 25 ng/L — ABNORMAL HIGH (ref <18)

## 2018-10-13 LAB — BRAIN NATRIURETIC PEPTIDE: B Natriuretic Peptide: 4041 pg/mL — ABNORMAL HIGH (ref 0.0–100.0)

## 2018-10-13 MED ORDER — ONDANSETRON HCL 4 MG PO TABS: 4.0000 mg | ORAL_TABLET | Freq: Four times a day (QID) | ORAL | Status: DC | PRN

## 2018-10-13 MED ORDER — POTASSIUM CHLORIDE CRYS ER 20 MEQ PO TBCR
20.0000 meq | EXTENDED_RELEASE_TABLET | Freq: Every day | ORAL | Status: AC
  Administered 2018-10-13 – 2018-10-14 (×2): 20 meq via ORAL
  Filled 2018-10-13 (×2): qty 1

## 2018-10-13 MED ORDER — BISACODYL 5 MG PO TBEC
5.0000 mg | DELAYED_RELEASE_TABLET | Freq: Every day | ORAL | Status: DC | PRN
  Administered 2018-10-24: 23:00:00 5 mg via ORAL
  Filled 2018-10-13: qty 1

## 2018-10-13 MED ORDER — ATORVASTATIN CALCIUM 20 MG PO TABS
40.0000 mg | ORAL_TABLET | Freq: Every day | ORAL | Status: DC
  Administered 2018-10-14 – 2018-10-25 (×11): 40 mg via ORAL
  Filled 2018-10-13 (×12): qty 2

## 2018-10-13 MED ORDER — ASPIRIN EC 81 MG PO TBEC
81.0000 mg | DELAYED_RELEASE_TABLET | Freq: Every day | ORAL | Status: DC
  Administered 2018-10-14 – 2018-10-26 (×11): 81 mg via ORAL
  Filled 2018-10-13 (×12): qty 1

## 2018-10-13 MED ORDER — FUROSEMIDE 10 MG/ML IJ SOLN
40.0000 mg | Freq: Once | INTRAMUSCULAR | Status: AC
  Administered 2018-10-13: 20:00:00 40 mg via INTRAVENOUS
  Filled 2018-10-13: qty 4

## 2018-10-13 MED ORDER — METOPROLOL SUCCINATE ER 50 MG PO TB24
50.0000 mg | ORAL_TABLET | Freq: Every day | ORAL | Status: DC
  Administered 2018-10-14 – 2018-10-25 (×10): 50 mg via ORAL
  Filled 2018-10-13 (×12): qty 1

## 2018-10-13 MED ORDER — TRAZODONE HCL 50 MG PO TABS
25.0000 mg | ORAL_TABLET | Freq: Every evening | ORAL | Status: DC | PRN
  Administered 2018-10-25: 25 mg via ORAL
  Filled 2018-10-13 (×2): qty 1

## 2018-10-13 MED ORDER — FUROSEMIDE 10 MG/ML IJ SOLN: 40.0000 mg | Freq: Once | INTRAMUSCULAR | Status: DC

## 2018-10-13 MED ORDER — SACUBITRIL-VALSARTAN 24-26 MG PO TABS: 1.0000 | ORAL_TABLET | Freq: Two times a day (BID) | ORAL | Status: DC

## 2018-10-13 MED ORDER — ACETAMINOPHEN 325 MG PO TABS
650.0000 mg | ORAL_TABLET | Freq: Four times a day (QID) | ORAL | Status: DC | PRN
  Administered 2018-10-22: 650 mg via ORAL
  Filled 2018-10-13: qty 2

## 2018-10-13 MED ORDER — RIVAROXABAN 15 MG PO TABS
15.0000 mg | ORAL_TABLET | Freq: Every day | ORAL | Status: DC
  Administered 2018-10-14 – 2018-10-23 (×9): 15 mg via ORAL
  Filled 2018-10-13 (×10): qty 1

## 2018-10-13 MED ORDER — FUROSEMIDE 10 MG/ML IJ SOLN
80.0000 mg | Freq: Two times a day (BID) | INTRAMUSCULAR | Status: DC
  Administered 2018-10-13: 22:00:00 40 mg via INTRAVENOUS
  Administered 2018-10-14 – 2018-10-16 (×5): 80 mg via INTRAVENOUS
  Filled 2018-10-13 (×6): qty 8

## 2018-10-13 MED ORDER — ACETAMINOPHEN 650 MG RE SUPP: 650.0000 mg | Freq: Four times a day (QID) | RECTAL | Status: DC | PRN

## 2018-10-13 MED ORDER — ONDANSETRON HCL 4 MG/2ML IJ SOLN: 4.0000 mg | Freq: Four times a day (QID) | INTRAMUSCULAR | Status: DC | PRN

## 2018-10-13 MED ORDER — TRAMADOL HCL 50 MG PO TABS
50.0000 mg | ORAL_TABLET | Freq: Four times a day (QID) | ORAL | Status: DC | PRN
  Administered 2018-10-14 – 2018-10-26 (×7): 50 mg via ORAL
  Filled 2018-10-13 (×8): qty 1

## 2018-10-13 NOTE — ED Notes (Signed)
Upon entering room to give even meds, pt was found playing with her feces. PT cleaned up, hand hygiene performed, full linen change performed. Pt given lotion for itchy legs

## 2018-10-13 NOTE — Progress Notes (Deleted)
Follow-up Outpatient Visit Date: 10/13/2018  Primary Care Provider: Ellamae Sia, MD [place] [id number]  Chief Complaint: ***  HPI:  Julia Gordon is a 84 y.o. year-old female with history of chronic HFrEF secondary to NICM complicated by LV thrombus (2009), persistent atrial fibrillation, bilateral breast cancer, hypertension, hyperlipidemia, and LLE DVT on rivaroxaban, who presents for follow-up of heart failure and leg swelling.  I last saw her on 09/29/2018, at which time she noted some improvement in her leg swelling, though both legs were still edematous with weeping wounds.  She was scheduled to be seen in the wound care clinic 2 days later.  Preceding lower extremity arterial Doppler did not show evidence of obstructive disease.  We agreed to continue with diuresis and medical therapy for heart failure.  Upon evaluation in the wound care clinic on 10/01/2018, Julia Gordon was noted to be somnolent.  There was also concern for cellulitis involving her lower extremities.  She was therefore transported to the ED and subsequently admitted for confusion and possible sepsis.  Vascular surgery performed aortography and runoff.  She underwent angioplasty of the left popliteal and posterior tibial arteries.  --------------------------------------------------------------------------------------------------  Past Medical History:  Diagnosis Date  . Anemia   . Breast cancer ([place]) 2015   a. L mastectomy with chemoradiation  . Colon adenocarcinoma ([place])   . COPD (chronic obstructive pulmonary disease) ([place])   . Depression   . DJD (degenerative joint disease) of knee    right knee  . DVT (deep venous thrombosis) ([place])    a. 06/2018 L popliteal and R peroneal DVTs-->Xarelto.  . H/O hysterectomy with oophorectomy    for DUB  . HFrEF (heart failure with reduced ejection fraction) ([place])    a. 06/2018 Echo: EF 25-30%.  . History of depression   . HLD (hyperlipidemia)   .  HTN (hypertension)   . LV (left ventricular) mural thrombus    history of, resolved (echo 04/09)  . NICM (nonischemic cardiomyopathy) ([place])    a. Reports prior h/o cath @ [facility]; b. status post [place] with [employer] lead in 2009 at [facility], Dr. Boyd Kerbs; c. 2014 - prev seen by G. Lovena Le, MD; c. 06/2018 Echo: EF 25-30%, DD. Diff HK. RVSP 60mmHg. Mildly dil RA. Mild to mod MR. Mod TR. Mild AI.  Julia Gordon Persistent atrial fibrillation ([place])    a. noted in [place] from 2008-2009; b. CHADS2VASc 6 (CHF, HTN, age x 2, vascular disease, female)-->eliquis added 06/2018 in setting of LLE DVT.  . Valvular heart disease    a. 06/2018 Echo: Mod TR, mild to mod MR, mild AI.   Past Surgical History:  Procedure Laterality Date  . ABDOMINAL HYSTERECTOMY    . BREAST BIOPSY Left 05/10/13   [place] bx-positive  . COLONOSCOPY WITH PROPOFOL N/A 01/15/2017   Procedure: COLONOSCOPY WITH PROPOFOL;  Surgeon: Jonathon Bellows, MD;  Location[facility] ENDOSCOPY;  Service: Gastroenterology;  Laterality: N/A;  . LAPAROSCOPIC RIGHT COLON RESECTION  08/10   Dr Pat Patrick, for adenoca   . LOWER EXTREMITY ANGIOGRAPHY Left 10/07/2018   Procedure: Lower Extremity Angiography;  Surgeon: Algernon Huxley, MD;  Location: [facility] LAB;  Service: Cardiovascular;  Laterality: Left;  Julia Gordon MASTECTOMY Left 2015   BREAST CA  . OOPHORECTOMY    . ORIF FEMUR FRACTURE Right 07/21/2017   Procedure: OPEN REDUCTION INTERNAL FIXATION (ORIF) DISTAL FEMUR FRACTURE;  Surgeon: Lovell Sheehan, MD;  Location: [facility] ORS;  Service: Orthopedics;  Laterality: Right;  femur   . PACEMAKER INSERTION  2009  . REPLACEMENT TOTAL KNEE  1990's   right     No outpatient medications have been marked as taking for the 10/13/18 encounter (Appointment) with Billey Wojciak, Harrell Gave, MD.    Allergies: No known allergies  Social History   Tobacco Use  . Smoking status: Former Research scientist (life sciences)  . Smokeless tobacco: Never Used  . Tobacco comment: quit 2006  Substance Use Topics  . Alcohol use:  No    Comment: former  . Drug use: No    Family History  Problem Relation Age of Onset  . Hypertension Father   . Alzheimer's disease Mother   . Breast cancer Sister 66  . Diabetes Other        brothers  . Stroke Brother   . Heart failure Brother   . Lupus Sister     Review of Systems: A 12-system review of systems was performed and was negative except as noted in the HPI.  --------------------------------------------------------------------------------------------------  Physical Exam: There were no vitals taken for this visit.  General:  *** HEENT: No conjunctival pallor or scleral icterus. Moist mucous membranes.  OP clear. Neck: Supple without lymphadenopathy, thyromegaly, JVD, or HJR. No carotid bruit. Lungs: Normal work of breathing. Clear to auscultation bilaterally without wheezes or crackles. Heart: Regular rate and rhythm without murmurs, rubs, or gallops. Non-displaced PMI. Abd: Bowel sounds present. Soft, NT/ND without hepatosplenomegaly Ext: No lower extremity edema. Radial, PT, and DP pulses are 2+ bilaterally. Skin: Warm and dry without rash.  EKG:  ***  Lab Results  Component Value Date   WBC 7.9 10/08/2018   HGB 12.6 10/08/2018   HCT 38.6 10/08/2018   MCV 92.8 10/08/2018   PLT 179 10/08/2018    Lab Results  Component Value Date   NA 140 10/08/2018   K 3.2 (L) 10/08/2018   CL 104 10/08/2018   CO2 29 10/08/2018   BUN 20 10/08/2018   CREATININE 0.80 10/08/2018   GLUCOSE 134 (H) 10/08/2018   ALT 23 10/01/2018    Lab Results  Component Value Date   CHOL 134 10/02/2018   HDL 43 10/02/2018   LDLCALC 79 10/02/2018   TRIG 58 10/02/2018   CHOLHDL 3.1 10/02/2018    --------------------------------------------------------------------------------------------------  ASSESSMENT AND PLAN: Harrell Gave Maeson Lourenco, MD 10/13/2018 7:17 AM

## 2018-10-13 NOTE — ED Notes (Signed)
MD at bedside. 

## 2018-10-13 NOTE — ED Notes (Signed)
Vicente Males RN attempted IV access x1

## 2018-10-13 NOTE — ED Provider Notes (Addendum)
[facility] Emergency Department Provider Note   ____________________________________________    I have reviewed the triage vital signs and the nursing notes.   HISTORY  Chief Complaint Shortness of Breath     HPI Julia Gordon is a 84 y.o. female with a history of COPD, DVTs on Xarelto, heart failure with an EF of 25% presents with complaints of shortness of breath.  Patient notes worsening shortness of breath over the last 24 hours.  She has been on 2 L of nasal cannula oxygen and found to be hypoxic at nursing home, oxygen was increased to 4 L.  Denies fevers chills myalgias.  No cough.  Past Medical History:  Diagnosis Date  . Anemia   . Breast cancer ([place]) 2015   a. L mastectomy with chemoradiation  . Colon adenocarcinoma ([place])   . COPD (chronic obstructive pulmonary disease) ([place])   . Depression   . DJD (degenerative joint disease) of knee    right knee  . DVT (deep venous thrombosis) ([place])    a. 06/2018 L popliteal and R peroneal DVTs-->Xarelto.  . H/O hysterectomy with oophorectomy    for DUB  . HFrEF (heart failure with reduced ejection fraction) ([place])    a. 06/2018 Echo: EF 25-30%.  . History of depression   . HLD (hyperlipidemia)   . HTN (hypertension)   . LV (left ventricular) mural thrombus    history of, resolved (echo 04/09)  . NICM (nonischemic cardiomyopathy) ([place])    a. Reports prior h/o cath @ [facility]; b. status post [place] with [employer] lead in 2009 at [facility], Dr. Boyd Kerbs; c. 2014 - prev seen by G. Lovena Le, MD; c. 06/2018 Echo: EF 25-30%, DD. Diff HK. RVSP 2mmHg. Mildly dil RA. Mild to mod MR. Mod TR. Mild AI.  Marland Kitchen Persistent atrial fibrillation ([place])    a. noted in [place] from 2008-2009; b. CHADS2VASc 6 (CHF, HTN, age x 2, vascular disease, female)-->eliquis added 06/2018 in setting of LLE DVT.  . Valvular heart disease    a. 06/2018 Echo: Mod TR, mild to mod MR, mild AI.    Patient Active Problem List   Diagnosis Date Noted  . Pressure injury of skin 10/02/2018  . Somnolence 10/01/2018  . Stroke ([place]) 10/01/2018  . Persistent atrial fibrillation ([place]) 09/23/2018  . Skin ulcer, limited to breakdown of skin ([place]) 09/23/2018  . Left arm swelling 09/23/2018  . Acute deep vein thrombosis (DVT) of left lower extremity ([place]) 06/27/2018  . Age-related osteoporosis with current pathological fracture with routine healing 07/24/2017  . Breast cancer ([place]) 07/24/2017  . Femur fracture ([place]) 07/20/2017  . HTN (hypertension) 07/20/2017  . PAF (paroxysmal atrial fibrillation) ([place]) 07/20/2017  . Primary cancer of upper inner quadrant of left female breast ([place]) 07/17/2014  . Acute on chronic HFrEF (heart failure with reduced ejection fraction) ([place]) 03/24/2011  . ICD (implantable cardiac defibrillator) in place 03/24/2011  . Dyslipidemia 03/24/2011  . Other primary cardiomyopathies     Past Surgical History:  Procedure Laterality Date  . ABDOMINAL HYSTERECTOMY    . BREAST BIOPSY Left 05/10/13   [place] bx-positive  . COLONOSCOPY WITH PROPOFOL N/A 01/15/2017   Procedure: COLONOSCOPY WITH PROPOFOL;  Surgeon: Jonathon Bellows, MD;  Location: [facility] ENDOSCOPY;  Service: Gastroenterology;  Laterality: N/A;  . LAPAROSCOPIC RIGHT COLON RESECTION  08/10   Dr Pat Patrick, for adenoca   . LOWER EXTREMITY ANGIOGRAPHY Left 10/07/2018   Procedure: Lower Extremity Angiography;  Surgeon: Algernon Huxley,  MD;  Location: [facility] LAB;  Service: Cardiovascular;  Laterality: Left;  Marland Kitchen MASTECTOMY Left 2015   BREAST CA  . OOPHORECTOMY    . ORIF FEMUR FRACTURE Right 07/21/2017   Procedure: OPEN REDUCTION INTERNAL FIXATION (ORIF) DISTAL FEMUR FRACTURE;  Surgeon: Lovell Sheehan, MD;  Location: [facility] ORS;  Service: Orthopedics;  Laterality: Right;  femur   . PACEMAKER INSERTION  2009  . REPLACEMENT TOTAL KNEE  1990's   right     Prior to Admission medications   Medication Sig Start Date End Date Taking? Authorizing Provider   acetaminophen (TYLENOL) 325 MG tablet Take 2 tablets (650 mg total) by mouth every 6 (six) hours as needed for mild pain (or temp > 37.5 C (99.5 F)). 10/08/18   Loletha Grayer, MD  alendronate (FOSAMAX) 70 MG tablet TAKE 1 TAB WEEKLY 30 MINS BEFORE BREAKFAST WITH A FULL GLASS OF WATER. DO NOT LIE DOWN FOR 30 MINS 10/25/17   Lloyd Huger, MD  aspirin EC 81 MG EC tablet Take 1 tablet (81 mg total) by mouth daily. 10/08/18   Loletha Grayer, MD  atorvastatin (LIPITOR) 40 MG tablet Take 1 tablet (40 mg total) by mouth daily at 6 PM. 10/08/18   Loletha Grayer, MD  furosemide (LASIX) 20 MG tablet Take 40 mg (2 tablets) by mouth every morning, take 1 tablet (20 mg total) once in the afternoon. 10/01/18   End, Harrell Gave, MD  metoprolol succinate (TOPROL-XL) 50 MG 24 hr tablet Take 1 tablet (50 mg total) by mouth daily. 10/08/18   Loletha Grayer, MD  potassium chloride SA (K-DUR) 20 MEQ tablet Take 1 tablet (20 mEq total) by mouth daily. 09/29/18   End, Harrell Gave, MD  rivaroxaban (XARELTO) 15 MG TABS tablet Take 1 tablet (15 mg total) by mouth daily with supper. 09/23/18   End, Harrell Gave, MD  sacubitril-valsartan (ENTRESTO) 24-26 MG Take 1 tablet by mouth 2 (two) times daily. 10/06/18   Loletha Grayer, MD  senna-docusate (SENOKOT-S) 8.6-50 MG tablet Take 1 tablet by mouth at bedtime as needed for mild constipation or moderate constipation. 10/08/18   Loletha Grayer, MD  traMADol (ULTRAM) 50 MG tablet Take 1 tablet (50 mg total) by mouth every 6 (six) hours as needed. 10/08/18   Loletha Grayer, MD     Allergies No known allergies  Family History  Problem Relation Age of Onset  . Hypertension Father   . Alzheimer's disease Mother   . Breast cancer Sister 42  . Diabetes Other        brothers  . Stroke Brother   . Heart failure Brother   . Lupus Sister     Social History Social History   Tobacco Use  . Smoking status: Former Research scientist (life sciences)  . Smokeless tobacco: Never Used  . Tobacco  comment: quit 2006  Substance Use Topics  . Alcohol use: No    Comment: former  . Drug use: No    Review of Systems  Constitutional: No fever/chills Eyes: No visual changes.  ENT: No sore throat. Cardiovascular: Denies chest pain. Respiratory: As above Gastrointestinal: No abdominal pain.  No nausea, no vomiting.   Genitourinary: Negative for dysuria. Musculoskeletal: Negative for back pain. Skin: Negative for rash. Neurological: Negative for headaches   ____________________________________________   PHYSICAL EXAM:  VITAL SIGNS: ED Triage Vitals  Enc Vitals Group     BP 10/13/18 1610 (!) 116/95     Pulse Rate 10/13/18 1545 65     Resp 10/13/18 1610 14  Temp 10/13/18 1545 (!) 97.2 F (36.2 C)     Temp Source 10/13/18 1545 Axillary     SpO2 10/13/18 1540 95 %     Weight 10/13/18 1546 72.6 kg (160 lb)     Height 10/13/18 1546 1.575 m (5\' 2" )     Head Circumference --      Peak Flow --      Pain Score 10/13/18 1545 0     Pain Loc --      Pain Edu? --      Excl. in [place]? --     Constitutional: Alert and oriented.  Eyes: Conjunctivae are normal.   Nose: No congestion/rhinnorhea. Mouth/Throat: Mucous membranes are dry Neck:  Painless ROM Cardiovascular: Normal rate, irregular rhythm grossly normal heart sounds.  Good peripheral circulation. Respiratory: Increased respiratory effort, no retractions, scattered wheezes, bibasilar Rales Gastrointestinal: Soft and nontender. No distention.  No CVA tenderness.  Musculoskeletal: .  Warm and well perfused Neurologic:  Normal speech and language. No gross focal neurologic deficits are appreciated.  Skin:  Skin is warm, dry, patient with wounds to the lower extremities, dressings clean dry and intact Psychiatric: Mood and affect are normal. Speech and behavior are normal.  ____________________________________________   LABS (all labs ordered are listed, but only abnormal results are displayed)  Labs Reviewed  CBC  WITH DIFFERENTIAL/PLATELET - Abnormal; Notable for the following components:      Result Value   RDW 15.9 (*)    nRBC 0.4 (*)    All other components within normal limits  COMPREHENSIVE METABOLIC PANEL - Abnormal; Notable for the following components:   Glucose, Bld 106 (*)    BUN 37 (*)    Creatinine, Ser 1.33 (*)    Calcium 8.8 (*)    Total Protein 6.4 (*)    Albumin 3.0 (*)    Total Bilirubin 1.6 (*)    GFR calc non Af Amer 37 (*)    GFR calc Af Amer 43 (*)    All other components within normal limits  BRAIN NATRIURETIC PEPTIDE - Abnormal; Notable for the following components:   B Natriuretic Peptide 4,041.0 (*)    All other components within normal limits  TROPONIN I (HIGH SENSITIVITY) - Abnormal; Notable for the following components:   Troponin I (High Sensitivity) 25 (*)    All other components within normal limits  SARS CORONAVIRUS 2 (HOSPITAL ORDER, [facility])   ____________________________________________  EKG  ED ECG REPORT I, Lavonia Drafts, the attending physician, personally viewed and interpreted this ECG.  Date: 10/13/2018  Rhythm: Atrial fibrillation QRS Axis: normal Intervals: Abnormal ST/T Wave abnormalities: normal Narrative Interpretation: no evidence of acute ischemia  ____________________________________________  RADIOLOGY  Chest x-ray overall unremarkable ____________________________________________   PROCEDURES  Procedure(s) performed: No  Procedures   Critical Care performed: yes  CRITICAL CARE Performed by: Lavonia Drafts   Total critical care time: 30 minutes  Critical care time was exclusive of separately billable procedures and treating other patients.  Critical care was necessary to treat or prevent imminent or life-threatening deterioration.  Critical care was time spent personally by me on the following activities: development of treatment plan with patient and/or surrogate as well as nursing,  discussions with consultants, evaluation of patient's response to treatment, examination of patient, obtaining history from patient or surrogate, ordering and performing treatments and interventions, ordering and review of laboratory studies, ordering and review of radiographic studies, pulse oximetry and re-evaluation of patient's condition.  ____________________________________________  INITIAL IMPRESSION / ASSESSMENT AND PLAN / ED COURSE  Pertinent labs & imaging results that were available during my care of the patient were reviewed by me and considered in my medical decision making (see chart for details).  Patient presents with shortness of breath, increased oxygen requirement in the setting of CHF, COPD.  She has an EF of 20 to 25%.  Differential includes pneumonia, CHF exacerbation, COPD, COVID  Patient's BNP is markedly elevated over 4000 consistent with CHF exacerbation, will give IV Lasix and discussed with hospitalist for admission.  COVID test is negative    ____________________________________________   FINAL CLINICAL IMPRESSION(S) / ED DIAGNOSES  Final diagnoses:  Acute on chronic congestive heart failure, unspecified heart failure type [facility])        Note:  This document was prepared using Dragon voice recognition software and may include unintentional dictation errors.   Lavonia Drafts, MD 10/13/18 1946    Lavonia Drafts, MD 10/19/18 2050

## 2018-10-13 NOTE — H&P (Signed)
[facility] [place] NAME: Julia Gordon    MR#:  [id number]  DATE OF BIRTH:  1934/05/10  DATE OF ADMISSION:  10/13/2018  PRIMARY CARE PHYSICIAN: Ellamae Sia, MD   REQUESTING/REFERRING PHYSICIAN: Dr. Corky Downs  CHIEF COMPLAINT: Shortness of breath   Chief Complaint  Patient presents with  . Shortness of Breath    HISTORY OF PRESENT ILLNESS:  Julia Gordon  is a 84 y.o. female with a known history of breast cancer, colon cancer, history of DVT comes from [place] because of shortness of breath, edema of extremities.  Patient usually on 2 L of oxygen all the time but at nursing home because of shortness of breath patient needed a higher amount of oxygen up to 4 L.  Patient is not able to give me any complaints says that she felt short of breath this afternoon but she feels better now and wants to go home.  Received IV Lasix in the emergency room.  Patient was admitted last week somnolence, left Leg wound. PAST MEDICAL HISTORY:   Past Medical History:  Diagnosis Date  . Anemia   . Breast cancer ([place]) 2015   a. L mastectomy with chemoradiation  . Colon adenocarcinoma ([place])   . COPD (chronic obstructive pulmonary disease) ([place])   . Depression   . DJD (degenerative joint disease) of knee    right knee  . DVT (deep venous thrombosis) ([place])    a. 06/2018 L popliteal and R peroneal DVTs-->Xarelto.  . H/O hysterectomy with oophorectomy    for DUB  . HFrEF (heart failure with reduced ejection fraction) ([place])    a. 06/2018 Echo: EF 25-30%.  . History of depression   . HLD (hyperlipidemia)   . HTN (hypertension)   . LV (left ventricular) mural thrombus    history of, resolved (echo 04/09)  . NICM (nonischemic cardiomyopathy) ([place])    a. Reports prior h/o cath @ [facility]; b. status post [place] with [employer] lead in 2009 at [facility], Dr. Boyd Kerbs; c. 2014 - prev seen by G. Lovena Le, MD; c. 06/2018 Echo: EF 25-30%, DD. Diff HK. RVSP  74mmHg. Mildly dil RA. Mild to mod MR. Mod TR. Mild AI.  Marland Kitchen Persistent atrial fibrillation ([place])    a. noted in [place] from 2008-2009; b. CHADS2VASc 6 (CHF, HTN, age x 2, vascular disease, female)-->eliquis added 06/2018 in setting of LLE DVT.  . Valvular heart disease    a. 06/2018 Echo: Mod TR, mild to mod MR, mild AI.    PAST SURGICAL HISTOIRY:   Past Surgical History:  Procedure Laterality Date  . ABDOMINAL HYSTERECTOMY    . BREAST BIOPSY Left 05/10/13   [place] bx-positive  . COLONOSCOPY WITH PROPOFOL N/A 01/15/2017   Procedure: COLONOSCOPY WITH PROPOFOL;  Surgeon: Jonathon Bellows, MD;  Location: [facility] ENDOSCOPY;  Service: Gastroenterology;  Laterality: N/A;  . LAPAROSCOPIC RIGHT COLON RESECTION  08/10   Dr Pat Patrick, for adenoca   . LOWER EXTREMITY ANGIOGRAPHY Left 10/07/2018   Procedure: Lower Extremity Angiography;  Surgeon: Algernon Huxley, MD;  Location: [facility] CV LAB;  Service: Cardiovascular;  Laterality: Left;  Marland Kitchen MASTECTOMY Left 2015   BREAST CA  . OOPHORECTOMY    . ORIF FEMUR FRACTURE Right 07/21/2017   Procedure: OPEN REDUCTION INTERNAL FIXATION (ORIF) DISTAL FEMUR FRACTURE;  Surgeon: Lovell Sheehan, MD;  Location: [facility] ORS;  Service: Orthopedics;  Laterality: Right;  femur   . PACEMAKER INSERTION  2009  .  REPLACEMENT TOTAL KNEE  1990's   right     SOCIAL HISTORY:   Social History   Tobacco Use  . Smoking status: Former Research scientist (life sciences)  . Smokeless tobacco: Never Used  . Tobacco comment: quit 2006  Substance Use Topics  . Alcohol use: No    Comment: former    FAMILY HISTORY:   Family History  Problem Relation Age of Onset  . Hypertension Father   . Alzheimer's disease Mother   . Breast cancer Sister 45  . Diabetes Other        brothers  . Stroke Brother   . Heart failure Brother   . Lupus Sister     DRUG ALLERGIES:   Allergies  Allergen Reactions  . No Known Allergies     REVIEW OF SYSTEMS:  CONSTITUTIONAL: No fever, fatigue or weakness.  EYES: No  blurred or double vision.  EARS, NOSE, AND THROAT: No tinnitus or ear pain.  RESPIRATORY:  shortness of breath, orthopnea, PND, leg edema CARDIOVASCULAR: No chest pain, orthopnea, edema.  GASTROINTESTINAL: No nausea, vomiting, diarrhea or abdominal pain.  GENITOURINARY: No dysuria, hematuria.  ENDOCRINE: No polyuria, nocturia,  HEMATOLOGY: No anemia, easy bruising or bleeding SKIN: Left leg lower part dressing present due to chronic wound. MUSCULOSKELETAL: No joint pain or arthritis.  Patient has chronic wound to left lower leg, gets dressing changes at wound care center. NEUROLOGIC: No tingling, numbness, weakness.  PSYCHIATRY: No anxiety or depression.  Patient daughter mentioned that she does have memory gaps and dementia.  MEDICATIONS AT HOME:   Prior to Admission medications   Medication Sig Start Date End Date Taking? Authorizing Provider  acetaminophen (TYLENOL) 325 MG tablet Take 2 tablets (650 mg total) by mouth every 6 (six) hours as needed for mild pain (or temp > 37.5 C (99.5 F)). 10/08/18   Loletha Grayer, MD  alendronate (FOSAMAX) 70 MG tablet TAKE 1 TAB WEEKLY 30 MINS BEFORE BREAKFAST WITH A FULL GLASS OF WATER. DO NOT LIE DOWN FOR 30 MINS 10/25/17   Ll[place] Huger, MD  aspirin EC 81 MG EC tablet Take 1 tablet (81 mg total) by mouth daily. 10/08/18   Loletha Grayer, MD  atorvastatin (LIPITOR) 40 MG tablet Take 1 tablet (40 mg total) by mouth daily at 6 PM. 10/08/18   Loletha Grayer, MD  furosemide (LASIX) 20 MG tablet Take 40 mg (2 tablets) by mouth every morning, take 1 tablet (20 mg total) once in the afternoon. 10/01/18   End, Harrell Gave, MD  metoprolol succinate (TOPROL-XL) 50 MG 24 hr tablet Take 1 tablet (50 mg total) by mouth daily. 10/08/18   Loletha Grayer, MD  potassium chloride SA (K-DUR) 20 MEQ tablet Take 1 tablet (20 mEq total) by mouth daily. 09/29/18   End, Harrell Gave, MD  rivaroxaban (XARELTO) 15 MG TABS tablet Take 1 tablet (15 mg total) by mouth  daily with supper. 09/23/18   End, Harrell Gave, MD  sacubitril-valsartan (ENTRESTO) 24-26 MG Take 1 tablet by mouth 2 (two) times daily. 10/06/18   Loletha Grayer, MD  senna-docusate (SENOKOT-S) 8.6-50 MG tablet Take 1 tablet by mouth at bedtime as needed for mild constipation or moderate constipation. 10/08/18   Loletha Grayer, MD  traMADol (ULTRAM) 50 MG tablet Take 1 tablet (50 mg total) by mouth every 6 (six) hours as needed. 10/08/18   Loletha Grayer, MD      VITAL SIGNS:  Blood pressure 113/86, pulse 86, temperature (!) 97.2 F (36.2 C), temperature source Axillary, resp. rate 10, height  5\' 2"  (1.575 m), weight 72.6 kg, SpO2 97 %.  PHYSICAL EXAMINATION:  GENERAL:  84 y.o.-year-old patient lying in the bed with no acute distress.  EYES: Pupils equal, round, reactive to light and accommodation. No scleral icterus. Extraocular muscles intact.  HEENT: Head atraumatic, normocephalic. Oropharynx and nasopharynx clear.  NECK:  Supple, no jugular venous distention. No thyroid enlargement, no tenderness.  LUNGS:  diminished breath sounds with basal crepitations. CARDIOVASCULAR: S1, S2 normal. No murmurs, rubs, or gallops.  ABDOMEN: Soft, nontender, nondistended. Bowel sounds present. No organomegaly or mass.  EXTREMITIES: No pedal edema, cyanosis, or clubbing.  NEUROLOGIC: Cranial nerves II through XII are intact. Muscle strength 5/5 in all extremities. Sensation intact. Gait not checked.  PSYCHIATRIC: HAS SOME BASELINE DEMENTIA BUT ABLE TO ANSWER QUESTIONS APPROPRIATELY.   SKIN: Patient has bilateral lower extremity wounds but more on the left leg.  LABORATORY PANEL:   CBC Recent Labs  Lab 10/13/18 1816  WBC 7.6  HGB 12.1  HCT 37.5  PLT 202   ------------------------------------------------------------------------------------------------------------------  Chemistries  Recent Labs  Lab 10/07/18 0504  10/13/18 1816  NA 140   < > 139  K 3.3*   < > 4.0  CL 102   < > 103  CO2  27   < > 22  GLUCOSE 117*   < > 106*  BUN 18   < > 37*  CREATININE 0.93   < > 1.33*  CALCIUM 8.2*   < > 8.8*  MG 1.8  --   --   AST  --   --  29  ALT  --   --  11  ALKPHOS  --   --  60  BILITOT  --   --  1.6*   < > = values in this interval not displayed.   ------------------------------------------------------------------------------------------------------------------  Cardiac Enzymes No results for input(s): TROPONINI in the last 168 hours. ------------------------------------------------------------------------------------------------------------------  RADIOLOGY:  Dg Chest Port 1 View  Result Date: 10/13/2018 CLINICAL DATA:  Difficulty breathing. EXAM: PORTABLE CHEST 1 VIEW COMPARISON:  10/01/2018 FINDINGS: At 1613 hours. The cardio pericardial silhouette is enlarged. The lungs are clear without focal pneumonia, edema, pneumothorax or pleural effusion. Streaky opacity at the right base again noted, likely atelectasis or scarring battery pack for left pacer/AICD obscures the left base. The visualized bony structures of the thorax are intact. Right Port-A-Cath again noted. Telemetry leads overlie the chest. IMPRESSION: Low volume film basilar atelectasis or scarring. Electronically Signed   By: Misty Stanley M.D.   On: 10/13/2018 16:35    EKG:   Orders placed or performed during the hospital encounter of 10/13/18  . ED EKG  . ED EKG  . EKG 12-Lead  . EKG 12-Lead    IMPRESSION AND PLAN:  84 year old female with history of left frontal lobe stroke, peripheral vascular disease with bilateral lower extremity ulcers, chronic systolic heart failure with EF less than 20% who was recently admitted last week for somnolence now comes from nursing home because of worsening shortness of breath and needing higher amount of oxygen.  #1 acute on chronic systolic heart failure with elevated BNP, patient needing higher amount of oxygen, admitted to telemetry, continue IV Lasix as the BP  permits.  Patient EF is less than 20%.  Check daily I&O's, continue oxygen 2 L to keep sats more than 90%.  Continue Entresto.  Aldactone therapy restarted if the BP permits tomorrow and also renal function allows. 2.  Right leg DVT, patient on Xarelto, continue  that. 3.  History of chronic atrial fibrillation, rate controlled, continue beta-blockers, Xarelto. 4.   acute on chronic renal failure with CKD stage II; patient creatinine is slightly up.  Monitor closely on diuretics. 5.  Peripheral vascular disease with bilateral lower extremity ulcers, left leg ulcer, patient was seen by vascular surgery during the last admission with angioplasty of the left leg, patient also received Keflex.  Dressing changes while in the hospital. All the records are reviewed and case discussed with ED provider. Management plans discussed with the patient, family and they are in agreement.  CODE STATUS:limited.  Patient daughter mentioned that she does not want intubation but wants CPR.  TOTAL TIME TAKING CARE OF THIS PATIENT: 50 minutes.    Epifanio Lesches M.D on 10/13/2018 at 8:53 PM  Between 7am to 6pm - Pager - [phone]  After 6pm go to [email or web address] - password [email or web address] [place] [facility]  Office  [phone]  CC: Primary care physician; Ellamae Sia, MD  Note: This dictation was prepared with Dragon dictation along with smaller phrase technology. Any transcriptional errors that result from this process are unintentional.

## 2018-10-13 NOTE — ED Triage Notes (Signed)
PT to ED via EMS from [facility]. Staff called out for breathing difficulty. Staff states pt was anywhere from 55%-90% on room air. EMS reports 95% on 4L. Pt has had 1 neb tx. L arm edema and right eye edema per [employer]. Pt hx of CHF. Being tx for weeping leg wounds.

## 2018-10-13 NOTE — ED Notes (Signed)
IV team at bedside 

## 2018-10-13 NOTE — ED Notes (Signed)
PT removing EKG and oxygen wires. Removing oxygen as well. PT resettled and reminded to keep oxygen on.

## 2018-10-14 DIAGNOSIS — Z515 Encounter for palliative care: Secondary | ICD-10-CM

## 2018-10-14 DIAGNOSIS — I509 Heart failure, unspecified: Secondary | ICD-10-CM

## 2018-10-14 DIAGNOSIS — I5023 Acute on chronic systolic (congestive) heart failure: Secondary | ICD-10-CM

## 2018-10-14 LAB — BASIC METABOLIC PANEL
Anion gap: 11 (ref 5–15)
BUN: 40 mg/dL — ABNORMAL HIGH (ref 8–23)
CO2: 28 mmol/L (ref 22–32)
Calcium: 8.9 mg/dL (ref 8.9–10.3)
Chloride: 102 mmol/L (ref 98–111)
Creatinine, Ser: 1.57 mg/dL — ABNORMAL HIGH (ref 0.44–1.00)
GFR calc Af Amer: 35 mL/min — ABNORMAL LOW (ref >60)
GFR calc non Af Amer: 30 mL/min — ABNORMAL LOW (ref >60)
Glucose, Bld: 86 mg/dL (ref 70–99)
Potassium: 4.6 mmol/L (ref 3.5–5.1)
Sodium: 141 mmol/L (ref 135–145)

## 2018-10-14 LAB — CBC
HCT: 37.7 % (ref 36.0–46.0)
Hemoglobin: 12.3 g/dL (ref 12.0–15.0)
MCH: 30.4 pg (ref 26.0–34.0)
MCHC: 32.6 g/dL (ref 30.0–36.0)
MCV: 93.1 fL (ref 80.0–100.0)
Platelets: 205 10*3/uL (ref 150–400)
RBC: 4.05 MIL/uL (ref 3.87–5.11)
RDW: 16.1 % — ABNORMAL HIGH (ref 11.5–15.5)
WBC: 8.2 10*3/uL (ref 4.0–10.5)
nRBC: 0.4 % — ABNORMAL HIGH (ref 0.0–0.2)

## 2018-10-14 LAB — BLOOD GAS, ARTERIAL
Acid-Base Excess: 3.8 mmol/L — ABNORMAL HIGH (ref 0.0–2.0)
Bicarbonate: 25.4 mmol/L (ref 20.0–28.0)
FIO2: 0.4
O2 Saturation: 99.7 %
Patient temperature: 37
pCO2 arterial: 29 mmHg — ABNORMAL LOW (ref 32.0–48.0)
pH, Arterial: 7.55 — ABNORMAL HIGH (ref 7.350–7.450)
pO2, Arterial: 172 mmHg — ABNORMAL HIGH (ref 83.0–108.0)

## 2018-10-14 LAB — GLUCOSE, CAPILLARY: Glucose-Capillary: 66 mg/dL — ABNORMAL LOW (ref 70–99)

## 2018-10-14 LAB — MRSA PCR SCREENING: MRSA by PCR: NEGATIVE

## 2018-10-14 MED ORDER — CHLORHEXIDINE GLUCONATE CLOTH 2 % EX PADS
6.0000 | MEDICATED_PAD | Freq: Every day | CUTANEOUS | Status: DC
  Administered 2018-10-15 – 2018-10-24 (×9): 6 via TOPICAL

## 2018-10-14 MED ORDER — IPRATROPIUM-ALBUTEROL 0.5-2.5 (3) MG/3ML IN SOLN
3.0000 mL | Freq: Four times a day (QID) | RESPIRATORY_TRACT | Status: DC
  Administered 2018-10-14 – 2018-10-15 (×4): 3 mL via RESPIRATORY_TRACT
  Filled 2018-10-14 (×5): qty 3

## 2018-10-14 NOTE — Progress Notes (Signed)
[facility] [place] NAME: Julia Gordon    MR#:  [id number]  DATE OF BIRTH:  June 21, 1934  SUBJECTIVE:  CHIEF COMPLAINT:   Chief Complaint  Patient presents with  . Shortness of Breath  Patient seen and evaluated today Has shortness of breath No chest pain No fever  REVIEW OF SYSTEMS:    ROS  CONSTITUTIONAL: No documented fever. Has fatigue, weakness. No weight gain, no weight loss.  EYES: No blurry or double vision.  ENT: No tinnitus. No postnasal drip. No redness of the oropharynx.  RESPIRATORY: No cough, no wheeze, no hemoptysis. Has dyspnea.  CARDIOVASCULAR: No chest pain. No orthopnea. No palpitations. No syncope.  GASTROINTESTINAL: No nausea, no vomiting or diarrhea. No abdominal pain. No melena or hematochezia.  GENITOURINARY: No dysuria or hematuria.  ENDOCRINE: No polyuria or nocturia. No heat or cold intolerance.  HEMATOLOGY: No anemia. No bruising. No bleeding.  INTEGUMENTARY: No rashes. No lesions.  MUSCULOSKELETAL: No arthritis. No swelling. No gout.  NEUROLOGIC: No numbness, tingling, or ataxia. No seizure-type activity.  PSYCHIATRIC: No anxiety. No insomnia. No ADD.   DRUG ALLERGIES:   Allergies  Allergen Reactions  . No Known Allergies     VITALS:  Blood pressure 112/76, pulse 87, temperature (!) 97.5 F (36.4 C), temperature source Axillary, resp. rate 20, height 5\' 2"  (1.575 m), weight 77.5 kg, SpO2 92 %.  PHYSICAL EXAMINATION:   Physical Exam  GENERAL:  84 y.o.-year-old patient lying in the bed with no acute distress.  EYES: Pupils equal, round, reactive to light and accommodation. No scleral icterus. Extraocular muscles intact.  HEENT: Head atraumatic, normocephalic. Oropharynx and nasopharynx clear.  NECK:  Supple, no jugular venous distention. No thyroid enlargement, no tenderness.  LUNGS: Decreased breath sounds bilaterally, bibasilar creps heard. No use of accessory muscles of respiration.   CARDIOVASCULAR: S1, S2 normal. No murmurs, rubs, or gallops.  ABDOMEN: Soft, nontender, nondistended. Bowel sounds present. No organomegaly or mass.  EXTREMITIES: No cyanosis, clubbing  has edema b/l.    NEUROLOGIC: Cranial nerves II through XII are intact. No focal Motor or sensory deficits b/l.   PSYCHIATRIC: The patient is alert and oriented x 2.  SKIN: Has lower extremity wounds       LABORATORY PANEL:   CBC Recent Labs  Lab 10/14/18 0810  WBC 8.2  HGB 12.3  HCT 37.7  PLT 205   ------------------------------------------------------------------------------------------------------------------ Chemistries  Recent Labs  Lab 10/13/18 1816 10/14/18 0810  NA 139 141  K 4.0 4.6  CL 103 102  CO2 22 28  GLUCOSE 106* 86  BUN 37* 40*  CREATININE 1.33* 1.57*  CALCIUM 8.8* 8.9  AST 29  --   ALT 11  --   ALKPHOS 60  --   BILITOT 1.6*  --    ------------------------------------------------------------------------------------------------------------------  Cardiac Enzymes No results for input(s): TROPONINI in the last 168 hours. ------------------------------------------------------------------------------------------------------------------  RADIOLOGY:  Dg Chest Port 1 View  Result Date: 10/13/2018 CLINICAL DATA:  Difficulty breathing. EXAM: PORTABLE CHEST 1 VIEW COMPARISON:  10/01/2018 FINDINGS: At 1613 hours. The cardio pericardial silhouette is enlarged. The lungs are clear without focal pneumonia, edema, pneumothorax or pleural effusion. Streaky opacity at the right base again noted, likely atelectasis or scarring battery pack for left pacer/AICD obscures the left base. The visualized bony structures of the thorax are intact. Right Port-A-Cath again noted. Telemetry leads overlie the chest. IMPRESSION: Low volume film basilar atelectasis or scarring. Electronically Signed   By: Randall Hiss  Tery Sanfilippo M.D.   On: 10/13/2018 16:35     ASSESSMENT AND PLAN:   84 year old elderly  female patient with history of breast cancer, colon cancer, history of DVT on oxygen at nursing home, COPD, congestive heart failure with reduced ejection fraction of EF of 25 to 30%, hypertension, hyperlipidemia currently under hospitalist service  -Acute on chronic systolic heart failure exacerbation Continue IV Lasix for diuresis Monitor electrolytes Input output chart and daily body weights Oxygen via nasal cannula Continue Entresto and Aldactone  -History of right leg DVT Continue anticoagulation with Xarelto  -Peripheral vascular disease but bilateral lower extremity ulcers Status post angioplasty left leg during last admission by vascular surgery Wound care team to continue for dressing changes  -Chronic atrial fibrillation Continue rate control with beta-blocker Continue Xarelto for anticoagulation  -Acute on chronic kidney disease stage III Monitor renal function as patient is on diuretics   All the records are reviewed and case discussed with Care Management/Social Worker. Management plans discussed with the patient, family and they are in agreement.  CODE STATUS: Partial code  DVT Prophylaxis: SCDs  TOTAL TIME TAKING CARE OF THIS PATIENT: 37 minutes.   POSSIBLE D/C IN 2 to 3 DAYS, DEPENDING ON CLINICAL CONDITION.  Saundra Shelling M.D on 10/14/2018 at 11:51 AM  Between 7am to 6pm - Pager - [phone]  After 6pm go to [email or web address] - password [email or web address] [place] [facility]  Office  [phone]  CC: Primary care physician; Ellamae Sia, MD  Note: This dictation was prepared with Dragon dictation along with smaller phrase technology. Any transcriptional errors that result from this process are unintentional.

## 2018-10-14 NOTE — Consult Note (Signed)
[place]  Telephone:([phone] [phone]   Name: Julia Gordon Date: 10/14/2018 MRN: [id number]  DOB: 1934-04-18  Patient Care Team: Ellamae Sia, MD as PCP - General (Internal Medicine) End, Harrell Gave, MD as PCP - Cardiology (Cardiology)    REASON FOR CONSULTATION: Palliative Care consult requested for this 84 y.o. female with multiple medical problems including history of stage IIIa adenocarcinoma of the left breast status post neoadjuvant chemotherapy and mastectomy on surveillance.  PMH also notable for COPD, history of DVT, PVD with bilateral lower extremity wounds, cardiomyopathy with an EF of 25 to 30% and history of CHF status post ICD, and persistent atrial fibrillation on Eliquis.  Patient was recently hospitalized 10/01/2018 to 10/08/2018 with altered mental status and was found to have a left frontal lobe stroke.  Patient was discharged to rehab and is now readmitted 10/13/2018 with CHF.  Palliative care was consulted (goals.  SOCIAL HISTORY:     reports that she has quit smoking. She has never used smokeless tobacco. She reports that she does not drink alcohol or use drugs.   Patient was at [facility] for rehab. She has a daughter who is involved in her care.   ADVANCE DIRECTIVES:  Not on file  CODE STATUS: Limited code  PAST MEDICAL HISTORY: Past Medical History:  Diagnosis Date   Anemia    Breast cancer ([place]) 2015   a. L mastectomy with chemoradiation   Colon adenocarcinoma (HCC)    COPD (chronic obstructive pulmonary disease) (HCC)    Depression    DJD (degenerative joint disease) of knee    right knee   DVT (deep venous thrombosis) ([place])    a. 06/2018 L popliteal and R peroneal DVTs-->Xarelto.   H/O hysterectomy with oophorectomy    for DUB   HFrEF (heart failure with reduced ejection fraction) ([place])    a. 06/2018 Echo: EF 25-30%.   History of depression    HLD (hyperlipidemia)      HTN (hypertension)    LV (left ventricular) mural thrombus    history of, resolved (echo 04/09)   NICM (nonischemic cardiomyopathy) ([place])    a. Reports prior h/o cath @ [facility]; b. status post [place] with [employer] lead in 2009 at [facility], Dr. Boyd Kerbs; c. 2014 - prev seen by G. Lovena Le, MD; c. 06/2018 Echo: EF 25-30%, DD. Diff HK. RVSP 19mmHg. Mildly dil RA. Mild to mod MR. Mod TR. Mild AI.   Persistent atrial fibrillation ([place])    a. noted in [place] from 2008-2009; b. CHADS2VASc 6 (CHF, HTN, age x 2, vascular disease, female)-->eliquis added 06/2018 in setting of LLE DVT.   Valvular heart disease    a. 06/2018 Echo: Mod TR, mild to mod MR, mild AI.    PAST SURGICAL HISTORY:  Past Surgical History:  Procedure Laterality Date   ABDOMINAL HYSTERECTOMY     BREAST BIOPSY Left 05/10/13   [place] bx-positive   COLONOSCOPY WITH PROPOFOL N/A 01/15/2017   Procedure: COLONOSCOPY WITH PROPOFOL;  Surgeon: Jonathon Bellows, MD;  Location: [facility] ENDOSCOPY;  Service: Gastroenterology;  Laterality: N/A;   LAPAROSCOPIC RIGHT COLON RESECTION  08/10   Dr Pat Patrick, for adenoca    LOWER EXTREMITY ANGIOGRAPHY Left 10/07/2018   Procedure: Lower Extremity Angiography;  Surgeon: Algernon Huxley, MD;  Location: [place] CV LAB;  Service: Cardiovascular;  Laterality: Left;   MASTECTOMY Left 2015   BREAST CA   OOPHORECTOMY     ORIF FEMUR  FRACTURE Right 07/21/2017   Procedure: OPEN REDUCTION INTERNAL FIXATION (ORIF) DISTAL FEMUR FRACTURE;  Surgeon: Lovell Sheehan, MD;  Location: [facility] ORS;  Service: Orthopedics;  Laterality: Right;  femur    PACEMAKER INSERTION  2009   REPLACEMENT TOTAL KNEE  1990's   right     HEMATOLOGY/ONCOLOGY HISTORY:  Oncology History   No history exists.    ALLERGIES:  is allergic to no known allergies.  MEDICATIONS:  Current Facility-Administered Medications  Medication Dose Route Frequency Provider Last Rate Last Dose   acetaminophen (TYLENOL) tablet 650 mg  650 mg  Oral Q6H PRN Epifanio Lesches, MD       Or   acetaminophen (TYLENOL) suppository 650 mg  650 mg Rectal Q6H PRN Epifanio Lesches, MD       aspirin EC tablet 81 mg  81 mg Oral Daily Epifanio Lesches, MD   81 mg at 10/14/18 0912   atorvastatin (LIPITOR) tablet 40 mg  40 mg Oral q1800 Epifanio Lesches, MD   40 mg at 10/14/18 1759   bisacodyl (DULCOLAX) EC tablet 5 mg  5 mg Oral Daily PRN Epifanio Lesches, MD       Chlorhexidine Gluconate Cloth 2 % PADS 6 each  6 each Topical Daily Pyreddy, Pavan, MD       furosemide (LASIX) injection 80 mg  80 mg Intravenous BID Epifanio Lesches, MD   80 mg at 10/14/18 1759   ipratropium-albuterol (DUONEB) 0.5-2.5 (3) MG/3ML nebulizer solution 3 mL  3 mL Nebulization Q6H Pyreddy, Pavan, MD       metoprolol succinate (TOPROL-XL) 24 hr tablet 50 mg  50 mg Oral Daily Epifanio Lesches, MD   50 mg at 10/14/18 0912   ondansetron (ZOFRAN) tablet 4 mg  4 mg Oral Q6H PRN Epifanio Lesches, MD       Or   ondansetron (ZOFRAN) injection 4 mg  4 mg Intravenous Q6H PRN Epifanio Lesches, MD       Rivaroxaban (XARELTO) tablet 15 mg  15 mg Oral Q supper Epifanio Lesches, MD   15 mg at 10/14/18 1822   traMADol (ULTRAM) tablet 50 mg  50 mg Oral Q6H PRN Epifanio Lesches, MD   50 mg at 10/14/18 0924   traZODone (DESYREL) tablet 25 mg  25 mg Oral QHS PRN Epifanio Lesches, MD        VITAL SIGNS: BP 92/71 (BP Location: Left Arm)    Pulse 71    Temp 98.6 F (37 C) (Oral)    Resp 20    Ht 5\' 2"  (1.575 m)    Wt 170 lb 12.8 oz (77.5 kg)    SpO2 98%    BMI 31.24 kg/m  Filed Weights   10/13/18 1546 10/14/18 0259  Weight: 160 lb (72.6 kg) 170 lb 12.8 oz (77.5 kg)    Estimated body mass index is 31.24 kg/m as calculated from the following:   Height as of this encounter: 5\' 2"  (1.575 m).   Weight as of this encounter: 170 lb 12.8 oz (77.5 kg).  LABS: CBC:    Component Value Date/Time   WBC 8.2 10/14/2018 0810   HGB 12.3  10/14/2018 0810   HGB 12.7 12/13/2013 0623   HCT 37.7 10/14/2018 0810   HCT 38.4 12/13/2013 0623   PLT 205 10/14/2018 0810   PLT 133 (L) 12/13/2013 0623   MCV 93.1 10/14/2018 0810   MCV 95 12/13/2013 0623   NEUTROABS 5.6 10/13/2018 1816   NEUTROABS 4.1 12/13/2013 0623   LYMPHSABS 1.2  10/13/2018 1816   LYMPHSABS 1.2 12/13/2013 0623   MONOABS 0.7 10/13/2018 1816   MONOABS 0.5 12/13/2013 0623   EOSABS 0.0 10/13/2018 1816   EOSABS 0.1 12/13/2013 0623   BASOSABS 0.0 10/13/2018 1816   BASOSABS 0.1 12/13/2013 0623   Comprehensive Metabolic Panel:    Component Value Date/Time   NA 141 10/14/2018 0810   NA 143 09/02/2018 1229   NA 136 12/13/2013 0623   K 4.6 10/14/2018 0810   K 3.8 12/13/2013 0623   CL 102 10/14/2018 0810   CL 104 12/13/2013 0623   CO2 28 10/14/2018 0810   CO2 28 12/13/2013 0623   BUN 40 (H) 10/14/2018 0810   BUN 38 (H) 09/02/2018 1229   BUN 16 12/13/2013 0623   CREATININE 1.57 (H) 10/14/2018 0810   CREATININE 1.01 12/13/2013 0623   GLUCOSE 86 10/14/2018 0810   GLUCOSE 100 (H) 12/13/2013 0623   CALCIUM 8.9 10/14/2018 0810   CALCIUM 9.7 12/13/2013 0623   AST 29 10/13/2018 1816   AST 23 10/06/2013 1328   ALT 11 10/13/2018 1816   ALT 15 10/06/2013 1328   ALKPHOS 60 10/13/2018 1816   ALKPHOS 55 10/06/2013 1328   BILITOT 1.6 (H) 10/13/2018 1816   BILITOT 0.4 10/06/2013 1328   PROT 6.4 (L) 10/13/2018 1816   PROT 7.5 10/06/2013 1328   ALBUMIN 3.0 (L) 10/13/2018 1816   ALBUMIN 3.7 10/06/2013 1328    RADIOGRAPHIC STUDIES: Ct Head Wo Contrast  Result Date: 10/01/2018 CLINICAL DATA:  Altered mental status.  Patient on Xarelto. EXAM: CT HEAD WITHOUT CONTRAST TECHNIQUE: Contiguous axial images were obtained from the base of the skull through the vertex without intravenous contrast. COMPARISON:  October 04, 2006 FINDINGS: Brain: No subdural, epidural, or subarachnoid hemorrhage identified. Ventricles and sulci are prominent but stable. Cerebellum, brainstem, and  basal cisterns are within normal limits. No mass effect or midline shift. White matter changes are identified, particularly in the left frontal lobe such as on axial image 18. There appears to be mild involvement over the overlying cortex suggesting a small left frontal infarct. No other evidence of acute ischemia or infarct identified. Vascular: Calcified atherosclerosis is seen in the intracranial carotids. Skull: Normal. Negative for fracture or focal lesion. Sinuses/Orbits: No acute finding. Other: None. IMPRESSION: 1. Focal white matter changes are seen in the left frontal lobe with a small region of overlying cortical involvement consistent with infarct. These findings are age indeterminate but favored to be nonacute based on appearance. Recommend clinical correlation 2. No other acute abnormalities are identified. Electronically Signed   By: Dorise Bullion III M.D   On: 10/01/2018 16:42   US Carotid Bilateral (at [facility] Only)  Result Date: 10/02/2018 CLINICAL DATA:  84 year old female with a history of stroke EXAM: BILATERAL CAROTID DUPLEX ULTRASOUND TECHNIQUE: Pearline Cables scale imaging, color Doppler and duplex ultrasound were performed of bilateral carotid and vertebral arteries in the neck. COMPARISON:  None. FINDINGS: Criteria: Quantification of carotid stenosis is based on velocity parameters that correlate the residual internal carotid diameter with NASCET-based stenosis levels, using the diameter of the distal internal carotid lumen as the denominator for stenosis measurement. The following velocity measurements were obtained: RIGHT ICA:  Systolic 41 cm/sec, Diastolic 19 cm/sec CCA:  31 cm/sec SYSTOLIC ICA/CCA RATIO:  1.3 ECA:  32 cm/sec LEFT ICA:  Systolic 66 cm/sec, Diastolic 13 cm/sec CCA:  63 cm/sec SYSTOLIC ICA/CCA RATIO:  1.0 ECA:  59 cm/sec Right Brachial SBP: Not acquired Left Brachial SBP: Not  acquired RIGHT CAROTID ARTERY: Calcification of the right common carotid artery. Intermediate  waveform maintained. Heterogeneous and partially calcified plaque at the right carotid bifurcation. No significant lumen shadowing. Low resistance waveform of the right ICA. No significant tortuosity. RIGHT VERTEBRAL ARTERY: Antegrade flow with low resistance waveform. LEFT CAROTID ARTERY: Calcifications of the left common carotid artery. Intermediate waveform maintained. Heterogeneous and partially calcified plaque at the left carotid bifurcation without significant lumen shadowing. Low resistance waveform of the left ICA. No significant tortuosity. LEFT VERTEBRAL ARTERY:  Antegrade flow with low resistance waveform. IMPRESSION: Color duplex indicates moderate heterogeneous and calcified plaque, with no hemodynamically significant stenosis by duplex criteria in the extracranial cerebrovascular circulation. Signed, Dulcy Fanny. Dellia Nims, RPVI [facility] Radiology Electronically Signed   By: Corrie Mckusick D.O.   On: 10/02/2018 11:51   US Arterial Lower Extremity Duplex Bilateral  Result Date: 09/29/2018 CLINICAL DATA:  84 year old female with bilateral lower extremity ulcers for 1 month. Evaluate for underlying peripheral arterial disease. EXAM: BILATERAL LOWER EXTREMITY ARTERIAL DUPLEX SCAN TECHNIQUE: Gray-scale sonography as well as color Doppler and duplex ultrasound was performed to evaluate the arteries of both lower extremities including the common, superficial and profunda femoral arteries, popliteal artery and calf arteries. COMPARISON:  None. FINDINGS: Right Lower Extremity Inflow: Normal common femoral arterial waveforms and velocities. No evidence of inflow (aortoiliac) disease. Outflow: Normal profunda femoral, superficial femoral and popliteal arterial waveforms and velocities. No focal elevation of the PSV to suggest stenosis. Runoff: Normal posterior and anterior tibial arterial waveforms and velocities. Vessels are patent to the ankle. Left Lower  Extremity Inflow: Normal common femoral arterial waveforms and velocities. No evidence of inflow (aortoiliac) disease. Outflow: Normal profunda femoral, superficial femoral and popliteal arterial waveforms and velocities. No focal elevation of the PSV to suggest stenosis. Runoff: Normal posterior and anterior tibial arterial waveforms and velocities. Vessels are patent to the ankle. IMPRESSION: Minimal heterogeneous atherosclerotic plaque bilaterally without evidence of hemodynamically significant stenosis or occlusion. Signed, Criselda Peaches, MD, [place] [facility] Radiology Electronically Signed   By: Jacqulynn Cadet M.D.   On: 09/29/2018 10:04   Dg Chest Port 1 View  Result Date: 10/13/2018 CLINICAL DATA:  Difficulty breathing. EXAM: PORTABLE CHEST 1 VIEW COMPARISON:  10/01/2018 FINDINGS: At 1613 hours. The cardio pericardial silhouette is enlarged. The lungs are clear without focal pneumonia, edema, pneumothorax or pleural effusion. Streaky opacity at the right base again noted, likely atelectasis or scarring battery pack for left pacer/AICD obscures the left base. The visualized bony structures of the thorax are intact. Right Port-A-Cath again noted. Telemetry leads overlie the chest. IMPRESSION: Low volume film basilar atelectasis or scarring. Electronically Signed   By: Misty Stanley M.D.   On: 10/13/2018 16:35   Dg Chest Port 1 View  Result Date: 10/01/2018 CLINICAL DATA:  Sepsis. EXAM: PORTABLE CHEST 1 VIEW COMPARISON:  09/25/2018 FINDINGS: The heart is enlarged but stable. There is moderate tortuosity, ectasia and calcification of the thoracic aorta. The pacer wires/AICD is stable. The right IJ power port is stable. The lungs are clear of an acute process. Minimal right basilar scarring or atelectasis. The bony thorax is intact. IMPRESSION: 1. Stable cardiac enlargement and tortuous ectatic calcified thoracic aorta. 2. Streaky right basilar  scarring or atelectasis but no infiltrates or effusions. Electronically Signed   By: Marijo Sanes M.D.   On: 10/01/2018 16:36   Dg Chest Portable 1 View  Result Date: 09/25/2018 CLINICAL DATA:  Shortness of breath EXAM: PORTABLE CHEST 1 VIEW COMPARISON:  06/27/2018 FINDINGS: Right-sided chest port and left implanted cardiac device remain in place. Stable mild cardiomegaly. Calcified thoracic aorta. No focal airspace consolidation. No pleural effusion or pneumothorax. IMPRESSION: Stable mild cardiomegaly without acute cardiopulmonary process. Electronically Signed   By: Davina Poke M.D.   On: 09/25/2018 16:15    PERFORMANCE STATUS (ECOG) : 4 - Bedbound  Review of Systems Unless otherwise noted, a complete review of systems is negative.  Physical Exam General: ill appearing Pulmonary: wheezing, unlabored Abdomen: soft, nontender, + bowel sounds GU: no suprapubic tenderness Extremities: wounds noted but not visualized Skin: no rashes Neurological: Weakness but otherwise nonfocal  IMPRESSION: Patient is confused and is unable to have a meaningful conversation regarding her goals.  Family at bedside.  I note earlier family meeting with hospitalist and CODE STATUS was changed to limited code.  I tried calling patient's daughter but did not reach her.   Case discussed with nursing staff.   PLAN: -Continue current scope of treatment -Limited code noted. Would recommend full DNR. -Would recommend palliative care following at SNF -Will speak with family to clarify goals  Time Total: 30 minutes  Visit consisted of counseling and education dealing with the complex and emotionally intense issues of symptom management and palliative care in the setting of serious and potentially life-threatening illness.Greater than 50%  of this time was spent counseling and coordinating care related to the above assessment and plan.  Signed by: Altha Harm, PhD, NP-C [phone] (Work Cell)

## 2018-10-14 NOTE — Plan of Care (Signed)
  Problem: Pain Managment: Goal: General experience of comfort will improve Outcome: Progressing   

## 2018-10-14 NOTE — Progress Notes (Signed)
Called by RN taking care of the patient and patient is short of breath ABG was checked and reviewed Oxygen saturation good Hyperventilating We will keep oxygen via nasal cannula at 3 L Monitor saturations Telemetry monitor to continue

## 2018-10-14 NOTE — Progress Notes (Signed)
Advanced care plan. Purpose of the Encounter: CODE STATUS Parties in Attendance: Patient and patient's daughter Patient's Decision Capacity: Not good Subjective/Patient's story: Julia Gordon  is a 84 y.o. female with a known history of breast cancer, colon cancer, history of DVT comes from [place] because of shortness of breath, edema of extremities.  Patient usually on 2 L of oxygen all the time but at nursing home because of shortness of breath patient needed a higher amount of oxygen up to 4 L.  Patient is not able to give me any complaints says that she felt short of breath this afternoon but she feels better now and wants to go home.  Received IV Lasix in the emergency room.  Patient was admitted last week somnolence, left Leg wound. Objective/Medical story Patient needs diuresis with Lasix for removal of excess fluid Needs CHF exacerbation management Needs wound care team evaluation for lower extremity ulcers Goals of care determination:  Discussed with patient's daughter medical condition treatment plan She wants CPR and cardiac resuscitation but does not want intubation ventilator CODE STATUS: Partial code Time spent discussing advanced care planning: 16 minutes

## 2018-10-15 ENCOUNTER — Ambulatory Visit (INDEPENDENT_AMBULATORY_CARE_PROVIDER_SITE_OTHER): Payer: Medicare Other | Admitting: Vascular Surgery

## 2018-10-15 DIAGNOSIS — I509 Heart failure, unspecified: Secondary | ICD-10-CM

## 2018-10-15 LAB — GLUCOSE, CAPILLARY: Glucose-Capillary: 202 mg/dL — ABNORMAL HIGH (ref 70–99)

## 2018-10-15 LAB — BASIC METABOLIC PANEL
Anion gap: 13 (ref 5–15)
BUN: 41 mg/dL — ABNORMAL HIGH (ref 8–23)
CO2: 27 mmol/L (ref 22–32)
Calcium: 8.8 mg/dL — ABNORMAL LOW (ref 8.9–10.3)
Chloride: 101 mmol/L (ref 98–111)
Creatinine, Ser: 1.68 mg/dL — ABNORMAL HIGH (ref 0.44–1.00)
GFR calc Af Amer: 32 mL/min — ABNORMAL LOW (ref >60)
GFR calc non Af Amer: 28 mL/min — ABNORMAL LOW (ref >60)
Glucose, Bld: 121 mg/dL — ABNORMAL HIGH (ref 70–99)
Potassium: 3.7 mmol/L (ref 3.5–5.1)
Sodium: 141 mmol/L (ref 135–145)

## 2018-10-15 MED ORDER — SODIUM CHLORIDE 0.9% FLUSH
10.0000 mL | Freq: Two times a day (BID) | INTRAVENOUS | Status: DC
  Administered 2018-10-15 – 2018-10-26 (×18): 10 mL

## 2018-10-15 MED ORDER — ALTEPLASE 2 MG IJ SOLR
2.0000 mg | Freq: Once | INTRAMUSCULAR | Status: AC
  Administered 2018-10-15: 07:00:00 2 mg
  Filled 2018-10-15: qty 2

## 2018-10-15 MED ORDER — SODIUM CHLORIDE 0.9% FLUSH
10.0000 mL | INTRAVENOUS | Status: DC | PRN
  Administered 2018-10-16 – 2018-10-21 (×2): 10 mL
  Administered 2018-10-24: 20 mL
  Administered 2018-10-26: 10 mL
  Filled 2018-10-15 (×4): qty 40

## 2018-10-15 MED ORDER — HALOPERIDOL LACTATE 5 MG/ML IJ SOLN
1.0000 mg | Freq: Four times a day (QID) | INTRAMUSCULAR | Status: DC | PRN
  Administered 2018-10-15 – 2018-10-26 (×6): 1 mg via INTRAVENOUS
  Filled 2018-10-15 (×6): qty 1

## 2018-10-15 NOTE — Care Management Important Message (Signed)
Important Message  Patient Details  Name: TASHAWN GRESSEL MRN: [id number] Date of Birth: 01-Jul-1934   Medicare Important Message Given:  Yes     Dannette Barbara 10/15/2018, 1:31 PM

## 2018-10-15 NOTE — Progress Notes (Addendum)
Tele sitter call nurse and states that pt just pulled something put her arm.On assessmentptt just pulled her IV out. Pt was hard to re-direct. No PRN meds to help with her anxiety. Notify prime. Will continue to monitor.  Update 2059: Dr. Dustin Flock ordered Haloperidol every 6 hours PRN to help with anxiety. Will continue to monitor.  Update 2115: Haloperidol given and as per CCMD QTC at 400. Will continue to monitor.

## 2018-10-15 NOTE — Progress Notes (Signed)
[place]  Telephone:([phone] [phone]   Name: Julia Gordon Date: 10/15/2018 MRN: [id number]  DOB: [birth year]  Patient Care Team: Ellamae Sia, MD as PCP - General (Internal Medicine) End, Harrell Gave, MD as PCP - Cardiology (Cardiology)    REASON FOR CONSULTATION: Palliative Care consult requested for this 84 y.o. female with multiple medical problems including history of stage IIIa adenocarcinoma of the left breast status post neoadjuvant chemotherapy and mastectomy on surveillance.  PMH also notable for COPD, history of DVT, PVD with bilateral lower extremity wounds, cardiomyopathy with an EF of 25 to 30% and history of CHF status post ICD, and persistent atrial fibrillation on Eliquis.  Patient was recently hospitalized 10/01/2018 to 10/08/2018 with altered mental status and was found to have a left frontal lobe stroke.  Patient was discharged to rehab and is now readmitted 10/13/2018 with CHF.  Palliative care was consulted to address goals.   CODE STATUS: Limited code  PAST MEDICAL HISTORY: Past Medical History:  Diagnosis Date   Anemia    Breast cancer ([place]) 2015   a. L mastectomy with chemoradiation   Colon adenocarcinoma (HCC)    COPD (chronic obstructive pulmonary disease) (HCC)    Depression    DJD (degenerative joint disease) of knee    right knee   DVT (deep venous thrombosis) ([place])    a. 06/2018 L popliteal and R peroneal DVTs-->Xarelto.   H/O hysterectomy with oophorectomy    for DUB   HFrEF (heart failure with reduced ejection fraction) ([place])    a. 06/2018 Echo: EF 25-30%.   History of depression    HLD (hyperlipidemia)    HTN (hypertension)    LV (left ventricular) mural thrombus    history of, resolved (echo 04/09)   NICM (nonischemic cardiomyopathy) ([place])    a. Reports prior h/o cath @ [facility]; b. status post [place] with [employer] lead in 2009 at [facility], Dr. Boyd Kerbs; c. 2014  - prev seen by G. Lovena Le, MD; c. 06/2018 Echo: EF 25-30%, DD. Diff HK. RVSP 31mmHg. Mildly dil RA. Mild to mod MR. Mod TR. Mild AI.   Persistent atrial fibrillation ([place])    a. noted in [place] from 2008-2009; b. CHADS2VASc 6 (CHF, HTN, age x 2, vascular disease, female)-->eliquis added 06/2018 in setting of LLE DVT.   Valvular heart disease    a. 06/2018 Echo: Mod TR, mild to mod MR, mild AI.    PAST SURGICAL HISTORY:  Past Surgical History:  Procedure Laterality Date   ABDOMINAL HYSTERECTOMY     BREAST BIOPSY Left 05/10/13   [place] bx-positive   COLONOSCOPY WITH PROPOFOL N/A 01/15/2017   Procedure: COLONOSCOPY WITH PROPOFOL;  Surgeon: Jonathon Bellows, MD;  Location: [facility] ENDOSCOPY;  Service: Gastroenterology;  Laterality: N/A;   LAPAROSCOPIC RIGHT COLON RESECTION  08/10   Dr Pat Patrick, for adenoca    LOWER EXTREMITY ANGIOGRAPHY Left 10/07/2018   Procedure: Lower Extremity Angiography;  Surgeon: Algernon Huxley, MD;  Location: [place] CV LAB;  Service: Cardiovascular;  Laterality: Left;   MASTECTOMY Left 2015   BREAST CA   OOPHORECTOMY     ORIF FEMUR FRACTURE Right 07/21/2017   Procedure: OPEN REDUCTION INTERNAL FIXATION (ORIF) DISTAL FEMUR FRACTURE;  Surgeon: Lovell Sheehan, MD;  Location: [facility] ORS;  Service: Orthopedics;  Laterality: Right;  femur    PACEMAKER INSERTION  2009   REPLACEMENT TOTAL KNEE  1990's   right     HEMATOLOGY/ONCOLOGY HISTORY:  Oncology History   No history exists.    ALLERGIES:  is allergic to no known allergies.  MEDICATIONS:  Current Facility-Administered Medications  Medication Dose Route Frequency Provider Last Rate Last Dose   acetaminophen (TYLENOL) tablet 650 mg  650 mg Oral Q6H PRN Epifanio Lesches, MD       Or   acetaminophen (TYLENOL) suppository 650 mg  650 mg Rectal Q6H PRN Epifanio Lesches, MD       aspirin EC tablet 81 mg  81 mg Oral Daily Epifanio Lesches, MD   81 mg at 10/15/18 0846   atorvastatin (LIPITOR) tablet  40 mg  40 mg Oral q1800 Epifanio Lesches, MD   40 mg at 10/14/18 1759   bisacodyl (DULCOLAX) EC tablet 5 mg  5 mg Oral Daily PRN Epifanio Lesches, MD       Chlorhexidine Gluconate Cloth 2 % PADS 6 each  6 each Topical Daily Pyreddy, Pavan, MD       furosemide (LASIX) injection 80 mg  80 mg Intravenous BID Epifanio Lesches, MD   80 mg at 10/15/18 1137   ipratropium-albuterol (DUONEB) 0.5-2.5 (3) MG/3ML nebulizer solution 3 mL  3 mL Nebulization Q6H Pyreddy, Reatha Harps, MD   3 mL at 10/15/18 0819   metoprolol succinate (TOPROL-XL) 24 hr tablet 50 mg  50 mg Oral Daily Epifanio Lesches, MD   50 mg at 10/15/18 0846   ondansetron (ZOFRAN) tablet 4 mg  4 mg Oral Q6H PRN Epifanio Lesches, MD       Or   ondansetron (ZOFRAN) injection 4 mg  4 mg Intravenous Q6H PRN Epifanio Lesches, MD       Rivaroxaban (XARELTO) tablet 15 mg  15 mg Oral Q supper Epifanio Lesches, MD   15 mg at 10/14/18 1822   sodium chloride flush (NS) 0.9 % injection 10-40 mL  10-40 mL Intracatheter Q12H Pyreddy, Reatha Harps, MD       sodium chloride flush (NS) 0.9 % injection 10-40 mL  10-40 mL Intracatheter PRN Saundra Shelling, MD       traMADol (ULTRAM) tablet 50 mg  50 mg Oral Q6H PRN Epifanio Lesches, MD   50 mg at 10/14/18 0924   traZODone (DESYREL) tablet 25 mg  25 mg Oral QHS PRN Epifanio Lesches, MD        VITAL SIGNS: BP 107/79    Pulse 83    Temp 98.4 F (36.9 C)    Resp 18    Ht 5\' 2"  (1.575 m)    Wt 170 lb 12.8 oz (77.5 kg)    SpO2 96%    BMI 31.24 kg/m  Filed Weights   10/13/18 1546 10/14/18 0259  Weight: 160 lb (72.6 kg) 170 lb 12.8 oz (77.5 kg)    Estimated body mass index is 31.24 kg/m as calculated from the following:   Height as of this encounter: 5\' 2"  (1.575 m).   Weight as of this encounter: 170 lb 12.8 oz (77.5 kg).  LABS: CBC:    Component Value Date/Time   WBC 8.2 10/14/2018 0810   HGB 12.3 10/14/2018 0810   HGB 12.7 12/13/2013 0623   HCT 37.7 10/14/2018 0810    HCT 38.4 12/13/2013 0623   PLT 205 10/14/2018 0810   PLT 133 (L) 12/13/2013 0623   MCV 93.1 10/14/2018 0810   MCV 95 12/13/2013 0623   NEUTROABS 5.6 10/13/2018 1816   NEUTROABS 4.1 12/13/2013 0623   LYMPHSABS 1.2 10/13/2018 1816   LYMPHSABS 1.2 12/13/2013 0623   MONOABS 0.7  10/13/2018 1816   MONOABS 0.5 12/13/2013 0623   EOSABS 0.0 10/13/2018 1816   EOSABS 0.1 12/13/2013 0623   BASOSABS 0.0 10/13/2018 1816   BASOSABS 0.1 12/13/2013 0623   Comprehensive Metabolic Panel:    Component Value Date/Time   NA 141 10/15/2018 0923   NA 143 09/02/2018 1229   NA 136 12/13/2013 0623   K 3.7 10/15/2018 0923   K 3.8 12/13/2013 0623   CL 101 10/15/2018 0923   CL 104 12/13/2013 0623   CO2 27 10/15/2018 0923   CO2 28 12/13/2013 0623   BUN 41 (H) 10/15/2018 0923   BUN 38 (H) 09/02/2018 1229   BUN 16 12/13/2013 0623   CREATININE 1.68 (H) 10/15/2018 0923   CREATININE 1.01 12/13/2013 0623   GLUCOSE 121 (H) 10/15/2018 0923   GLUCOSE 100 (H) 12/13/2013 0623   CALCIUM 8.8 (L) 10/15/2018 0923   CALCIUM 9.7 12/13/2013 0623   AST 29 10/13/2018 1816   AST 23 10/06/2013 1328   ALT 11 10/13/2018 1816   ALT 15 10/06/2013 1328   ALKPHOS 60 10/13/2018 1816   ALKPHOS 55 10/06/2013 1328   BILITOT 1.6 (H) 10/13/2018 1816   BILITOT 0.4 10/06/2013 1328   PROT 6.4 (L) 10/13/2018 1816   PROT 7.5 10/06/2013 1328   ALBUMIN 3.0 (L) 10/13/2018 1816   ALBUMIN 3.7 10/06/2013 1328    RADIOGRAPHIC STUDIES: Ct Head Wo Contrast  Result Date: 10/01/2018 CLINICAL DATA:  Altered mental status.  Patient on Xarelto. EXAM: CT HEAD WITHOUT CONTRAST TECHNIQUE: Contiguous axial images were obtained from the base of the skull through the vertex without intravenous contrast. COMPARISON:  October 04, 2006 FINDINGS: Brain: No subdural, epidural, or subarachnoid hemorrhage identified. Ventricles and sulci are prominent but stable. Cerebellum, brainstem, and basal cisterns are within normal limits. No mass effect or midline  shift. White matter changes are identified, particularly in the left frontal lobe such as on axial image 18. There appears to be mild involvement over the overlying cortex suggesting a small left frontal infarct. No other evidence of acute ischemia or infarct identified. Vascular: Calcified atherosclerosis is seen in the intracranial carotids. Skull: Normal. Negative for fracture or focal lesion. Sinuses/Orbits: No acute finding. Other: None. IMPRESSION: 1. Focal white matter changes are seen in the left frontal lobe with a small region of overlying cortical involvement consistent with infarct. These findings are age indeterminate but favored to be nonacute based on appearance. Recommend clinical correlation 2. No other acute abnormalities are identified. Electronically Signed   By: Dorise Bullion III M.D   On: 10/01/2018 16:42   US Carotid Bilateral (at [facility] Only)  Result Date: 10/02/2018 CLINICAL DATA:  84 year old female with a history of stroke EXAM: BILATERAL CAROTID DUPLEX ULTRASOUND TECHNIQUE: Pearline Cables scale imaging, color Doppler and duplex ultrasound were performed of bilateral carotid and vertebral arteries in the neck. COMPARISON:  None. FINDINGS: Criteria: Quantification of carotid stenosis is based on velocity parameters that correlate the residual internal carotid diameter with NASCET-based stenosis levels, using the diameter of the distal internal carotid lumen as the denominator for stenosis measurement. The following velocity measurements were obtained: RIGHT ICA:  Systolic 41 cm/sec, Diastolic 19 cm/sec CCA:  31 cm/sec SYSTOLIC ICA/CCA RATIO:  1.3 ECA:  32 cm/sec LEFT ICA:  Systolic 66 cm/sec, Diastolic 13 cm/sec CCA:  63 cm/sec SYSTOLIC ICA/CCA RATIO:  1.0 ECA:  59 cm/sec Right Brachial SBP: Not acquired Left Brachial SBP: Not acquired RIGHT CAROTID ARTERY: Calcification of the right common carotid  artery. Intermediate waveform maintained. Heterogeneous and partially calcified plaque at  the right carotid bifurcation. No significant lumen shadowing. Low resistance waveform of the right ICA. No significant tortuosity. RIGHT VERTEBRAL ARTERY: Antegrade flow with low resistance waveform. LEFT CAROTID ARTERY: Calcifications of the left common carotid artery. Intermediate waveform maintained. Heterogeneous and partially calcified plaque at the left carotid bifurcation without significant lumen shadowing. Low resistance waveform of the left ICA. No significant tortuosity. LEFT VERTEBRAL ARTERY:  Antegrade flow with low resistance waveform. IMPRESSION: Color duplex indicates moderate heterogeneous and calcified plaque, with no hemodynamically significant stenosis by duplex criteria in the extracranial cerebrovascular circulation. Signed, Dulcy Fanny. Dellia Nims, RPVI [facility] Radiology Electronically Signed   By: Corrie Mckusick D.O.   On: 10/02/2018 11:51   US Arterial Lower Extremity Duplex Bilateral  Result Date: 09/29/2018 CLINICAL DATA:  84 year old female with bilateral lower extremity ulcers for 1 month. Evaluate for underlying peripheral arterial disease. EXAM: BILATERAL LOWER EXTREMITY ARTERIAL DUPLEX SCAN TECHNIQUE: Gray-scale sonography as well as color Doppler and duplex ultrasound was performed to evaluate the arteries of both lower extremities including the common, superficial and profunda femoral arteries, popliteal artery and calf arteries. COMPARISON:  None. FINDINGS: Right Lower Extremity Inflow: Normal common femoral arterial waveforms and velocities. No evidence of inflow (aortoiliac) disease. Outflow: Normal profunda femoral, superficial femoral and popliteal arterial waveforms and velocities. No focal elevation of the PSV to suggest stenosis. Runoff: Normal posterior and anterior tibial arterial waveforms and velocities. Vessels are patent to the ankle. Left Lower Extremity Inflow: Normal common femoral arterial waveforms and  velocities. No evidence of inflow (aortoiliac) disease. Outflow: Normal profunda femoral, superficial femoral and popliteal arterial waveforms and velocities. No focal elevation of the PSV to suggest stenosis. Runoff: Normal posterior and anterior tibial arterial waveforms and velocities. Vessels are patent to the ankle. IMPRESSION: Minimal heterogeneous atherosclerotic plaque bilaterally without evidence of hemodynamically significant stenosis or occlusion. Signed, Criselda Peaches, MD, [place] [facility] Radiology Electronically Signed   By: Jacqulynn Cadet M.D.   On: 09/29/2018 10:04   Dg Chest Port 1 View  Result Date: 10/13/2018 CLINICAL DATA:  Difficulty breathing. EXAM: PORTABLE CHEST 1 VIEW COMPARISON:  10/01/2018 FINDINGS: At 1613 hours. The cardio pericardial silhouette is enlarged. The lungs are clear without focal pneumonia, edema, pneumothorax or pleural effusion. Streaky opacity at the right base again noted, likely atelectasis or scarring battery pack for left pacer/AICD obscures the left base. The visualized bony structures of the thorax are intact. Right Port-A-Cath again noted. Telemetry leads overlie the chest. IMPRESSION: Low volume film basilar atelectasis or scarring. Electronically Signed   By: Misty Stanley M.D.   On: 10/13/2018 16:35   Dg Chest Port 1 View  Result Date: 10/01/2018 CLINICAL DATA:  Sepsis. EXAM: PORTABLE CHEST 1 VIEW COMPARISON:  09/25/2018 FINDINGS: The heart is enlarged but stable. There is moderate tortuosity, ectasia and calcification of the thoracic aorta. The pacer wires/AICD is stable. The right IJ power port is stable. The lungs are clear of an acute process. Minimal right basilar scarring or atelectasis. The bony thorax is intact. IMPRESSION: 1. Stable cardiac enlargement and tortuous ectatic calcified thoracic aorta. 2. Streaky right basilar scarring or atelectasis but no infiltrates or effusions.  Electronically Signed   By: Marijo Sanes M.D.   On: 10/01/2018 16:36   Dg Chest Portable 1 View  Result Date: 09/25/2018 CLINICAL DATA:  Shortness of breath EXAM: PORTABLE CHEST 1 VIEW COMPARISON:  06/27/2018 FINDINGS: Right-sided chest port and left implanted cardiac device remain in place. Stable mild cardiomegaly. Calcified thoracic aorta. No focal airspace consolidation. No pleural effusion or pneumothorax. IMPRESSION: Stable mild cardiomegaly without acute cardiopulmonary process. Electronically Signed   By: Davina Poke M.D.   On: 09/25/2018 16:15    PERFORMANCE STATUS (ECOG) : 3 - Symptomatic, >50% confined to bed  Review of Systems Unless otherwise noted, a complete review of systems is negative.  Physical Exam General: NAD, frail appearing, thin, lying in bed Pulmonary: unlabored, on O2 Extremities: wounds noted Skin: no rashes Neurological: Weakness but otherwise nonfocal  IMPRESSION: Patient is more alert and responsive this AM. She is plesantly confused and pulled out her PAC overnight. This morning, she says I am asking her too many questions.   I called and spoke with patient's daughter. She also feels that patient is improved today and says she was not sure that patient was going to survive yesterday. Daughter seems to recognize that patient is at high risk for future hospitalizations and overall decline. She would be in agreement with palliative care following after discharge from the hospital. We also discussed the future option of hospice. Daughter says that her primary goal is to ensure that patient does not suffer but she remains committed to the idea of treating the treatable.   I discussed code status. Daughter says that patient told the ER staff that she would want CPR but not to let them place her on machines. I explained the probable futility associated with CPR, particularly with the decision not to intubate. Daughter said she would like some time to think about  decisions and talk to her brother and will let the medical team know over the weekend if family wanted to change code status.   PLAN: -Continue current scope of treatment -Recommend outpatient palliative care -Family considering code status   Patient expressed understanding and was in agreement with this plan. She also understands that She can call the clinic at any time with any questions, concerns, or complaints.     Time Total: 30 minutes  Visit consisted of counseling and education dealing with the complex and emotionally intense issues of symptom management and palliative care in the setting of serious and potentially life-threatening illness.Greater than 50%  of this time was spent counseling and coordinating care related to the above assessment and plan.  Signed by: Altha Harm, PhD, NP-C [phone] (Work Cell)

## 2018-10-15 NOTE — Progress Notes (Signed)
Patient was kept on low bed, she is A&O X2-3. She refuseed care RN from assessing her right upper chest port. She remained on 3L of supplemental oxygen overnight.  Patient has televideo monitor for safety.

## 2018-10-15 NOTE — Progress Notes (Signed)
Physical Therapy Evaluation Patient Details Name: Julia Gordon MRN: [id number] DOB: [birth year] Today's Date: 10/15/2018   History of Present Illness  Julia Gordon  is a 84 y.o. female presented to hospital ED 10/13/2018 through EMS from [facility]) because of shortness of breath, edema of extremities.  Patient admitted to hospital for acute on chronic systolic heart failure, R leg DVT, history of chronic A-fib, acute on chronic renal failure with CKD stage II, PVD with bilateral LE ulcers. Patient was seen by vascular surgery during the last admission with angioplasty of the L leg.  Patient usually on 2 L of oxygen all the time but at nursing home because of shortness of breath patient needed a higher amount of oxygen up to 4 L. Relevant PMH includes L frontal lobe stroke, PVD, EF less than 20%, pacemaker, breast cancer, COPD, R femur fracture (2019). Chest radiograph shows low volume film basilar atelectasis or scarring.    Clinical Impression  Patient confused and unable to provide reliable history. History taken from prior documentation including from recent PT evaluation during hospitalization 1 week ago. This hospitalization patient arrived from SNF. Upon physical therapy evaluation, patient required max A for bed mobility and was unable to transfer safely without equipment assist. Patient was limited by pain, weakness, and confusion. Unable to measure SpO2 during session after several attempts with hospital pulse ox. Monitored breathing which became slightly labored with effort but improved with rest. HR remained WFL during session. Patient appears to have experienced further decline in functional independence/strength and would benefit from short term rehab prior to returning home. Patient would benefit from physical therapy to address remaining impairments and functional limitations (see PT problem list below) to work towards stated goals and return to PLOF or maximal functional  independence.      Follow Up Recommendations SNF;Supervision/Assistance - 24 hour    Equipment Recommendations  Rolling walker with 5" wheels;3in1 (PT);Wheelchair (measurements PT);Wheelchair cushion (measurements PT)(may already have from previous admission)    Recommendations for Other Services OT consult     Precautions / Restrictions Precautions Precautions: Fall Precaution Comments: R chest port; B LE wounds Restrictions Weight Bearing Restrictions: No      Mobility  Bed Mobility Overal bed mobility: Needs Assistance Bed Mobility: Sit to Supine;Supine to Sit Rolling: Mod assist(holding bed rails)   Supine to sit: Max assist;HOB elevated Sit to supine: Total assist   General bed mobility comments: unable to roll supine to side or use BUE to push/pull to get to sitting. Required assistance to move feet on and off bed. Very slumped posture in sitting. Max A +2 scoot in bed with draw sheet (respiratory assisting). Unable to scoot forward in sitting without max A.  Transfers Overall transfer level: Needs assistance Equipment used: None;Rolling walker (2 wheeled) Transfers: Stand Pivot Transfers Sit to Stand: Max assist         General transfer comment: multiple attempts to perform sit <> stand from elevated bed to RW but unable. Has trouble maintaining trunk control. Attempted sit <> stand or squat pivot transfer max A without AD but patient unable and complains of pain.  Ambulation/Gait             General Gait Details: not appropriate at this time  Stairs            Wheelchair Mobility    Modified Rankin (Stroke Patients Only)       Balance Overall balance assessment: Needs assistance Sitting-balance support: Feet supported;Bilateral upper extremity  supported Sitting balance-Leahy Scale: Poor Sitting balance - Comments: frequent leaning to R, unable to maintain sitting balance for more than a few secons. Postural control: Right lateral lean    Standing balance-Leahy Scale: Zero Standing balance comment: unable to stand                             Pertinent Vitals/Pain Pain Assessment: Faces Pain Score: 2  Faces Pain Scale: Hurts even more Pain Location: when touching L LE (lower leg and ankle) Pain Descriptors / Indicators: Grimacing;Guarding Pain Intervention(s): Limited activity within patient's tolerance;Monitored during session;Repositioned;RN gave pain meds during session    Home Living Family/patient expects to be discharged to:: Skilled nursing facility(Patient arrived from SNF this hospitalization.) Living Arrangements: Other relatives Available Help at Discharge: Family;Available 24 hours/day Type of Home: House Home Access: Stairs to enter Entrance Stairs-Rails: Left Entrance Stairs-Number of Steps: 3 Home Layout: One level Home Equipment: Walker - 2 wheels Additional Comments: Patient confused and unable to provide history. PLOF and home living information obtained from previous documentation during hospitalization last week.    Prior Function Level of Independence: Independent with assistive device(s)         Comments: Per previous documentation at hospitalization 1 week ago, according to pt daughter, pt was independent with ADLs/IADLs prior to last hospitalization. Occasionally uses 2WW for amb. Patient unable to provide updated information this admission.     Hand Dominance   Dominant Hand: Right    Extremity/Trunk Assessment   Upper Extremity Assessment Upper Extremity Assessment: RUE deficits/detail;LUE deficits/detail RUE Deficits / Details: Patient unable to fully roll supine to side or push/pull self up to sitting. LUE Deficits / Details: Patient unable to fully roll supine to side or push/pull self up to sitting.    Lower Extremity Assessment Lower Extremity Assessment: RLE deficits/detail;LLE deficits/detail RLE Deficits / Details: Patient unable to come to standing from  elevated bed related to LE weakness. needs help moving feet off bed. Needs assistance to move feet to bed. LLE Deficits / Details: Patient unable to come to standing from elevated bed related to LE weakness. needs help moving feet off bed. Needs assistance to move feet to bed.    Cervical / Trunk Assessment Cervical / Trunk Assessment: Kyphotic  Communication   Communication: No difficulties  Cognition Arousal/Alertness: Awake/alert Behavior During Therapy: Anxious;Agitated(worried female is in room. confused. (telesitter in room).) Overall Cognitive Status: No family/caregiver present to determine baseline cognitive functioning Area of Impairment: Orientation;Following commands;Memory                 Orientation Level: Disoriented to;Place;Time;Situation;Person(unable to provide DOB but able to report age)     Following Commands: Follows one step commands with increased time;Follows one step commands inconsistently       General Comments: patient confused and forgetful during session. Inappropriate response to situation at times.      General Comments      Exercises Other Exercises Other Exercises: Pt assisted with bed mobility as well as attempts to complete transfer and sitting at EOB for several minutes.   Assessment/Plan    PT Assessment Patient needs continued PT services  PT Problem List Decreased strength;Decreased activity tolerance;Decreased balance;Decreased mobility;Decreased knowledge of use of DME;Decreased safety awareness;Decreased cognition;Decreased knowledge of precautions;Pain;Decreased skin integrity;Obesity;Decreased range of motion       PT Treatment Interventions DME instruction;Gait training;Stair training;Functional mobility training;Therapeutic activities;Therapeutic exercise;Balance training;Patient/family education;Cognitive remediation;Neuromuscular re-education    PT  Goals (Current goals can be found in the Care Plan section)  Acute Rehab PT  Goals Patient Stated Goal: To go home PT Goal Formulation: With patient Time For Goal Achievement: 10/29/18 Potential to Achieve Goals: Fair    Frequency Min 2X/week   Barriers to discharge Decreased caregiver support;Inaccessible home environment patient requires 24/7 care and heavy assist, significant mobility limtations    Co-evaluation               AM-PAC PT "6 Clicks" Mobility  Outcome Measure Help needed turning from your back to your side while in a flat bed without using bedrails?: A Lot Help needed moving from lying on your back to sitting on the side of a flat bed without using bedrails?: A Lot Help needed moving to and from a bed to a chair (including a wheelchair)?: Total Help needed standing up from a chair using your arms (e.g., wheelchair or bedside chair)?: Total Help needed to walk in hospital room?: Total Help needed climbing 3-5 steps with a railing? : Total 6 Click Score: 8    End of Session Equipment Utilized During Treatment: Gait belt;Oxygen(2L/min) Activity Tolerance: Patient tolerated treatment well;Patient limited by pain;Patient limited by fatigue Patient left: in bed;with call bell/phone within reach;with bed alarm set;with nursing/sitter in room;Other (comment)(B heels floating via pillow, telesitter in room) Nurse Communication: Mobility status(nurse present during entire session) PT Visit Diagnosis: Muscle weakness (generalized) (M62.81);Difficulty in walking, not elsewhere classified (R26.2);Pain Pain - Right/Left: Left Pain - part of body: Leg    Time: 1419-1450 PT Time Calculation (min) (ACUTE ONLY): 31 min   Charges:   PT Evaluation $PT Eval Moderate Complexity: 1 Mod PT Treatments $Therapeutic Activity: 8-22 mins        Everlean Alstrom. Graylon Good, PT, DPT 10/15/18, 3:20 PM

## 2018-10-15 NOTE — Progress Notes (Signed)
[place] [facility] [place] NAME: Julia Gordon    MR#:  [id number]  DATE OF BIRTH:  04/06/1934  SUBJECTIVE:  CHIEF COMPLAINT:   Chief Complaint  Patient presents with  . Shortness of Breath  Patient seen and evaluated today Was agitated last evening Has shortness of breath and on oxygen via nasal cannula No chest pain No fever  Pulled out Porta cath accidentally yesterday  REVIEW OF SYSTEMS:    ROS  CONSTITUTIONAL: No documented fever. Has fatigue, weakness. No weight gain, no weight loss.  EYES: No blurry or double vision.  ENT: No tinnitus. No postnasal drip. No redness of the oropharynx.  RESPIRATORY: No cough, no wheeze, no hemoptysis. Has dyspnea.  CARDIOVASCULAR: No chest pain. No orthopnea. No palpitations. No syncope.  GASTROINTESTINAL: No nausea, no vomiting or diarrhea. No abdominal pain. No melena or hematochezia.  GENITOURINARY: No dysuria or hematuria.  ENDOCRINE: No polyuria or nocturia. No heat or cold intolerance.  HEMATOLOGY: No anemia. No bruising. No bleeding.  INTEGUMENTARY: No rashes. No lesions.  MUSCULOSKELETAL: No arthritis. No swelling. No gout.  NEUROLOGIC: No numbness, tingling, or ataxia. No seizure-type activity.  PSYCHIATRIC: No anxiety. No insomnia. No ADD.   DRUG ALLERGIES:   Allergies  Allergen Reactions  . No Known Allergies     VITALS:  Blood pressure 112/83, pulse 83, temperature 98.4 F (36.9 C), resp. rate 18, height 5\' 2"  (1.575 m), weight 77.5 kg, SpO2 96 %.  PHYSICAL EXAMINATION:   Physical Exam  GENERAL:  84 y.o.-year-old patient lying in the bed with no acute distress.  EYES: Pupils equal, round, reactive to light and accommodation. No scleral icterus. Extraocular muscles intact.  HEENT: Head atraumatic, normocephalic. Oropharynx and nasopharynx clear.  NECK:  Supple, no jugular venous distention. No thyroid enlargement, no tenderness.  LUNGS: Decreased breath sounds bilaterally,  bibasilar creps heard. No use of accessory muscles of respiration.  CARDIOVASCULAR: S1, S2 normal. No murmurs, rubs, or gallops.  ABDOMEN: Soft, nontender, nondistended. Bowel sounds present. No organomegaly or mass.  EXTREMITIES: No cyanosis, clubbing  has edema b/l.    NEUROLOGIC: Cranial nerves II through XII are intact. No focal Motor or sensory deficits b/l.   PSYCHIATRIC: The patient is alert and oriented x 2.  SKIN: Has lower extremity wounds       LABORATORY PANEL:   CBC Recent Labs  Lab 10/14/18 0810  WBC 8.2  HGB 12.3  HCT 37.7  PLT 205   ------------------------------------------------------------------------------------------------------------------ Chemistries  Recent Labs  Lab 10/13/18 1816 10/14/18 0810  NA 139 141  K 4.0 4.6  CL 103 102  CO2 22 28  GLUCOSE 106* 86  BUN 37* 40*  CREATININE 1.33* 1.57*  CALCIUM 8.8* 8.9  AST 29  --   ALT 11  --   ALKPHOS 60  --   BILITOT 1.6*  --    ------------------------------------------------------------------------------------------------------------------  Cardiac Enzymes No results for input(s): TROPONINI in the last 168 hours. ------------------------------------------------------------------------------------------------------------------  RADIOLOGY:  Dg Chest Port 1 View  Result Date: 10/13/2018 CLINICAL DATA:  Difficulty breathing. EXAM: PORTABLE CHEST 1 VIEW COMPARISON:  10/01/2018 FINDINGS: At 1613 hours. The cardio pericardial silhouette is enlarged. The lungs are clear without focal pneumonia, edema, pneumothorax or pleural effusion. Streaky opacity at the right base again noted, likely atelectasis or scarring battery pack for left pacer/AICD obscures the left base. The visualized bony structures of the thorax are intact. Right Port-A-Cath again noted. Telemetry leads overlie the chest. IMPRESSION: Low  volume film basilar atelectasis or scarring. Electronically Signed   By: Misty Stanley M.D.   On:  10/13/2018 16:35     ASSESSMENT AND PLAN:   84 year old elderly female patient with history of breast cancer, colon cancer, history of DVT on oxygen at nursing home, COPD, congestive heart failure with reduced ejection fraction of EF of 25 to 30%, hypertension, hyperlipidemia currently under hospitalist service  -Acute on chronic systolic heart failure exacerbation Continue IV Lasix for diuresis currently on 80 mg twice daily Monitor electrolytes Input output chart and daily body weights Oxygen via nasal cannula Continue Entresto if renal function permits and Aldactone  -History of right leg DVT Continue anticoagulation with Xarelto  -Peripheral vascular disease but bilateral lower extremity ulcers Status post angioplasty left leg during last admission by vascular surgery Wound care team to continue for dressing changes  -Chronic atrial fibrillation Continue rate control with beta-blocker Continue Xarelto for anticoagulation  -Acute on chronic kidney disease stage III Monitor renal function as patient is on diuretics  -Palliative care service on board  -Long-term prognosis poor  -History of breast and colon cancer Supportive care   All the records are reviewed and case discussed with Care Management/Social Worker. Management plans discussed with the patient, family and they are in agreement.  CODE STATUS: Partial code  DVT Prophylaxis: SCDs  TOTAL TIME TAKING CARE OF THIS PATIENT: 36 minutes.   POSSIBLE D/C IN 2 to 3 DAYS, DEPENDING ON CLINICAL CONDITION.  Saundra Shelling M.D on 10/15/2018 at 10:43 AM  Between 7am to 6pm - Pager - [phone]  After 6pm go to [email or web address] - password [email or web address] [place] [facility]  Office  [phone]  CC: Primary care physician; Ellamae Sia, MD  Note: This dictation was prepared with Dragon dictation along with smaller phrase technology. Any transcriptional errors that result from this process are  unintentional.

## 2018-10-15 NOTE — Progress Notes (Signed)
Patient chest port was accidentally pulled by patient. She refused Am lab blood drawn.

## 2018-10-16 ENCOUNTER — Inpatient Hospital Stay: Payer: Medicare Other

## 2018-10-16 LAB — BASIC METABOLIC PANEL
Anion gap: 11 (ref 5–15)
BUN: 39 mg/dL — ABNORMAL HIGH (ref 8–23)
CO2: 28 mmol/L (ref 22–32)
Calcium: 8.3 mg/dL — ABNORMAL LOW (ref 8.9–10.3)
Chloride: 102 mmol/L (ref 98–111)
Creatinine, Ser: 1.33 mg/dL — ABNORMAL HIGH (ref 0.44–1.00)
GFR calc Af Amer: 43 mL/min — ABNORMAL LOW (ref >60)
GFR calc non Af Amer: 37 mL/min — ABNORMAL LOW (ref >60)
Glucose, Bld: 115 mg/dL — ABNORMAL HIGH (ref 70–99)
Potassium: 3.5 mmol/L (ref 3.5–5.1)
Sodium: 141 mmol/L (ref 135–145)

## 2018-10-16 MED ORDER — SACUBITRIL-VALSARTAN 24-26 MG PO TABS
1.0000 | ORAL_TABLET | Freq: Two times a day (BID) | ORAL | Status: DC
  Administered 2018-10-16 – 2018-10-26 (×14): 1 via ORAL
  Filled 2018-10-16 (×17): qty 1

## 2018-10-16 MED ORDER — IPRATROPIUM-ALBUTEROL 0.5-2.5 (3) MG/3ML IN SOLN
3.0000 mL | Freq: Three times a day (TID) | RESPIRATORY_TRACT | Status: DC
  Administered 2018-10-16 – 2018-10-22 (×18): 3 mL via RESPIRATORY_TRACT
  Filled 2018-10-16 (×18): qty 3

## 2018-10-16 MED ORDER — FUROSEMIDE 10 MG/ML IJ SOLN
40.0000 mg | Freq: Two times a day (BID) | INTRAMUSCULAR | Status: DC
  Administered 2018-10-16 – 2018-10-17 (×2): 40 mg via INTRAVENOUS
  Filled 2018-10-16 (×2): qty 4

## 2018-10-16 NOTE — Plan of Care (Signed)
  Problem: Safety: Goal: Ability to remain free from injury will improve Outcome: Progressing   

## 2018-10-16 NOTE — Plan of Care (Signed)
  Problem: Coping: Goal: Level of anxiety will decrease Outcome: Not Progressing Note: Pt agitated and refusing care.  Medicated with Haldol IV prn with good effect.    Problem: Education: Goal: Knowledge of General Education information will improve Description: Including pain rating scale, medication(s)/side effects and non-pharmacologic comfort measures Outcome: Progressing   Problem: Health Behavior/Discharge Planning: Goal: Ability to manage health-related needs will improve Outcome: Progressing   Problem: Clinical Measurements: Goal: Ability to maintain clinical measurements within normal limits will improve Outcome: Progressing Goal: Will remain free from infection Outcome: Progressing Goal: Diagnostic test results will improve Outcome: Progressing Goal: Respiratory complications will improve Outcome: Progressing Goal: Cardiovascular complication will be avoided Outcome: Progressing   Problem: Activity: Goal: Risk for activity intolerance will decrease Outcome: Progressing   Problem: Nutrition: Goal: Adequate nutrition will be maintained Outcome: Progressing   Problem: Elimination: Goal: Will not experience complications related to bowel motility Outcome: Progressing Goal: Will not experience complications related to urinary retention Outcome: Progressing   Problem: Pain Managment: Goal: General experience of comfort will improve Outcome: Progressing   Problem: Safety: Goal: Ability to remain free from injury will improve Outcome: Progressing   Problem: Skin Integrity: Goal: Risk for impaired skin integrity will decrease Outcome: Progressing

## 2018-10-16 NOTE — Progress Notes (Signed)
Pt refusing to have CBG completed.  Pt refusing to wear oxygen via [place].

## 2018-10-16 NOTE — Progress Notes (Signed)
[facility] at [place] NAME: Julia Gordon    MR#:  [id number]  DATE OF BIRTH:  1934-04-13  SUBJECTIVE:  CHIEF COMPLAINT:   Chief Complaint  Patient presents with  . Shortness of Breath  Very confused.  REVIEW OF SYSTEMS:    Review of Systems  Unable to perform ROS: Dementia     Allergies  Allergen Reactions  . No Known Allergies     VITALS:  Blood pressure 120/83, pulse 96, temperature 97.7 F (36.5 C), temperature source Oral, resp. rate 20, height 5\' 2"  (1.575 m), weight 77.1 kg, SpO2 100 %.  PHYSICAL EXAMINATION:   Physical Exam  GENERAL:  84 y.o.-year-old patient lying in the bed with no acute distress.  EYES: Pupils equal, round, reactive to light and accommodation. No scleral icterus. Extraocular muscles intact.  HEENT: Head atraumatic, normocephalic. Oropharynx and nasopharynx clear.  NECK:  Supple, no jugular venous distention. No thyroid enlargement, no tenderness.  LUNGS: Decreased breath sounds bilaterally, bibasilar creps heard. No use of accessory muscles of respiration.  CARDIOVASCULAR: S1, S2 normal. No murmurs, rubs, or gallops.  ABDOMEN: Soft, nontender, nondistended. Bowel sounds present. No organomegaly or mass.  EXTREMITIES: No cyanosis, clubbing  has edema b/l.    NEUROLOGIC: Cranial nerves II through XII are intact. No focal Motor or sensory deficits b/l.   PSYCHIATRIC: The patient is alert and oriented x 2.  SKIN: Has lower extremity wounds       LABORATORY PANEL:   CBC Recent Labs  Lab 10/14/18 0810  WBC 8.2  HGB 12.3  HCT 37.7  PLT 205   ------------------------------------------------------------------------------------------------------------------ Chemistries  Recent Labs  Lab 10/13/18 1816  10/16/18 0439  NA 139   < > 141  K 4.0   < > 3.5  CL 103   < > 102  CO2 22   < > 28  GLUCOSE 106*   < > 115*  BUN 37*   < > 39*  CREATININE 1.33*   < > 1.33*  CALCIUM 8.8*   < > 8.3*  AST  29  --   --   ALT 11  --   --   ALKPHOS 60  --   --   BILITOT 1.6*  --   --    < > = values in this interval not displayed.   ------------------------------------------------------------------------------------------------------------------  Cardiac Enzymes No results for input(s): TROPONINI in the last 168 hours. ------------------------------------------------------------------------------------------------------------------  RADIOLOGY:  Dg Chest Port 1 View  Result Date: 10/16/2018 CLINICAL DATA:  Acute exacerbation of congestive heart failure. EXAM: PORTABLE CHEST 1 VIEW COMPARISON:  October 13, 2018. FINDINGS: Stable cardiomegaly. Stable position of left-sided pacemaker. Right internal jugular Port-A-Cath is unchanged in position. No pneumothorax or pleural effusion is noted. Minimal bibasilar subsegmental atelectasis. Bony thorax is unremarkable. IMPRESSION: Minimal bibasilar subsegmental atelectasis. Electronically Signed   By: Marijo Conception M.D.   On: 10/16/2018 09:35     ASSESSMENT AND PLAN:   84 year old elderly female patient with history of breast cancer, colon cancer, history of DVT on oxygen at nursing home, COPD, congestive heart failure with reduced ejection fraction of EF of 25 to 30%, hypertension, hyperlipidemia currently under hospitalist service  -Acute on chronic systolic heart failure exacerbation, EF 20 to 25%. C clinically improving.  Change the dose of IV Lasix patient urine is concentrated, now may be approaching euvolemia   -History of right leg DVT Continue anticoagulation with Xarelto  -Peripheral vascular disease but bilateral  lower extremity ulcers Status post angioplasty left leg during last admission by vascular surgery Wound care team to continue for dressing changes  -Chronic atrial fibrillation Continue rate control with beta-blocker Continue Xarelto for anticoagulation  -Acute on chronic kidney disease stage III Monitor renal function as  patient is on diuretics  -Palliative care service on board  -Long-term prognosis poor  -History of breast and colon cancer Supportive care   All the records are reviewed and case discussed with Care Management/Social Worker. Management plans discussed with the patient, family and they are in agreement.  CODE STATUS: Partial code  DVT Prophylaxis: SCDs  TOTAL TIME TAKING CARE OF THIS PATIENT: 36 minutes.   POSSIBLE D/C IN 2 to 3 DAYS, DEPENDING ON CLINICAL CONDITION.  Epifanio Lesches M.D on 10/16/2018 at 1:32 PM  Between 7am to 6pm - Pager - [phone]  After 6pm go to [email or web address] - password [email or web address] [place] [facility]  Office  [phone]  CC: Primary care physician; Ellamae Sia, MD  Note: This dictation was prepared with Dragon dictation along with smaller phrase technology. Any transcriptional errors that result from this process are unintentional.

## 2018-10-17 LAB — CBC
HCT: 36.9 % (ref 36.0–46.0)
Hemoglobin: 11.9 g/dL — ABNORMAL LOW (ref 12.0–15.0)
MCH: 30.5 pg (ref 26.0–34.0)
MCHC: 32.2 g/dL (ref 30.0–36.0)
MCV: 94.6 fL (ref 80.0–100.0)
Platelets: 204 10*3/uL (ref 150–400)
RBC: 3.9 MIL/uL (ref 3.87–5.11)
RDW: 16.6 % — ABNORMAL HIGH (ref 11.5–15.5)
WBC: 7.1 10*3/uL (ref 4.0–10.5)
nRBC: 0.3 % — ABNORMAL HIGH (ref 0.0–0.2)

## 2018-10-17 LAB — BASIC METABOLIC PANEL
Anion gap: 11 (ref 5–15)
BUN: 35 mg/dL — ABNORMAL HIGH (ref 8–23)
CO2: 27 mmol/L (ref 22–32)
Calcium: 8.2 mg/dL — ABNORMAL LOW (ref 8.9–10.3)
Chloride: 103 mmol/L (ref 98–111)
Creatinine, Ser: 1.27 mg/dL — ABNORMAL HIGH (ref 0.44–1.00)
GFR calc Af Amer: 46 mL/min — ABNORMAL LOW (ref >60)
GFR calc non Af Amer: 39 mL/min — ABNORMAL LOW (ref >60)
Glucose, Bld: 117 mg/dL — ABNORMAL HIGH (ref 70–99)
Potassium: 3.2 mmol/L — ABNORMAL LOW (ref 3.5–5.1)
Sodium: 141 mmol/L (ref 135–145)

## 2018-10-17 LAB — GLUCOSE, CAPILLARY: Glucose-Capillary: 116 mg/dL — ABNORMAL HIGH (ref 70–99)

## 2018-10-17 MED ORDER — POTASSIUM CHLORIDE CRYS ER 20 MEQ PO TBCR
40.0000 meq | EXTENDED_RELEASE_TABLET | Freq: Once | ORAL | Status: AC
  Administered 2018-10-17: 40 meq via ORAL
  Filled 2018-10-17: qty 2

## 2018-10-17 MED ORDER — FUROSEMIDE 20 MG PO TABS
20.0000 mg | ORAL_TABLET | Freq: Two times a day (BID) | ORAL | Status: DC
  Administered 2018-10-18 – 2018-10-20 (×6): 20 mg via ORAL
  Filled 2018-10-17 (×6): qty 1

## 2018-10-17 NOTE — Progress Notes (Addendum)
Pt dressing was change today d/t leaking. Waiting for wound care nurse to assess pt. Notify incoming shift.  Will continue to monitor.

## 2018-10-17 NOTE — Progress Notes (Addendum)
On rounding pt just took her port cath out. Portcath was intact. IV team consult was place. Will continue to monitor.  Update 0104: Port cath was now working and access. Haloperidol given to help pt not keep pulling out lines. Will notify. CCMD was called and reported that pt QTC at 460. Will continue to monitor

## 2018-10-17 NOTE — Progress Notes (Signed)
[place] at [place] NAME: Julia Gordon    MR#:  [id number]  DATE OF BIRTH:  1934/09/15  SUBJECTIVE: Patient denies any shortness of breath and appears comfortable.  CHIEF COMPLAINT:   Chief Complaint  Patient presents with  . Shortness of Breath  Very confused.  REVIEW OF SYSTEMS:    Review of Systems  Unable to perform ROS: Dementia     Allergies  Allergen Reactions  . No Known Allergies     VITALS:  Blood pressure 122/88, pulse 91, temperature 97.8 F (36.6 C), temperature source Oral, resp. rate 18, height 5\' 2"  (1.575 m), weight 69.9 kg, SpO2 100 %.  PHYSICAL EXAMINATION:   Physical Exam  GENERAL:  84 y.o.-year-old patient lying in the bed with no acute distress.  EYES: Pupils equal, round, reactive to light and accommodation. No scleral icterus. Extraocular muscles intact.  HEENT: Head atraumatic, normocephalic. Oropharynx and nasopharynx clear.  NECK:  Supple, no jugular venous distention. No thyroid enlargement, no tenderness.  LUNGS: Decreased breath sounds bilaterally, bibasilar creps heard. No use of accessory muscles of respiration.  CARDIOVASCULAR: S1, S2 normal. No murmurs, rubs, or gallops.  ABDOMEN: Soft, nontender, nondistended. Bowel sounds present. No organomegaly or mass.  EXTREMITIES: No cyanosis, clubbing  has edema b/l.    NEUROLOGIC: Cranial nerves II through XII are intact. No focal Motor or sensory deficits b/l.   PSYCHIATRIC: The patient is alert and oriented x 2.  SKIN: Has lower extremity wounds       LABORATORY PANEL:   CBC Recent Labs  Lab 10/17/18 0515  WBC 7.1  HGB 11.9*  HCT 36.9  PLT 204   ------------------------------------------------------------------------------------------------------------------ Chemistries  Recent Labs  Lab 10/13/18 1816  10/17/18 0520  NA 139   < > 141  K 4.0   < > 3.2*  CL 103   < > 103  CO2 22   < > 27  GLUCOSE 106*   < > 117*  BUN 37*   < >  35*  CREATININE 1.33*   < > 1.27*  CALCIUM 8.8*   < > 8.2*  AST 29  --   --   ALT 11  --   --   ALKPHOS 60  --   --   BILITOT 1.6*  --   --    < > = values in this interval not displayed.   ------------------------------------------------------------------------------------------------------------------  Cardiac Enzymes No results for input(s): TROPONINI in the last 168 hours. ------------------------------------------------------------------------------------------------------------------  RADIOLOGY:  Dg Chest Port 1 View  Result Date: 10/16/2018 CLINICAL DATA:  Acute exacerbation of congestive heart failure. EXAM: PORTABLE CHEST 1 VIEW COMPARISON:  October 13, 2018. FINDINGS: Stable cardiomegaly. Stable position of left-sided pacemaker. Right internal jugular Port-A-Cath is unchanged in position. No pneumothorax or pleural effusion is noted. Minimal bibasilar subsegmental atelectasis. Bony thorax is unremarkable. IMPRESSION: Minimal bibasilar subsegmental atelectasis. Electronically Signed   By: Marijo Conception M.D.   On: 10/16/2018 09:35     ASSESSMENT AND PLAN:   84 year old elderly female patient with history of breast cancer, colon cancer, history of DVT on oxygen at nursing home, COPD, congestive heart failure with reduced ejection fraction of EF of 25 to 30%, hypertension, hyperlipidemia currently under hospitalist service  -Acute on chronic systolic heart failure exacerbation, EF 20 to 25%.  clinically improving.  Transition to oral Lasix today.  -History of right leg DVT Continue anticoagulation with Xarelto, has left leg  -Peripheral vascular disease but  bilateral lower extremity ulcers Status post angioplasty left leg during last admission by vascular surgery Wound care team to continue for dressing changes   -Chronic atrial fibrillation Continue rate control with beta-blocker Continue Xarelto for anticoagulation  -Acute on chronic kidney disease stage III Monitor  renal function as patient is on diuretics  -Palliative care service on board  -Long-term prognosis poor  -History of breast and colon cancer Supportive care Chronic respiratory failure, patient on 3 L of oxygen all the time.  Continue with oxygen. Disposition; back to nursing home hopefully tomorrow. All the records are reviewed and case discussed with Care Management/Social Worker. Management plans discussed with the patient, family and they are in agreement.  CODE STATUS: Partial code  DVT Prophylaxis: SCDs  TOTAL TIME TAKING CARE OF THIS PATIENT: 36 minutes.   POSSIBLE D/C IN 2 to 3 DAYS, DEPENDING ON CLINICAL CONDITION.  Epifanio Lesches M.D on 10/17/2018 at 12:05 PM  Between 7am to 6pm - Pager - [phone]  After 6pm go to [email or web address] - password [email or web address] [facility]  Office  [phone]  CC: Primary care physician; Ellamae Sia, MD  Note: This dictation was prepared with Dragon dictation along with smaller phrase technology. Any transcriptional errors that result from this process are unintentional.

## 2018-10-17 NOTE — Plan of Care (Signed)
  Problem: Clinical Measurements: Goal: Will remain free from infection Outcome: Progressing   Problem: Coping: Goal: Level of anxiety will decrease Outcome: Progressing   Problem: Safety: Goal: Ability to remain free from injury will improve Outcome: Progressing   

## 2018-10-17 NOTE — Progress Notes (Signed)
Pt was pulling lines again. Haloperidol was given and CCMD was called for OTC and is at 390. Will continue to monitor.

## 2018-10-17 NOTE — Plan of Care (Signed)
  Problem: Clinical Measurements: Goal: Respiratory complications will improve Outcome: Progressing   Problem: Safety: Goal: Ability to remain free from injury will improve Outcome: Progressing   

## 2018-10-18 LAB — NOVEL CORONAVIRUS, NAA (HOSP ORDER, SEND-OUT TO REF LAB; TAT 18-24 HRS): SARS-CoV-2, NAA: NOT DETECTED

## 2018-10-18 LAB — GLUCOSE, CAPILLARY: Glucose-Capillary: 106 mg/dL — ABNORMAL HIGH (ref 70–99)

## 2018-10-18 MED ORDER — SILVER SULFADIAZINE 1 % EX CREA
TOPICAL_CREAM | Freq: Two times a day (BID) | CUTANEOUS | Status: DC
  Administered 2018-10-18 – 2018-10-24 (×7): via TOPICAL
  Filled 2018-10-18: qty 85

## 2018-10-18 NOTE — Consult Note (Signed)
[place] Nurse wound consult note Patient receiving care in [facility] 250.  Patient's LLE is very painful to touch, limiting wound examination and measuring. Reason for Consult: LLE wounds Wound type: highly likely related to insufficient arterial blood flow. Pressure Injury POA: Yes Measurement: The left achilles tendon area is 100% necrotic. 50% black eschar, 50% yellow slough, moderate drainage.  The left lateral lower leg has two large wounds black and yellow, moderate exudate, malodorous. Dressing procedure/placement/frequency: BID gently cleanse LLE wounds with soap and water. Pat dry. Liberally apply the silvadene. Cover with telfa pads, secure with kerlex. I sent a SecureChat message to Dr. Governor Specking asking if the plan is consult surgery for possible amputation. Monitor the wound area(s) for worsening of condition such as: Signs/symptoms of infection,  Increase in size,  Development of or worsening of odor, Development of pain, or increased pain at the affected locations.  Notify the medical team if any of these develop.  Thank you for the consult.  Discussed plan of care with the patient and bedside nurse.  [place] nurse will not follow at this time.  Please re-consult the [facility] team if needed.  Val Riles, RN, MSN, CWOCN, CNS-BC, pager [phone]

## 2018-10-18 NOTE — Care Management Important Message (Signed)
Important Message  Patient Details  Name: Julia Gordon MRN: [id number] Date of Birth: [birth year]   Medicare Important Message Given:  Yes     Dannette Barbara 10/18/2018, 1:40 PM

## 2018-10-19 DIAGNOSIS — I70245 Atherosclerosis of native arteries of left leg with ulceration of other part of foot: Secondary | ICD-10-CM

## 2018-10-19 LAB — GLUCOSE, CAPILLARY: Glucose-Capillary: 102 mg/dL — ABNORMAL HIGH (ref 70–99)

## 2018-10-19 MED ORDER — ALBUTEROL SULFATE (2.5 MG/3ML) 0.083% IN NEBU: 2.5000 mg | INHALATION_SOLUTION | RESPIRATORY_TRACT | Status: DC | PRN

## 2018-10-19 NOTE — NC FL2 (Signed)
[place] LEVEL OF CARE SCREENING TOOL     IDENTIFICATION  Patient Name: Julia Gordon Birthdate: 02/17/1934 Sex: female Admission Date (Current Location): 10/13/2018  [place] and [place] Number:  Engineering geologist and Address:  [facility], [address] [place], [place] [address]      Provider Number: [id number]  Attending Physician Name and Address:  Epifanio Lesches, MD  Relative Name and Phone Number:  Maximiano Coss Daughter [phone]    Current Level of Care: Hospital Recommended Level of Care: [place] Prior Approval Number:    Date Approved/Denied:   PASRR Number: [id number]  Discharge Plan: SNF    Current Diagnoses: Patient Active Problem List   Diagnosis Date Noted  . Acute exacerbation of CHF (congestive heart failure) ([place])   . Acute on chronic congestive heart failure ([place])   . Palliative care encounter   . Acute on chronic systolic heart failure ([place]) 10/13/2018  . Pressure injury of skin 10/02/2018  . Somnolence 10/01/2018  . Stroke ([place]) 10/01/2018  . Persistent atrial fibrillation ([place]) 09/23/2018  . Skin ulcer, limited to breakdown of skin ([place]) 09/23/2018  . Left arm swelling 09/23/2018  . Acute deep vein thrombosis (DVT) of left lower extremity ([place]) 06/27/2018  . Age-related osteoporosis with current pathological fracture with routine healing 07/24/2017  . Breast cancer ([place]) 07/24/2017  . Femur fracture ([place]) 07/20/2017  . HTN (hypertension) 07/20/2017  . PAF (paroxysmal atrial fibrillation) ([place]) 07/20/2017  . Primary cancer of upper inner quadrant of left female breast ([place]) 07/17/2014  . Acute on chronic HFrEF (heart failure with reduced ejection fraction) ([place]) 03/24/2011  . ICD (implantable cardiac defibrillator) in place 03/24/2011  . Dyslipidemia 03/24/2011  . Other primary cardiomyopathies     Orientation RESPIRATION BLADDER Height & Weight     Self,  Place  Normal Incontinent Weight: (low bed. lift broke. nurse notified) Height:  5\' 2"  (157.5 cm)  BEHAVIORAL SYMPTOMS/MOOD NEUROLOGICAL BOWEL NUTRITION STATUS      Continent Diet(2G sodium diet)  AMBULATORY STATUS COMMUNICATION OF NEEDS Skin   Limited Assist Verbally PU Stage and Appropriate Care   PU Stage 2 Dressing: (PRN dressing change)                   Personal Care Assistance Level of Assistance  Bathing, Feeding, Dressing Bathing Assistance: Limited assistance Feeding assistance: Limited assistance Dressing Assistance: Limited assistance     Functional Limitations Info  Sight, Speech, Hearing Sight Info: Adequate Hearing Info: Adequate Speech Info: Adequate    SPECIAL CARE FACTORS FREQUENCY  PT (By licensed PT), OT (By licensed OT)     PT Frequency: Minimum 5x a week OT Frequency: Minimum 5x a week            Contractures Contractures Info: Not present    Additional Factors Info  Code Status, Allergies Code Status Info: Partial Code Allergies Info: NKA           Current Medications (10/19/2018):  This is the current hospital active medication list Current Facility-Administered Medications  Medication Dose Route Frequency Provider Last Rate Last Dose  . acetaminophen (TYLENOL) tablet 650 mg  650 mg Oral Q6H PRN Epifanio Lesches, MD       Or  . acetaminophen (TYLENOL) suppository 650 mg  650 mg Rectal Q6H PRN Epifanio Lesches, MD      . albuterol (PROVENTIL) (2.5 MG/3ML) 0.083% nebulizer solution 2.5 mg  2.5 mg Nebulization Q2H PRN Epifanio Lesches,  MD      . aspirin EC tablet 81 mg  81 mg Oral Daily Epifanio Lesches, MD   81 mg at 10/19/18 0825  . atorvastatin (LIPITOR) tablet 40 mg  40 mg Oral q1800 Epifanio Lesches, MD   40 mg at 10/19/18 1638  . bisacodyl (DULCOLAX) EC tablet 5 mg  5 mg Oral Daily PRN Epifanio Lesches, MD      . Chlorhexidine Gluconate Cloth 2 % PADS 6 each  6 each Topical Daily Pyreddy, Reatha Harps, MD   6 each  at 10/19/18 0826  . furosemide (LASIX) tablet 20 mg  20 mg Oral BID Epifanio Lesches, MD   20 mg at 10/19/18 1638  . haloperidol lactate (HALDOL) injection 1 mg  1 mg Intravenous Q6H PRN Dustin Flock, MD   1 mg at 10/17/18 2137  . ipratropium-albuterol (DUONEB) 0.5-2.5 (3) MG/3ML nebulizer solution 3 mL  3 mL Nebulization TID Saundra Shelling, MD   3 mL at 10/19/18 1340  . metoprolol succinate (TOPROL-XL) 24 hr tablet 50 mg  50 mg Oral Daily Epifanio Lesches, MD   50 mg at 10/19/18 0825  . ondansetron (ZOFRAN) tablet 4 mg  4 mg Oral Q6H PRN Epifanio Lesches, MD       Or  . ondansetron (ZOFRAN) injection 4 mg  4 mg Intravenous Q6H PRN Epifanio Lesches, MD      . Rivaroxaban (XARELTO) tablet 15 mg  15 mg Oral Q supper Epifanio Lesches, MD   15 mg at 10/19/18 1638  . sacubitril-valsartan (ENTRESTO) 24-26 mg per tablet  1 tablet Oral BID Epifanio Lesches, MD   1 tablet at 10/19/18 0825  . silver sulfADIAZINE (SILVADENE) 1 % cream   Topical BID Epifanio Lesches, MD      . sodium chloride flush (NS) 0.9 % injection 10-40 mL  10-40 mL Intracatheter Q12H Pyreddy, Reatha Harps, MD   10 mL at 10/19/18 0826  . sodium chloride flush (NS) 0.9 % injection 10-40 mL  10-40 mL Intracatheter PRN Saundra Shelling, MD   10 mL at 10/16/18 1717  . traMADol (ULTRAM) tablet 50 mg  50 mg Oral Q6H PRN Epifanio Lesches, MD   50 mg at 10/18/18 1706  . traZODone (DESYREL) tablet 25 mg  25 mg Oral QHS PRN Epifanio Lesches, MD         Discharge Medications: Please see discharge summary for a list of discharge medications.  Relevant Imaging Results:  Relevant Lab Results:   Additional Information SSN [id number]  Ross Ludwig, LCSW

## 2018-10-19 NOTE — Progress Notes (Signed)
[place] at [place] NAME: Marrianna Lide    MR#:  [id number]  DATE OF BIRTH:  [birth year]  SUBJECTIVE: Appears confused  CHIEF COMPLAINT:   Chief Complaint  Patient presents with  . Shortness of Breath  Very confused.  REVIEW OF SYSTEMS:    Review of Systems  Unable to perform ROS: Dementia     Allergies  Allergen Reactions  . No Known Allergies     VITALS:  Blood pressure (!) 106/93, pulse 91, temperature (!) 97.5 F (36.4 C), temperature source Oral, resp. rate 18, height 5\' 2"  (1.575 m), weight 76.2 kg, SpO2 94 %.  PHYSICAL EXAMINATION:   Physical Exam  GENERAL:  84 y.o.-year-old patient lying in the bed with no acute distress.  EYES: Pupils equal, round, reactive to light and accommodation. No scleral icterus. Extraocular muscles intact.  HEENT: Head atraumatic, normocephalic. Oropharynx and nasopharynx clear.  NECK:  Supple, no jugular venous distention. No thyroid enlargement, no tenderness.  LUNGS: Decreased breath sounds bilaterally, bibasilar creps heard. No use of accessory muscles of respiration.  CARDIOVASCULAR: S1, S2 normal. No murmurs, rubs, or gallops.  ABDOMEN: Soft, nontender, nondistended. Bowel sounds present. No organomegaly or mass.  EXTREMITIES: No cyanosis, clubbing  has edema b/l.    NEUROLOGIC: Cranial nerves II through XII are intact. No focal Motor or sensory deficits b/l.   PSYCHIATRIC: The patient is alert and oriented x 2.  SKIN: Has lower extremity wounds       LABORATORY PANEL:   CBC Recent Labs  Lab 10/17/18 0515  WBC 7.1  HGB 11.9*  HCT 36.9  PLT 204   ------------------------------------------------------------------------------------------------------------------ Chemistries  Recent Labs  Lab 10/13/18 1816  10/17/18 0520  NA 139   < > 141  K 4.0   < > 3.2*  CL 103   < > 103  CO2 22   < > 27  GLUCOSE 106*   < > 117*  BUN 37*   < > 35*  CREATININE 1.33*   < > 1.27*   CALCIUM 8.8*   < > 8.2*  AST 29  --   --   ALT 11  --   --   ALKPHOS 60  --   --   BILITOT 1.6*  --   --    < > = values in this interval not displayed.   ------------------------------------------------------------------------------------------------------------------  Cardiac Enzymes No results for input(s): TROPONINI in the last 168 hours. ------------------------------------------------------------------------------------------------------------------  RADIOLOGY:  No results found.   ASSESSMENT AND PLAN:   84 year old elderly female patient with history of breast cancer, colon cancer, history of DVT on oxygen at nursing home, COPD, congestive heart failure with reduced ejection fraction of EF of 25 to 30%, hypertension, hyperlipidemia currently under hospitalist service  -Acute on chronic systolic heart failure exacerbation, EF 20 to 25%.  clinically improving.  Continue oral Lasix. -History of right leg DVT Continue anticoagulation with Xarelto -Peripheral vascular disease but bilateral lower extremity ulcers  recently had left leg angiogram, vascular surgery is seen the patient, scheduled for repeat angiogram of left leg Thursday.   -Chronic atrial fibrillation Continue rate control with beta-blocker Continue Xarelto for anticoagulation  -Acute on chronic kidney disease stage III Monitor renal function as patient is on diuretics  -Palliative care service on board; limited code.  -Long-term prognosis poor  -History of breast and colon cancer Supportive care Chronic respiratory failure, patient on 3 L of oxygen all the time.  Continue with  oxygen. Disposition; back to nursing home , left leg angiogram on Thursday to see if we can have angioplasty. All the records are reviewed and case discussed with Care Management/Social Worker. Management plans discussed with the patient, family and they are in agreement.  CODE STATUS: Partial code  DVT Prophylaxis: SCDs   TOTAL TIME TAKING CARE OF THIS PATIENT: 36 minutes.   POSSIBLE D/C IN 2 to 3 DAYS, DEPENDING ON CLINICAL CONDITION.  Epifanio Lesches M.D on 10/19/2018 at 2:04 PM  Between 7am to 6pm - Pager - [phone]  After 6pm go to [email or web address] - password [email or web address] [place] [facility]  Office  [phone]  CC: Primary care physician; Ellamae Sia, MD  Note: This dictation was prepared with Dragon dictation along with smaller phrase technology. Any transcriptional errors that result from this process are unintentional.

## 2018-10-19 NOTE — Progress Notes (Signed)
   10/19/18 1500  Clinical Encounter Type  Visited With Patient and family together  Visit Type Initial  Referral From Nurse  Consult/Referral To [place] received page that patient would like to see a Chaplain. Chaplain visit patient and daughter was at bedside. Daughter wanted to know about AD, the patient wanted to make daughter POH. Chaplain left documents with daughter to complete. A Chaplain will follow up tomorrow.

## 2018-10-19 NOTE — Progress Notes (Addendum)
Physical Therapy Treatment Patient Details Name: Julia Gordon MRN: [id number] DOB: [birth year] Today's Date: 10/19/2018    History of Present Illness Julia Gordon  is a 84 y.o. female presented to hospital ED 10/13/2018 through EMS from [facility]) because of shortness of breath, edema of extremities.  Patient admitted to hospital for acute on chronic systolic heart failure, R leg DVT, history of chronic A-fib, acute on chronic renal failure with CKD stage II, PVD with bilateral LE ulcers. Patient was seen by vascular surgery during the last admission with angioplasty of the L leg.  Patient usually on 2 L of oxygen all the time but at nursing home because of shortness of breath patient needed a higher amount of oxygen up to 4 L. Relevant PMH includes L frontal lobe stroke, PVD, EF less than 20%, pacemaker, breast cancer, COPD, R femur fracture (2019). Chest radiograph shows low volume film basilar atelectasis or scarring.    PT Comments    Pt asleep upon entry, but awakens easily, tele-sitter in room. Pt agreeable to participate. Pt requiring only modA for bed mobility this date, but still unable to scoot toward HOB without totalA. Pt motivated to get OOB to recliner, but despite several attempts and maxA is never fully able to rise to standing, even from elevated surface. Pt does not understand why attempting to go to chair is unwise whilst unable to rise to standing. Pt electing NWB of LLE d/t pain (pt has a LLE wound). Pt returned to bed, sitting tall in preparation for lunch tray. Pt is hungry, but disoriented to time of day (attempted orientation multiple times in session without success). Pt given extensive cues for hand placement in transfer, but no noted improvement in performance is noted- self-selected hand placement more problematic for safety concerns. Pt progressing well in general, but remains total assist for most basic mobility.     Follow Up Recommendations  SNF;Supervision/Assistance - 24 hour     Equipment Recommendations  Rolling walker with 5" wheels;3in1 (PT);Wheelchair (measurements PT);Wheelchair cushion (measurements PT)    Recommendations for Other Services OT consult     Precautions / Restrictions Precautions Precautions: Fall Precaution Comments: R chest port; B LE wounds Restrictions Weight Bearing Restrictions: No    Mobility  Bed Mobility Overal bed mobility: Needs Assistance   Rolling: Max assist(scooting to [facility])            Transfers Overall transfer level: Needs assistance Equipment used: None;Rolling walker (2 wheeled)   Sit to Stand: Total assist;+2 physical assistance(attempted 4 times, and with elevated surface eventually; pt unable to full come to upright standing.)         General transfer comment: Pt electing to use NWB on LLE d/t pain.  Ambulation/Gait                 Stairs             Wheelchair Mobility    Modified Rankin (Stroke Patients Only)       Balance                                            Cognition Arousal/Alertness: Awake/alert Behavior During Therapy: WFL for tasks assessed/performed Overall Cognitive Status: No family/caregiver present to determine baseline cognitive functioning  Exercises General Exercises - Lower Extremity Long Arc Quad: AROM;Both;10 reps;Seated    General Comments        Pertinent Vitals/Pain Pain Assessment: Faces Faces Pain Scale: Hurts a little bit(quite apprehensive about author coming near or touching LLE) Pain Location: when touching L LE (lower leg and ankle) Pain Descriptors / Indicators: Grimacing;Guarding Pain Intervention(s): Limited activity within patient's tolerance;Monitored during session    Home Living                      Prior Function            PT Goals (current goals can now be found in the care plan section)  Acute Rehab PT Goals Patient Stated Goal: To go home PT Goal Formulation: With patient Time For Goal Achievement: 10/29/18 Potential to Achieve Goals: Fair Progress towards PT goals: Progressing toward goals    Frequency    Min 2X/week      PT Plan Current plan remains appropriate    Co-evaluation              AM-PAC PT "6 Clicks" Mobility   Outcome Measure  Help needed turning from your back to your side while in a flat bed without using bedrails?: A Lot Help needed moving from lying on your back to sitting on the side of a flat bed without using bedrails?: A Lot Help needed moving to and from a bed to a chair (including a wheelchair)?: Total Help needed standing up from a chair using your arms (e.g., wheelchair or bedside chair)?: Total Help needed to walk in hospital room?: Total Help needed climbing 3-5 steps with a railing? : Total 6 Click Score: 8    End of Session Equipment Utilized During Treatment: Gait belt Activity Tolerance: Patient tolerated treatment well;Patient limited by pain;Patient limited by fatigue Patient left: in bed;with call bell/phone within reach;with bed alarm set Nurse Communication: Mobility status PT Visit Diagnosis: Muscle weakness (generalized) (M62.81);Difficulty in walking, not elsewhere classified (R26.2);Pain Pain - Right/Left: Left Pain - part of body: Leg     Time: 1147-1208 PT Time Calculation (min) (ACUTE ONLY): 21 min  Charges:  $Therapeutic Activity: 8-22 mins                    1:00 PM, 10/19/18 Etta Grandchild, PT, DPT Physical Therapist - [facility]  [phone] ([place])     Buccola,Allan C 10/19/2018, 1:00 PM

## 2018-10-19 NOTE — Consult Note (Signed)
[facility] Vascular Consult Note  MRN : [id number]  Julia Gordon is a 84 y.o. ([birth year]) female who presents with chief complaint of  Chief Complaint  Patient presents with  . Shortness of Breath   History of Present Illness:  The patient is an 84 year old female with multiple medical issues (see below) who presented to the [facility] from [place] with a chief complaint of shortness of breath.  Patient admitted for a CHF exacerbation.  Patient with a known history of peripheral artery disease.  Most recent intervention was on October 07, 2018: 1. Ultrasound guidance for vascular access right femoral artery 2. Catheter placement into left SFA from right femoral approach 3. Aortogram and selective left lower extremity angiogram 4. Percutaneous transluminal angioplasty of left popliteal artery with 5 mm diameter by 8 cm length Lutonix drug-coated angioplasty balloon 5.  Percutaneous transluminal angioplasty of the left posterior tibial artery with a 2.5 mm diameter by 10 cm length angioplasty balloon             6. StarClose closure device right femoral artery  Minimal improvement to left lower extremity wounds.  Wounds also noted to the right lower extremity but not as bad as left.  Left lower extremity is painful especially to palpation.  Warm distally to about the ankle and transitioning much color to the foot.  Unable to palpate pedal pulses.  Denies fever, nausea vomiting.  Shortness of breath has improved no chest pain.  Vascular surgery was consulted by Dr. Vianne Bulls Current Facility-Administered Medications  Medication Dose Route Frequency Provider Last Rate Last Dose  . acetaminophen (TYLENOL) tablet 650 mg  650 mg Oral Q6H PRN Epifanio Lesches, MD       Or  . acetaminophen (TYLENOL) suppository 650 mg  650 mg Rectal Q6H PRN Epifanio Lesches, MD      .  albuterol (PROVENTIL) (2.5 MG/3ML) 0.083% nebulizer solution 2.5 mg  2.5 mg Nebulization Q2H PRN Epifanio Lesches, MD      . aspirin EC tablet 81 mg  81 mg Oral Daily Epifanio Lesches, MD   81 mg at 10/19/18 0825  . atorvastatin (LIPITOR) tablet 40 mg  40 mg Oral q1800 Epifanio Lesches, MD   40 mg at 10/18/18 1706  . bisacodyl (DULCOLAX) EC tablet 5 mg  5 mg Oral Daily PRN Epifanio Lesches, MD      . Chlorhexidine Gluconate Cloth 2 % PADS 6 each  6 each Topical Daily Pyreddy, Reatha Harps, MD   6 each at 10/19/18 0826  . furosemide (LASIX) tablet 20 mg  20 mg Oral BID Epifanio Lesches, MD   20 mg at 10/19/18 0825  . haloperidol lactate (HALDOL) injection 1 mg  1 mg Intravenous Q6H PRN Dustin Flock, MD   1 mg at 10/17/18 2137  . ipratropium-albuterol (DUONEB) 0.5-2.5 (3) MG/3ML nebulizer solution 3 mL  3 mL Nebulization TID Saundra Shelling, MD   3 mL at 10/19/18 0817  . metoprolol succinate (TOPROL-XL) 24 hr tablet 50 mg  50 mg Oral Daily Epifanio Lesches, MD   50 mg at 10/19/18 0825  . ondansetron (ZOFRAN) tablet 4 mg  4 mg Oral Q6H PRN Epifanio Lesches, MD       Or  . ondansetron (ZOFRAN) injection 4 mg  4 mg Intravenous Q6H PRN Epifanio Lesches, MD      . Rivaroxaban (XARELTO) tablet 15 mg  15 mg Oral Q supper Epifanio Lesches, MD   15 mg at 10/18/18 1706  .  sacubitril-valsartan (ENTRESTO) 24-26 mg per tablet  1 tablet Oral BID Epifanio Lesches, MD   1 tablet at 10/19/18 0825  . silver sulfADIAZINE (SILVADENE) 1 % cream   Topical BID Epifanio Lesches, MD      . sodium chloride flush (NS) 0.9 % injection 10-40 mL  10-40 mL Intracatheter Q12H Pyreddy, Reatha Harps, MD   10 mL at 10/19/18 0826  . sodium chloride flush (NS) 0.9 % injection 10-40 mL  10-40 mL Intracatheter PRN Saundra Shelling, MD   10 mL at 10/16/18 1717  . traMADol (ULTRAM) tablet 50 mg  50 mg Oral Q6H PRN Epifanio Lesches, MD   50 mg at 10/18/18 1706  . traZODone (DESYREL) tablet 25 mg  25 mg Oral  QHS PRN Epifanio Lesches, MD       Past Medical History:  Diagnosis Date  . Anemia   . Breast cancer ([place]) 2015   a. L mastectomy with chemoradiation  . 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 COPD (chronic obstructive pulmonary disease) ([place])   . Depression   . DJD (degenerative joint disease) of knee    right knee  . DVT (deep venous thrombosis) ([place])    a. 06/2018 L popliteal and R peroneal DVTs-->Xarelto.  . H/O hysterectomy with oophorectomy    for DUB  . HFrEF (heart failure with reduced ejection fraction) ([place])    a. 06/2018 Echo: EF 25-30%.  . History of depression   . HLD (hyperlipidemia)   . HTN (hypertension)   . LV (left ventricular) mural thrombus    history of, resolved (echo 04/09)  . NICM (nonischemic cardiomyopathy) ([place])    a. Reports prior h/o cath [facility]; b. status post [place] with [employer] lead in 2009 at [facility], Dr. Boyd Kerbs; c. 2014 - prev seen by G. Lovena Le, MD; c. 06/2018 Echo: EF 25-30%, DD. Diff HK. RVSP 68mmHg. Mildly dil RA. Mild to mod MR. Mod TR. Mild AI.  Marland Kitchen Persistent atrial fibrillation ([place])    a. noted in [place] from 2008-2009; b. CHADS2VASc 6 (CHF, HTN, age x 2, vascular disease, female)-->eliquis added 06/2018 in setting of LLE DVT.  . Valvular heart disease    a. 06/2018 Echo: Mod TR, mild to mod MR, mild AI.   Past Surgical History:  Procedure Laterality Date  . ABDOMINAL HYSTERECTOMY    . BREAST BIOPSY Left 05/10/13   [place] bx-positive  . COLONOSCOPY WITH PROPOFOL N/A 01/15/2017   Procedure: COLONOSCOPY WITH PROPOFOL;  Surgeon: Jonathon Bellows, MD;  Location: [facility] ENDOSCOPY;  Service: Gastroenterology;  Laterality: N/A;  . LAPAROSCOPIC RIGHT COLON RESECTION  08/10   Dr Pat Patrick, for adenoca   . LOWER EXTREMITY ANGIOGRAPHY Left 10/07/2018   Procedure: Lower Extremity Angiography;  Surgeon: Algernon Huxley, MD;  Location: [facility] CV LAB;  Service: Cardiovascular;  Laterality: Left;  Marland Kitchen MASTECTOMY Left 2015   BREAST CA  . OOPHORECTOMY    . ORIF  FEMUR FRACTURE Right 07/21/2017   Procedure: OPEN REDUCTION INTERNAL FIXATION (ORIF) DISTAL FEMUR FRACTURE;  Surgeon: Lovell Sheehan, MD;  Location: [facility] ORS;  Service: Orthopedics;  Laterality: Right;  femur   . PACEMAKER INSERTION  2009  . REPLACEMENT TOTAL KNEE  1990's   right    Social History Social History   Tobacco Use  . Smoking status: Former Research scientist (life sciences)  . Smokeless tobacco: Never Used  . Tobacco comment: quit 2006  Substance Use Topics  . Alcohol use: No    Comment: former  . Drug use: No  Family History Family History  Problem Relation Age of Onset  . Hypertension Father   . Alzheimer's disease Mother   . Breast cancer Sister 8  . Diabetes Other        brothers  . Stroke Brother   . Heart failure Brother   . Lupus Sister   Denies family history of peripheral artery disease, venous disease or renal disease.  Allergies  Allergen Reactions  . No Known Allergies    REVIEW OF SYSTEMS (Negative unless checked)  Constitutional: [] Weight loss  [] Fever  [] Chills Cardiac: [] Chest pain   [] Chest pressure   [] Palpitations   [] Shortness of breath when laying flat   [] Shortness of breath at rest   [] Shortness of breath with exertion. Vascular:  [x] Pain in legs with walking   [x] Pain in legs at rest   [x] Pain in legs when laying flat   [] Claudication   [x] Pain in feet when walking  [x] Pain in feet at rest  [x] Pain in feet when laying flat   [] History of DVT   [] Phlebitis   [] Swelling in legs   [] Varicose veins   [x] Non-healing ulcers Pulmonary:   [] Uses home oxygen   [] Productive cough   [] Hemoptysis   [] Wheeze  [] COPD   [] Asthma Neurologic:  [] Dizziness  [] Blackouts   [] Seizures   [] History of stroke   [] History of TIA  [] Aphasia   [] Temporary blindness   [] Dysphagia   [] Weakness or numbness in arms   [] Weakness or numbness in legs Musculoskeletal:  [] Arthritis   [] Joint swelling   [] Joint pain   [] Low back pain Hematologic:  [] Easy bruising  [] Easy bleeding   [] Hypercoagulable  state   [] Anemic  [] Hepatitis Gastrointestinal:  [] Blood in stool   [] Vomiting blood  [] Gastroesophageal reflux/heartburn   [] Difficulty swallowing. Genitourinary:  [] Chronic kidney disease   [] Difficult urination  [] Frequent urination  [] Burning with urination   [] Blood in urine Skin:  [] Rashes   [x] Ulcers   [x] Wounds Psychological:  [] History of anxiety   []  History of major depression.  Physical Examination  Vitals:   10/18/18 2351 10/19/18 0434 10/19/18 0817 10/19/18 0839  BP: 103/65 (!) 114/96  (!) 106/93  Pulse: 84 83 73 94  Resp: 20  18   Temp:  (!) 97.5 F (36.4 C)  (!) 97.5 F (36.4 C)  TempSrc:  Oral  Oral  SpO2: 100%  99% 91%  Weight:      Height:       Body mass index is 30.73 kg/m. Gen:  WD/WN, NAD Head: [place]/AT, No temporalis wasting. Prominent temp pulse not noted. Ear/Nose/Throat: Hearing grossly intact, nares w/o erythema or drainage, oropharynx w/o Erythema/Exudate Eyes: Sclera non-icteric, conjunctiva clear Neck: Trachea midline.  No JVD.  Pulmonary:  Good air movement, respirations not labored, equal bilaterally.  Cardiac: RRR, normal S1, S2. Vascular:  Vessel Right Left  Radial Palpable Palpable  Ulnar Palpable Palpable  Brachial Palpable Palpable  Carotid Palpable, without bruit Palpable, without bruit  Aorta Not palpable N/A  Femoral Palpable Palpable  Popliteal Palpable Palpable  PT Non-Palpable Non-Palpable  DP Non-Palpable Non-Palpable   Left Lower Extremity: Thigh soft.  Calf soft.  Extremity is tender to palpation.  Unable to palpate pedal pulses.  Extremities warm distally to the ankle then foot becomes cooler.  Ankle with necrotic eschar.  Lateral aspect of legs with 2 large wounds.  Gastrointestinal: soft, non-tender/non-distended. No guarding/reflex.  Musculoskeletal: M/S 5/5 throughout.  Extremities without ischemic changes.  No deformity or atrophy. No edema. Neurologic: Sensation grossly  intact in extremities.  Symmetrical.  Speech is  fluent. Motor exam as listed above. Psychiatric: Judgment intact, Mood & affect appropriate for pt's clinical situation. Dermatologic: As above Lymph : No Cervical, Axillary, or Inguinal lymphadenopathy.  CBC Lab Results  Component Value Date   WBC 7.1 10/17/2018   HGB 11.9 (L) 10/17/2018   HCT 36.9 10/17/2018   MCV 94.6 10/17/2018   PLT 204 10/17/2018   BMET    Component Value Date/Time   NA 141 10/17/2018 0520   NA 143 09/02/2018 1229   NA 136 12/13/2013 0623   K 3.2 (L) 10/17/2018 0520   K 3.8 12/13/2013 0623   CL 103 10/17/2018 0520   CL 104 12/13/2013 0623   CO2 27 10/17/2018 0520   CO2 28 12/13/2013 0623   GLUCOSE 117 (H) 10/17/2018 0520   GLUCOSE 100 (H) 12/13/2013 0623   BUN 35 (H) 10/17/2018 0520   BUN 38 (H) 09/02/2018 1229   BUN 16 12/13/2013 0623   CREATININE 1.27 (H) 10/17/2018 0520   CREATININE 1.01 12/13/2013 0623   CALCIUM 8.2 (L) 10/17/2018 0520   CALCIUM 9.7 12/13/2013 0623   GFRNONAA 39 (L) 10/17/2018 0520   GFRNONAA 56 (L) 12/13/2013 0623   GFRNONAA 58 (L) 09/27/2013 1332   GFRAA 46 (L) 10/17/2018 0520   GFRAA >60 12/13/2013 0623   GFRAA >60 09/27/2013 1332   Estimated Creatinine Clearance: 32.6 mL/min (A) (by C-G formula based on SCr of 1.27 mg/dL (H)).  COAG Lab Results  Component Value Date   INR 1.0 06/27/2018   Radiology Ct Head Wo Contrast  Result Date: 10/01/2018 CLINICAL DATA:  Altered mental status.  Patient on Xarelto. EXAM: CT HEAD WITHOUT CONTRAST TECHNIQUE: Contiguous axial images were obtained from the base of the skull through the vertex without intravenous contrast. COMPARISON:  October 04, 2006 FINDINGS: Brain: No subdural, epidural, or subarachnoid hemorrhage identified. Ventricles and sulci are prominent but stable. Cerebellum, brainstem, and basal cisterns are within normal limits. No mass effect or midline shift. White matter changes are identified, particularly in the left frontal lobe such as on axial image 18. There  appears to be mild involvement over the overlying cortex suggesting a small left frontal infarct. No other evidence of acute ischemia or infarct identified. Vascular: Calcified atherosclerosis is seen in the intracranial carotids. Skull: Normal. Negative for fracture or focal lesion. Sinuses/Orbits: No acute finding. Other: None. IMPRESSION: 1. Focal white matter changes are seen in the left frontal lobe with a small region of overlying cortical involvement consistent with infarct. These findings are age indeterminate but favored to be nonacute based on appearance. Recommend clinical correlation 2. No other acute abnormalities are identified. Electronically Signed   By: Dorise Bullion III M.D   On: 10/01/2018 16:42   US Carotid Bilateral (at [facility] Only)  Result Date: 10/02/2018 CLINICAL DATA:  84 year old female with a history of stroke EXAM: BILATERAL CAROTID DUPLEX ULTRASOUND TECHNIQUE: Pearline Cables scale imaging, color Doppler and duplex ultrasound were performed of bilateral carotid and vertebral arteries in the neck. COMPARISON:  None. FINDINGS: Criteria: Quantification of carotid stenosis is based on velocity parameters that correlate the residual internal carotid diameter with NASCET-based stenosis levels, using the diameter of the distal internal carotid lumen as the denominator for stenosis measurement. The following velocity measurements were obtained: RIGHT ICA:  Systolic 41 cm/sec, Diastolic 19 cm/sec CCA:  31 cm/sec SYSTOLIC ICA/CCA RATIO:  1.3 ECA:  32 cm/sec LEFT ICA:  Systolic 66 cm/sec, Diastolic 13 cm/sec  CCA:  63 cm/sec SYSTOLIC ICA/CCA RATIO:  1.0 ECA:  59 cm/sec Right Brachial SBP: Not acquired Left Brachial SBP: Not acquired RIGHT CAROTID ARTERY: Calcification of the right common carotid artery. Intermediate waveform maintained. Heterogeneous and partially calcified plaque at the right carotid bifurcation. No significant lumen shadowing. Low resistance waveform of the right ICA. No  significant tortuosity. RIGHT VERTEBRAL ARTERY: Antegrade flow with low resistance waveform. LEFT CAROTID ARTERY: Calcifications of the left common carotid artery. Intermediate waveform maintained. Heterogeneous and partially calcified plaque at the left carotid bifurcation without significant lumen shadowing. Low resistance waveform of the left ICA. No significant tortuosity. LEFT VERTEBRAL ARTERY:  Antegrade flow with low resistance waveform. IMPRESSION: Color duplex indicates moderate heterogeneous and calcified plaque, with no hemodynamically significant stenosis by duplex criteria in the extracranial cerebrovascular circulation. Signed, Dulcy Fanny. Dellia Nims, RPVI [facility] Radiology Electronically Signed   By: Corrie Mckusick D.O.   On: 10/02/2018 11:51   US Arterial Lower Extremity Duplex Bilateral  Result Date: 09/29/2018 CLINICAL DATA:  84 year old female with bilateral lower extremity ulcers for 1 month. Evaluate for underlying peripheral arterial disease. EXAM: BILATERAL LOWER EXTREMITY ARTERIAL DUPLEX SCAN TECHNIQUE: Gray-scale sonography as well as color Doppler and duplex ultrasound was performed to evaluate the arteries of both lower extremities including the common, superficial and profunda femoral arteries, popliteal artery and calf arteries. COMPARISON:  None. FINDINGS: Right Lower Extremity Inflow: Normal common femoral arterial waveforms and velocities. No evidence of inflow (aortoiliac) disease. Outflow: Normal profunda femoral, superficial femoral and popliteal arterial waveforms and velocities. No focal elevation of the PSV to suggest stenosis. Runoff: Normal posterior and anterior tibial arterial waveforms and velocities. Vessels are patent to the ankle. Left Lower Extremity Inflow: Normal common femoral arterial waveforms and velocities. No evidence of inflow (aortoiliac) disease. Outflow: Normal profunda femoral, superficial femoral and  popliteal arterial waveforms and velocities. No focal elevation of the PSV to suggest stenosis. Runoff: Normal posterior and anterior tibial arterial waveforms and velocities. Vessels are patent to the ankle. IMPRESSION: Minimal heterogeneous atherosclerotic plaque bilaterally without evidence of hemodynamically significant stenosis or occlusion. Signed, Criselda Peaches, MD, [place] [facility] Radiology Electronically Signed   By: Jacqulynn Cadet M.D.   On: 09/29/2018 10:04   Dg Chest Port 1 View  Result Date: 10/16/2018 CLINICAL DATA:  Acute exacerbation of congestive heart failure. EXAM: PORTABLE CHEST 1 VIEW COMPARISON:  October 13, 2018. FINDINGS: Stable cardiomegaly. Stable position of left-sided pacemaker. Right internal jugular Port-A-Cath is unchanged in position. No pneumothorax or pleural effusion is noted. Minimal bibasilar subsegmental atelectasis. Bony thorax is unremarkable. IMPRESSION: Minimal bibasilar subsegmental atelectasis. Electronically Signed   By: Marijo Conception M.D.   On: 10/16/2018 09:35   Dg Chest Port 1 View  Result Date: 10/13/2018 CLINICAL DATA:  Difficulty breathing. EXAM: PORTABLE CHEST 1 VIEW COMPARISON:  10/01/2018 FINDINGS: At 1613 hours. The cardio pericardial silhouette is enlarged. The lungs are clear without focal pneumonia, edema, pneumothorax or pleural effusion. Streaky opacity at the right base again noted, likely atelectasis or scarring battery pack for left pacer/AICD obscures the left base. The visualized bony structures of the thorax are intact. Right Port-A-Cath again noted. Telemetry leads overlie the chest. IMPRESSION: Low volume film basilar atelectasis or scarring. Electronically Signed   By: Misty Stanley M.D.   On: 10/13/2018 16:35   Dg Chest Port 1 View  Result Date: 10/01/2018 CLINICAL DATA:  Sepsis. EXAM: PORTABLE CHEST 1 VIEW  COMPARISON:  09/25/2018 FINDINGS: The heart is enlarged but stable.  There is moderate tortuosity, ectasia and calcification of the thoracic aorta. The pacer wires/AICD is stable. The right IJ power port is stable. The lungs are clear of an acute process. Minimal right basilar scarring or atelectasis. The bony thorax is intact. IMPRESSION: 1. Stable cardiac enlargement and tortuous ectatic calcified thoracic aorta. 2. Streaky right basilar scarring or atelectasis but no infiltrates or effusions. Electronically Signed   By: Marijo Sanes M.D.   On: 10/01/2018 16:36   Dg Chest Portable 1 View  Result Date: 09/25/2018 CLINICAL DATA:  Shortness of breath EXAM: PORTABLE CHEST 1 VIEW COMPARISON:  06/27/2018 FINDINGS: Right-sided chest port and left implanted cardiac device remain in place. Stable mild cardiomegaly. Calcified thoracic aorta. No focal airspace consolidation. No pleural effusion or pneumothorax. IMPRESSION: Stable mild cardiomegaly without acute cardiopulmonary process. Electronically Signed   By: Davina Poke M.D.   On: 09/25/2018 16:15   Assessment/Plan The patient is an 84 year old female with multiple medical issues including known peripheral artery disease last endovascular intervention on October 07, 2018 who presented to the [facility] from [place] with a chief complaint of shortness of breath.  Patient admitted for a CHF exacerbation. 1.  PAD: Patient with wounds to the bilateral lower extremity.  Left lower extremity worse when compared to right.  Recent intervention on October 07, 2018.  Wounds have shown little improvement.  Would recommend repeating a left lower extremity angiogram with possible intervention and attempt to assess if prior intervention is still patent /revascularize the extremity in preparation for possible amputation.  Patient will also need a right lower extremity angiogram however we can plan for this in the near future.  We will plan on this on Thursday with Dr. Lucky Cowboy 2. DVT: Right peroneal vein DVT  diagnosed on 06/28/18 -currently is asymptomatic.  On Xarelto. 3.  Hyperlipidemia: On aspirin and statin for medical management. Encouraged good control as its slows the progression of atherosclerotic disease.  Discussed with Dr. Mayme Genta, PA-C  10/19/2018 12:32 PM  This note was created with Dragon medical transcription system.  Any error is purely unintentional

## 2018-10-20 ENCOUNTER — Other Ambulatory Visit (INDEPENDENT_AMBULATORY_CARE_PROVIDER_SITE_OTHER): Payer: Self-pay | Admitting: Vascular Surgery

## 2018-10-20 LAB — CBC
HCT: 39.3 % (ref 36.0–46.0)
Hemoglobin: 12.9 g/dL (ref 12.0–15.0)
MCH: 30.6 pg (ref 26.0–34.0)
MCHC: 32.8 g/dL (ref 30.0–36.0)
MCV: 93.1 fL (ref 80.0–100.0)
Platelets: 212 10*3/uL (ref 150–400)
RBC: 4.22 MIL/uL (ref 3.87–5.11)
RDW: 16.6 % — ABNORMAL HIGH (ref 11.5–15.5)
WBC: 7.2 10*3/uL (ref 4.0–10.5)
nRBC: 0 % (ref 0.0–0.2)

## 2018-10-20 LAB — GLUCOSE, CAPILLARY: Glucose-Capillary: 94 mg/dL (ref 70–99)

## 2018-10-20 MED ORDER — SODIUM CHLORIDE 0.9 % IV SOLN
INTRAVENOUS | Status: DC
  Administered 2018-10-21: 09:00:00 via INTRAVENOUS

## 2018-10-20 NOTE — Progress Notes (Signed)
While Dr.Konidena at bedside asked about continuing or discontinuing foley catheter since she is on PO lasix now and mentioned to MD that patient does not have an IV access site. She pulled out her port site last night. Verbal orders from MD to discontinue foley catheter and ok to leave without IV access site for now since angiogram is until tomorrow.

## 2018-10-20 NOTE — Progress Notes (Signed)
At bedside , RN notified this [facility] nurse that they will cancel the consult, pt. Reported that pt. pulled out her PORT twice already today. Pt is confused . Pt.is  Not on any IV meds and procedure is scheduled on Thursday. To access port on Thursday prior to procedure.

## 2018-10-20 NOTE — Progress Notes (Signed)
[place] at [place] NAME: Julia Gordon    MR#:  [id number]  DATE OF BIRTH:  09-30-1934  SUBJECTIVE: More alert today.  Denies any shortness of breath, leg pain.  CHIEF COMPLAINT:   Chief Complaint  Patient presents with  . Shortness of Breath  Very confused.  REVIEW OF SYSTEMS:    Review of Systems  Unable to perform ROS: Dementia  Has episodes of confusion, unable to give complete history.  No apparent distress noted.     Allergies  Allergen Reactions  . No Known Allergies     VITALS:  Blood pressure 115/81, pulse 98, temperature 97.7 F (36.5 C), temperature source Oral, resp. rate 18, height 5\' 2"  (1.575 m), weight 75.3 kg, SpO2 96 %.  PHYSICAL EXAMINATION:   Physical Exam  GENERAL:  84 y.o.-year-old patient lying in the bed with no acute distress.  EYES: Pupils equal, round, reactive to light. No scleral icterus.HEENT: Head atraumatic, normocephalic. Oropharynx and nasopharynx clear.  NECK:  Supple, no jugular venous distention. No thyroid enlargement, no tenderness.  LUNGS: Decreased breath sounds bilaterally, bibasilar creps heard. No use of accessory muscles of respiration.  CARDIOVASCULAR: S1, S2 normal. No murmurs, rubs, or gallops.  ABDOMEN: Soft, nontender, nondistended. Bowel sounds present. No organomegaly or mass.  EXTREMITIES: No cyanosis, clubbing, has left leg dressing present patient does have left leg dressing present NEUROLOGIC: No gross neurological deficit observed. PSYCHIATRIC: The patient is alert and oriented x 2.  SKIN: Has lower extremity wounds       LABORATORY PANEL:   CBC Recent Labs  Lab 10/20/18 0457  WBC 7.2  HGB 12.9  HCT 39.3  PLT 212   ------------------------------------------------------------------------------------------------------------------ Chemistries  Recent Labs  Lab 10/13/18 1816  10/17/18 0520  NA 139   < > 141  K 4.0   < > 3.2*  CL 103   < > 103  CO2 22   < >  27  GLUCOSE 106*   < > 117*  BUN 37*   < > 35*  CREATININE 1.33*   < > 1.27*  CALCIUM 8.8*   < > 8.2*  AST 29  --   --   ALT 11  --   --   ALKPHOS 60  --   --   BILITOT 1.6*  --   --    < > = values in this interval not displayed.   ------------------------------------------------------------------------------------------------------------------  Cardiac Enzymes No results for input(s): TROPONINI in the last 168 hours. ------------------------------------------------------------------------------------------------------------------  RADIOLOGY:  No results found.   ASSESSMENT AND PLAN:   84 year old elderly female patient with history of breast cancer, colon cancer, history of DVT on oxygen at nursing home, COPD, congestive heart failure with reduced ejection fraction of EF of 25 to 30%, hypertension, hyperlipidemia currently under hospitalist service  -Acute on chronic systolic heart failure exacerbation, EF 20 to 25%.  clinically improving.  Continue oral Lasix.  Discontinue Foley, start external Foley catheter. With patient's nurse.  -History of right leg DVT Continue anticoagulation with Xarelto  -Peripheral vascular disease with bilateral leg ulcers, worsening left leg ulcer with left leg ischemia which is chronic, scheduled to have left leg angiogram and possible left BKA.  Vascular surgery is planning to patient's daughter as patient is demented and cannot not given the consent-  Chronic atrial fibrillation Continue rate control with beta-blocker Continue Xarelto for anticoagulation  -Acute on chronic kidney disease stage III Monitor renal function as patient is  on diuretics  -Palliative care service on board; limited code.  -Long-term prognosis poor  -History of breast and colon cancer Supportive care Chronic respiratory failure, patient on 3 L of oxygen all the time.  Continue with oxygen. Disposition; back to nursing home , left leg angiogram on Thursday to  see if we can have angioplasty. All the records are reviewed and case discussed with Care Management/Social Worker. Management plans discussed with the patient, family and they are in agreement.  CODE STATUS: Partial code  DVT Prophylaxis: SCDs  TOTAL TIME TAKING CARE OF THIS PATIENT: 36 minutes.   POSSIBLE D/C IN 2 to 3 DAYS, DEPENDING ON CLINICAL CONDITION.  Epifanio Lesches M.D on 10/20/2018 at 2:47 PM  Between 7am to 6pm - Pager - [phone]  After 6pm go to [email or web address] - password [email or web address] [facility]  Office  [phone]  CC: Primary care physician; Ellamae Sia, MD  Note: This dictation was prepared with Dragon dictation along with smaller phrase technology. Any transcriptional errors that result from this process are unintentional.

## 2018-10-21 ENCOUNTER — Encounter: Payer: Self-pay | Admitting: Certified Registered Nurse Anesthetist

## 2018-10-21 ENCOUNTER — Encounter
Admission: EM | Disposition: A | Discharge status: Skilled Nursing Facility | Payer: Self-pay | Source: Skilled Nursing Facility | Attending: Internal Medicine

## 2018-10-21 ENCOUNTER — Encounter: Payer: Self-pay | Admitting: Vascular Surgery

## 2018-10-21 DIAGNOSIS — I70243 Atherosclerosis of native arteries of left leg with ulceration of ankle: Secondary | ICD-10-CM

## 2018-10-21 DIAGNOSIS — I70239 Atherosclerosis of native arteries of right leg with ulceration of unspecified site: Secondary | ICD-10-CM

## 2018-10-21 HISTORY — PX: LOWER EXTREMITY ANGIOGRAPHY: CATH118251

## 2018-10-21 LAB — GLUCOSE, CAPILLARY
Glucose-Capillary: 57 mg/dL — ABNORMAL LOW (ref 70–99)
Glucose-Capillary: 29 mg/dL — CL (ref 70–99)
Glucose-Capillary: 193 mg/dL — ABNORMAL HIGH (ref 70–99)
Glucose-Capillary: 140 mg/dL — ABNORMAL HIGH (ref 70–99)
Glucose-Capillary: 78 mg/dL (ref 70–99)
Glucose-Capillary: 48 mg/dL — ABNORMAL LOW (ref 70–99)
Glucose-Capillary: 75 mg/dL (ref 70–99)
Glucose-Capillary: 90 mg/dL (ref 70–99)
Glucose-Capillary: 123 mg/dL — ABNORMAL HIGH (ref 70–99)

## 2018-10-21 LAB — GLUCOSE, RANDOM
Glucose, Bld: 305 mg/dL — ABNORMAL HIGH (ref 70–99)
Glucose, Bld: 111 mg/dL — ABNORMAL HIGH (ref 70–99)

## 2018-10-21 LAB — BASIC METABOLIC PANEL
Anion gap: 14 (ref 5–15)
BUN: 31 mg/dL — ABNORMAL HIGH (ref 8–23)
CO2: 27 mmol/L (ref 22–32)
Calcium: 9.1 mg/dL (ref 8.9–10.3)
Chloride: 100 mmol/L (ref 98–111)
Creatinine, Ser: 1.08 mg/dL — ABNORMAL HIGH (ref 0.44–1.00)
GFR calc Af Amer: 55 mL/min — ABNORMAL LOW (ref >60)
GFR calc non Af Amer: 48 mL/min — ABNORMAL LOW (ref >60)
Glucose, Bld: 89 mg/dL (ref 70–99)
Potassium: 3.5 mmol/L (ref 3.5–5.1)
Sodium: 141 mmol/L (ref 135–145)

## 2018-10-21 SURGERY — LOWER EXTREMITY ANGIOGRAPHY
Anesthesia: Moderate Sedation | Laterality: Left

## 2018-10-21 MED ORDER — CEFAZOLIN SODIUM-DEXTROSE 2-4 GM/100ML-% IV SOLN
2.0000 g | INTRAVENOUS | Status: AC
  Filled 2018-10-21: qty 100

## 2018-10-21 MED ORDER — MIDAZOLAM HCL 2 MG/ML PO SYRP: 8.0000 mg | ORAL_SOLUTION | Freq: Once | ORAL | Status: DC | PRN

## 2018-10-21 MED ORDER — CEFAZOLIN SODIUM-DEXTROSE 1-4 GM/50ML-% IV SOLN
1.0000 g | Freq: Once | INTRAVENOUS | Status: AC
  Administered 2018-10-21: 12:00:00 1 g via INTRAVENOUS
  Filled 2018-10-21: qty 50

## 2018-10-21 MED ORDER — HYDROMORPHONE HCL 1 MG/ML IJ SOLN
1.0000 mg | Freq: Once | INTRAMUSCULAR | Status: AC | PRN
  Administered 2018-10-21: 1 mg via INTRAVENOUS
  Filled 2018-10-21: qty 1

## 2018-10-21 MED ORDER — ONDANSETRON HCL 4 MG/2ML IJ SOLN: 4.0000 mg | Freq: Four times a day (QID) | INTRAMUSCULAR | Status: DC | PRN

## 2018-10-21 MED ORDER — SODIUM CHLORIDE 0.9 % IV SOLN
INTRAVENOUS | Status: DC
  Administered 2018-10-22 (×2): via INTRAVENOUS

## 2018-10-21 MED ORDER — DEXTROSE-NACL 5-0.45 % IV SOLN
INTRAVENOUS | Status: DC
  Administered 2018-10-25: 08:00:00 via INTRAVENOUS

## 2018-10-21 MED ORDER — DEXTROSE 50 % IV SOLN
INTRAVENOUS | Status: AC
  Filled 2018-10-21: qty 50

## 2018-10-21 MED ORDER — HEPARIN SODIUM (PORCINE) 1000 UNIT/ML IJ SOLN
INTRAMUSCULAR | Status: AC
  Filled 2018-10-21: qty 1

## 2018-10-21 MED ORDER — FENTANYL CITRATE (PF) 100 MCG/2ML IJ SOLN
INTRAMUSCULAR | Status: AC
  Filled 2018-10-21: qty 2

## 2018-10-21 MED ORDER — MIDAZOLAM HCL 2 MG/2ML IJ SOLN
INTRAMUSCULAR | Status: DC | PRN
  Administered 2018-10-21: 2 mg via INTRAVENOUS

## 2018-10-21 MED ORDER — FENTANYL CITRATE (PF) 100 MCG/2ML IJ SOLN
INTRAMUSCULAR | Status: DC | PRN
  Administered 2018-10-21: 50 ug via INTRAVENOUS

## 2018-10-21 MED ORDER — DEXTROSE 50 % IV SOLN
12.5000 g | INTRAVENOUS | Status: AC
  Administered 2018-10-21: 12.5 g via INTRAVENOUS

## 2018-10-21 MED ORDER — HALOPERIDOL LACTATE 5 MG/ML IJ SOLN: 5.0000 mg | Freq: Once | INTRAMUSCULAR | Status: DC

## 2018-10-21 MED ORDER — METHYLPREDNISOLONE SODIUM SUCC 125 MG IJ SOLR: 125.0000 mg | Freq: Once | INTRAMUSCULAR | Status: DC | PRN

## 2018-10-21 MED ORDER — SODIUM CHLORIDE 0.9 % IV SOLN: INTRAVENOUS | Status: DC

## 2018-10-21 MED ORDER — DIPHENHYDRAMINE HCL 50 MG/ML IJ SOLN: 50.0000 mg | Freq: Once | INTRAMUSCULAR | Status: DC | PRN

## 2018-10-21 MED ORDER — DEXTROSE 50 % IV SOLN
25.0000 g | INTRAVENOUS | Status: AC
  Administered 2018-10-21: 25 g via INTRAVENOUS

## 2018-10-21 MED ORDER — DEXTROSE 50 % IV SOLN
INTRAVENOUS | Status: AC
  Administered 2018-10-21: 19:00:00 25 g via INTRAVENOUS
  Filled 2018-10-21: qty 50

## 2018-10-21 MED ORDER — DEXTROSE 50 % IV SOLN
25.0000 g | INTRAVENOUS | Status: AC
  Administered 2018-10-21: 19:00:00 25 g via INTRAVENOUS

## 2018-10-21 MED ORDER — MIDAZOLAM HCL 5 MG/5ML IJ SOLN
INTRAMUSCULAR | Status: AC
  Filled 2018-10-21: qty 5

## 2018-10-21 MED ORDER — FAMOTIDINE 20 MG PO TABS: 40.0000 mg | ORAL_TABLET | Freq: Once | ORAL | Status: DC | PRN

## 2018-10-21 MED ORDER — IODIXANOL 320 MG/ML IV SOLN
INTRAVENOUS | Status: DC | PRN
  Administered 2018-10-21: 85 mL via INTRA_ARTERIAL

## 2018-10-21 SURGICAL SUPPLY — 13 items
CATH CXI SUPP ST 4FR 90CM (CATHETERS) ×2 IMPLANT
CATH PIG 70CM (CATHETERS) ×2 IMPLANT
CATH VS15FR (CATHETERS) ×2 IMPLANT
COVER PROBE U/S 5X48 (MISCELLANEOUS) ×2 IMPLANT
DEVICE STARCLOSE SE CLOSURE (Vascular Products) ×2 IMPLANT
DEVICE TORQUE .025-.038 (MISCELLANEOUS) ×2 IMPLANT
GLIDECATH 4FR STR (CATHETERS) ×2 IMPLANT
GUIDEWIRE ANGLED .035 180CM (WIRE) ×2 IMPLANT
PACK ANGIOGRAPHY (CUSTOM PROCEDURE TRAY) ×2 IMPLANT
SHEATH BRITE TIP 5FRX11 (SHEATH) ×2 IMPLANT
SYR MEDRAD MARK 7 150ML (SYRINGE) ×2 IMPLANT
TUBING CONTRAST HIGH PRESS 72 (TUBING) ×2 IMPLANT
WIRE J 3MM .035X145CM (WIRE) ×2 IMPLANT

## 2018-10-21 NOTE — Progress Notes (Signed)
Serum glucose is 305, following 37.5 G of D50.  Serum glucose at 0730 was 89.  Suspect the CBG, especially the reading of 29 may have been inaccurate.  I witnessed the NT obtain this CBG and it was collected appropriately.

## 2018-10-21 NOTE — Anesthesia Preprocedure Evaluation (Addendum)
Anesthesia Evaluation  Patient identified by MRN, date of birth, ID band Patient awake    Reviewed: Allergy & Precautions, NPO status , Patient's Chart, lab work & pertinent test results  History of Anesthesia Complications Negative for: history of anesthetic complications  Airway Mallampati: II       Dental  (+) Missing, Chipped   Pulmonary neg sleep apnea, COPD,  COPD inhaler, former smoker,    breath sounds clear to auscultation- rhonchi (-) wheezing      Cardiovascular hypertension, Pt. on medications +CHF  (-) Past MI + dysrhythmias Atrial Fibrillation + pacemaker + Cardiac Defibrillator + Valvular Problems/Murmurs AI and MR  Rhythm:Irregular Rate:Normal - Systolic murmurs and - Diastolic murmurs    Neuro/Psych neg Seizures PSYCHIATRIC DISORDERS Depression CVA    GI/Hepatic Neg liver ROS, neg GERD  ,  Endo/Other  neg diabetes  Renal/GU negative Renal ROS     Musculoskeletal  (+) Arthritis ,   Abdominal (+) + obese,   Peds negative pediatric ROS (+)  Hematology  (+) anemia ,   Anesthesia Other Findings Past Medical History: No date: Anemia 2015: Breast cancer ([place])     Comment:  a. L mastectomy with chemoradiation No date: Colon adenocarcinoma ([place]) No date: COPD (chronic obstructive pulmonary disease) ([facility]) No date: Depression No date: DJD (degenerative joint disease) of knee     Comment:  right knee No date: DVT (deep venous thrombosis) ([facility])     Comment:  a. 06/2018 L popliteal and R peroneal DVTs-->Xarelto. No date: H/O hysterectomy with oophorectomy     Comment:  for DUB No date: HFrEF (heart failure with reduced ejection fraction) ([place])     Comment:  a. 06/2018 Echo: EF 25-30%. No date: History of depression No date: HLD (hyperlipidemia) No date: HTN (hypertension) No date: LV (left ventricular) mural thrombus     Comment:  history of, resolved (echo 04/09) No date: NICM (nonischemic  cardiomyopathy) ([place])     Comment:  a. Reports prior h/o cath @ [facility]; b. status post [place] with [employer] lead in 2009 at [facility], Dr.               Boyd Kerbs; c. 2014 - prev seen by G. Lovena Le, MD; c. 06/2018               Echo: EF 25-30%, DD. Diff HK. RVSP 84mmHg. Mildly dil RA.              Mild to mod MR. Mod TR. Mild AI. No date: Persistent atrial fibrillation ([place])     Comment:  a. noted in [place] from 2008-2009; b.               CHADS2VASc 6 (CHF, HTN, age x 2, vascular disease,               female)-->eliquis added 06/2018 in setting of LLE DVT. No date: Valvular heart disease     Comment:  a. 06/2018 Echo: Mod TR, mild to mod MR, mild AI.  Reproductive/Obstetrics                            Anesthesia Physical  Anesthesia Plan  ASA: IV  Anesthesia Plan: General   Post-op Pain Management:    Induction: Intravenous  PONV Risk Score and Plan: 2 and Propofol infusion  Airway Management Planned: Natural Airway  Additional Equipment:   Intra-op Plan:   Post-operative Plan:   Informed Consent: I have reviewed the patients History and Physical, chart, labs and discussed the procedure including the risks, benefits and alternatives for the proposed anesthesia with the patient or authorized representative who has indicated his/her understanding and acceptance.     Dental advisory given and Consent reviewed with POA  Plan Discussed with: CRNA and Anesthesiologist  Anesthesia Plan Comments: (Plan for local by surgeon with IVA, discussed possible GA as backup)       Anesthesia Quick Evaluation

## 2018-10-21 NOTE — Progress Notes (Signed)
Patient's angiogram of left leg is essentially unremarkable, patient leg ulcers are secondary to poor EF.  And poor circulation due to poor EF.  Vascular recommends wound care.  Possible discharge tomorrow.

## 2018-10-21 NOTE — Progress Notes (Signed)
[facility] Daily Progress Note   Will plan on left lower extremity wound debridement in OR. Dr. Lucky Cowboy has spoken with family in regard to procedure, risks and benefits. Family would like to proceed.  Scheduled tomorrow at 1:00pm.  Marcelle Overlie PA-C 10/21/2018 3:37 PM

## 2018-10-21 NOTE — Progress Notes (Signed)
Patient is lethargic, difficult to arouse.  CBG is 48.25 mg of D50 given. Repeat CBG is 90. Serum glucose is 111.  Patient is more easily arroused, but quickly goes back to sleep

## 2018-10-21 NOTE — Progress Notes (Signed)
Wounds on left lower leg dressed.  Pre-medicated with Dilaudid 1 mg. Cleansed with soap and water.  Silvadene app-lied to open wounds.  Wrapped in kerlix.  Elevated on a pillow, althoughshe will not keep it there.

## 2018-10-21 NOTE — Progress Notes (Addendum)
Hypoglycemic Event  CBG: 57  Treatment: Dextrose 12.5 G IVP Symptoms: None  Follow-up CBG: Time: 0924 CBG Result: 29  Possible Reasons for Event: NPO  Comments/MD notified: Dr. Vianne Bulls  [phone] Patient sleepy, responds verbally to my requests. [id number]: Dextrose  50  25 G given IVP.  Serum glucose drawn and sent stat. [id number]: CBG 193. Dr. Vianne Bulls updated.    Remigio Eisenmenger

## 2018-10-21 NOTE — H&P (Signed)
[facility] History & Physical Update  The patient was interviewed and re-examined.  The patient's previous History and Physical has been reviewed and is unchanged.  There is no change in the plan of care. We plan to proceed with the scheduled procedure.  Leotis Pain, MD  10/21/2018, 11:43 AM

## 2018-10-21 NOTE — Care Management Important Message (Signed)
Important Message  Patient Details  Name: Julia Gordon MRN: [id number] Date of Birth: [birth year]   Medicare Important Message Given:  Yes     Dannette Barbara 10/21/2018, 2:14 PM

## 2018-10-21 NOTE — Op Note (Signed)
[facility]  Percutaneous Study/Intervention Procedural Note   Date of Surgery: 10/21/2018  Surgeon(s):Jeaninne Lodico    Assistants:none  Pre-operative Diagnosis: PAD with bilateral lower extremity ulcerations  Post-operative diagnosis:  Same  Procedure(s) Performed:             1.  Ultrasound guidance for vascular access right femoral artery             2.  Catheter placement into left SFA from right femoral approach             3.  Aortogram and selective left lower extremity angiogram and right lower extremity angiogram through the sheath             4.  StarClose closure device right femoral artery  EBL: 10 cc  Contrast: 85 cc  Fluoro Time: 8.4 minutes  Moderate Conscious Sedation Time: approximately 30 minutes using 2 mg of Versed and 50 mcg of Fentanyl              Indications:  Patient is a 84 y.o.female with bilateral nonhealing ulcerations.  She has had a no improvement after previous intervention on the left leg and her foot is cooler and there was concern for recurrent malperfusion.  She has ulcers bilaterally, but her left leg ulcers are the worse of the 2.  The patient is brought in for angiography for further evaluation and potential treatment.  Due to the limb threatening nature of the situation, angiogram was performed for attempted limb salvage. The patient is aware that if the procedure fails, amputation would be expected.  The patient also understands that even with successful revascularization, amputation may still be required due to the severity of the situation.  Risks and benefits are discussed and informed consent is obtained.   Procedure:  The patient was identified and appropriate procedural time out was performed.  The patient was then placed supine on the table and prepped and draped in the usual sterile fashion. Moderate conscious sedation was administered during a face to face encounter with the patient throughout the procedure with my  supervision of the RN administering medicines and monitoring the patient's vital signs, pulse oximetry, telemetry and mental status throughout from the start of the procedure until the patient was taken to the recovery room. Ultrasound was used to evaluate the right common femoral artery.  It was patent .  A digital ultrasound image was acquired.  A Seldinger needle was used to access the right common femoral artery under direct ultrasound guidance and a permanent image was performed.  A 0.035 J wire was advanced without resistance and a 5Fr sheath was placed.  Pigtail catheter was placed into the aorta and an AP aortogram was performed. This demonstrated normal renal arteries and tortuous but not stenotic aorta and iliac segments without significant stenosis. I then crossed the aortic bifurcation tediously with a V S1 catheter and a floppy Glidewire then exchanging for a CXI catheter and advanced to the mid left SFA.  After initial imaging the catheter was pulled back to just above the femoral head to evaluate the distal iliac and femoral bifurcation. Selective left lower extremity angiogram was then performed. This demonstrated extremely slow circulation time secondary to her severe reduced ejection fraction.  Her vessels were calcific but she actually had relatively normal flow with no hemodynamically significant stenosis in the SFA, popliteal, and all 3 tibial vessels provided flow distally although the posterior tibial artery was the dominant runoff to the foot.  The previous interventions in the popliteal artery and posterior tibial artery were patent without significant recurrent stenosis.  From a vascular standpoint, she has good flow although her ejection fraction is markedly reduced.  No intervention on the left leg would be of benefit.  I would recommend local wound care and if this fails consider above-knee amputation.  Rather than bring her back for a right leg angiogram as had been previously  discussed, I elected to perform imaging through the right femoral sheath to evaluate the right leg.  This demonstrated calcific but not stenotic common femoral artery, SFA, and a difficult to visualize popliteal artery due to her knee replacement and femoral hardware from her femur fracture but the flow was reasonably good through the popliteal artery.  The posterior tibial artery was continuous to the foot and did not have an obvious stenosis although this was very difficult to evaluate due to her very slow circulation time.  The other 2 tibial vessels were not seen and again this may have been due to her poor ejection fraction and not having a catheter in the distal circulation to help evaluate distal flow.  I elected to terminate the procedure. The sheath was removed and StarClose closure device was deployed in the right femoral artery with excellent hemostatic result. The patient was taken to the recovery room in stable condition having tolerated the procedure well.  Findings:               Aortogram:  The aorta and iliac arteries were very tortuous but not stenotic.  Renal arteries are patent without significant stenosis.             Left lower Extremity:  This demonstrated extremely slow circulation time secondary to her severe reduced ejection fraction.  Her vessels were calcific but she actually had relatively normal flow with no hemodynamically significant stenosis in the SFA, popliteal, and all 3 tibial vessels provided flow distally although the posterior tibial artery was the dominant runoff to the foot.  The previous interventions in the popliteal artery and posterior tibial artery were patent without significant recurrent stenosis.  Right lower extremity:  This demonstrated calcific but not stenotic common femoral artery, SFA, and a difficult to visualize popliteal artery due to her knee replacement and femoral hardware from her femur fracture but the flow was reasonably good through the popliteal  artery.  The posterior tibial artery was continuous to the foot and did not have an obvious stenosis although this was very difficult to evaluate due to her very slow circulation time.  The other 2 tibial vessels were not seen and again this may have been due to her poor ejection fraction and not having a catheter in the distal circulation to help evaluate distal flow.   Disposition: Patient was taken to the recovery room in stable condition having tolerated the procedure well.  Complications: None  Leotis Pain 10/21/2018 12:55 PM   This note was created with Dragon Medical transcription system. Any errors in dictation are purely unintentional.

## 2018-10-21 NOTE — Progress Notes (Signed)
Consent obtained from her daughter, Curlene Dolphin, over the phone.  Colletta [place] Summer witnessed the consent.

## 2018-10-21 NOTE — Progress Notes (Addendum)
[place] at [place] NAME: Julia Gordon    MR#:  [id number]  DATE OF BIRTH:  28-Jan-1934  SUBJECTIVE: .  Patient continues to have hypoglycemia this morning.  CHIEF COMPLAINT:   Chief Complaint  Patient presents with  . Shortness of Breath  Very confused.  REVIEW OF SYSTEMS:    Review of Systems  Unable to perform ROS: Dementia  Has episodes of confusion, unable to give complete history.  No apparent distress noted.     Allergies  Allergen Reactions  . No Known Allergies     VITALS:  Blood pressure (!) 102/91, pulse 97, temperature 97.6 F (36.4 C), temperature source Oral, resp. rate (!) 22, height 5\' 2"  (1.575 m), weight 75.3 kg, SpO2 90 %.  PHYSICAL EXAMINATION:   Physical Exam  GENERAL:  84 y.o.-year-old patient lying in the bed with no acute distress.  Confused.   EYES: Pupils equal, round, reactive to light. No scleral icterus.HEENT: Head atraumatic, normocephalic. Oropharynx and nasopharynx clear.  NECK:  Supple, no jugular venous distention. No thyroid enlargement, no tenderness.  LUNGS: Decreased breath sounds bilaterally, bibasilar creps heard. No use of accessory muscles of respiration.  CARDIOVASCULAR: S1, S2 normal. No murmurs, rubs, or gallops.  ABDOMEN: Soft, nontender, nondistended. Bowel sounds present. No organomegaly or mass.  EXTREMITIES: No cyanosis, clubbing, has left leg dressing present patient does have left leg dressing present NEUROLOGIC: No gross neurological deficit observed. PSYCHIATRIC: The patient is alert and oriented x 2.  SKIN: Has lower extremity wounds       LABORATORY PANEL:   CBC Recent Labs  Lab 10/20/18 0457  WBC 7.2  HGB 12.9  HCT 39.3  PLT 212   ------------------------------------------------------------------------------------------------------------------ Chemistries  Recent Labs  Lab 10/21/18 0434  NA 141  K 3.5  CL 100  CO2 27  GLUCOSE 89  BUN 31*  CREATININE  1.08*  CALCIUM 9.1   ------------------------------------------------------------------------------------------------------------------  Cardiac Enzymes No results for input(s): TROPONINI in the last 168 hours. ------------------------------------------------------------------------------------------------------------------  RADIOLOGY:  No results found.   ASSESSMENT AND PLAN:   84 year old elderly female patient with history of breast cancer, colon cancer, history of DVT on oxygen at nursing home, COPD, congestive heart failure with reduced ejection fraction of EF of 25 to 30%, hypertension, hyperlipidemia currently under hospitalist service  -Acute on chronic systolic heart failure exacerbation, EF 20 to 25%. Improving, continue diuretics, beta-blockers  -History of right leg DVT Continue anticoagulation with Xarelto  -Peripheral vascular disease with bilateral leg ulcers, worsening left leg ulcer with left leg ischemia which is chronic, scheduled to have left leg angiogram and possible left BKA.  for angiogram today.  Chronic atrial fibrillation Continue rate control with beta-blocker Continue Xarelto for anticoagulation  -Acute on chronic kidney disease stage III Monitor renal function as patient is on diuretics  -Palliative care service on board; limited code.  Recurrent hypoglycemia: Patient is not on insulin.  Improved with D50's, Start D 5 infusion at 30 mL/h to prevent recurrent hypoglycemia while patient is n.p.o.  -Long-term prognosis poor  -History of breast and colon cancer Supportive care Chronic respiratory failure, patient on 3 L of oxygen all the time.  Continue with oxygen. Disposition; back to nursing home , left leg angiogram on Thursday to see if we can have angioplasty. All the records are reviewed and case discussed with Care Management/Social Worker. Management plans discussed with the patient, family and they are in agreement.  CODE STATUS:  Partial code  DVT Prophylaxis: SCDs  TOTAL TIME TAKING CARE OF THIS PATIENT: 36 minutes.   POSSIBLE D/C IN 2 to 3 DAYS, DEPENDING ON CLINICAL CONDITION.  Epifanio Lesches M.D on 10/21/2018 at 10:04 AM  Between 7am to 6pm - Pager - [phone]  After 6pm go to [email or web address] - password [email or web address] [facility]  Office  [phone]  CC: Primary care physician; Ellamae Sia, MD  Note: This dictation was prepared with Dragon dictation along with smaller phrase technology. Any transcriptional errors that result from this process are unintentional.

## 2018-10-21 NOTE — Progress Notes (Signed)
PT Cancellation Note  Patient Details Name: Julia Gordon MRN: [id number] DOB: [birth year]   Cancelled Treatment:    Reason Eval/Treat Not Completed: Patient at procedure or test/unavailable   Pt out of room for procedure.  Will continue as appropriate.   Chesley Noon 10/21/2018, 12:22 PM

## 2018-10-22 ENCOUNTER — Encounter
Admission: EM | Disposition: A | Discharge status: Skilled Nursing Facility | Payer: Self-pay | Source: Skilled Nursing Facility | Attending: Internal Medicine

## 2018-10-22 ENCOUNTER — Inpatient Hospital Stay: Payer: Medicare Other | Admitting: Anesthesiology

## 2018-10-22 ENCOUNTER — Inpatient Hospital Stay: Payer: Medicare Other

## 2018-10-22 DIAGNOSIS — I70243 Atherosclerosis of native arteries of left leg with ulceration of ankle: Secondary | ICD-10-CM

## 2018-10-22 HISTORY — PX: WOUND DEBRIDEMENT: SHX247

## 2018-10-22 LAB — BASIC METABOLIC PANEL
Anion gap: 4 — ABNORMAL LOW (ref 5–15)
BUN: 30 mg/dL — ABNORMAL HIGH (ref 8–23)
CO2: 30 mmol/L (ref 22–32)
Calcium: 8.5 mg/dL — ABNORMAL LOW (ref 8.9–10.3)
Chloride: 105 mmol/L (ref 98–111)
Creatinine, Ser: 0.96 mg/dL (ref 0.44–1.00)
GFR calc Af Amer: >60 mL/min (ref >60)
GFR calc non Af Amer: 55 mL/min — ABNORMAL LOW (ref >60)
Glucose, Bld: 117 mg/dL — ABNORMAL HIGH (ref 70–99)
Potassium: 3.2 mmol/L — ABNORMAL LOW (ref 3.5–5.1)
Sodium: 139 mmol/L (ref 135–145)

## 2018-10-22 LAB — GLUCOSE, RANDOM: Glucose, Bld: 102 mg/dL — ABNORMAL HIGH (ref 70–99)

## 2018-10-22 LAB — CBC
HCT: 36.9 % (ref 36.0–46.0)
Hemoglobin: 11.8 g/dL — ABNORMAL LOW (ref 12.0–15.0)
MCH: 30.3 pg (ref 26.0–34.0)
MCHC: 32 g/dL (ref 30.0–36.0)
MCV: 94.9 fL (ref 80.0–100.0)
Platelets: 195 10*3/uL (ref 150–400)
RBC: 3.89 MIL/uL (ref 3.87–5.11)
RDW: 16.9 % — ABNORMAL HIGH (ref 11.5–15.5)
WBC: 7.3 10*3/uL (ref 4.0–10.5)
nRBC: 0 % (ref 0.0–0.2)

## 2018-10-22 LAB — GLUCOSE, CAPILLARY
Glucose-Capillary: 93 mg/dL (ref 70–99)
Glucose-Capillary: 80 mg/dL (ref 70–99)
Glucose-Capillary: 82 mg/dL (ref 70–99)
Glucose-Capillary: 36 mg/dL — CL (ref 70–99)

## 2018-10-22 LAB — BLOOD GAS, ARTERIAL
Acid-Base Excess: 4.5 mmol/L — ABNORMAL HIGH (ref 0.0–2.0)
Bicarbonate: 30.4 mmol/L — ABNORMAL HIGH (ref 20.0–28.0)
FIO2: 0.28
O2 Saturation: 99 %
Patient temperature: 37
pCO2 arterial: 49 mmHg — ABNORMAL HIGH (ref 32.0–48.0)
pH, Arterial: 7.4 (ref 7.350–7.450)
pO2, Arterial: 133 mmHg — ABNORMAL HIGH (ref 83.0–108.0)

## 2018-10-22 LAB — TYPE AND SCREEN
ABO/RH(D): B POS
Antibody Screen: NEGATIVE

## 2018-10-22 LAB — PROTIME-INR
INR: 2.1 — ABNORMAL HIGH (ref 0.8–1.2)
Prothrombin Time: 23.4 seconds — ABNORMAL HIGH (ref 11.4–15.2)

## 2018-10-22 LAB — APTT: aPTT: 41 seconds — ABNORMAL HIGH (ref 24–36)

## 2018-10-22 LAB — MAGNESIUM: Magnesium: 2.1 mg/dL (ref 1.7–2.4)

## 2018-10-22 SURGERY — DEBRIDEMENT, WOUND
Anesthesia: General | Site: Leg Lower | Laterality: Left

## 2018-10-22 MED ORDER — LIDOCAINE HCL (PF) 1 % IJ SOLN
INTRAMUSCULAR | Status: AC
  Filled 2018-10-22: qty 30

## 2018-10-22 MED ORDER — ONDANSETRON HCL 4 MG/2ML IJ SOLN: 4.0000 mg | Freq: Once | INTRAMUSCULAR | Status: DC | PRN

## 2018-10-22 MED ORDER — BUPIVACAINE HCL (PF) 0.5 % IJ SOLN
INTRAMUSCULAR | Status: AC
  Filled 2018-10-22: qty 30

## 2018-10-22 MED ORDER — FENTANYL CITRATE (PF) 100 MCG/2ML IJ SOLN: 25.0000 ug | INTRAMUSCULAR | Status: DC | PRN

## 2018-10-22 MED ORDER — PHENYLEPHRINE HCL (PRESSORS) 10 MG/ML IV SOLN
INTRAVENOUS | Status: DC | PRN
  Administered 2018-10-22 (×4): 50 ug via INTRAVENOUS

## 2018-10-22 MED ORDER — CEFAZOLIN SODIUM-DEXTROSE 2-3 GM-%(50ML) IV SOLR
INTRAVENOUS | Status: DC | PRN
  Administered 2018-10-22: 2 g via INTRAVENOUS

## 2018-10-22 MED ORDER — FENTANYL CITRATE (PF) 100 MCG/2ML IJ SOLN
INTRAMUSCULAR | Status: AC
  Filled 2018-10-22: qty 2

## 2018-10-22 MED ORDER — PROPOFOL 500 MG/50ML IV EMUL
INTRAVENOUS | Status: DC | PRN
  Administered 2018-10-22: 35 ug/kg/min via INTRAVENOUS

## 2018-10-22 MED ORDER — POTASSIUM CHLORIDE 10 MEQ/100ML IV SOLN
10.0000 meq | INTRAVENOUS | Status: AC
  Administered 2018-10-22 (×4): 10 meq via INTRAVENOUS
  Filled 2018-10-22 (×4): qty 100

## 2018-10-22 MED ORDER — PROPOFOL 10 MG/ML IV BOLUS
INTRAVENOUS | Status: AC
  Filled 2018-10-22: qty 20

## 2018-10-22 MED ORDER — KETAMINE HCL 50 MG/ML IJ SOLN
INTRAMUSCULAR | Status: DC | PRN
  Administered 2018-10-22 (×2): 25 mg via INTRAMUSCULAR

## 2018-10-22 MED ORDER — KETAMINE HCL 50 MG/ML IJ SOLN
INTRAMUSCULAR | Status: AC
  Filled 2018-10-22: qty 10

## 2018-10-22 MED ORDER — IPRATROPIUM-ALBUTEROL 0.5-2.5 (3) MG/3ML IN SOLN
3.0000 mL | Freq: Two times a day (BID) | RESPIRATORY_TRACT | Status: DC
  Administered 2018-10-22 – 2018-10-26 (×7): 3 mL via RESPIRATORY_TRACT
  Filled 2018-10-22 (×8): qty 3

## 2018-10-22 SURGICAL SUPPLY — 40 items
BNDG COHESIVE 4X5 TAN STRL (GAUZE/BANDAGES/DRESSINGS) ×2 IMPLANT
BNDG GAUZE 4.5X4.1 6PLY STRL (MISCELLANEOUS) ×4 IMPLANT
BNDG UNNA PASTE 4X10 CALAMINE (CAST SUPPLIES) ×2 IMPLANT
BRUSH SCRUB EZ  4% CHG (MISCELLANEOUS) ×2
BRUSH SCRUB EZ 4% CHG (MISCELLANEOUS) ×1 IMPLANT
CANISTER SUCT 1200ML W/VALVE (MISCELLANEOUS) ×3 IMPLANT
CANISTER WOUND CARE 500ML ATS (WOUND CARE) IMPLANT
CHLORAPREP W/TINT 26 (MISCELLANEOUS) ×3 IMPLANT
COVER WAND RF STERILE (DRAPES) ×3 IMPLANT
DRAPE INCISE IOBAN 66X45 STRL (DRAPES) ×3 IMPLANT
DRSG VAC ATS MED SENSATRAC (GAUZE/BANDAGES/DRESSINGS) IMPLANT
ELECT CAUTERY BLADE 6.4 (BLADE) ×3 IMPLANT
ELECT REM PT RETURN 9FT ADLT (ELECTROSURGICAL) ×3
ELECTRODE REM PT RTRN 9FT ADLT (ELECTROSURGICAL) ×1 IMPLANT
GLOVE BIO SURGEON STRL SZ7 (GLOVE) ×6 IMPLANT
GLOVE BIOGEL PI IND STRL 7.5 (GLOVE) ×1 IMPLANT
GLOVE BIOGEL PI INDICATOR 7.5 (GLOVE) ×2
GOWN STRL REUS W/ TWL LRG LVL3 (GOWN DISPOSABLE) ×2 IMPLANT
GOWN STRL REUS W/ TWL XL LVL3 (GOWN DISPOSABLE) ×1 IMPLANT
GOWN STRL REUS W/TWL LRG LVL3 (GOWN DISPOSABLE) ×4
GOWN STRL REUS W/TWL XL LVL3 (GOWN DISPOSABLE) ×2
IV NS 1000ML (IV SOLUTION) ×2
IV NS 1000ML BAXH (IV SOLUTION) ×1 IMPLANT
KIT TURNOVER KIT A (KITS) ×3 IMPLANT
LABEL OR SOLS (LABEL) ×3 IMPLANT
NS IRRIG 500ML POUR BTL (IV SOLUTION) ×3 IMPLANT
PACK EXTREMITY ARMC (MISCELLANEOUS) ×3 IMPLANT
PAD PREP 24X41 OB/GYN DISP (PERSONAL CARE ITEMS) ×3 IMPLANT
PULSAVAC PLUS IRRIG FAN TIP (DISPOSABLE) ×3
SOL PREP PVP 2OZ (MISCELLANEOUS) ×3
SOLUTION PREP PVP 2OZ (MISCELLANEOUS) ×1 IMPLANT
SPONGE LAP 18X18 RF (DISPOSABLE) ×3 IMPLANT
SUT ETHILON 4-0 (SUTURE)
SUT ETHILON 4-0 FS2 18XMFL BLK (SUTURE)
SUT VIC AB 3-0 SH 27 (SUTURE)
SUT VIC AB 3-0 SH 27X BRD (SUTURE) IMPLANT
SUTURE ETHLN 4-0 FS2 18XMF BLK (SUTURE) IMPLANT
SWAB CULTURE AMIES ANAERIB BLU (MISCELLANEOUS) ×3 IMPLANT
SYR BULB IRRIG 60ML STRL (SYRINGE) ×3 IMPLANT
TIP FAN IRRIG PULSAVAC PLUS (DISPOSABLE) ×1 IMPLANT

## 2018-10-22 NOTE — Progress Notes (Signed)
[place] at [place] NAME: Julia Gordon    MR#:  [id number]  DATE OF BIRTH:  [birth year]  SUBJECTIVE: .  Complains of left leg pain, patient is n.p.o. for left leg ulcer debridement.  Patient is initially admitted for CHF exacerbation and also left leg ulcer, because left leg ulcers worsen, vascular did the left leg angiogram yesterday which did not show any abnormality.  CHIEF COMPLAINT:   Chief Complaint  Patient presents with  . Shortness of Breath  Very confused.  REVIEW OF SYSTEMS:    Review of Systems  Unable to perform ROS: Dementia  Has episodes of confusion, unable to give complete history.  No apparent distress noted.     Allergies  Allergen Reactions  . No Known Allergies     VITALS:  Blood pressure 108/86, pulse 95, temperature (!) 97 F (36.1 C), temperature source Tympanic, resp. rate 14, height 5\' 2"  (1.575 m), weight 81.6 kg, SpO2 97 %.  PHYSICAL EXAMINATION:   Physical Exam  GENERAL:  84 y.o.-year-old patient lying in the bed with no acute distress.  Confused.   EYES: Pupils equal, round, reactive to light. No scleral icterus.HEENT: Head atraumatic, normocephalic. Oropharynx and nasopharynx clear.  NECK:  Supple, no jugular venous distention. No thyroid enlargement, no tenderness.  LUNGS: Decreased breath sounds bilaterally, bibasilar creps heard. No use of accessory muscles of respiration.  CARDIOVASCULAR: S1, S2 normal. No murmurs, rubs, or gallops.  ABDOMEN: Soft, nontender, nondistended. Bowel sounds present. No organomegaly or mass.  EXTREMITIES: No cyanosis, clubbing, has left leg dressing present patient does have left leg dressing present NEUROLOGIC: No gross neurological deficit observed. PSYCHIATRIC: The patient is alert and oriented x 2.  SKIN: Has lower extremity wounds       LABORATORY PANEL:   CBC Recent Labs  Lab 10/22/18 0146  WBC 7.3  HGB 11.8*  HCT 36.9  PLT 195    ------------------------------------------------------------------------------------------------------------------ Chemistries  Recent Labs  Lab 10/22/18 0146  NA 139  K 3.2*  CL 105  CO2 30  GLUCOSE 117*  BUN 30*  CREATININE 0.96  CALCIUM 8.5*  MG 2.1   ------------------------------------------------------------------------------------------------------------------  Cardiac Enzymes No results for input(s): TROPONINI in the last 168 hours. ------------------------------------------------------------------------------------------------------------------  RADIOLOGY:  Ct Head Wo Contrast  Result Date: 10/22/2018 CLINICAL DATA:  84 year old female with lethargy, altered mental status. EXAM: CT HEAD WITHOUT CONTRAST TECHNIQUE: Contiguous axial images were obtained from the base of the skull through the vertex without intravenous contrast. COMPARISON:  Head CT 10/01/2018. FINDINGS: Brain: There has been a vault encephalomalacia since 10/01/2018 at the left inferior frontal gyrus infarct (left MCA, anterior division). No associated hemorrhage or mass effect. Elsewhere gray-white matter differentiation appears stable. No midline shift, ventriculomegaly, mass effect, evidence of mass lesion, intracranial hemorrhage or new cortically based acute infarction. Vascular: Calcified atherosclerosis at the skull base. No suspicious intracranial vascular hyperdensity. Skull: No acute osseous abnormality identified. Sinuses/Orbits: Visualized paranasal sinuses and mastoids are stable and well pneumatized. Other: No acute orbit or scalp soft tissue finding. IMPRESSION: 1. The left MCA anterior division territory infarct shows some evolution since 10/01/2018, but no associated hemorrhage or mass effect. 2.  No new intracranial abnormality. Electronically Signed   By: Genevie Ann M.D.   On: 10/22/2018 02:25     ASSESSMENT AND PLAN:   84 year old elderly female patient with history of breast cancer, colon  cancer, history of DVT on oxygen at nursing home, COPD, congestive heart failure with  reduced ejection fraction of EF of 25 to 30%, hypertension, hyperlipidemia currently under hospitalist service  -Acute on chronic systolic heart failure exacerbation, EF 20 to 25%. Improving, patient very confused, has baseline dementia.  Continue oral diuretics, Entresto, beta-blockers,/  -History of right leg DVT Continue anticoagulation with Xarelto  -Peripheral vascular disease with bilateral leg ulcers, worsening left leg ulcer with left leg ischemia which is chronic, left leg angiogram showed no significant stenosis.  Spoke with vascular yesterday, patient has left foot ulcers because of poor EF and chronic venous stasis.  Regarding left leg ulcer patient went for ulcer debridement today.  Chronic atrial fibrillation Continue rate control with beta-blocker Continue Xarelto for anticoagulation  -Acute on chronic kidney disease stage III Monitor renal function as patient is on diuretics  -Palliative care service on board; limited code.  Recurrent hypoglycemia:  improved.  -Long-term prognosis poor  -History of breast and colon cancer Supportive care Severe Alzheimer's dementia, patient has still a Actuary.  Patient needs to be off telemetry sitter for 24 hours before discharging back: [facility].   All the records are reviewed and case discussed with Care Management/Social Worker. Management plans discussed with the patient, family and they are in agreement.  CODE STATUS: Partial code  DVT Prophylaxis: SCDs  TOTAL TIME TAKING CARE OF THIS PATIENT: 36 minutes.   POSSIBLE D/C IN 2 to 3 DAYS, DEPENDING ON CLINICAL CONDITION.  Epifanio Lesches M.D on 10/22/2018 at 1:16 PM  Between 7am to 6pm - Pager - [phone]  After 6pm go to [email or web address] - password [email or web address] [facility]  Office  [phone]  CC: Primary care physician; Ellamae Sia, MD  Note:  This dictation was prepared with Dragon dictation along with smaller phrase technology. Any transcriptional errors that result from this process are unintentional.

## 2018-10-22 NOTE — Progress Notes (Signed)
Patient become more alert , and was able to hold a conversation for few minute.

## 2018-10-22 NOTE — Anesthesia Post-op Follow-up Note (Signed)
Anesthesia QCDR form completed.        

## 2018-10-22 NOTE — Anesthesia Procedure Notes (Signed)
Performed by: Demetrius Charity, CRNA Pre-anesthesia Checklist: Patient identified, Emergency Drugs available, Suction available, Patient being monitored and Timeout performed Oxygen Delivery Method: Simple face mask Induction Type: IV induction

## 2018-10-22 NOTE — Progress Notes (Signed)
CBG was 32.  Patient is awake and alert, and chatting with the staff.  After the difficulties getting accurate CBG yesterday I held off treatment and drew a STAT serum glucose  This came back at 102.  No D 50 will be given.  Both CBG and serum glucose were drawn before the patient had anything to eat.

## 2018-10-22 NOTE — Discharge Instructions (Addendum)
Vascular Surgery Discharge Instructions 1) Place bilateral legs in space boots to avoid new / worsening pressure wounds. 2) Zinc Oxide Unna Boot to the left lower extremity. Changed weekly.

## 2018-10-22 NOTE — Op Note (Signed)
    OPERATIVE NOTE   PROCEDURE: 1. Irrigation and debridement of skin, soft tissue, and muscle of four wounds on the left leg (7 x 6 cm, 5 x 5 cm, 5 x 4 cm, and 3 x 1.5 cm) for approximately 90 cm2  PRE-OPERATIVE DIAGNOSIS: Nonviable tissue and necrotic eschar of multiple wounds on the left leg  POST-OPERATIVE DIAGNOSIS: Same as above  SURGEON: Leotis Pain, MD  ASSISTANT(S): Hezzie Bump, PA-C  ANESTHESIA: MAC  ESTIMATED BLOOD LOSS: 15 cc  FINDING(S): None  SPECIMEN(S):  none  INDICATIONS:   Julia Gordon is a 84 y.o. female who presents with multiple wounds of the left leg with necrotic eschar.  She has undergone revascularization for some PAD previously.  We are debriding the wound to try to remove dead tissue and promote wound healing. An assistant was present during the procedure to help facilitate the exposure and expedite the procedure.  DESCRIPTION: After obtaining full informed written consent, the patient was brought back to the operating room and placed supine upon the operating table.  The patient received IV antibiotics prior to induction. The assistant provided retraction and mobilization to help facilitate exposure and expedite the procedure throughout the entire procedure.  This included following suture, using retractors, and optimizing lighting. After obtaining adequate anesthesia, the patient was prepped and draped in the standard fashion.  The wound was then opened and excisional debridement was performed to each of the four wounds on the left leg with necrotic eschar to remove all clearly non-viable tissue. There were several smaller, more superficial wounds which were not debrided. The tissue was taken back to bleeding tissue that appeared viable. The posterior wound involving the Achilles tendon was the largest wound at about 7-8 cm x cm and there was significant necrotic eschar. The other three wounds were on the mid calf more anteriorly. They were about 5 x 5 cm  for the largest one, and 5 x 4 cm and 3 x 1.5 cm for the two smaller wounds. The debridement was performed with a scalpel and then the back side of the Bovie pad and encompassed an area of approximately 90 cm2.  The wound was irrigated copiously with pulse lavage saline.  After all clearly non-viable tissue was removed, a three layer UNNA boot was placed on the left leg to dress all the wounds. The patient was then awakened from anesthesia and taken to the recovery room in stable condition having tolerated the procedure well.  COMPLICATIONS: none  CONDITION: stable  Leotis Pain  10/22/2018, 2:09 PM   This note was created with Dragon Medical transcription system. Any errors in dictation are purely unintentional.

## 2018-10-22 NOTE — Progress Notes (Signed)
Patient remained lethargic, but was able to open her eyes,maintain brief eye contact , response to pain and voice. Blood glucose was WDL. Dr. Jannifer Franklin was notified of patient's status. Head CT and ABG were completed per order, and AM labs were drawn early.  Dr. Marcille Blanco notified potassium of 3.2. No new order received .

## 2018-10-22 NOTE — H&P (Signed)
[facility] History & Physical Update  The patient was interviewed and re-examined.  The patient's previous History and Physical has been reviewed and is unchanged.  There is no change in the plan of care. We plan to proceed with the scheduled procedure.  Leotis Pain, MD  10/22/2018, 1:24 PM

## 2018-10-22 NOTE — Progress Notes (Signed)
PT Cancellation Note  Patient Details Name: Julia Gordon MRN: [id number] DOB: 26-Jun-1934   Cancelled Treatment:    Reason Eval/Treat Not Completed: Other (comment)(Per RN, pt with significant pain in LLE today, underwent angiogram yesterday. Scheduled for debridement later today. PT to follow up as able.)   Lieutenant Diego PT, DPT [phone] AM,10/22/18 [phone]

## 2018-10-22 NOTE — Transfer of Care (Signed)
Immediate Anesthesia Transfer of Care Note  Patient: Julia Gordon  Procedure(s) Performed: DEBRIDEMENT WOUND (Left Leg Lower)  Patient Location: PACU  Anesthesia Type:General  Level of Consciousness: awake and alert   Airway & Oxygen Therapy: Patient Spontanous Breathing and Patient connected to face mask oxygen  Post-op Assessment: Report given to RN and Post -op Vital signs reviewed and stable  Post vital signs: Reviewed and stable  Last Vitals:  Vitals Value Taken Time  BP 110/88   Temp 97.2   Pulse 99   Resp 16   SpO2 98     Last Pain:  Vitals:   10/22/18 1224  TempSrc: Tympanic  PainSc:       Patients Stated Pain Goal: 0 ([id number])  Complications: No apparent anesthesia complications

## 2018-10-23 LAB — GLUCOSE, RANDOM: Glucose, Bld: 115 mg/dL — ABNORMAL HIGH (ref 70–99)

## 2018-10-23 LAB — SARS CORONAVIRUS 2 (TAT 6-24 HRS): SARS Coronavirus 2: NEGATIVE

## 2018-10-23 MED ORDER — FUROSEMIDE 40 MG PO TABS
40.0000 mg | ORAL_TABLET | Freq: Two times a day (BID) | ORAL | Status: DC
  Administered 2018-10-23 – 2018-10-26 (×6): 40 mg via ORAL
  Filled 2018-10-23 (×6): qty 1

## 2018-10-23 NOTE — Progress Notes (Signed)
[place] at [place] NAME: Julia Gordon    MR#:  [id number]  DATE OF BIRTH:  1934/10/04  SUBJECTIVE:   Status post Left lower extremity angiogram and also debridement of left foot ulcers.  Patient still complaining of some pain in that left leg.  No other acute events overnight.  REVIEW OF SYSTEMS:    Review of Systems  Unable to perform ROS: Mental acuity    Nutrition: Heart Healthy Tolerating Diet: Yes Tolerating PT: Await Eval.   DRUG ALLERGIES:   Allergies  Allergen Reactions   No Known Allergies     VITALS:  Blood pressure (!) 110/98, pulse (!) 109, temperature 98.4 F (36.9 C), temperature source Oral, resp. rate 18, height 5\' 2"  (1.575 m), weight 77.7 kg, SpO2 96 %.  PHYSICAL EXAMINATION:   Physical Exam  GENERAL:  84 y.o.-year-old patient lying in bed in no acute distress.  EYES: Pupils equal, round, reactive to light and accommodation. No scleral icterus. Extraocular muscles intact.  HEENT: Head atraumatic, normocephalic. Oropharynx and nasopharynx clear.  NECK:  Supple, no jugular venous distention. No thyroid enlargement, no tenderness.  LUNGS: Normal breath sounds bilaterally, no wheezing, rales, rhonchi. No use of accessory muscles of respiration.  CARDIOVASCULAR: S1, S2 normal. No murmurs, rubs, or gallops.  ABDOMEN: Soft, nontender, nondistended. Bowel sounds present. No organomegaly or mass.  EXTREMITIES: No cyanosis, clubbing or edema b/l.   Lower extremity wrapped in Kerlix from debridement yesterday. NEUROLOGIC: Cranial nerves II through XII are intact. No focal Motor or sensory deficits b/l.   PSYCHIATRIC: The patient is alert and oriented x 1.  SKIN: No obvious rash, lesion, or ulcer.    LABORATORY PANEL:   CBC Recent Labs  Lab 10/22/18 0146  WBC 7.3  HGB 11.8*  HCT 36.9  PLT 195    ------------------------------------------------------------------------------------------------------------------  Chemistries  Recent Labs  Lab 10/22/18 0146  10/23/18 0802  NA 139  --   --   K 3.2*  --   --   CL 105  --   --   CO2 30  --   --   GLUCOSE 117*   < > 115*  BUN 30*  --   --   CREATININE 0.96  --   --   CALCIUM 8.5*  --   --   MG 2.1  --   --    < > = values in this interval not displayed.   ------------------------------------------------------------------------------------------------------------------  Cardiac Enzymes No results for input(s): TROPONINI in the last 168 hours. ------------------------------------------------------------------------------------------------------------------  RADIOLOGY:  Ct Head Wo Contrast  Result Date: 10/22/2018 CLINICAL DATA:  84 year old female with lethargy, altered mental status. EXAM: CT HEAD WITHOUT CONTRAST TECHNIQUE: Contiguous axial images were obtained from the base of the skull through the vertex without intravenous contrast. COMPARISON:  Head CT 10/01/2018. FINDINGS: Brain: There has been a vault encephalomalacia since 10/01/2018 at the left inferior frontal gyrus infarct (left MCA, anterior division). No associated hemorrhage or mass effect. Elsewhere gray-white matter differentiation appears stable. No midline shift, ventriculomegaly, mass effect, evidence of mass lesion, intracranial hemorrhage or new cortically based acute infarction. Vascular: Calcified atherosclerosis at the skull base. No suspicious intracranial vascular hyperdensity. Skull: No acute osseous abnormality identified. Sinuses/Orbits: Visualized paranasal sinuses and mastoids are stable and well pneumatized. Other: No acute orbit or scalp soft tissue finding. IMPRESSION: 1. The left MCA anterior division territory infarct shows some evolution since 10/01/2018, but no associated hemorrhage or mass effect.  2.  No new intracranial abnormality.  Electronically Signed   By: Genevie Ann M.D.   On: 10/22/2018 02:25     ASSESSMENT AND PLAN:   84 year old female with past medical history of hypertension, hyperlipidemia, nonischemic cardiomyopathy, atrial fibrillation, history of colon cancer, breast cancer, COPD who presented to the hospital due to ulcers of the left lower extremity.  1.  Peripheral vascular disease status post bilateral leg ulcers-patient had worsening left lower extremity ulcers which were chronic. -Seen by vascular surgery and left lower angiogram done which showed no significant stenosis and they thought this was more related to poor flow from patient's severe cardiomyopathy. -Continue local wound care, status post debridement of the left lower extremity ulcers postop day #1 today.  2.  Acute on chronic systolic congestive heart failure-patient has underlying EF of 20 to 25%. -Clinically does not appear to be significantly volume overloaded today.  cont. Toprol, Lasix, Entresto.   3. Essential HTN - cont. Toprol.   4. COPD - no acute exacerbation.  - cont. Duonebs , Albuterol nebs as needed.   5.  History of atrial fibrillation-rate controlled.  Continue Toprol. -Continue Xarelto.   All the records are reviewed and case discussed with Care Management/Social Worker. Management plans discussed with the patient, family and they are in agreement.  CODE STATUS: Partial Code  DVT Prophylaxis: Xarelto  TOTAL TIME TAKING CARE OF THIS PATIENT: 30 minutes.   POSSIBLE D/C IN 2-3 DAYS, DEPENDING ON CLINICAL CONDITION.   Henreitta Leber M.D on 10/23/2018 at 2:21 PM  Between 7am to 6pm - Pager - [phone]  After 6pm go to [email or web address] - Proofreader  [facility]  Office  [phone]  CC: Primary care physician; Ellamae Sia, MD

## 2018-10-23 NOTE — Progress Notes (Signed)
1 Day Post-Op   Subjective/Chief Complaint: Complains of some discomfort in LEFT leg   Objective: Vital signs in last 24 hours: Temp:  [97 F (36.1 C)-98.4 F (36.9 C)] 98.4 F (36.9 C) (10/17 0820) Pulse Rate:  [51-118] 109 (10/17 0820) Resp:  [12-21] 18 (10/17 0440) BP: (106-126)/(55-98) 110/98 (10/17 0820) SpO2:  [91 %-98 %] 96 % (10/17 0820) Weight:  [77.7 kg] 77.7 kg (10/17 0440) Last BM Date: 10/21/18  Intake/Output from previous day: 10/16 0701 - 10/17 0700 In: 740 [P.O.:240; I.V.:500] Out: 460 [Urine:450; Blood:10] Intake/Output this shift: No intake/output data recorded.  General appearance: cooperative and no distress Extremities: UNNA boot in place on left leg- clean. Toes warm.  Lab Results:  Recent Labs    10/22/18 0146  WBC 7.3  HGB 11.8*  HCT 36.9  PLT 195   BMET Recent Labs    10/21/18 0434  10/22/18 0146 10/22/18 1713 10/23/18 0802  NA 141  --  139  --   --   K 3.5  --  3.2*  --   --   CL 100  --  105  --   --   CO2 27  --  30  --   --   GLUCOSE 89   < > 117* 102* 115*  BUN 31*  --  30*  --   --   CREATININE 1.08*  --  0.96  --   --   CALCIUM 9.1  --  8.5*  --   --    < > = values in this interval not displayed.   PT/INR Recent Labs    10/22/18 0146  LABPROT 23.4*  INR 2.1*   ABG Recent Labs    10/22/18 0146  PHART 7.40  HCO3 30.4*    Studies/Results: Ct Head Wo Contrast  Result Date: 10/22/2018 CLINICAL DATA:  84 year old female with lethargy, altered mental status. EXAM: CT HEAD WITHOUT CONTRAST TECHNIQUE: Contiguous axial images were obtained from the base of the skull through the vertex without intravenous contrast. COMPARISON:  Head CT 10/01/2018. FINDINGS: Brain: There has been a vault encephalomalacia since 10/01/2018 at the left inferior frontal gyrus infarct (left MCA, anterior division). No associated hemorrhage or mass effect. Elsewhere gray-white matter differentiation appears stable. No midline shift,  ventriculomegaly, mass effect, evidence of mass lesion, intracranial hemorrhage or new cortically based acute infarction. Vascular: Calcified atherosclerosis at the skull base. No suspicious intracranial vascular hyperdensity. Skull: No acute osseous abnormality identified. Sinuses/Orbits: Visualized paranasal sinuses and mastoids are stable and well pneumatized. Other: No acute orbit or scalp soft tissue finding. IMPRESSION: 1. The left MCA anterior division territory infarct shows some evolution since 10/01/2018, but no associated hemorrhage or mass effect. 2.  No new intracranial abnormality. Electronically Signed   By: Genevie Ann M.D.   On: 10/22/2018 02:25    Anti-infectives: Anti-infectives (From admission, onward)   Start     Dose/Rate Route Frequency Ordered Stop   10/22/18 1200  ceFAZolin (ANCEF) IVPB 2g/100 mL premix    Note to Pharmacy: To be given in the OR   2 g 200 mL/hr over 30 Minutes Intravenous On call 10/21/18 1537 10/23/18 1200   10/21/18 0800  ceFAZolin (ANCEF) IVPB 1 g/50 mL premix    Note to Pharmacy: To be given in specials   1 g 100 mL/hr over 30 Minutes Intravenous  Once 10/21/18 0751 10/21/18 1247      Assessment/Plan: s/p Procedure(s): DEBRIDEMENT WOUND (Left) Continue UNNA boot change per Wound  care.  Further care per Medicine/ Palliative.  LOS: 10 days    Jamesetta So A 10/23/2018

## 2018-10-23 NOTE — Plan of Care (Signed)
  Problem: Elimination: Goal: Will not experience complications related to bowel motility Outcome: Progressing   Problem: Safety: Goal: Ability to remain free from injury will improve Outcome: Progressing   Problem: Elimination: Goal: Will not experience complications related to bowel motility Outcome: Progressing   Problem: Safety: Goal: Ability to remain free from injury will improve Outcome: Progressing

## 2018-10-23 NOTE — Progress Notes (Addendum)
Port cath change was due 10/22/2018 but was not done. IV team consulted and sent a message. Will continue to monitor.  Update 0019: Pt Port cath needle was change by IV team. Will notify incoming shift. Will continue to monitor.

## 2018-10-24 LAB — BASIC METABOLIC PANEL
Anion gap: 9 (ref 5–15)
BUN: 29 mg/dL — ABNORMAL HIGH (ref 8–23)
CO2: 28 mmol/L (ref 22–32)
Calcium: 8.6 mg/dL — ABNORMAL LOW (ref 8.9–10.3)
Chloride: 106 mmol/L (ref 98–111)
Creatinine, Ser: 0.95 mg/dL (ref 0.44–1.00)
GFR calc Af Amer: >60 mL/min (ref >60)
GFR calc non Af Amer: 56 mL/min — ABNORMAL LOW (ref >60)
Glucose, Bld: 108 mg/dL — ABNORMAL HIGH (ref 70–99)
Potassium: 3.1 mmol/L — ABNORMAL LOW (ref 3.5–5.1)
Sodium: 143 mmol/L (ref 135–145)

## 2018-10-24 LAB — GLUCOSE, CAPILLARY: Glucose-Capillary: 108 mg/dL — ABNORMAL HIGH (ref 70–99)

## 2018-10-24 MED ORDER — RIVAROXABAN 20 MG PO TABS
20.0000 mg | ORAL_TABLET | Freq: Every day | ORAL | Status: DC
  Administered 2018-10-24 – 2018-10-25 (×2): 20 mg via ORAL
  Filled 2018-10-24 (×3): qty 1

## 2018-10-24 MED ORDER — POTASSIUM CHLORIDE CRYS ER 20 MEQ PO TBCR
40.0000 meq | EXTENDED_RELEASE_TABLET | Freq: Once | ORAL | Status: AC
  Administered 2018-10-24: 16:00:00 40 meq via ORAL
  Filled 2018-10-24: qty 2

## 2018-10-24 NOTE — Progress Notes (Signed)
   10/24/18 1700  Clinical Encounter Type  Visited With Patient and family together  Visit Type Follow-up  Referral From Nurse  Ch received a call requesting a completion of AD. Ch found two witnesses and a notary and assisted in finalizing the document. The notarized copy can be found in the pt chart.

## 2018-10-24 NOTE — Progress Notes (Signed)
  Pt was trying to get up and wanting to get up. Hard to re-direct. Haliperidol given and OTC as per CCMD 440. Will continue to monitor.

## 2018-10-24 NOTE — Progress Notes (Signed)
Brief Pharmacy Note  Rivaroxaban adjusted to 20 mg daily for CrCl > 50 ml/min using patient's total body weight. Will continue to monitor renal function for any necessary adjustments.  Dorena Bodo, PharmD

## 2018-10-24 NOTE — Anesthesia Postprocedure Evaluation (Signed)
Anesthesia Post Note  Patient: Julia Gordon  Procedure(s) Performed: DEBRIDEMENT WOUND (Left Leg Lower)  Patient location during evaluation: PACU Anesthesia Type: General Level of consciousness: awake and alert Pain management: pain level controlled Vital Signs Assessment: post-procedure vital signs reviewed and stable Respiratory status: spontaneous breathing, nonlabored ventilation, respiratory function stable and patient connected to nasal cannula oxygen Cardiovascular status: blood pressure returned to baseline and stable Postop Assessment: no apparent nausea or vomiting Anesthetic complications: no     Last Vitals:  Vitals:   10/24/18 0048 10/24/18 0458  BP: 117/89 100/84  Pulse: (!) 103 94  Resp:  18  Temp:  36.8 C  SpO2:      Last Pain:  Vitals:   10/24/18 0656  TempSrc:   PainSc: Asleep                 Precious Haws Piscitello

## 2018-10-24 NOTE — Progress Notes (Signed)
Physical Therapy Treatment Patient Details Name: Julia Gordon MRN: [id number] DOB: 07/04/1934 Today's Date: 10/24/2018    History of Present Illness Julia Gordon a 84 y.o. female with a history of breast cancer, colon cancer, history of DVT comes from [place] because of shortness of breath, edema of extremities.    PT Comments    Patient performed bed mobility with mod assist supine to sit and sit to supine. Following bed mobility patients O2 saturation was 58 % and then increased to 69%, then 82 %. Call bell was pushed and asked for the nurse to be called due to low O2 saturation;  after several minutes of waiting, the nurse was called on ascom and asked to call a rapid response due to continued decreased O2 saturation below 90%. About the time rapid response arrived it was discovered that the patient did not have O2 tubing connected to the wall O2.  Patient was left with rapid response team and nursing. Patient needs mod assist for mobility and will benefit from skilled PT to improve strength and mobility.   Follow Up Recommendations  SNF     Equipment Recommendations       Recommendations for Other Services       Precautions / Restrictions Restrictions Weight Bearing Restrictions: No    Mobility  Bed Mobility Overal bed mobility: Needs Assistance Bed Mobility: Supine to Sit;Sit to Supine Rolling: Min assist   Supine to sit: Mod assist Sit to supine: Mod assist   General bed mobility comments: needs assist for LLE  Transfers Overall transfer level: (unable)                  Ambulation/Gait Ambulation/Gait assistance: (unable)               Stairs             Wheelchair Mobility    Modified Rankin (Stroke Patients Only)       Balance Overall balance assessment: Mild deficits observed, not formally tested Sitting-balance support: Single extremity supported Sitting balance-Leahy Scale: Good                                       Cognition Arousal/Alertness: Awake/alert Behavior During Therapy: WFL for tasks assessed/performed Overall Cognitive Status: History of cognitive impairments - at baseline                                 General Comments: She tells therapist she lives in a house near [address]      Exercises      General Comments        Pertinent Vitals/Pain Pain Assessment: 0-10 Pain Score: 5  Pain Location: (left leg) Pain Descriptors / Indicators: Aching Pain Intervention(s): Limited activity within patient's tolerance;Monitored during session    Home Living                      Prior Function            PT Goals (current goals can now be found in the care plan section) Acute Rehab PT Goals Time For Goal Achievement: 11/07/18 Potential to Achieve Goals: Fair Progress towards PT goals: Progressing toward goals    Frequency    Min 2X/week      PT Plan Current plan remains appropriate  Co-evaluation              AM-PAC PT "6 Clicks" Mobility   Outcome Measure  Help needed turning from your back to your side while in a flat bed without using bedrails?: A Little Help needed moving from lying on your back to sitting on the side of a flat bed without using bedrails?: A Lot Help needed moving to and from a bed to a chair (including a wheelchair)?: None Help needed standing up from a chair using your arms (e.g., wheelchair or bedside chair)?: None Help needed to walk in hospital room?: None Help needed climbing 3-5 steps with a railing? : None 6 Click Score: 21    End of Session Equipment Utilized During Treatment: Gait belt Activity Tolerance: Other (comment) Patient left: (rapid response team called due to O2 saturation drop)   PT Visit Diagnosis: Muscle weakness (generalized) (M62.81) Pain - Right/Left: Left Pain - part of body: Leg     Time: 1255-1320 PT Time Calculation (min) (ACUTE ONLY): 25 min  Charges:  $Therapeutic  Activity: 23-37 mins                        Alanson Puls, PT DPT 10/24/2018, 2:09 PM

## 2018-10-24 NOTE — Progress Notes (Signed)
[facility] [place] NAME: Alabama Llewelyn    MR#:  [id number]  DATE OF BIRTH:  [birth year]  SUBJECTIVE:   Status post Left lower extremity angiogram and also debridement of left foot ulcers POD # 2.  Patient still complaining of some pain in that left leg.  No other acute events overnight.  REVIEW OF SYSTEMS:    Review of Systems  Unable to perform ROS: Mental acuity    Nutrition: Heart Healthy Tolerating Diet: Yes Tolerating PT: Await Eval.   DRUG ALLERGIES:   Allergies  Allergen Reactions  . No Known Allergies     VITALS:  Blood pressure 118/87, pulse 90, temperature 98.3 F (36.8 C), temperature source Oral, resp. rate 18, height 5\' 2"  (1.575 m), weight 77.2 kg, SpO2 97 %.  PHYSICAL EXAMINATION:   Physical Exam  GENERAL:  84 y.o.-year-old patient lying in bed in no acute distress.  EYES: Pupils equal, round, reactive to light and accommodation. No scleral icterus. Extraocular muscles intact.  HEENT: Head atraumatic, normocephalic. Oropharynx and nasopharynx clear.  NECK:  Supple, no jugular venous distention. No thyroid enlargement, no tenderness.  LUNGS: Normal breath sounds bilaterally, no wheezing, rales, rhonchi. No use of accessory muscles of respiration.  CARDIOVASCULAR: S1, S2 normal. No murmurs, rubs, or gallops.  ABDOMEN: Soft, nontender, nondistended. Bowel sounds present. No organomegaly or mass.  EXTREMITIES: No cyanosis, clubbing or edema b/l.   Lower extremity wrapped in Kerlix from debridement.  NEUROLOGIC: Cranial nerves II through XII are intact. No focal Motor or sensory deficits b/l.   PSYCHIATRIC: The patient is alert and oriented x 1.  SKIN: No obvious rash, lesion, or ulcer.    LABORATORY PANEL:   CBC Recent Labs  Lab 10/22/18 0146  WBC 7.3  HGB 11.8*  HCT 36.9  PLT 195   ------------------------------------------------------------------------------------------------------------------   Chemistries  Recent Labs  Lab 10/22/18 0146  10/24/18 0531  NA 139  --  143  K 3.2*  --  3.1*  CL 105  --  106  CO2 30  --  28  GLUCOSE 117*   < > 108*  BUN 30*  --  29*  CREATININE 0.96  --  0.95  CALCIUM 8.5*  --  8.6*  MG 2.1  --   --    < > = values in this interval not displayed.   ------------------------------------------------------------------------------------------------------------------  Cardiac Enzymes No results for input(s): TROPONINI in the last 168 hours. ------------------------------------------------------------------------------------------------------------------  RADIOLOGY:  No results found.   ASSESSMENT AND PLAN:   84 year old female with past medical history of hypertension, hyperlipidemia, nonischemic cardiomyopathy, atrial fibrillation, history of colon cancer, breast cancer, COPD who presented to the hospital due to ulcers of the left lower extremity.  1.  Peripheral vascular disease status post bilateral leg ulcers-patient had worsening left lower extremity ulcers which were chronic. -Seen by vascular surgery and left lower angiogram done which showed no significant stenosis and they thought this was more related to poor flow from patient's severe cardiomyopathy. -Continue local wound care, status post debridement of the left lower extremity ulcers postop day #2 today.  2.  Acute on chronic systolic congestive heart failure-patient has underlying EF of 20 to 25%. -Clinically does not appear to be significantly volume overloaded today.  cont. Toprol, Lasix, Entresto.   3. Essential HTN - cont. Toprol.   4. COPD - no acute exacerbation.  - cont. Duonebs , Albuterol nebs as needed.   5.  History of atrial fibrillation-rate controlled.  Continue Toprol. -Continue Xarelto.  6. Hypokalemia - will replace orally and repeat in a.m.    All the records are reviewed and case discussed with Care Management/Social Worker. Management plans discussed  with the patient, family and they are in agreement.  CODE STATUS: Partial Code  DVT Prophylaxis: Xarelto  TOTAL TIME TAKING CARE OF THIS PATIENT: 30 minutes.   POSSIBLE D/C IN 2-3 DAYS, DEPENDING ON CLINICAL CONDITION.   Henreitta Leber M.D on 10/24/2018 at 1:53 PM  Between 7am to 6pm - Pager - [phone]  After 6pm go to [email or web address] - Proofreader  [facility]  Office  [phone]  CC: Primary care physician; Ellamae Sia, MD

## 2018-10-25 ENCOUNTER — Encounter: Payer: Self-pay | Admitting: Vascular Surgery

## 2018-10-25 LAB — GLUCOSE, CAPILLARY
Glucose-Capillary: 78 mg/dL (ref 70–99)
Glucose-Capillary: 47 mg/dL — ABNORMAL LOW (ref 70–99)
Glucose-Capillary: 66 mg/dL — ABNORMAL LOW (ref 70–99)
Glucose-Capillary: 97 mg/dL (ref 70–99)

## 2018-10-25 LAB — POTASSIUM: Potassium: 3.4 mmol/L — ABNORMAL LOW (ref 3.5–5.1)

## 2018-10-25 MED ORDER — TRAMADOL HCL 50 MG PO TABS: 50.0000 mg | ORAL_TABLET | Freq: Four times a day (QID) | ORAL | 0 refills | Status: AC | PRN

## 2018-10-25 MED ORDER — DEXTROSE 50 % IV SOLN: 1.0000 | INTRAVENOUS | Status: DC

## 2018-10-25 MED ORDER — DEXTROSE 50 % IV SOLN
INTRAVENOUS | Status: AC
  Administered 2018-10-25: 50 mL
  Filled 2018-10-25: qty 50

## 2018-10-25 NOTE — Discharge Summary (Addendum)
[facility] NAME: Julia Gordon    MR#:  [id number]  DATE OF BIRTH:  Oct 28, 1934  DATE OF ADMISSION:  10/13/2018 ADMITTING PHYSICIAN: Epifanio Lesches, MD  DATE OF DISCHARGE: 10/26/2018  PRIMARY CARE PHYSICIAN: Ellamae Sia, MD    ADMISSION DIAGNOSIS:  Acute on chronic congestive heart failure, unspecified heart failure type ([place]) [I50.9]  DISCHARGE DIAGNOSIS:  Active Problems:   Acute on chronic systolic heart failure (HCC)   Acute on chronic congestive heart failure (HCC)   Palliative care encounter   Acute exacerbation of CHF (congestive heart failure) ([place])   SECONDARY DIAGNOSIS:   Past Medical History:  Diagnosis Date  . Anemia   . Breast cancer ([place]) 2015   a. L mastectomy with chemoradiation  . Colon adenocarcinoma ([place])   . COPD (chronic obstructive pulmonary disease) ([place])   . Depression   . DJD (degenerative joint disease) of knee    right knee  . DVT (deep venous thrombosis) ([place])    a. 06/2018 L popliteal and R peroneal DVTs-->Xarelto.  . H/O hysterectomy with oophorectomy    for DUB  . HFrEF (heart failure with reduced ejection fraction) ([place])    a. 06/2018 Echo: EF 25-30%.  . History of depression   . HLD (hyperlipidemia)   . HTN (hypertension)   . LV (left ventricular) mural thrombus    history of, resolved (echo 04/09)  . NICM (nonischemic cardiomyopathy) ([place])    a. Reports prior h/o cath @ [facility]; b. status post [place] with [employer]ant lead in 2009 at [facility], Dr. Boyd Kerbs; c. 2014 - prev seen by G. Lovena Le, MD; c. 06/2018 Echo: EF 25-30%, DD. Diff HK. RVSP 61mHg. Mildly dil RA. Mild to mod MR. Mod TR. Mild AI.  .Marland KitchenPersistent atrial fibrillation ([place]    [place] b. CHADS2VASc 6 (CHF, HTN, age x 2, vascular disease, female)-->eliquis added 06/2018 in setting of LLE DVT.  . Valvular heart disease    a. 06/2018 Echo: Mod TR, mild to mod MR, mild AI.    HOSPITAL  COURSE:   84year old female with past medical history of hypertension, hyperlipidemia, nonischemic cardiomyopathy, atrial fibrillation, history of colon cancer, breast cancer, COPD who presented to the hospital due to ulcers of the left lower extremity.  1.  Peripheral vascular disease status post bilateral leg ulcers-patient had worsening left lower extremity ulcers which were chronic. -Vascular surgery was consulted.  They recommended repeating the left lower extremity angiogram.  Patient's angiogram did not show any evidence of significant stenosis and therefore most of patient's symptoms were related to poor flow given her severe cardiomyopathy. -Vascular surgery did recommend doing some debridement of those ulcers which was done.  Patient is postop day #3 since getting debridement of those ulcers done. -As per wound care and vascular surgery will continue local wound care and she has Unna boots on and the dressing changes are to be done weekly.  ([place] perform) Wound care to left LE with 4 ulcerations on anterior LE and 2 ulcerations on posterior LE:  Cleanse with soap and water, rinse with NS and pat dry. Cover ulcers with xeroform gauze (Kellie Simmering# [id number]), apply layers 1, 2 and 4 of Profore bandaging kit (Kellie Simmering# [phone]. Place feet into Prevalon Boots per protocol.  2.  Acute on chronic systolic congestive heart failure-patient has underlying EF of 20 to 25%. -Patient was diuresed with her oral Lasix and  maintained on her Toprol and Entresto.  She is clinically euvolemic and stable.  She is therefore on her maintenance meds as shown below.  3. Essential HTN - pt. Will cont. Toprol.   4. COPD - no acute exacerbation.  - cont. Duonebs , Albuterol nebs as needed.   5.  History of atrial fibrillation-rate controlled.  Continue Toprol. -Continue Xarelto.  6. Hypokalemia - improved and resolved with supplementation.    7. Hx of Breast Cancer/Colon Cancer - cont. Outpatient follow  up with Oncology.   Given pt's multiple Comorbid conditions, pt. Would benefit from Palliative to follow at the SNF.   DISCHARGE CONDITIONS:   Stable.   CONSULTS OBTAINED:    DRUG ALLERGIES:   Allergies  Allergen Reactions  . No Known Allergies     DISCHARGE MEDICATIONS:   Allergies as of 10/25/2018      Reactions   No Known Allergies       Medication List    TAKE these medications   acetaminophen 325 MG tablet Commonly known as: TYLENOL Take 2 tablets (650 mg total) by mouth every 6 (six) hours as needed for mild pain (or temp > 37.5 C (99.5 F)).   alendronate 70 MG tablet Commonly known as: FOSAMAX TAKE 1 TAB WEEKLY 30 MINS BEFORE BREAKFAST WITH A FULL GLASS OF WATER. DO NOT LIE DOWN FOR 30 MINS What changed: See the new instructions.   aspirin 81 MG EC tablet Take 1 tablet (81 mg total) by mouth daily.   atorvastatin 40 MG tablet Commonly known as: LIPITOR Take 1 tablet (40 mg total) by mouth daily at 6 PM.   furosemide 20 MG tablet Commonly known as: LASIX Take 40 mg (2 tablets) by mouth every morning, take 1 tablet (20 mg total) once in the afternoon.   isosorbide mononitrate 30 MG 24 hr tablet Commonly known as: IMDUR Take 30 mg by mouth daily.   metoprolol succinate 50 MG 24 hr tablet Commonly known as: TOPROL-XL Take 1 tablet (50 mg total) by mouth daily.   potassium chloride SA 20 MEQ tablet Commonly known as: KLOR-CON Take 1 tablet (20 mEq total) by mouth daily.   Rivaroxaban 15 MG Tabs tablet Commonly known as: XARELTO Take 1 tablet (15 mg total) by mouth daily with supper.   sacubitril-valsartan 24-26 MG Commonly known as: ENTRESTO Take 1 tablet by mouth 2 (two) times daily.   senna-docusate 8.6-50 MG tablet Commonly known as: Senokot-S Take 1 tablet by mouth at bedtime as needed for mild constipation or moderate constipation.   traMADol 50 MG tablet Commonly known as: Ultram Take 1 tablet (50 mg total) by mouth every 6 (six) hours  as needed.         DISCHARGE INSTRUCTIONS:   DIET:  Cardiac diet  DISCHARGE CONDITION:  Stable  ACTIVITY:  Activity as tolerated  OXYGEN:  Home Oxygen: No.   Oxygen Delivery: room air  DISCHARGE LOCATION:  nursing home   If you experience worsening of your admission symptoms, develop shortness of breath, life threatening emergency, suicidal or homicidal thoughts you must seek medical attention immediately by calling 911 or calling your MD immediately  if symptoms less severe.  You Must read complete instructions/literature along with all the possible adverse reactions/side effects for all the Medicines you take and that have been prescribed to you. Take any new Medicines after you have completely understood and accpet all the possible adverse reactions/side effects.   Please note  You were cared for by  a hospitalist during your hospital stay. If you have any questions about your discharge medications or the care you received while you were in the hospital after you are discharged, you can call the unit and asked to speak with the hospitalist on call if the hospitalist that took care of you is not available. Once you are discharged, your primary care physician will handle any further medical issues. Please note that NO REFILLS for any discharge medications will be authorized once you are discharged, as it is imperative that you return to your primary care physician (or establish a relationship with a primary care physician if you do not have one) for your aftercare needs so that they can reassess your need for medications and monitor your lab values.     Today   No acute events overnight, patient had her dressing change done today.  No shortness of breath, chest pain.  Awaiting insurance authorization for discharge to short-term rehab.  VITAL SIGNS:  Blood pressure 98/79, pulse 86, temperature 98.3 F (36.8 C), resp. rate 19, height 5' 2"  (1.575 m), weight 77.2 kg, SpO2 97  %.  I/O:    Intake/Output Summary (Last 24 hours) at 10/25/2018 1126 Last data filed at 10/24/2018 2322 Gross per 24 hour  Intake -  Output 750 ml  Net -750 ml    PHYSICAL EXAMINATION:   GENERAL:  84 y.o.-year-old patient lying in bed in no acute distress.  EYES: Pupils equal, round, reactive to light and accommodation. No scleral icterus. Extraocular muscles intact.  HEENT: Head atraumatic, normocephalic. Oropharynx and nasopharynx clear.  NECK:  Supple, no jugular venous distention. No thyroid enlargement, no tenderness.  LUNGS: Normal breath sounds bilaterally, no wheezing, rales, rhonchi. No use of accessory muscles of respiration.  CARDIOVASCULAR: S1, S2 normal. No murmurs, rubs, or gallops.  ABDOMEN: Soft, nontender, nondistended. Bowel sounds present. No organomegaly or mass.  EXTREMITIES: No cyanosis, clubbing or edema b/l.  Lower extremity wrapped in Kerlix from debridement.  NEUROLOGIC: Cranial nerves II through XII are intact. No focal Motor or sensory deficits b/l.   PSYCHIATRIC: The patient is alert and oriented x 3.  SKIN: No obvious rash, lesion, or ulcer.   DATA REVIEW:   CBC Recent Labs  Lab 10/22/18 0146  WBC 7.3  HGB 11.8*  HCT 36.9  PLT 195    Chemistries  Recent Labs  Lab 10/22/18 0146  10/24/18 0531 10/25/18 0450  NA 139  --  143  --   K 3.2*  --  3.1* 3.4*  CL 105  --  106  --   CO2 30  --  28  --   GLUCOSE 117*   < > 108*  --   BUN 30*  --  29*  --   CREATININE 0.96  --  0.95  --   CALCIUM 8.5*  --  8.6*  --   MG 2.1  --   --   --    < > = values in this interval not displayed.    Cardiac Enzymes No results for input(s): TROPONINI in the last 168 hours.  Microbiology Results  Results for orders placed or performed during the hospital encounter of 10/13/18  SARS Coronavirus 2 [facility] order, Performed in [facility] hospital lab) Nasopharyngeal Nasopharyngeal Swab     Status: None   Collection Time: 10/13/18  6:16 PM   Specimen:  Nasopharyngeal Swab  Result Value Ref Range Status   SARS Coronavirus 2 NEGATIVE NEGATIVE Final  Comment: (NOTE) If result is NEGATIVE SARS-CoV-2 target nucleic acids are NOT DETECTED. The SARS-CoV-2 RNA is generally detectable in upper and lower  respiratory specimens during the acute phase of infection. The lowest  concentration of SARS-CoV-2 viral copies this assay can detect is 250  copies / mL. A negative result does not preclude SARS-CoV-2 infection  and should not be used as the sole basis for treatment or other  patient management decisions.  A negative result may occur with  improper specimen collection / handling, submission of specimen other  than nasopharyngeal swab, presence of viral mutation(s) within the  areas targeted by this assay, and inadequate number of viral copies  (<250 copies / mL). A negative result must be combined with clinical  observations, patient history, and epidemiological information. If result is POSITIVE SARS-CoV-2 target nucleic acids are DETECTED. The SARS-CoV-2 RNA is generally detectable in upper and lower  respiratory specimens dur ing the acute phase of infection.  Positive  results are indicative of active infection with SARS-CoV-2.  Clinical  correlation with patient history and other diagnostic information is  necessary to determine patient infection status.  Positive results do  not rule out bacterial infection or co-infection with other viruses. If result is PRESUMPTIVE POSTIVE SARS-CoV-2 nucleic acids MAY BE PRESENT.   A presumptive positive result was obtained on the submitted specimen  and confirmed on repeat testing.  While 2019 novel coronavirus  (SARS-CoV-2) nucleic acids may be present in the submitted sample  additional confirmatory testing may be necessary for epidemiological  and / or clinical management purposes  to differentiate between  SARS-CoV-2 and other Sarbecovirus currently known to infect humans.  If clinically  indicated additional testing with an alternate test  methodology [phone] is advised. The SARS-CoV-2 RNA is generally  detectable in upper and lower respiratory sp ecimens during the acute  phase of infection. The expected result is Negative. Fact Sheet for Patients:  StrictlyIdeas.no Fact Sheet for Healthcare Providers: BankingDealers.co.za This test is not yet approved or cleared by the [place] FDA and has been authorized for detection and/or diagnosis of SARS-CoV-2 by FDA under an Emergency Use Authorization (EUA).  This EUA will remain in effect (meaning this test can be used) for the duration of the COVID-19 declaration under Section 564(b)(1) of the Act, 21 U.S.C. section 360bbb-3(b)(1), unless the authorization is terminated or revoked sooner. Performed at [facility], [address] [place], [place] [address]   MRSA PCR Screening     Status: None   Collection Time: 10/14/18 12:43 PM   Specimen: Nasopharyngeal  Result Value Ref Range Status   MRSA by PCR NEGATIVE NEGATIVE Final    Comment:        The GeneXpert MRSA Assay (FDA approved for NASAL specimens only), is one component of a comprehensive MRSA colonization surveillance program. It is not intended to diagnose MRSA infection nor to guide or monitor treatment for MRSA infections. Performed at [facility], [address] [place], [place] [address]   Novel Coronavirus, NAA (hospital order; send-out to ref lab)     Status: None   Collection Time: 10/17/18  1:17 PM   Specimen: Nasopharyngeal Swab; Respiratory  Result Value Ref Range Status   SARS-CoV-2, NAA NOT DETECTED NOT DETECTED Final    Comment: (NOTE) This nucleic acid amplification test was developed and its performance characteristics determined by [employer], [employer] and [employer]. Nucleic acid amplification tests include PCR and TMA. This test has not been FDA cleared or approved. This  test  has been authorized by FDA under an Emergency Use Authorization (EUA). This test is only authorized for the duration of time the declaration that circumstances exist justifying the authorization of the emergency use of in vitro diagnostic tests for detection of SARS-CoV-2 virus and/or diagnosis of COVID-19 infection under section 564(b)(1) of the Act, 21 U.S.C. 882CMK-3(K) (1), unless the authorization is terminated or revoked sooner. When diagnostic testing is negative, the possibility of a false negative result should be considered in the context of a patient's recent exposures and the presence of clinical signs and symptoms consistent with COVID-19. An individual without symptoms of COVID- 19 and who is not shedding SARS-CoV-2 vi rus would expect to have a negative (not detected) result in this assay. Performed At: [facility] [place], [place] [id number] Rush Farmer MD PV:[id number]    [place]  Final    Comment: Performed at [facility], [place], [place] [address]  SARS CORONAVIRUS 2 (TAT 6-24 HRS) Nasopharyngeal Nasopharyngeal Swab     Status: None   Collection Time: 10/22/18 11:15 PM   Specimen: Nasopharyngeal Swab  Result Value Ref Range Status   SARS Coronavirus 2 NEGATIVE NEGATIVE Final    Comment: (NOTE) SARS-CoV-2 target nucleic acids are NOT DETECTED. The SARS-CoV-2 RNA is generally detectable in upper and lower respiratory specimens during the acute phase of infection. Negative results do not preclude SARS-CoV-2 infection, do not rule out co-infections with other pathogens, and should not be used as the sole basis for treatment or other patient management decisions. Negative results must be combined with clinical observations, patient history, and epidemiological information. The expected result is Negative. Fact Sheet for Patients: SugarRoll.be Fact Sheet for  Healthcare Providers: https://www.woods-mathews.com/ This test is not yet approved or cleared by the [place] FDA and  has been authorized for detection and/or diagnosis of SARS-CoV-2 by FDA under an Emergency Use Authorization (EUA). This EUA will remain  in effect (meaning this test can be used) for the duration of the COVID-19 declaration under Section 56 4(b)(1) of the Act, 21 U.S.C. section 360bbb-3(b)(1), unless the authorization is terminated or revoked sooner. Performed at [facility], [place] [address] [place], [place] [address]     RADIOLOGY:  No results found.    Management plans discussed with the patient, family and they are in agreement.  CODE STATUS:     Code Status Orders  (From admission, onward)         Start     Ordered   10/14/18 1209  Limited resuscitation (code)  Continuous    Question Answer Comment  In the event of cardiac or respiratory ARREST: Initiate Code Blue, Call Rapid Response Yes   In the event of cardiac or respiratory ARREST: Perform CPR Yes   In the event of cardiac or respiratory ARREST: Perform Intubation/Mechanical Ventilation No   In the event of cardiac or respiratory ARREST: Use NIPPV/BiPAp only if indicated Yes   In the event of cardiac or respiratory ARREST: Administer ACLS medications if indicated Yes   In the event of cardiac or respiratory ARREST: Perform Defibrillation or Cardioversion if indicated Yes      10/14/18 1209       TOTAL TIME TAKING CARE OF THIS PATIENT: 40 minutes.    Henreitta Leber M.D on 10/25/2018 at 11:26 AM  Between 7am to 6pm - Pager - [phone]  After 6pm go to [email or web address] - [place]  Physicians  Hospitalists  Office  [phone]  CC: Primary care physician; Ellamae Sia, MD

## 2018-10-25 NOTE — Plan of Care (Signed)
  Problem: Education: Goal: Knowledge of General Education information will improve Description: Including pain rating scale, medication(s)/side effects and non-pharmacologic comfort measures Outcome: Not Progressing Note: Patient confused, only oriented to self. To be d/c'd today, hopefully (pending insurance authorization). Will continue to monitor neurological status for the remainder of the shift. Wenda Low [facility]

## 2018-10-25 NOTE — Consult Note (Signed)
[facility] Nurse wound consult note Reason for Consult: Patient to have first post operative dressing changes today to LLE. Joined at bedside by Vascular PA Hezzie Bump. Her presence and assistance with today's assessment and dressing reapplication is appreciated. Patient debrided in OR on Friday. Wound type: PVD/PAD Pressure Injury POA: Yes/No/NA Measurement: Anterior LEs with red, moist wound beds (no exudate): 4 wounds, the two anterior (pretibial) are the most significant and measure approximately 4cm x 3.5cm x 0.4cm each. Posterior LE with 3 wounds, most significant measures approximately 4.8cm x 3.4cm x 0.4cm with 3cm portion of Achille's tendon evident. Wound bed red, moist with no fresh exudate Wound bed: As described above Drainage (amount, consistency, odor) As described above Periwound: intact, hairless Dressing procedure/placement/frequency: WOC Nurse to perform 3-layer bandaging system once weekly.  Profore dressing kit with 3rd layer removed applied today. Patient tolerated well, albeit initially swatting at provider's hands.  Quieted/comforted with reassurance that dressing change was nearly finished.   Vascular PA requests bilateral pressure redistribution heel boots and these are ordered today. Profore boots will prevent pressure injury to heel and correct medial rotation of left foot.  Protocol indicates boots are to be removed twice to three times daily.  Vascular surgery requests once weekly dressing changes while in house; Profore kit with 3rd layer removed is left at bedside for next dressing change (due Monday, 11/01/18).  It is labeled to send with patient if she is discharged prior to next dressing change.  [place] nursing team will follow, and will remain available to this patient, the nursing and medical teams.   Thanks, Maudie Flakes, MSN, RN, [place], Arther Abbott  Pager# [phone]

## 2018-10-25 NOTE — Progress Notes (Signed)
Applied bilateral Prevalon boots at this time, as ordered. Will continue to monitor. Wenda Low [facility]

## 2018-10-25 NOTE — TOC Initial Note (Signed)
Transition of Care [facility]) - Initial/Assessment Note    Patient Details  Name: Julia Gordon MRN: [id number] Date of Birth: Mar 08, 1934  Transition of Care [facility]) CM/SW Contact:    Ross Ludwig, LCSW Phone Number: 10/25/2018, 2:14 PM  Clinical Narrative:                  Patient is an 84 year old female who is alert and oriented x1.  Patient has some dementia, assessment completed by speaking with patient's daughter.  Patient's daughter stated that she would like her to return to [facility] for short term rehab.  Patient will have palliative to follow at SNF.  Patient's daughter is hopeful that she will be able to get rehab and then return back home.  Expected Discharge Plan: Skilled Nursing Facility Barriers to Discharge: Continued Medical Work up, Ship broker   Patient Goals and CMS Choice Patient states their goals for this hospitalization and ongoing recovery are:: To return to SNF for short term rehab. CMS Medicare.gov Compare Post Acute Care list provided to:: Patient Represenative (must comment) Choice offered to / list presented to : Adult Children  Expected Discharge Plan and Services Expected Discharge Plan: [place] In-house Referral: Clinical Social Work   Post Acute Care Choice: [place] Living arrangements for the past 2 months: [place], [place] Expected Discharge Date: 10/25/18                           [facility] Agency: NA        Prior Living Arrangements/Services Living arrangements for the past 2 months: [place], [place] Lives with:: Adult Children Patient language and need for interpreter reviewed:: Yes Do you feel safe going back to the place where you live?: No   Patient and daughter feel she needs to continue with therapy before returning back home.  Need for Family Participation in Patient Care: Yes (Comment) Care giver support system in place?: Yes  (comment)   Criminal Activity/Legal Involvement Pertinent to Current Situation/Hospitalization: No - Comment as needed  Activities of Daily Living      Permission Sought/Granted Permission sought to share information with : Facility Sport and exercise psychologist, Family Supports Permission granted to share information with : Yes, Verbal Permission Granted  Share Information with NAME: Maximiano Coss Daughter [phone] or [place], [place] [phone]  Permission granted to share info w AGENCY: SNF admissions        Emotional Assessment Appearance:: Appears stated age   Affect (typically observed): Calm, Appropriate, Accepting Orientation: : Oriented to Self Alcohol / Substance Use: Not Applicable Psych Involvement: No (comment)  Admission diagnosis:  Acute on chronic congestive heart failure, unspecified heart failure type [facility]) [I50.9] Patient Active Problem List   Diagnosis Date Noted  . Acute exacerbation of CHF (congestive heart failure) ([place])   . Acute on chronic congestive heart failure ([place])   . Palliative care encounter   . Acute on chronic systolic heart failure ([place]) 10/13/2018  . Pressure injury of skin 10/02/2018  . Somnolence 10/01/2018  . Stroke ([place]) 10/01/2018  . Persistent atrial fibrillation ([place]) 09/23/2018  . Skin ulcer, limited to breakdown of skin ([place]) 09/23/2018  . Left arm swelling 09/23/2018  . Acute deep vein thrombosis (DVT) of left lower extremity ([place]) 06/27/2018  . Age-related osteoporosis with current pathological fracture with routine healing 07/24/2017  . Breast cancer ([place]) 07/24/2017  . Femur fracture ([place])  07/20/2017  . HTN (hypertension) 07/20/2017  . PAF (paroxysmal atrial fibrillation) ([place]) 07/20/2017  . Primary cancer of upper inner quadrant of left female breast ([place]) 07/17/2014  . Acute on chronic HFrEF (heart failure with reduced ejection fraction) ([place]) 03/24/2011  . ICD (implantable cardiac defibrillator) in place  03/24/2011  . Dyslipidemia 03/24/2011  . Other primary cardiomyopathies    PCP:  Ellamae Sia, MD Pharmacy:   [place], [place] - [address] [address] Phone: [phone] Fax: [phone]  [id number] [place], [place] - [address] [place] [address] Phone: [phone] Fax: [place], [place] [address] Phone: [phone] Fax: [phone]     Social Determinants of Health (SDOH) Interventions    Readmission Risk Interventions Readmission Risk Prevention Plan 10/25/2018 10/19/2018 10/04/2018  Transportation Screening Complete Complete Complete  Social Work Consult for [place] Planning/Counseling - - Complete  Palliative Care Screening - - Not Applicable  Medication Review (RN Care Manager) Referral to Pharmacy Referral to Pharmacy Complete  PCP or Specialist appointment within 3-5 days of discharge Complete Complete -  [place] or Home Care Consult Not Complete Not Complete -  [place] or Home Care Consult Pt Refusal Comments Patient at SNF for short term rehab. Patient will be going to SNF. -  SW Recovery Care/Counseling Consult Complete Complete -  Palliative Care Screening Complete Not Applicable -  Skilled Nursing Facility Complete Complete -  Some recent data might be hidden

## 2018-10-25 NOTE — Progress Notes (Signed)
[facility] Daily Progress Note   Subjective: 10/22/18: 1. Irrigation and debridement of skin, soft tissue, and muscle of four wounds on the left leg (7 x 6 cm, 5 x 5 cm, 5 x 4 cm, and 3 x 1.5 cm) for approximately 90 cm2  10/11/18: 1. Ultrasound guidance for vascular access right femoral artery 2. Catheter placement into left SFA from right femoral approach 3. Aortogram and selective left lower extremity angiogram and right lower extremity angiogram through the sheath 4. StarClose closure device right femoral artery  Patient with continued left lower extremity pain with palpation and movement.   Objective: Vitals:   10/25/18 0105 10/25/18 0511 10/25/18 0720 10/25/18 0747  BP: 104/85 110/89 98/79   Pulse:  80 86   Resp:  17 19   Temp:  97.7 F (36.5 C) 98.3 F (36.8 C)   TempSrc:      SpO2:  96% 92% 97%  Weight:      Height:        Intake/Output Summary (Last 24 hours) at 10/25/2018 1036 Last data filed at 10/24/2018 2322 Gross per 24 hour  Intake -  Output 750 ml  Net -750 ml   Physical Exam: A&Ox1, NAD CV: RRR Pulmonary: CTA Bilaterally Abdomen: Soft, Nontender, Nondistended Vascular:  Left Lower Extremity: OR unna boot removed with wound nurse. All ulcerations are healthy with granulation wound beds. No active bleeding.    Laboratory: CBC    Component Value Date/Time   WBC 7.3 10/22/2018 0146   HGB 11.8 (L) 10/22/2018 0146   HGB 12.7 12/13/2013 0623   HCT 36.9 10/22/2018 0146   HCT 38.4 12/13/2013 0623   PLT 195 10/22/2018 0146   PLT 133 (L) 12/13/2013 0623   BMET    Component Value Date/Time   NA 143 10/24/2018 0531   NA 143 09/02/2018 1229   NA 136 12/13/2013 0623   K 3.4 (L) 10/25/2018 0450   K 3.8 12/13/2013 0623   CL 106 10/24/2018 0531   CL 104 12/13/2013 0623   CO2 28 10/24/2018 0531   CO2 28 12/13/2013 0623   GLUCOSE 108 (H) 10/24/2018 0531   GLUCOSE 100 (H)  12/13/2013 0623   BUN 29 (H) 10/24/2018 0531   BUN 38 (H) 09/02/2018 1229   BUN 16 12/13/2013 0623   CREATININE 0.95 10/24/2018 0531   CREATININE 1.01 12/13/2013 0623   CALCIUM 8.6 (L) 10/24/2018 0531   CALCIUM 9.7 12/13/2013 0623   GFRNONAA 56 (L) 10/24/2018 0531   GFRNONAA 56 (L) 12/13/2013 0623   GFRNONAA 58 (L) 09/27/2013 1332   GFRAA >60 10/24/2018 0531   GFRAA >60 12/13/2013 0623   GFRAA >60 09/27/2013 1332   Assessment/Planning: The patient is an 84 year old female with multiple medical issues including chronic wounds to left lower extremity status post angiogram status post debridement 1) OR unna boot removed this AM with wound nurse.  All wound beds are healthy with notable granulation tissue present.  No active bleeding.  New unna boot applied to the left lower extremity.  To be changed weekly.  Space boots ordered to the bilateral feet in an attempt to relieve constant pressure / worsening / new wound formation. 2) Will see in two weeks as an outpatient to monitor wound healing.   Vascular Surgery to sign off at this time. Please re-consult if needed.  Discussed with Dr. Ellis Parents Stegmayer PA-C 10/25/2018 10:36 AM

## 2018-10-25 NOTE — NC FL2 (Signed)
[place] LEVEL OF CARE SCREENING TOOL     IDENTIFICATION  Patient Name: Julia Gordon Birthdate: Mar 07, 1934 Sex: female Admission Date (Current Location): 10/13/2018  [place] and [place] Number:  Engineering geologist and Address:  [facility], [address] [place], [place] [address]      Provider Number: [id number]  Attending Physician Name and Address:  Henreitta Leber, MD  Relative Name and Phone Number:  Maximiano Coss Daughter [phone] or [place], [place] [phone]    Current Level of Care: Hospital Recommended Level of Care: [place] Prior Approval Number:    Date Approved/Denied:   PASRR Number: [id number]  Discharge Plan: SNF    Current Diagnoses: Patient Active Problem List   Diagnosis Date Noted  . Acute exacerbation of CHF (congestive heart failure) ([place])   . Acute on chronic congestive heart failure ([place])   . Palliative care encounter   . Acute on chronic systolic heart failure ([place]) 10/13/2018  . Pressure injury of skin 10/02/2018  . Somnolence 10/01/2018  . Stroke ([place]) 10/01/2018  . Persistent atrial fibrillation ([place]) 09/23/2018  . Skin ulcer, limited to breakdown of skin ([place]) 09/23/2018  . Left arm swelling 09/23/2018  . Acute deep vein thrombosis (DVT) of left lower extremity ([place]) 06/27/2018  . Age-related osteoporosis with current pathological fracture with routine healing 07/24/2017  . Breast cancer ([place]) 07/24/2017  . Femur fracture ([place]) 07/20/2017  . HTN (hypertension) 07/20/2017  . PAF (paroxysmal atrial fibrillation) ([place]) 07/20/2017  . Primary cancer of upper inner quadrant of left female breast ([place]) 07/17/2014  . Acute on chronic HFrEF (heart failure with reduced ejection fraction) ([place]) 03/24/2011  . ICD (implantable cardiac defibrillator) in place 03/24/2011  . Dyslipidemia 03/24/2011  . Other primary cardiomyopathies     Orientation RESPIRATION  BLADDER Height & Weight     Self  O2(3L) Incontinent Weight: 170 lb 3.2 oz (77.2 kg) Height:  5\' 2"  (157.5 cm)  BEHAVIORAL SYMPTOMS/MOOD NEUROLOGICAL BOWEL NUTRITION STATUS      Continent Diet(Cardiac)  AMBULATORY STATUS COMMUNICATION OF NEEDS Skin   Limited Assist Verbally PU Stage and Appropriate Care, Surgical wounds   PU Stage 2 Dressing: Daily(PRN and weekly)                   Personal Care Assistance Level of Assistance  Bathing, Feeding, Dressing Bathing Assistance: Limited assistance Feeding assistance: Limited assistance Dressing Assistance: Limited assistance     Functional Limitations Info  Sight, Hearing, Speech Sight Info: Adequate Hearing Info: Adequate Speech Info: Adequate    SPECIAL CARE FACTORS FREQUENCY  PT (By licensed PT), OT (By licensed OT)     PT Frequency: Minimum 5x a week OT Frequency: Minimum 5x a week            Contractures Contractures Info: Not present    Additional Factors Info  Code Status, Allergies Code Status Info: Partial Allergies Info: No Known Allergies           Current Medications (10/25/2018):  This is the current hospital active medication list Current Facility-Administered Medications  Medication Dose Route Frequency Provider Last Rate Last Dose  . acetaminophen (TYLENOL) tablet 650 mg  650 mg Oral Q6H PRN Algernon Huxley, MD   650 mg at 10/22/18 [id number]   Or  . acetaminophen (TYLENOL) suppository 650 mg  650 mg Rectal Q6H PRN Algernon Huxley, MD      . albuterol (PROVENTIL) (2.5 MG/3ML) 0.083% nebulizer  solution 2.5 mg  2.5 mg Nebulization Q2H PRN Algernon Huxley, MD      . aspirin EC tablet 81 mg  81 mg Oral Daily Algernon Huxley, MD   81 mg at 10/25/18 [id number]  . atorvastatin (LIPITOR) tablet 40 mg  40 mg Oral q1800 Algernon Huxley, MD   40 mg at 10/24/18 1858  . bisacodyl (DULCOLAX) EC tablet 5 mg  5 mg Oral Daily PRN Algernon Huxley, MD   5 mg at 10/24/18 2231  . Chlorhexidine Gluconate Cloth 2 % PADS 6 each  6 each Topical Daily  Algernon Huxley, MD   6 each at 10/24/18 1005  . dextrose 5 %-0.45 % sodium chloride infusion   Intravenous Continuous Algernon Huxley, MD 30 mL/hr at 10/25/18 0820    . furosemide (LASIX) tablet 40 mg  40 mg Oral BID Henreitta Leber, MD   40 mg at 10/25/18 [id number]  . haloperidol lactate (HALDOL) injection 1 mg  1 mg Intravenous Q6H PRN Algernon Huxley, MD   1 mg at 10/24/18 0050  . haloperidol lactate (HALDOL) injection 5 mg  5 mg Intravenous Once Algernon Huxley, MD      . ipratropium-albuterol (DUONEB) 0.5-2.5 (3) MG/3ML nebulizer solution 3 mL  3 mL Nebulization BID Algernon Huxley, MD   3 mL at 10/25/18 0747  . metoprolol succinate (TOPROL-XL) 24 hr tablet 50 mg  50 mg Oral Daily Algernon Huxley, MD   50 mg at 10/25/18 0921  . ondansetron (ZOFRAN) tablet 4 mg  4 mg Oral Q6H PRN Algernon Huxley, MD       Or  . ondansetron (ZOFRAN) injection 4 mg  4 mg Intravenous Q6H PRN Algernon Huxley, MD      . ondansetron [facility]) injection 4 mg  4 mg Intravenous Q6H PRN Algernon Huxley, MD      . rivaroxaban (XARELTO) tablet 20 mg  20 mg Oral Q supper Tawnya Crook, RPH   20 mg at 10/24/18 1625  . sacubitril-valsartan (ENTRESTO) 24-26 mg per tablet  1 tablet Oral BID Algernon Huxley, MD   1 tablet at 10/25/18 [id number]  . sodium chloride flush (NS) 0.9 % injection 10-40 mL  10-40 mL Intracatheter Q12H Algernon Huxley, MD   10 mL at 10/25/18 [id number]  . sodium chloride flush (NS) 0.9 % injection 10-40 mL  10-40 mL Intracatheter PRN Algernon Huxley, MD   20 mL at 10/24/18 0019  . traMADol (ULTRAM) tablet 50 mg  50 mg Oral Q6H PRN Algernon Huxley, MD   50 mg at 10/25/18 0659  . traZODone (DESYREL) tablet 25 mg  25 mg Oral QHS PRN Algernon Huxley, MD   25 mg at 10/25/18 0105     Discharge Medications: Please see discharge summary for a list of discharge medications.  Relevant Imaging Results:  Relevant Lab Results:   Additional Information SSN [id number]  Ross Ludwig, LCSW

## 2018-10-25 NOTE — TOC Progression Note (Addendum)
Transition of Care [facility]) - Progression Note    Patient Details  Name: Julia Gordon MRN: [id number] Date of Birth: December 05, 1934  Transition of Care [facility]) CM/SW Contact  Ross Ludwig, [name] Phone Number: 10/25/2018, 4:55 PM  Clinical Narrative:     CSW was informed that patient will have to have insurance authorization before she is able to return to [facility].  CSW faxed required clinical information to patient's insurance company, awaiting for insurance approval.  CSW updated SNF and patient's daughter that insurance was still pending.  CSW informed bedside nurse, and physician.   Expected Discharge Plan: [place] Barriers to Discharge: Continued Medical Work up, Ship broker  Expected Discharge Plan and Services Expected Discharge Plan: [place] In-house Referral: Clinical Social Work   Post Acute Care Choice: [place] Living arrangements for the past 2 months: [place], [place] Expected Discharge Date: 10/25/18                           [facility] Agency: NA         Social Determinants of Health (SDOH) Interventions    Readmission Risk Interventions Readmission Risk Prevention Plan 10/25/2018 10/19/2018 10/04/2018  Transportation Screening Complete Complete Complete  Social Work Consult for [place] Planning/Counseling - - Complete  Palliative Care Screening - - Not Applicable  Medication Review Press photographer) Referral to Pharmacy Referral to Pharmacy Complete  PCP or Specialist appointment within 3-5 days of discharge Complete Complete -  [place] or Home Care Consult Not Complete Not Complete -  [place] or Home Care Consult Pt Refusal Comments Patient at SNF for short term rehab. Patient will be going to SNF. -  SW Recovery Care/Counseling Consult Complete Complete -  Palliative Care Screening Complete Not Applicable -  Skilled Nursing Facility Complete Complete -  Some recent  data might be hidden

## 2018-10-25 NOTE — Care Management Important Message (Signed)
Important Message  Patient Details  Name: Julia Gordon MRN: [id number] Date of Birth: 1934-08-27   Medicare Important Message Given:  Yes     Dannette Barbara 10/25/2018, 11:01 AM

## 2018-10-26 LAB — GLUCOSE, CAPILLARY: Glucose-Capillary: 129 mg/dL — ABNORMAL HIGH (ref 70–99)

## 2018-10-26 MED ORDER — HEPARIN SOD (PORK) LOCK FLUSH 100 UNIT/ML IV SOLN
500.0000 [IU] | INTRAVENOUS | Status: AC | PRN
  Administered 2018-10-26: 15:00:00 500 [IU]
  Filled 2018-10-26: qty 5

## 2018-10-26 NOTE — Progress Notes (Signed)
New referral for [facility] community Palliative to follow at [facility] received from [employer]. Plan is for discharge today. Patient information given to referral. Flo Shanks BSN, RN, [facility] Liaison [phone]

## 2018-10-26 NOTE — Progress Notes (Signed)
[place] [facility] [place] NAME: Julia Gordon    MR#:  [id number]  DATE OF BIRTH:  1934-03-14  SUBJECTIVE:   Status post Left lower extremity angiogram and also debridement of left foot ulcers POD # 3.   No pain presently and overall feels well.   REVIEW OF SYSTEMS:    Review of Systems  Unable to perform ROS: Mental acuity    Nutrition: Heart Healthy Tolerating Diet: Yes Tolerating PT: Await Eval.   DRUG ALLERGIES:   Allergies  Allergen Reactions  . No Known Allergies     VITALS:  Blood pressure 92/74, pulse 81, temperature 97.8 F (36.6 C), temperature source Oral, resp. rate 19, height 5\' 2"  (1.575 m), weight 77.2 kg, SpO2 100 %.  PHYSICAL EXAMINATION:   Physical Exam  GENERAL:  84 y.o.-year-old patient lying in bed in no acute distress.  EYES: Pupils equal, round, reactive to light and accommodation. No scleral icterus. Extraocular muscles intact.  HEENT: Head atraumatic, normocephalic. Oropharynx and nasopharynx clear.  NECK:  Supple, no jugular venous distention. No thyroid enlargement, no tenderness.  LUNGS: Normal breath sounds bilaterally, no wheezing, rales, rhonchi. No use of accessory muscles of respiration.  CARDIOVASCULAR: S1, S2 normal. No murmurs, rubs, or gallops.  ABDOMEN: Soft, nontender, nondistended. Bowel sounds present. No organomegaly or mass.  EXTREMITIES: No cyanosis, clubbing or edema b/l.   Lower extremity wrapped in Kerlix from debridement.  NEUROLOGIC: Cranial nerves II through XII are intact. No focal Motor or sensory deficits b/l.   PSYCHIATRIC: The patient is alert and oriented x 1.  SKIN: No obvious rash, lesion, or ulcer.    LABORATORY PANEL:   CBC Recent Labs  Lab 10/22/18 0146  WBC 7.3  HGB 11.8*  HCT 36.9  PLT 195   ------------------------------------------------------------------------------------------------------------------  Chemistries  Recent Labs  Lab 10/22/18 0146   10/24/18 0531 10/25/18 0450  NA 139  --  143  --   K 3.2*  --  3.1* 3.4*  CL 105  --  106  --   CO2 30  --  28  --   GLUCOSE 117*   < > 108*  --   BUN 30*  --  29*  --   CREATININE 0.96  --  0.95  --   CALCIUM 8.5*  --  8.6*  --   MG 2.1  --   --   --    < > = values in this interval not displayed.   ------------------------------------------------------------------------------------------------------------------  Cardiac Enzymes No results for input(s): TROPONINI in the last 168 hours. ------------------------------------------------------------------------------------------------------------------  RADIOLOGY:  No results found.   ASSESSMENT AND PLAN:   84 year old female with past medical history of hypertension, hyperlipidemia, nonischemic cardiomyopathy, atrial fibrillation, history of colon cancer, breast cancer, COPD who presented to the hospital due to ulcers of the left lower extremity.  1.  Peripheral vascular disease status post bilateral leg ulcers-patient had worsening left lower extremity ulcers which were chronic. -Seen by vascular surgery and left lower angiogram done which showed no significant stenosis and they thought this was more related to poor flow from patient's severe cardiomyopathy. -Continue local wound care, status post debridement of the left lower extremity ulcers postop day #3 today.  2.  Acute on chronic systolic congestive heart failure-patient has underlying EF of 20 to 25%. -Clinically does not appear to be significantly volume overloaded today.  cont. Toprol, Lasix, Entresto.   3. Essential HTN - cont. Toprol.  4. COPD - no acute exacerbation.  - cont. Duonebs , Albuterol nebs as needed.   5.  History of atrial fibrillation-rate controlled.  Continue Toprol. -Continue Xarelto.  6. Hypokalemia -improving with supplementation and will cont. To monitor.     All the records are reviewed and case discussed with Care Management/Social  Worker. Management plans discussed with the patient, family and they are in agreement.  CODE STATUS: Partial Code  DVT Prophylaxis: Xarelto  TOTAL TIME TAKING CARE OF THIS PATIENT: 30 minutes.   POSSIBLE D/C IN 1-2 DAYS, DEPENDING ON CLINICAL CONDITION.   Henreitta Leber M.D on 10/26/2018 at 12:22 PM  Between 7am to 6pm - Pager - [phone]  After 6pm go to [email or web address] - Proofreader  [employer] [facility]  Office  [phone]  CC: Primary care physician; Ellamae Sia, MD

## 2018-10-26 NOTE — Plan of Care (Signed)
°  Problem: Clinical Measurements: °Goal: Will remain free from infection °Outcome: Progressing °Goal: Respiratory complications will improve °Outcome: Progressing °  °

## 2018-10-26 NOTE — TOC Transition Note (Signed)
Transition of Care [facility]) - CM/SW Discharge Note   Patient Details  Name: NYHA PADO MRN: [id number] Date of Birth: November 02, 1934  Transition of Care [facility]) CM/SW Contact:  Ross Ludwig, LCSW Phone Number: 10/26/2018, 12:21 PM   Clinical Narrative:     CSW received phone call that patient has been approved by insurance auth number is [id number], next review 10/28/18.  Wende Mott is the case manager to be contacted with updated clinicals to be faxed to [phone].  Patient to be d/c'ed today to [facility].  Patient and family agreeable to plans will transport via ems RN to call report to [phone].  CSW contacted patient's daughter [name] and updated her that patient will be discharging today.  Final next level of care: [place] Barriers to Discharge: Barriers Resolved   Patient Goals and CMS Choice Patient states their goals for this hospitalization and ongoing recovery are:: To return back to [place] to continue with therapy, and eventually return back home. CMS Medicare.gov Compare Post Acute Care list provided to:: Patient Represenative (must comment) Choice offered to / list presented to : Adult Children  Discharge Placement   Existing PASRR number confirmed : 10/25/18          Patient [facility] bed at: [facility] Patient to be transferred to facility by: [facility] Name of family member notified: [name] [phone] Patient and family notified of of transfer: 10/26/18  Discharge Plan and Services In-house Referral: Clinical Social Work   Post Acute Care Choice: [place]          DME Arranged: N/A           HH Agency: NA        Social Determinants of Health (SDOH) Interventions     Readmission Risk Interventions Readmission Risk Prevention Plan 10/25/2018 10/19/2018 10/04/2018  Transportation Screening Complete Complete Complete  Social Work Consult for [place]  Planning/Counseling - - Complete  Palliative Care Screening - - Not Applicable  Medication Review Press photographer) Referral to Pharmacy Referral to Pharmacy Complete  PCP or Specialist appointment within 3-5 days of discharge Complete Complete -  [place] or Home Care Consult Not Complete Not Complete -  [place] or Home Care Consult Pt Refusal Comments Patient at SNF for short term rehab. Patient will be going to SNF. -  SW Recovery Care/Counseling Consult Complete Complete -  Palliative Care Screening Complete Not Applicable -  Skilled Nursing Facility Complete Complete -  Some recent data might be hidden

## 2018-10-26 NOTE — Progress Notes (Signed)
Physical Therapy Treatment Patient Details Name: Julia Gordon MRN: [id number] DOB: [birth year] Today's Date: 10/26/2018    History of Present Illness Julia Gordon a 84 y.o. female with a history of breast cancer, colon cancer, history of DVT comes from [place] because of shortness of breath, edema of extremities.    PT Comments    Ready to try.  Motivated to attempt in/out of bed by herself.  Used rails heavily but only min assist to get fully upright and to edge of bed.  Sat 8 minutes edge of bed unsupported with no LOB.  LAQ in sitting.  Standing deferred due to fatigue and LE pain but overall progressed with bed mobility and transitions.  Fatigued with activity.   Follow Up Recommendations  SNF     Equipment Recommendations       Recommendations for Other Services       Precautions / Restrictions Restrictions Weight Bearing Restrictions: No    Mobility  Bed Mobility Overal bed mobility: Needs Assistance Bed Mobility: Supine to Sit;Sit to Supine Rolling: Min assist   Supine to sit: Min assist Sit to supine: Min assist   General bed mobility comments: increased time and rails but good effort and motivation to attempt on her own  Transfers                    Ambulation/Gait             General Gait Details: deferred   Stairs             Wheelchair Mobility    Modified Rankin (Stroke Patients Only)       Balance Overall balance assessment: Needs assistance Sitting-balance support: Feet supported;Single extremity supported Sitting balance-Leahy Scale: Good Sitting balance - Comments: able to sit for 8 minutes unsupported                                    Cognition Arousal/Alertness: Awake/alert Behavior During Therapy: WFL for tasks assessed/performed Overall Cognitive Status: History of cognitive impairments - at baseline                                 General Comments: She tells therapist  she lives in a house [place] [address]      Exercises Other Exercises Other Exercises: sitting edge of bed x 8 minutes LAQ BLE x 10    General Comments        Pertinent Vitals/Pain Pain Assessment: Faces Faces Pain Scale: Hurts even more Pain Location: B legs Pain Descriptors / Indicators: Aching Pain Intervention(s): Limited activity within patient's tolerance;Monitored during session    Home Living                      Prior Function            PT Goals (current goals can now be found in the care plan section) Progress towards PT goals: Progressing toward goals    Frequency    Min 2X/week      PT Plan Current plan remains appropriate    Co-evaluation              AM-PAC PT "6 Clicks" Mobility   Outcome Measure  Help needed turning from your back to your side while in a flat bed without using bedrails?: A Little  Help needed moving from lying on your back to sitting on the side of a flat bed without using bedrails?: A Little Help needed moving to and from a bed to a chair (including a wheelchair)?: A Lot Help needed standing up from a chair using your arms (e.g., wheelchair or bedside chair)?: A Lot Help needed to walk in hospital room?: Total Help needed climbing 3-5 steps with a railing? : Total 6 Click Score: 12    End of Session   Activity Tolerance: Patient tolerated treatment well;Patient limited by pain Patient left: in bed;with call bell/phone within reach;with bed alarm set Nurse Communication: Mobility status Pain - Right/Left: Left Pain - part of body: Leg     Time: 1202-1219 PT Time Calculation (min) (ACUTE ONLY): 17 min  Charges:  $Therapeutic Activity: 8-22 mins                     Chesley Noon, PTA 10/26/18, 12:28 PM

## 2018-10-26 NOTE — Progress Notes (Signed)
MD aware of pt's continuing low blood pressure. Pt is asymptomatic, and MD approved for patient to continue to discharge with no new orders. Pt has no complaints of pain or dizziness. Report called to Levada Dy, Therapist, sports, at [facility]. Packet ready for discharge. IV consult placed for port-a-cath de-access.

## 2018-10-27 DIAGNOSIS — J410 Simple chronic bronchitis: Secondary | ICD-10-CM | POA: Insufficient documentation

## 2018-10-27 DIAGNOSIS — J9611 Chronic respiratory failure with hypoxia: Secondary | ICD-10-CM | POA: Insufficient documentation

## 2018-11-01 ENCOUNTER — Ambulatory Visit (INDEPENDENT_AMBULATORY_CARE_PROVIDER_SITE_OTHER): Payer: Medicare Other | Admitting: Nurse Practitioner

## 2018-11-01 ENCOUNTER — Other Ambulatory Visit: Payer: Self-pay

## 2018-11-01 ENCOUNTER — Encounter (INDEPENDENT_AMBULATORY_CARE_PROVIDER_SITE_OTHER): Payer: Self-pay | Admitting: Nurse Practitioner

## 2018-11-01 VITALS — BP 112/81 | HR 98 | Resp 16

## 2018-11-01 DIAGNOSIS — I890 Lymphedema, not elsewhere classified: Secondary | ICD-10-CM | POA: Diagnosis not present

## 2018-11-01 DIAGNOSIS — S81802S Unspecified open wound, left lower leg, sequela: Secondary | ICD-10-CM

## 2018-11-01 DIAGNOSIS — I10 Essential (primary) hypertension: Secondary | ICD-10-CM

## 2018-11-01 DIAGNOSIS — E785 Hyperlipidemia, unspecified: Secondary | ICD-10-CM

## 2018-11-03 ENCOUNTER — Ambulatory Visit (INDEPENDENT_AMBULATORY_CARE_PROVIDER_SITE_OTHER): Payer: Medicare Other | Admitting: Internal Medicine

## 2018-11-03 ENCOUNTER — Encounter: Payer: Self-pay | Admitting: Internal Medicine

## 2018-11-03 ENCOUNTER — Other Ambulatory Visit: Payer: Self-pay

## 2018-11-03 VITALS — BP 110/80 | HR 106 | Ht 62.0 in | Wt 169.8 lb

## 2018-11-03 DIAGNOSIS — R079 Chest pain, unspecified: Secondary | ICD-10-CM

## 2018-11-03 DIAGNOSIS — I5022 Chronic systolic (congestive) heart failure: Secondary | ICD-10-CM | POA: Diagnosis not present

## 2018-11-03 DIAGNOSIS — I739 Peripheral vascular disease, unspecified: Secondary | ICD-10-CM

## 2018-11-03 DIAGNOSIS — I890 Lymphedema, not elsewhere classified: Secondary | ICD-10-CM

## 2018-11-03 DIAGNOSIS — I4819 Other persistent atrial fibrillation: Secondary | ICD-10-CM | POA: Diagnosis not present

## 2018-11-03 MED ORDER — METOPROLOL SUCCINATE ER 25 MG PO TB24: 75.0000 mg | ORAL_TABLET | Freq: Every day | ORAL | 1 refills | Status: AC

## 2018-11-03 NOTE — Patient Instructions (Signed)
Medication Instructions:  Your physician has recommended you make the following change in your medication:  1- INCREASE Metoprolol succinate to 75 mg by mouth once a day.  *If you need a refill on your cardiac medications before your next appointment, please call your pharmacy*  Lab Work: NONE If you have labs (blood work) drawn today and your tests are completely normal, you will receive your results only by: Marland Kitchen MyChart Message (if you have MyChart) OR . A paper copy in the mail If you have any lab test that is abnormal or we need to change your treatment, we will call you to review the results.  Testing/Procedures: NONE  Follow-Up: At [facility], you and your health needs are our priority.  As part of our continuing mission to provide you with exceptional heart care, we have created designated Provider Care Teams.  These Care Teams include your primary Cardiologist (physician) and Advanced Practice Providers (APPs -  Physician Assistants and Nurse Practitioners) who all work together to provide you with the care you need, when you need it.  Your next appointment:   2-3 weeks with Ignacia Bayley, NP.  The format for your next appointment:   In Person  Provider:   Murray Hodgkins, NP

## 2018-11-03 NOTE — Progress Notes (Signed)
Follow-up Outpatient Visit Date: 11/03/2018  Primary Care Provider: Ellamae Sia, MD [place] [id number]  Chief Complaint: Follow-up heart failure  HPI:  Julia Gordon is a 84 y.o. year-old female with history of chronic HFrEF secondary to NICM complicated by LV thrombus (2009), persistent atrial fibrillation, bilateral breast cancer, hypertension, hyperlipidemia, and LLE DVT on rivaroxaban, who presents for follow-up of heart failure and leg swelling.  I last saw her on 09/29/2018, at which time she noted some improvement in her leg swelling, though both legs were still edematous with weeping wounds.  She was scheduled to be seen in the wound care clinic 2 days later.  Preceding lower extremity arterial Doppler did not show evidence of obstructive disease.  We agreed to continue with diuresis and medical therapy for heart failure.  Upon evaluation in the wound care clinic on 10/01/2018, Julia Gordon was noted to be somnolent.  There was also concern for cellulitis involving her lower extremities.  She was therefore transported to the ED and subsequently admitted for confusion and possible sepsis.  Vascular surgery performed aortography and runoff.  She underwent angioplasty of the left popliteal and posterior tibial arteries.  Readmitted on 10/13/2018 and spent 13 days in the hospital with worsening left lower extremity ulcers.  Today, Julia Gordon reports that she continues to have leg swelling (L>R) and left arm swelling.  She endorses an episode of chest pain during her most recent hospitalization but is unable to provide further specifics.  She denies further chest pain. She has stable shortness of breath and continues to use supplemental oxygen.  She is currently at [facility].  She denies palpitations and lightheadedness, as well as bleeding.  --------------------------------------------------------------------------------------------------  Past Medical History:    Diagnosis Date   Anemia    Breast cancer ([place]) 2015   a. L mastectomy with chemoradiation   Colon adenocarcinoma (HCC)    COPD (chronic obstructive pulmonary disease) (HCC)    Depression    DJD (degenerative joint disease) of knee    right knee   DVT (deep venous thrombosis) ([place])    a. 06/2018 L popliteal and R peroneal DVTs-->Xarelto.   H/O hysterectomy with oophorectomy    for DUB   HFrEF (heart failure with reduced ejection fraction) ([place])    a. 06/2018 Echo: EF 25-30%.   History of depression    HLD (hyperlipidemia)    HTN (hypertension)    LV (left ventricular) mural thrombus    history of, resolved (echo 04/09)   NICM (nonischemic cardiomyopathy) ([place])    a. Reports prior h/o cath @ [facility]; b. status post [place] with [employer] lead in 2009 at [facility], Dr. Boyd Kerbs; c. 2014 - prev seen by G. Lovena Le, MD; c. 06/2018 Echo: EF 25-30%, DD. Diff HK. RVSP 68mmHg. Mildly dil RA. Mild to mod MR. Mod TR. Mild AI.   Persistent atrial fibrillation ([place])    a. noted in [place] from 2008-2009; b. CHADS2VASc 6 (CHF, HTN, age x 2, vascular disease, female)-->eliquis added 06/2018 in setting of LLE DVT.   Valvular heart disease    a. 06/2018 Echo: Mod TR, mild to mod MR, mild AI.   Past Surgical History:  Procedure Laterality Date   ABDOMINAL HYSTERECTOMY     BREAST BIOPSY Left 05/10/13   [place] bx-positive   COLONOSCOPY WITH PROPOFOL N/A 01/15/2017   Procedure: COLONOSCOPY WITH PROPOFOL;  Surgeon: Jonathon Bellows, MD;  Location: [facility] ENDOSCOPY;  Service: Gastroenterology;  Laterality: N/A;  LAPAROSCOPIC RIGHT COLON RESECTION  08/10   Dr Pat Patrick, for adenoca    LOWER EXTREMITY ANGIOGRAPHY Left 10/07/2018   Procedure: Lower Extremity Angiography;  Surgeon: Algernon Huxley, MD;  Location: [place] CV LAB;  Service: Cardiovascular;  Laterality: Left;   LOWER EXTREMITY ANGIOGRAPHY Left 10/21/2018   Procedure: Lower Extremity Angiography;  Surgeon: Algernon Huxley, MD;  Location:  [place] CV LAB;  Service: Cardiovascular;  Laterality: Left;   MASTECTOMY Left 2015   BREAST CA   OOPHORECTOMY     ORIF FEMUR FRACTURE Right 07/21/2017   Procedure: OPEN REDUCTION INTERNAL FIXATION (ORIF) DISTAL FEMUR FRACTURE;  Surgeon: Lovell Sheehan, MD;  Location: [facility] ORS;  Service: Orthopedics;  Laterality: Right;  femur    PACEMAKER INSERTION  2009   REPLACEMENT TOTAL KNEE  1990's   right    WOUND DEBRIDEMENT Left 10/22/2018   Procedure: DEBRIDEMENT WOUND;  Surgeon: Algernon Huxley, MD;  Location: [facility] ORS;  Service: General;  Laterality: Left;    Current Meds  Medication Sig   acetaminophen (TYLENOL) 325 MG tablet Take 2 tablets (650 mg total) by mouth every 6 (six) hours as needed for mild pain (or temp > 37.5 C (99.5 F)).   alendronate (FOSAMAX) 70 MG tablet TAKE 1 TAB WEEKLY 30 MINS BEFORE BREAKFAST WITH A FULL GLASS OF WATER. DO NOT LIE DOWN FOR 30 MINS (Patient taking differently: Take 70 mg by mouth once a week. )   aspirin EC 81 MG EC tablet Take 1 tablet (81 mg total) by mouth daily.   atorvastatin (LIPITOR) 40 MG tablet Take 1 tablet (40 mg total) by mouth daily at 6 PM.   furosemide (LASIX) 20 MG tablet Take 40 mg (2 tablets) by mouth every morning, take 1 tablet (20 mg total) once in the afternoon. (Patient taking differently: Take 1 tablet (20 mg total) once in the afternoon.)   isosorbide mononitrate (IMDUR) 30 MG 24 hr tablet Take 30 mg by mouth daily.   potassium chloride SA (K-DUR) 20 MEQ tablet Take 1 tablet (20 mEq total) by mouth daily.   rivaroxaban (XARELTO) 15 MG TABS tablet Take 1 tablet (15 mg total) by mouth daily with supper.   sacubitril-valsartan (ENTRESTO) 24-26 MG Take 1 tablet by mouth 2 (two) times daily.   senna-docusate (SENOKOT-S) 8.6-50 MG tablet Take 1 tablet by mouth at bedtime as needed for mild constipation or moderate constipation.   traMADol (ULTRAM) 50 MG tablet Take 1 tablet (50 mg total) by mouth every 6 (six) hours as  needed.   [DISCONTINUED] metoprolol succinate (TOPROL-XL) 50 MG 24 hr tablet Take 1 tablet (50 mg total) by mouth daily.    Allergies: No known allergies  Social History   Tobacco Use   Smoking status: Former Smoker   Smokeless tobacco: Never Used   Tobacco comment: quit 2006  Substance Use Topics   Alcohol use: No    Comment: former   Drug use: No    Family History  Problem Relation Age of Onset   Hypertension Father    Alzheimer's disease Mother    Breast cancer Sister 11   Diabetes Other        brothers   Stroke Brother    Heart failure Brother    Lupus Sister     Review of Systems: A 12-system review of systems was performed and was negative except as noted in the HPI.  --------------------------------------------------------------------------------------------------  Physical Exam: BP 110/80 (BP Location: Right Arm, Patient Position: Sitting,  Cuff Size: Normal)    Pulse (!) 106    Ht 5\' 2"  (1.575 m)    Wt 169 lb 12.1 oz (77 kg)    SpO2 97%    BMI 31.05 kg/m   General:  NAD HEENT: No conjunctival pallor or scleral icterus. Neck: Supple without lymphadenopathy or thyromegaly.  JVP ~8 cm with positive HJR. Lungs: Mildly diminished breath sounds throughout.  No wheezes or crackles. Heart: Distant heart sounds.  Irregularly irregular with 1/6 systolic murmur.gallops. Abd: Bowel sounds present. Soft, NT/ND without hepatosplenomegaly Ext: 3+ left arm edema.  1-2+ left calf and trace right calf edema. Skin: Warm and dry.  Left calf wrapped with gauze.  EKG:  Atrial fibrillation with rapid ventricular response (HR 102 bpm) with low voltage and poor R wave progression.  Lab Results  Component Value Date   WBC 7.3 10/22/2018   HGB 11.8 (L) 10/22/2018   HCT 36.9 10/22/2018   MCV 94.9 10/22/2018   PLT 195 10/22/2018    Lab Results  Component Value Date   NA 143 10/24/2018   K 3.4 (L) 10/25/2018   CL 106 10/24/2018   CO2 28 10/24/2018   BUN 29 (H)  10/24/2018   CREATININE 0.95 10/24/2018   GLUCOSE 108 (H) 10/24/2018   ALT 11 10/13/2018    Lab Results  Component Value Date   CHOL 134 10/02/2018   HDL 43 10/02/2018   LDLCALC 79 10/02/2018   TRIG 58 10/02/2018   CHOLHDL 3.1 10/02/2018    --------------------------------------------------------------------------------------------------  ASSESSMENT AND PLAN: Chronic HFrEF: Volume status is slowly improving, though Julia Gordon continues to appear fluid overloaded.  I recommend continuation of her current dose of furosemide (40 mg QAM and 20 mg QPM).  I will increase metoprolol succinate to 75 mg daily for improve HR control and advancement of GDMT.  We will conitnue current dose of Entresto.  Chest pain: Julia Gordon reports an episode of chest pain during recent hospitalization but cannot elaborate.  We will increase metoprolol succinate to 75 mg daily and continue isosorbide mononitrate 30 mg daily.  Continue aspirin 81 mg daily and rivaroxban, particularly given recent peripheral intervention.  No plans for ischemia evaluation at this time given comorbidities.  PAD: Patient reports gradual healing of left calf wounds.  Continue secondary prevention and anticoagulation/antiplatelet therapy per vascular surgery.  Persistent atrial fibrillation: Ventricular rate control suboptimal.  We will increase metoprolol succinate to 75 mg daily and continue rivaroxaban 15 mg daily (based on chronic GFR  <50).  If GFR remains above 50 long term, we may need to increase the dose.  Lymphedema: Involves left arm s/p breast cancer and lymph node dissection.  Patient has a sleeve but her daughter is concerned that it is not being applied regularly at SNF.  I have encouraged regular, proper use.  Further management per vascular surgery and oncology.  Follow-up: Return to clinic in 2-3 weeks.  Nelva Bush, MD 11/04/2018 8:28 PM

## 2018-11-04 ENCOUNTER — Encounter: Payer: Self-pay | Admitting: Internal Medicine

## 2018-11-04 DIAGNOSIS — I739 Peripheral vascular disease, unspecified: Secondary | ICD-10-CM | POA: Insufficient documentation

## 2018-11-04 DIAGNOSIS — I890 Lymphedema, not elsewhere classified: Secondary | ICD-10-CM | POA: Insufficient documentation

## 2018-11-05 ENCOUNTER — Inpatient Hospital Stay
Admission: EM | Admit: 2018-11-05 | Discharge: 2018-12-07 | DRG: 871 | Disposition: E | Payer: Medicare Other | Source: Skilled Nursing Facility | Attending: Internal Medicine | Admitting: Internal Medicine

## 2018-11-05 ENCOUNTER — Emergency Department: Payer: Medicare Other

## 2018-11-05 ENCOUNTER — Other Ambulatory Visit: Payer: Self-pay

## 2018-11-05 ENCOUNTER — Encounter: Payer: Self-pay | Admitting: Emergency Medicine

## 2018-11-05 DIAGNOSIS — Z7901 Long term (current) use of anticoagulants: Secondary | ICD-10-CM

## 2018-11-05 DIAGNOSIS — R680 Hypothermia, not associated with low environmental temperature: Secondary | ICD-10-CM | POA: Diagnosis present

## 2018-11-05 DIAGNOSIS — I110 Hypertensive heart disease with heart failure: Secondary | ICD-10-CM | POA: Diagnosis present

## 2018-11-05 DIAGNOSIS — D649 Anemia, unspecified: Secondary | ICD-10-CM | POA: Diagnosis present

## 2018-11-05 DIAGNOSIS — I739 Peripheral vascular disease, unspecified: Secondary | ICD-10-CM | POA: Diagnosis present

## 2018-11-05 DIAGNOSIS — Z86718 Personal history of other venous thrombosis and embolism: Secondary | ICD-10-CM

## 2018-11-05 DIAGNOSIS — R0602 Shortness of breath: Secondary | ICD-10-CM | POA: Diagnosis present

## 2018-11-05 DIAGNOSIS — Z833 Family history of diabetes mellitus: Secondary | ICD-10-CM

## 2018-11-05 DIAGNOSIS — I5023 Acute on chronic systolic (congestive) heart failure: Secondary | ICD-10-CM | POA: Diagnosis present

## 2018-11-05 DIAGNOSIS — N170 Acute kidney failure with tubular necrosis: Secondary | ICD-10-CM | POA: Diagnosis present

## 2018-11-05 DIAGNOSIS — A419 Sepsis, unspecified organism: Principal | ICD-10-CM | POA: Diagnosis present

## 2018-11-05 DIAGNOSIS — Z853 Personal history of malignant neoplasm of breast: Secondary | ICD-10-CM

## 2018-11-05 DIAGNOSIS — Z20828 Contact with and (suspected) exposure to other viral communicable diseases: Secondary | ICD-10-CM | POA: Diagnosis present

## 2018-11-05 DIAGNOSIS — Z95810 Presence of automatic (implantable) cardiac defibrillator: Secondary | ICD-10-CM

## 2018-11-05 DIAGNOSIS — L03116 Cellulitis of left lower limb: Secondary | ICD-10-CM | POA: Diagnosis present

## 2018-11-05 DIAGNOSIS — L03115 Cellulitis of right lower limb: Secondary | ICD-10-CM | POA: Diagnosis present

## 2018-11-05 DIAGNOSIS — I509 Heart failure, unspecified: Secondary | ICD-10-CM | POA: Diagnosis not present

## 2018-11-05 DIAGNOSIS — Z66 Do not resuscitate: Secondary | ICD-10-CM | POA: Diagnosis not present

## 2018-11-05 DIAGNOSIS — F329 Major depressive disorder, single episode, unspecified: Secondary | ICD-10-CM | POA: Diagnosis present

## 2018-11-05 DIAGNOSIS — I428 Other cardiomyopathies: Secondary | ICD-10-CM | POA: Diagnosis present

## 2018-11-05 DIAGNOSIS — R6520 Severe sepsis without septic shock: Secondary | ICD-10-CM | POA: Diagnosis present

## 2018-11-05 DIAGNOSIS — E872 Acidosis: Secondary | ICD-10-CM | POA: Diagnosis present

## 2018-11-05 DIAGNOSIS — J449 Chronic obstructive pulmonary disease, unspecified: Secondary | ICD-10-CM | POA: Diagnosis present

## 2018-11-05 DIAGNOSIS — L97919 Non-pressure chronic ulcer of unspecified part of right lower leg with unspecified severity: Secondary | ICD-10-CM | POA: Diagnosis present

## 2018-11-05 DIAGNOSIS — Z79891 Long term (current) use of opiate analgesic: Secondary | ICD-10-CM

## 2018-11-05 DIAGNOSIS — Z85038 Personal history of other malignant neoplasm of large intestine: Secondary | ICD-10-CM

## 2018-11-05 DIAGNOSIS — I4819 Other persistent atrial fibrillation: Secondary | ICD-10-CM | POA: Diagnosis present

## 2018-11-05 DIAGNOSIS — Z823 Family history of stroke: Secondary | ICD-10-CM

## 2018-11-05 DIAGNOSIS — E785 Hyperlipidemia, unspecified: Secondary | ICD-10-CM | POA: Diagnosis present

## 2018-11-05 DIAGNOSIS — Z832 Family history of diseases of the blood and blood-forming organs and certain disorders involving the immune mechanism: Secondary | ICD-10-CM

## 2018-11-05 DIAGNOSIS — Z820 Family history of epilepsy and other diseases of the nervous system: Secondary | ICD-10-CM

## 2018-11-05 DIAGNOSIS — Z515 Encounter for palliative care: Secondary | ICD-10-CM | POA: Diagnosis not present

## 2018-11-05 DIAGNOSIS — Z87891 Personal history of nicotine dependence: Secondary | ICD-10-CM

## 2018-11-05 DIAGNOSIS — Z8249 Family history of ischemic heart disease and other diseases of the circulatory system: Secondary | ICD-10-CM

## 2018-11-05 DIAGNOSIS — Z90710 Acquired absence of both cervix and uterus: Secondary | ICD-10-CM

## 2018-11-05 DIAGNOSIS — J9611 Chronic respiratory failure with hypoxia: Secondary | ICD-10-CM | POA: Diagnosis present

## 2018-11-05 DIAGNOSIS — Z7982 Long term (current) use of aspirin: Secondary | ICD-10-CM

## 2018-11-05 DIAGNOSIS — Z79899 Other long term (current) drug therapy: Secondary | ICD-10-CM

## 2018-11-05 DIAGNOSIS — L97929 Non-pressure chronic ulcer of unspecified part of left lower leg with unspecified severity: Secondary | ICD-10-CM | POA: Diagnosis present

## 2018-11-05 DIAGNOSIS — Z9012 Acquired absence of left breast and nipple: Secondary | ICD-10-CM

## 2018-11-05 DIAGNOSIS — Z803 Family history of malignant neoplasm of breast: Secondary | ICD-10-CM

## 2018-11-05 DIAGNOSIS — R0600 Dyspnea, unspecified: Secondary | ICD-10-CM

## 2018-11-05 DIAGNOSIS — Z96651 Presence of right artificial knee joint: Secondary | ICD-10-CM | POA: Diagnosis present

## 2018-11-05 LAB — URINALYSIS, ROUTINE W REFLEX MICROSCOPIC
Bilirubin Urine: NEGATIVE
Glucose, UA: NEGATIVE mg/dL
Ketones, ur: NEGATIVE mg/dL
Nitrite: NEGATIVE
Protein, ur: 100 mg/dL — AB
Specific Gravity, Urine: 1.018 (ref 1.005–1.030)
WBC, UA: >50 WBC/hpf — ABNORMAL HIGH (ref 0–5)
pH: 5 (ref 5.0–8.0)

## 2018-11-05 LAB — CBC
HCT: 38.2 % (ref 36.0–46.0)
Hemoglobin: 12.4 g/dL (ref 12.0–15.0)
MCH: 30.6 pg (ref 26.0–34.0)
MCHC: 32.5 g/dL (ref 30.0–36.0)
MCV: 94.3 fL (ref 80.0–100.0)
Platelets: 167 10*3/uL (ref 150–400)
RBC: 4.05 MIL/uL (ref 3.87–5.11)
RDW: 18.5 % — ABNORMAL HIGH (ref 11.5–15.5)
WBC: 23.6 10*3/uL — ABNORMAL HIGH (ref 4.0–10.5)
nRBC: 0.3 % — ABNORMAL HIGH (ref 0.0–0.2)

## 2018-11-05 LAB — PROTIME-INR
INR: 2.1 — ABNORMAL HIGH (ref 0.8–1.2)
Prothrombin Time: 23.5 seconds — ABNORMAL HIGH (ref 11.4–15.2)

## 2018-11-05 LAB — COMPREHENSIVE METABOLIC PANEL
ALT: 36 U/L (ref 0–44)
AST: 77 U/L — ABNORMAL HIGH (ref 15–41)
Albumin: 3 g/dL — ABNORMAL LOW (ref 3.5–5.0)
Alkaline Phosphatase: 84 U/L (ref 38–126)
Anion gap: 16 — ABNORMAL HIGH (ref 5–15)
BUN: 39 mg/dL — ABNORMAL HIGH (ref 8–23)
CO2: 20 mmol/L — ABNORMAL LOW (ref 22–32)
Calcium: 8.8 mg/dL — ABNORMAL LOW (ref 8.9–10.3)
Chloride: 100 mmol/L (ref 98–111)
Creatinine, Ser: 1.56 mg/dL — ABNORMAL HIGH (ref 0.44–1.00)
GFR calc Af Amer: 35 mL/min — ABNORMAL LOW (ref >60)
GFR calc non Af Amer: 31 mL/min — ABNORMAL LOW (ref >60)
Glucose, Bld: 88 mg/dL (ref 70–99)
Potassium: 4.8 mmol/L (ref 3.5–5.1)
Sodium: 136 mmol/L (ref 135–145)
Total Bilirubin: 3.4 mg/dL — ABNORMAL HIGH (ref 0.3–1.2)
Total Protein: 6.6 g/dL (ref 6.5–8.1)

## 2018-11-05 LAB — BLOOD GAS, ARTERIAL
Acid-base deficit: 6.6 mmol/L — ABNORMAL HIGH (ref 0.0–2.0)
Bicarbonate: 16.7 mmol/L — ABNORMAL LOW (ref 20.0–28.0)
FIO2: 0.44
O2 Saturation: 99.6 %
Patient temperature: 37
pCO2 arterial: 27 mmHg — ABNORMAL LOW (ref 32.0–48.0)
pH, Arterial: 7.4 (ref 7.350–7.450)
pO2, Arterial: 180 mmHg — ABNORMAL HIGH (ref 83.0–108.0)

## 2018-11-05 LAB — SARS CORONAVIRUS 2 BY RT PCR (HOSPITAL ORDER, PERFORMED IN ~~LOC~~ HOSPITAL LAB): SARS Coronavirus 2: NEGATIVE

## 2018-11-05 LAB — LACTIC ACID, PLASMA
Lactic Acid, Venous: 3.9 mmol/L (ref 0.5–1.9)
Lactic Acid, Venous: 5.7 mmol/L (ref 0.5–1.9)

## 2018-11-05 LAB — TROPONIN I (HIGH SENSITIVITY): Troponin I (High Sensitivity): 39 ng/L — ABNORMAL HIGH (ref <18)

## 2018-11-05 LAB — BRAIN NATRIURETIC PEPTIDE: B Natriuretic Peptide: 4374 pg/mL — ABNORMAL HIGH (ref 0.0–100.0)

## 2018-11-05 MED ORDER — METRONIDAZOLE IN NACL 5-0.79 MG/ML-% IV SOLN
500.0000 mg | Freq: Once | INTRAVENOUS | Status: AC
  Administered 2018-11-05: 500 mg via INTRAVENOUS
  Filled 2018-11-05: qty 100

## 2018-11-05 MED ORDER — VANCOMYCIN HCL 1.5 G IV SOLR
1500.0000 mg | Freq: Once | INTRAVENOUS | Status: AC
  Administered 2018-11-05: 1500 mg via INTRAVENOUS
  Filled 2018-11-05: qty 1500

## 2018-11-05 MED ORDER — ATORVASTATIN CALCIUM 20 MG PO TABS: 40.0000 mg | ORAL_TABLET | Freq: Every day | ORAL | Status: DC

## 2018-11-05 MED ORDER — ACETAMINOPHEN 650 MG RE SUPP: 650.0000 mg | Freq: Four times a day (QID) | RECTAL | Status: DC | PRN

## 2018-11-05 MED ORDER — TRAMADOL HCL 50 MG PO TABS: 50.0000 mg | ORAL_TABLET | Freq: Four times a day (QID) | ORAL | Status: DC | PRN

## 2018-11-05 MED ORDER — ALBUTEROL SULFATE (2.5 MG/3ML) 0.083% IN NEBU: 2.5000 mg | INHALATION_SOLUTION | RESPIRATORY_TRACT | Status: DC | PRN

## 2018-11-05 MED ORDER — VANCOMYCIN HCL IN DEXTROSE 1-5 GM/200ML-% IV SOLN
1000.0000 mg | INTRAVENOUS | Status: DC
  Filled 2018-11-05: qty 200

## 2018-11-05 MED ORDER — SODIUM CHLORIDE 0.9 % IV SOLN
2.0000 g | INTRAVENOUS | Status: DC
  Filled 2018-11-05: qty 2

## 2018-11-05 MED ORDER — METOPROLOL SUCCINATE ER 50 MG PO TB24: 75.0000 mg | ORAL_TABLET | Freq: Every day | ORAL | Status: DC

## 2018-11-05 MED ORDER — RIVAROXABAN 15 MG PO TABS: 15.0000 mg | ORAL_TABLET | Freq: Every day | ORAL | Status: DC

## 2018-11-05 MED ORDER — ISOSORBIDE MONONITRATE ER 30 MG PO TB24: 30.0000 mg | ORAL_TABLET | Freq: Every day | ORAL | Status: DC

## 2018-11-05 MED ORDER — SODIUM CHLORIDE 0.9 % IV SOLN
2.0000 g | Freq: Once | INTRAVENOUS | Status: AC
  Administered 2018-11-05: 2 g via INTRAVENOUS
  Filled 2018-11-05: qty 2

## 2018-11-05 MED ORDER — ASPIRIN EC 81 MG PO TBEC: 81.0000 mg | DELAYED_RELEASE_TABLET | Freq: Every day | ORAL | Status: DC

## 2018-11-05 MED ORDER — ONDANSETRON HCL 4 MG/2ML IJ SOLN: 4.0000 mg | Freq: Four times a day (QID) | INTRAMUSCULAR | Status: DC | PRN

## 2018-11-05 MED ORDER — SODIUM CHLORIDE 0.9 % IV SOLN
INTRAVENOUS | Status: AC
  Administered 2018-11-05: 23:00:00 via INTRAVENOUS

## 2018-11-05 MED ORDER — ONDANSETRON HCL 4 MG PO TABS: 4.0000 mg | ORAL_TABLET | Freq: Four times a day (QID) | ORAL | Status: DC | PRN

## 2018-11-05 MED ORDER — VANCOMYCIN HCL IN DEXTROSE 1-5 GM/200ML-% IV SOLN: 1000.0000 mg | Freq: Once | INTRAVENOUS | Status: DC

## 2018-11-05 MED ORDER — ACETAMINOPHEN 325 MG PO TABS: 650.0000 mg | ORAL_TABLET | Freq: Four times a day (QID) | ORAL | Status: DC | PRN

## 2018-11-05 MED ORDER — POLYETHYLENE GLYCOL 3350 17 G PO PACK: 17.0000 g | PACK | Freq: Every day | ORAL | Status: DC | PRN

## 2018-11-05 MED ORDER — NICOTINE 21 MG/24HR TD PT24
21.0000 mg | MEDICATED_PATCH | Freq: Once | TRANSDERMAL | Status: DC
  Administered 2018-11-05: 21 mg via TRANSDERMAL
  Filled 2018-11-05: qty 1

## 2018-11-05 MED ORDER — SENNA 8.6 MG PO TABS: 1.0000 | ORAL_TABLET | Freq: Two times a day (BID) | ORAL | Status: DC

## 2018-11-05 NOTE — ED Notes (Signed)
Notified by lab that pt is refusing additional blood sticks at this time. Educated provided for patient as to reason labs are needed, however pt still refusing.

## 2018-11-05 NOTE — ED Notes (Signed)
Pt now on 4lpm n/c, placed by RT after ABG results.

## 2018-11-05 NOTE — Consult Note (Signed)
CODE SEPSIS - PHARMACY COMMUNICATION  **Broad Spectrum Antibiotics should be administered within 1 hour of Sepsis diagnosis**  Time Code Sepsis Called/Page Received: [id number]  Antibiotics Ordered: cefepime, vancomycin and metronidazole  Time of 1st antibiotic administration: 1857  Additional action taken by pharmacy: none required  If necessary, Name of Provider/Nurse Contacted: N/A   Dallie Piles ,PharmD Clinical Pharmacist  10/31/2018  7:03 PM

## 2018-11-05 NOTE — ED Triage Notes (Signed)
Pt arrived via [facility] from [facility] c/o SOB and increased WOB. EMS transported her on 15L non-rebreather and pt was still in 80's SPO2, however EMS stated pt was in NAD during transport.

## 2018-11-05 NOTE — ED Notes (Signed)
Page placed by Network engineer for attending MD to call nurse back regarding possible need for IVF d/t elevated lactic acid and BP 97/83. IP RN aware.

## 2018-11-05 NOTE — ED Notes (Signed)
Still unable to obtain accurate pulse ox. EDP to place order for ABG. RT notified.

## 2018-11-05 NOTE — ED Notes (Signed)
Unable to obtain accurate O2 sat at this time d/t poor perfusion. Have tried multiple fingers, earlobes, and nose. EDP aware. Pt given blankets, will monitor.

## 2018-11-05 NOTE — Consult Note (Signed)
Pharmacy Antibiotic Note  Julia Gordon is a 84 y.o. female admitted on 10/19/2018 with sepsis with the origin being unclear at this point, although she does have wounds to the left lower extremity as a possible cause.  CXR is unremarkable. Pharmacy has been consulted for vancomycin and cefepime dosing. Her SCr is elevated well above her baseline of approximately 1.0 mg/dL  Plan: 1) Vancomycin 1500 mg IV x 1 loading dose then vancomycin 1000 mg IV Q 48 hrs Goal AUC 400-550 Expected AUC: 573 SCr used: 1.56  T1/2: 30.6h BMI 30.7  2) begin cefepime 2 grams IV every 24 hours  Height: 5\' 2"  (157.5 cm) Weight: 169 lb 12.1 oz (77 kg) IBW/kg (Calculated) : 50.1  No data recorded.  Recent Labs  Lab 10/18/2018 1642 11/01/2018 1645  WBC 23.6*  --   CREATININE 1.56*  --   LATICACIDVEN  --  3.9*    Estimated Creatinine Clearance: 26.7 mL/min (A) (by C-G formula based on SCr of 1.56 mg/dL (H)).    Antimicrobials this admission: vancomycin 10/30 >>  cefepime 10/30 >>   Microbiology results: 10/30 BCx: pending 10/30 UCx: pending  10/30 SARS CoV-2: negative   Thank you for allowing pharmacy to be a part of this patient's care.  Dallie Piles, PharmD 10/14/2018 7:08 PM

## 2018-11-05 NOTE — Consult Note (Signed)
PHARMACY -  BRIEF ANTIBIOTIC NOTE   Pharmacy has received consult(s) for vancomycin and cefepime from an ED provider.  The patient's profile has been reviewed for ht/wt/allergies/indication/available labs.    One time order(s) placed for:  1) cefepime 2 grams   2) vancomycin 1500 mg  Further antibiotics/pharmacy consults should be ordered by admitting physician if indicated.                       Thank you,  Dallie Piles, PharmD 10/25/2018  6:29 PM

## 2018-11-05 NOTE — ED Notes (Signed)
Date and time results received: 11/04/2018 607 (use smartphrase ".now" to insert current time)  Test: Lactic Acid Critical Value: 3.9  Name of Provider Notified: Dr Kerman Passey   Orders Received? Or Actions Taken?: see orders for details

## 2018-11-05 NOTE — Progress Notes (Signed)
MD Jannifer Franklin made aware of lactic 3.9 and low BP. STAT lactic ordered and 50 ml/h gentle fluids initiated. This nurse was able to get blood from chest port, labs sent and awaiting results. Pt has no concerns at this time.

## 2018-11-05 NOTE — ED Notes (Signed)
Unable to collect 2nd lactic acid, port is not drawing back at this time. Unable to locate adequate site for straight stick. Lab called to attempt.

## 2018-11-05 NOTE — ED Notes (Signed)
Only able to obtain 1 set of blood cultures from R chest port d/t poor venous access. EDP notified prior to abt started.

## 2018-11-05 NOTE — ED Provider Notes (Signed)
[facility] Emergency Department Provider Note  Time seen: 4:39 PM  I have reviewed the triage vital signs and the nursing notes.   HISTORY  Chief Complaint Shortness of Breath  HPI Julia Gordon is a 84 y.o. female with a past medical history of anemia, COPD, hypertension, hyperlipidemia, CHF with an EF of 25 to 30%, wears 2 L of O2 24/7, presents to the emergency department from a nursing facility for shortness of breath.  According to EMS report patient has been feeling short of breath, they were unable to get a O2 saturation at the nursing home, EMS placed the patient on nonrebreather and brought to the emergency department with sats in the 80s per EMS on a nonrebreather.  Patient states her legs are painful they are swollen.  Cannot tell me for how long they have been swelling.  Does not think she has a fever but is not sure.  Cannot provide much of a history.   Past Medical History:  Diagnosis Date  . Anemia   . Breast cancer ([place]) 2015   a. L mastectomy with chemoradiation  . Colon adenocarcinoma ([place])   . COPD (chronic obstructive pulmonary disease) ([place])   . Depression   . DJD (degenerative joint disease) of knee    right knee  . DVT (deep venous thrombosis) ([place])    a. 06/2018 L popliteal and R peroneal DVTs-->Xarelto.  . H/O hysterectomy with oophorectomy    for DUB  . HFrEF (heart failure with reduced ejection fraction) ([place])    a. 06/2018 Echo: EF 25-30%.  . History of depression   . HLD (hyperlipidemia)   . HTN (hypertension)   . LV (left ventricular) mural thrombus    history of, resolved (echo 04/09)  . NICM (nonischemic cardiomyopathy) ([place])    a. Reports prior h/o cath @ [facility]; b. status post [place] with Guidant lead in 2009 at [facility], Dr. Boyd Kerbs; c. 2014 - prev seen by G. Lovena Le, MD; c. 06/2018 Echo: EF 25-30%, DD. Diff HK. RVSP 71mmHg. Mildly dil RA. Mild to mod MR. Mod TR. Mild AI.  Marland Kitchen Persistent atrial fibrillation ([place])    a.  noted in [place] from 2008-2009; b. CHADS2VASc 6 (CHF, HTN, age x 2, vascular disease, female)-->eliquis added 06/2018 in setting of LLE DVT.  . Valvular heart disease    a. 06/2018 Echo: Mod TR, mild to mod MR, mild AI.    Patient Active Problem List   Diagnosis Date Noted  . PAD (peripheral artery disease) ([place]) 11/04/2018  . Lymphedema 11/04/2018  . Chronic respiratory failure with hypoxia ([place]) 10/27/2018  . Simple chronic bronchitis ([place]) 10/27/2018  . Acute exacerbation of CHF (congestive heart failure) ([place])   . Acute on chronic congestive heart failure ([place])   . Palliative care encounter   . Chronic HFrEF (heart failure with reduced ejection fraction) ([place]) 10/13/2018  . Encephalopathy 10/11/2018  . Pressure injury of skin 10/02/2018  . Somnolence 10/01/2018  . Stroke ([place]) 10/01/2018  . Persistent atrial fibrillation ([place]) 09/23/2018  . Skin ulcer, limited to breakdown of skin ([place]) 09/23/2018  . Left arm swelling 09/23/2018  . Acute deep vein thrombosis (DVT) of left lower extremity ([place]) 06/27/2018  . Age-related osteoporosis with current pathological fracture with routine healing 07/24/2017  . Breast cancer ([place]) 07/24/2017  . Femur fracture ([place]) 07/20/2017  . HTN (hypertension) 07/20/2017  . PAF (paroxysmal atrial fibrillation) ([place]) 07/20/2017  . Primary cancer of upper inner quadrant of left  female breast ([place]) 07/17/2014  . Acute on chronic HFrEF (heart failure with reduced ejection fraction) ([place]) 03/24/2011  . ICD (implantable cardiac defibrillator) in place 03/24/2011  . Dyslipidemia 03/24/2011  . Other primary cardiomyopathies     Past Surgical History:  Procedure Laterality Date  . ABDOMINAL HYSTERECTOMY    . BREAST BIOPSY Left 05/10/13   [place] bx-positive  . COLONOSCOPY WITH PROPOFOL N/A 01/15/2017   Procedure: COLONOSCOPY WITH PROPOFOL;  Surgeon: Jonathon Bellows, MD;  Location: [facility] ENDOSCOPY;  Service: Gastroenterology;  Laterality: N/A;  . LAPAROSCOPIC  RIGHT COLON RESECTION  08/10   Dr Pat Patrick, for adenoca   . LOWER EXTREMITY ANGIOGRAPHY Left 10/07/2018   Procedure: Lower Extremity Angiography;  Surgeon: Algernon Huxley, MD;  Location: [place] CV LAB;  Service: Cardiovascular;  Laterality: Left;  . LOWER EXTREMITY ANGIOGRAPHY Left 10/21/2018   Procedure: Lower Extremity Angiography;  Surgeon: Algernon Huxley, MD;  Location: [place] CV LAB;  Service: Cardiovascular;  Laterality: Left;  Marland Kitchen MASTECTOMY Left 2015   BREAST CA  . OOPHORECTOMY    . ORIF FEMUR FRACTURE Right 07/21/2017   Procedure: OPEN REDUCTION INTERNAL FIXATION (ORIF) DISTAL FEMUR FRACTURE;  Surgeon: Lovell Sheehan, MD;  Location: [facility] ORS;  Service: Orthopedics;  Laterality: Right;  femur   . PACEMAKER INSERTION  2009  . REPLACEMENT TOTAL KNEE  1990's   right   . WOUND DEBRIDEMENT Left 10/22/2018   Procedure: DEBRIDEMENT WOUND;  Surgeon: Algernon Huxley, MD;  Location: [facility] ORS;  Service: General;  Laterality: Left;    Prior to Admission medications   Medication Sig Start Date End Date Taking? Authorizing Provider  acetaminophen (TYLENOL) 325 MG tablet Take 2 tablets (650 mg total) by mouth every 6 (six) hours as needed for mild pain (or temp > 37.5 C (99.5 F)). 10/08/18   Loletha Grayer, MD  alendronate (FOSAMAX) 70 MG tablet TAKE 1 TAB WEEKLY 30 MINS BEFORE BREAKFAST WITH A FULL GLASS OF WATER. DO NOT LIE DOWN FOR 30 MINS Patient taking differently: Take 70 mg by mouth once a week.  10/25/17   Lloyd Huger, MD  aspirin EC 81 MG EC tablet Take 1 tablet (81 mg total) by mouth daily. 10/08/18   Loletha Grayer, MD  atorvastatin (LIPITOR) 40 MG tablet Take 1 tablet (40 mg total) by mouth daily at 6 PM. 10/08/18   Loletha Grayer, MD  furosemide (LASIX) 20 MG tablet Take 40 mg (2 tablets) by mouth every morning, take 1 tablet (20 mg total) once in the afternoon. Patient taking differently: Take 1 tablet (20 mg total) once in the afternoon. 10/01/18   End, Harrell Gave, MD   isosorbide mononitrate (IMDUR) 30 MG 24 hr tablet Take 30 mg by mouth daily. 10/10/18   [provider]  metoprolol succinate (TOPROL-XL) 25 MG 24 hr tablet Take 3 tablets (75 mg total) by mouth daily. Take with or immediately following a meal. 11/03/18   End, Harrell Gave, MD  potassium chloride SA (K-DUR) 20 MEQ tablet Take 1 tablet (20 mEq total) by mouth daily. 09/29/18   End, Harrell Gave, MD  rivaroxaban (XARELTO) 15 MG TABS tablet Take 1 tablet (15 mg total) by mouth daily with supper. 09/23/18   End, Harrell Gave, MD  sacubitril-valsartan (ENTRESTO) 24-26 MG Take 1 tablet by mouth 2 (two) times daily. 10/06/18   Loletha Grayer, MD  senna-docusate (SENOKOT-S) 8.6-50 MG tablet Take 1 tablet by mouth at bedtime as needed for mild constipation or moderate constipation. 10/08/18   Wieting, Richard,  MD  traMADol (ULTRAM) 50 MG tablet Take 1 tablet (50 mg total) by mouth every 6 (six) hours as needed. 10/25/18   Henreitta Leber, MD    Allergies  Allergen Reactions  . No Known Allergies     Family History  Problem Relation Age of Onset  . Hypertension Father   . Alzheimer's disease Mother   . Breast cancer Sister 66  . Diabetes Other        brothers  . Stroke Brother   . Heart failure Brother   . Lupus Sister     Social History Social History   Tobacco Use  . Smoking status: Former Research scientist (life sciences)  . Smokeless tobacco: Never Used  . Tobacco comment: quit 2006  Substance Use Topics  . Alcohol use: No    Comment: former  . Drug use: No    Review of Systems Unable to obtain adequate/accurate review of systems largely due to patient being a very poor historian  ____________________________________________   PHYSICAL EXAM:  Constitutional: Patient is awake and alert, no acute distress, no obvious respiratory distress sitting in bed wearing a nonrebreather. Eyes: Normal exam ENT      Head: Normocephalic and atraumatic.      Mouth/Throat: Mucous membranes are  moist. Cardiovascular: Normal rate, regular rhythm.  Respiratory: Patient is moderately tachypneic, but no obvious wheeze rales or rhonchi. Gastrointestinal: Soft and nontender. No distention.   Musculoskeletal: 2+ lower extremity edema bilaterally, wounds to left lower extremity, both lower extremities are diffusely tender to palpation. Neurologic:  Normal speech and language. No gross focal neurologic deficits  Skin:  Skin is warm Psychiatric: Mood and affect are normal.   ____________________________________________    EKG  EKG viewed and interpreted by myself shows atrial fibrillation at 99 bpm with a slightly widened QRS, normal axis, nonspecific ST changes.  ____________________________________________    RADIOLOGY  Chest x-ray shows no significant change.  Cardiomegaly.  ____________________________________________   INITIAL IMPRESSION / ASSESSMENT AND PLAN / ED COURSE  Pertinent labs & imaging results that were available during my care of the patient were reviewed by me and considered in my medical decision making (see chart for details).   Patient presents to the emergency department for shortness of breath from a nursing facility.  Differential would include CHF exacerbation.  Patient does have lower extremity edema she also has what appears to be chronic appearing wounds to the left lower extremity.  Differential would also include pneumonia, pneumothorax, ACS or viral infection such as COVID-19.  We will check labs, chest x-ray, Covid swab, BNP and continue to closely monitor.   Labs have resulted showing significant BNP elevation of 4300, mild troponin elevation of 39.  Patient has significant leukocytosis of 23,000.  Unclear source at this time lactate is elevated greater than 3.  We will cover with broad-spectrum antibiotics and admit to the hospitalist for sepsis.  Patient does have wounds to the left lower extremity possible cause.   Julia Gordon was evaluated in  Emergency Department on 10/21/2018 for the symptoms described in the history of present illness. She was evaluated in the context of the global COVID-19 pandemic, which necessitated consideration that the patient might be at risk for infection with the SARS-CoV-2 virus that causes COVID-19. Institutional protocols and algorithms that pertain to the evaluation of patients at risk for COVID-19 are in a state of rapid change based on information released by regulatory bodies including the CDC and federal and state organizations. These policies and  algorithms were followed during the patient's care in the ED.  CRITICAL CARE Performed by: Harvest Dark   Total critical care time: 30 minutes  Critical care time was exclusive of separately billable procedures and treating other patients.  Critical care was necessary to treat or prevent imminent or life-threatening deterioration.  Critical care was time spent personally by me on the following activities: development of treatment plan with patient and/or surrogate as well as nursing, discussions with consultants, evaluation of patient's response to treatment, examination of patient, obtaining history from patient or surrogate, ordering and performing treatments and interventions, ordering and review of laboratory studies, ordering and review of radiographic studies, pulse oximetry and re-evaluation of patient's condition.   ____________________________________________   FINAL CLINICAL IMPRESSION(S) / ED DIAGNOSES  Dyspnea CHF exacerbation Sepsis   Harvest Dark, MD 10/15/2018 [place]

## 2018-11-05 NOTE — H&P (Signed)
[place] [facility] [place] NAME: Julia Gordon    MR#:  [id number]  DATE OF BIRTH:  Jun 02, 1934  DATE OF ADMISSION:  11/04/2018  PRIMARY CARE PHYSICIAN: Ellamae Sia, MD   REQUESTING/REFERRING PHYSICIAN: Dr. Kerman Passey  CHIEF COMPLAINT:  increasing shortness of breath patient is from [facility] on 10/20/ 2020  HISTORY OF PRESENT ILLNESS:  Julia Gordon  is a 84 y.o. female with a known history of COPD wears 2 L of oxygen 24 seven, hypertension, hyperlipidemia, CHF chronic systolic with EF of 25 to 30% presents to the emergency room from [facility] with increasing shortness of breath. Patient has been feeling short of breath they were unable to get his oxygen saturation at the nursing home. She was placed under not rebreather brought to the ER with sats in the 80s. Patient states her legs are painful and their swollen. She has chronic ulcers in her left lower extremity which was debrided during her stay in mid-October. This was done by vascular surgery.  Patient was sent to the nursing home with unna boot dressing change as per instructions given by the wound nurse.  She was found to have elevated lactic acid 23.6, tachycardia white count of 23,000 along with elevated creatinine. Patient is being admitted with sepsis appears to be acute on chronic leg ulcers to be the source.  She received IV vancomycin, cefepime and flagyl.  Patient is COVID-19 negative  PAST MEDICAL HISTORY:   Past Medical History:  Diagnosis Date  . Anemia   . Breast cancer ([place]) 2015   a. L mastectomy with chemoradiation  . Colon adenocarcinoma ([place])   . COPD (chronic obstructive pulmonary disease) ([place])   . Depression   . DJD (degenerative joint disease) of knee    right knee  . DVT (deep venous thrombosis) ([place])    a. 06/2018 L popliteal and R peroneal DVTs-->Xarelto.  . H/O hysterectomy with oophorectomy    for DUB  . HFrEF (heart  failure with reduced ejection fraction) ([place])    a. 06/2018 Echo: EF 25-30%.  . History of depression   . HLD (hyperlipidemia)   . HTN (hypertension)   . LV (left ventricular) mural thrombus    history of, resolved (echo 04/09)  . NICM (nonischemic cardiomyopathy) ([place])    a. Reports prior h/o cath @ [facility]; b. status post [place] with Guidant lead in 2009 at [facility], Dr. Boyd Kerbs; c. 2014 - prev seen by G. Lovena Le, MD; c. 06/2018 Echo: EF 25-30%, DD. Diff HK. RVSP 77mmHg. Mildly dil RA. Mild to mod MR. Mod TR. Mild AI.  Marland Kitchen Persistent atrial fibrillation ([place])    a. noted in [place] from 2008-2009; b. CHADS2VASc 6 (CHF, HTN, age x 2, vascular disease, female)-->eliquis added 06/2018 in setting of LLE DVT.  . Valvular heart disease    a. 06/2018 Echo: Mod TR, mild to mod MR, mild AI.    PAST SURGICAL HISTOIRY:   Past Surgical History:  Procedure Laterality Date  . ABDOMINAL HYSTERECTOMY    . BREAST BIOPSY Left 05/10/13   [place] bx-positive  . COLONOSCOPY WITH PROPOFOL N/A 01/15/2017   Procedure: COLONOSCOPY WITH PROPOFOL;  Surgeon: Jonathon Bellows, MD;  Location: [facility] ENDOSCOPY;  Service: Gastroenterology;  Laterality: N/A;  . LAPAROSCOPIC RIGHT COLON RESECTION  08/10   Dr Pat Patrick, for adenoca   . LOWER EXTREMITY ANGIOGRAPHY Left 10/07/2018   Procedure: Lower Extremity Angiography;  Surgeon: Algernon Huxley,  MD;  Location: [place] CV LAB;  Service: Cardiovascular;  Laterality: Left;  . LOWER EXTREMITY ANGIOGRAPHY Left 10/21/2018   Procedure: Lower Extremity Angiography;  Surgeon: Algernon Huxley, MD;  Location: [place] CV LAB;  Service: Cardiovascular;  Laterality: Left;  Marland Kitchen MASTECTOMY Left 2015   BREAST CA  . OOPHORECTOMY    . ORIF FEMUR FRACTURE Right 07/21/2017   Procedure: OPEN REDUCTION INTERNAL FIXATION (ORIF) DISTAL FEMUR FRACTURE;  Surgeon: Lovell Sheehan, MD;  Location: [facility] ORS;  Service: Orthopedics;  Laterality: Right;  femur   . PACEMAKER INSERTION  2009  . REPLACEMENT  TOTAL KNEE  1990's   right   . WOUND DEBRIDEMENT Left 10/22/2018   Procedure: DEBRIDEMENT WOUND;  Surgeon: Algernon Huxley, MD;  Location: [facility] ORS;  Service: General;  Laterality: Left;    SOCIAL HISTORY:   Social History   Tobacco Use  . Smoking status: Former Research scientist (life sciences)  . Smokeless tobacco: Never Used  . Tobacco comment: quit 2006  Substance Use Topics  . Alcohol use: No    Comment: former    FAMILY HISTORY:   Family History  Problem Relation Age of Onset  . Hypertension Father   . Alzheimer's disease Mother   . Breast cancer Sister 23  . Diabetes Other        brothers  . Stroke Brother   . Heart failure Brother   . Lupus Sister     DRUG ALLERGIES:   Allergies  Allergen Reactions  . No Known Allergies     REVIEW OF SYSTEMS:  Review of Systems  Constitutional: Negative for chills, fever and weight loss.  HENT: Negative for ear discharge, ear pain and nosebleeds.   Eyes: Negative for blurred vision, pain and discharge.  Respiratory: Positive for shortness of breath. Negative for sputum production, wheezing and stridor.   Cardiovascular: Positive for orthopnea, leg swelling and PND. Negative for chest pain and palpitations.  Gastrointestinal: Negative for abdominal pain, diarrhea, nausea and vomiting.  Genitourinary: Negative for frequency and urgency.  Musculoskeletal: Negative for back pain and joint pain.  Skin:       Chronic ulcers  Neurological: Positive for weakness. Negative for sensory change, speech change and focal weakness.  Psychiatric/Behavioral: Negative for depression and hallucinations. The patient is not nervous/anxious.      MEDICATIONS AT HOME:   Prior to Admission medications   Medication Sig Start Date End Date Taking? Authorizing Provider  acetaminophen (TYLENOL) 325 MG tablet Take 2 tablets (650 mg total) by mouth every 6 (six) hours as needed for mild pain (or temp > 37.5 C (99.5 F)). 10/08/18   Loletha Grayer, MD  alendronate (FOSAMAX)  70 MG tablet TAKE 1 TAB WEEKLY 30 MINS BEFORE BREAKFAST WITH A FULL GLASS OF WATER. DO NOT LIE DOWN FOR 30 MINS Patient taking differently: Take 70 mg by mouth once a week.  10/25/17   Lloyd Huger, MD  aspirin EC 81 MG EC tablet Take 1 tablet (81 mg total) by mouth daily. 10/08/18   Loletha Grayer, MD  atorvastatin (LIPITOR) 40 MG tablet Take 1 tablet (40 mg total) by mouth daily at 6 PM. 10/08/18   Loletha Grayer, MD  furosemide (LASIX) 20 MG tablet Take 40 mg (2 tablets) by mouth every morning, take 1 tablet (20 mg total) once in the afternoon. Patient taking differently: Take 1 tablet (20 mg total) once in the afternoon. 10/01/18   End, Harrell Gave, MD  isosorbide mononitrate (IMDUR) 30 MG 24 hr tablet  Take 30 mg by mouth daily. 10/10/18   [provider]  metoprolol succinate (TOPROL-XL) 25 MG 24 hr tablet Take 3 tablets (75 mg total) by mouth daily. Take with or immediately following a meal. 11/03/18   End, Harrell Gave, MD  potassium chloride SA (K-DUR) 20 MEQ tablet Take 1 tablet (20 mEq total) by mouth daily. 09/29/18   End, Harrell Gave, MD  rivaroxaban (XARELTO) 15 MG TABS tablet Take 1 tablet (15 mg total) by mouth daily with supper. 09/23/18   End, Harrell Gave, MD  sacubitril-valsartan (ENTRESTO) 24-26 MG Take 1 tablet by mouth 2 (two) times daily. 10/06/18   Loletha Grayer, MD  senna-docusate (SENOKOT-S) 8.6-50 MG tablet Take 1 tablet by mouth at bedtime as needed for mild constipation or moderate constipation. 10/08/18   Loletha Grayer, MD  traMADol (ULTRAM) 50 MG tablet Take 1 tablet (50 mg total) by mouth every 6 (six) hours as needed. 10/25/18   Henreitta Leber, MD      VITAL SIGNS:  Blood pressure 102/90, resp. rate 17, height 5\' 2"  (1.575 m), weight 77 kg.  PHYSICAL EXAMINATION:  GENERAL:  84 y.o.-year-old patient lying in the bed with no acute distress. Chronically ill anasarca EYES: Pupils equal, round, reactive to light and accommodation. No scleral  icterus. Extraocular muscles intact.  HEENT: Head atraumatic, normocephalic. Oropharynx and nasopharynx clear.  NECK:  Supple, no jugular venous distention. No thyroid enlargement, no tenderness.  LUNGS: decreased breath sounds bilaterally, no wheezing, rales,rhonchi or crepitation. No use of accessory muscles of respiration.  CARDIOVASCULAR: S1, S2 normal. No murmurs, rubs, or gallops. Mild tachycardia ABDOMEN: Soft, nontender, nondistended. Bowel sounds present. No organomegaly or mass.  EXTREMITIES: chronic leg ulcers with surrounding edema and cellulitis      NEUROLOGIC: Cranial nerves II through XII are intact. Muscle strength 5/5 in all extremities. Sensation intact. Gait not checked.  PSYCHIATRIC: The patient is alert and oriented x 3.  SKIN: No obvious rash, lesion, or ulcer.   LABORATORY PANEL:   CBC Recent Labs  Lab 11/03/2018 1642  WBC 23.6*  HGB 12.4  HCT 38.2  PLT 167   ------------------------------------------------------------------------------------------------------------------  Chemistries  Recent Labs  Lab 10/15/2018 1642  NA 136  K 4.8  CL 100  CO2 20*  GLUCOSE 88  BUN 39*  CREATININE 1.56*  CALCIUM 8.8*  AST 77*  ALT 36  ALKPHOS 84  BILITOT 3.4*   ------------------------------------------------------------------------------------------------------------------  Cardiac Enzymes No results for input(s): TROPONINI in the last 168 hours. ------------------------------------------------------------------------------------------------------------------  RADIOLOGY:  Dg Chest Portable 1 View  Result Date: 10/10/2018 CLINICAL DATA:  Shortness of breath EXAM: PORTABLE CHEST 1 VIEW COMPARISON:  10/16/2018 FINDINGS: No significant change in AP portable examination with elevation of the right hemidiaphragm. No acute appearing airspace opacity. Gross cardiomegaly with left chest pacer defibrillator, aortic atherosclerosis, and right chest port catheter.  IMPRESSION: 1. No significant change in AP portable examination with elevation of the right hemidiaphragm. No acute appearing airspace opacity. 2.  Gross cardiomegaly. Electronically Signed   By: Eddie Candle M.D.   On: 10/31/2018 16:57    EKG:    IMPRESSION AND PLAN:   Julia Gordon  is a 84 y.o. female with a known history of COPD wears 2 L of oxygen 24 seven, hypertension, hyperlipidemia, CHF chronic systolic with EF of 25 to 30% presents to the emergency room from [facility] with increasing shortness of breath. Patient has been feeling short of breath they were unable to get his oxygen saturation at the nursing  home. She was placed under not rebreather brought to the ER with sats in the 80s. Patient states her legs are painful and their swollen.  1. Sepsis suspected due to acute on chronic leg wound infection with cellulitis -patient came in with tachycardia, elevated lactic acid, elevated creatinine, elevated white count of 23,000 -IV broad-spectrum antibiotic -narrow down antibiotics once blood cultures are followed. -Wound consultation placed -continue IV vancomycin andcefepime -vascular consultation with Dr. Dianna Rossetti-- message sent -consider ID consultation if needed with help regarding antibiotic  2. Acute renal failure/ATN suspected in the setting of sepsis -I will give one dose of Lasix and thereafter hold see which with creatinine goes -avoid nephrotoxic's, monitor input output  3. Acute on chronic congestive heart failure with severe cardiomyopathy EF 25 to 30% -patient's blood pressure is a bit soft. Holding Lasix today. -Cardiology consultation with Dr. Harrell Gave--- haiku message sent -resume rest of cardiac meds once medication reconciliation completed  4. Leukocytosis suspected due to leg infection  5. Chronic atrial fibrillation rate controlled -continue low-dose Toprol -continue Xarelto  6. History of breast cancer colon cancer--- follow-up outpatient with  oncology  No Family in the ER.  Code status will need to be re-addressed with family. Patient was not able to give me much input regarding it.  Given multiple comorbidities, recurrent admission for similar issues consider palliative care consultation again  All the records are reviewed and case discussed with ED provider.   CODE STATUS: full (needs to be re-addressed with family)  TOTAL critical TIME TAKING CARE OF THIS PATIENT: 50 minutes.    Fritzi Mandes M.D on 10/27/2018 at 7:07 PM  Between 7am to 6pm - Pager - [phone]  After 6pm go to [email or web address] - password [email or web address]  [facility]  Office  [phone]  CC: Primary care physician; Ellamae Sia, MD

## 2018-11-06 ENCOUNTER — Other Ambulatory Visit: Payer: Self-pay

## 2018-11-06 LAB — BASIC METABOLIC PANEL
Anion gap: 17 — ABNORMAL HIGH (ref 5–15)
BUN: 45 mg/dL — ABNORMAL HIGH (ref 8–23)
CO2: 17 mmol/L — ABNORMAL LOW (ref 22–32)
Calcium: 8.3 mg/dL — ABNORMAL LOW (ref 8.9–10.3)
Chloride: 104 mmol/L (ref 98–111)
Creatinine, Ser: 1.62 mg/dL — ABNORMAL HIGH (ref 0.44–1.00)
GFR calc Af Amer: 34 mL/min — ABNORMAL LOW (ref >60)
GFR calc non Af Amer: 29 mL/min — ABNORMAL LOW (ref >60)
Glucose, Bld: 48 mg/dL — ABNORMAL LOW (ref 70–99)
Potassium: 4.8 mmol/L (ref 3.5–5.1)
Sodium: 138 mmol/L (ref 135–145)

## 2018-11-06 LAB — GLUCOSE, CAPILLARY
Glucose-Capillary: <10 mg/dL — CL (ref 70–99)
Glucose-Capillary: 16 mg/dL — CL (ref 70–99)
Glucose-Capillary: <10 mg/dL — CL (ref 70–99)
Glucose-Capillary: <10 mg/dL — CL (ref 70–99)
Glucose-Capillary: 59 mg/dL — ABNORMAL LOW (ref 70–99)
Glucose-Capillary: <10 mg/dL — CL (ref 70–99)

## 2018-11-06 LAB — BLOOD CULTURE ID PANEL (REFLEXED)

## 2018-11-06 LAB — URINE CULTURE

## 2018-11-06 LAB — LACTIC ACID, PLASMA: Lactic Acid, Venous: 6.1 mmol/L (ref 0.5–1.9)

## 2018-11-06 MED ORDER — DEXTROSE 50 % IV SOLN
INTRAVENOUS | Status: AC
  Administered 2018-11-06: 50 mL via INTRAVENOUS
  Filled 2018-11-06: qty 50

## 2018-11-06 MED ORDER — DEXTROSE 50 % IV SOLN
1.0000 | Freq: Once | INTRAVENOUS | Status: AC
  Administered 2018-11-06: 06:00:00 50 mL via INTRAVENOUS

## 2018-11-06 MED ORDER — DEXTROSE 50 % IV SOLN
1.0000 | Freq: Once | INTRAVENOUS | Status: DC
  Filled 2018-11-06: qty 50

## 2018-11-06 MED ORDER — MORPHINE SULFATE (PF) 2 MG/ML IV SOLN: 2.0000 mg | INTRAVENOUS | Status: DC | PRN

## 2018-11-06 MED ORDER — DEXTROSE 10 % IV SOLN
INTRAVENOUS | Status: DC
  Administered 2018-11-06: 07:00:00 via INTRAVENOUS

## 2018-11-06 MED ORDER — CHLORHEXIDINE GLUCONATE CLOTH 2 % EX PADS: 6.0000 | MEDICATED_PAD | Freq: Every day | CUTANEOUS | Status: DC

## 2018-11-06 MED ORDER — LORAZEPAM 2 MG/ML IJ SOLN
1.0000 mg | INTRAMUSCULAR | Status: DC | PRN
  Administered 2018-11-06: 1 mg via INTRAVENOUS
  Filled 2018-11-06: qty 1

## 2018-11-06 MED ORDER — DEXTROSE 50 % IV SOLN
1.0000 | Freq: Once | INTRAVENOUS | Status: AC
  Administered 2018-11-06: 08:00:00 50 mL via INTRAVENOUS

## 2018-11-06 MED ORDER — MORPHINE SULFATE (CONCENTRATE) 10 MG/0.5ML PO SOLN: 5.0000 mg | ORAL | Status: DC | PRN

## 2018-11-06 MED ORDER — DEXTROSE 50 % IV SOLN
1.0000 | Freq: Once | INTRAVENOUS | Status: AC
  Administered 2018-11-06: 07:00:00 50 mL via INTRAVENOUS

## 2018-11-06 MED ORDER — GLYCOPYRROLATE 0.2 MG/ML IJ SOLN
0.1000 mg | INTRAMUSCULAR | Status: DC | PRN
  Administered 2018-11-08: 11:00:00 0.1 mg via INTRAVENOUS
  Filled 2018-11-06 (×3): qty 0.5

## 2018-11-06 MED ORDER — GLUCOSE 40 % PO GEL
1.0000 | Freq: Once | ORAL | Status: AC
  Administered 2018-11-06: 37.5 g via ORAL

## 2018-11-06 MED ORDER — DEXTROSE 50 % IV SOLN
INTRAVENOUS | Status: AC
  Filled 2018-11-06: qty 50

## 2018-11-06 MED ORDER — DEXTROSE 10 % IV SOLN: INTRAVENOUS | Status: DC

## 2018-11-06 NOTE — Progress Notes (Signed)
Repeat lactic 5.7. MD made aware, will monitor for fluid overload. [place] sounds clear. Will recheck again in the a.m.

## 2018-11-06 NOTE — Progress Notes (Addendum)
This nurse noticed glucose of 48 in a.m. lab values. CBG check showed <10 x 2. Pt has not noticeable signs of hypoglycemia. Rectal temp is 95. 5 am lactic is 6.1. MD Marcille Blanco made aware. One amp D50 ordered. Bair hugger ordered. Pt also given 8 oz orange juice. Will recheck in 15 minutes

## 2018-11-06 NOTE — Progress Notes (Signed)
MD Mayo and Marcille Blanco made aware of pt CBG. Pt given a tube of dextrose oral gel and two more amps of D50 given. Pt last CBG 59. Will recheck again.

## 2018-11-06 NOTE — Progress Notes (Signed)
PHARMACY - PHYSICIAN COMMUNICATION CRITICAL VALUE ALERT - BLOOD CULTURE IDENTIFICATION (BCID)  Results for orders placed or performed during the hospital encounter of 10/29/2018  Blood Culture ID Panel (Reflexed) (Collected: 10/19/2018  4:46 PM)  Result Value Ref Range   Enterococcus species NOT DETECTED NOT DETECTED   Listeria monocytogenes NOT DETECTED NOT DETECTED   Staphylococcus species NOT DETECTED NOT DETECTED   Staphylococcus aureus (BCID) NOT DETECTED NOT DETECTED   Streptococcus species NOT DETECTED NOT DETECTED   Streptococcus agalactiae NOT DETECTED NOT DETECTED   Streptococcus pneumoniae NOT DETECTED NOT DETECTED   Streptococcus pyogenes NOT DETECTED NOT DETECTED   Acinetobacter baumannii NOT DETECTED NOT DETECTED   Enterobacteriaceae species DETECTED (A) NOT DETECTED   Enterobacter cloacae complex NOT DETECTED NOT DETECTED   Escherichia coli NOT DETECTED NOT DETECTED   Klebsiella oxytoca NOT DETECTED NOT DETECTED   Klebsiella pneumoniae NOT DETECTED NOT DETECTED   Proteus species NOT DETECTED NOT DETECTED   Serratia marcescens DETECTED (A) NOT DETECTED   Carbapenem resistance NOT DETECTED NOT DETECTED   Haemophilus influenzae NOT DETECTED NOT DETECTED   Neisseria meningitidis NOT DETECTED NOT DETECTED   Pseudomonas aeruginosa NOT DETECTED NOT DETECTED   Candida albicans NOT DETECTED NOT DETECTED   Candida glabrata NOT DETECTED NOT DETECTED   Candida krusei NOT DETECTED NOT DETECTED   Candida parapsilosis NOT DETECTED NOT DETECTED   Candida tropicalis NOT DETECTED NOT DETECTED   1/4 S marscescens  Name of physician (or Provider) Contacted: Dr Brett Albino  Changes to prescribed antibiotics required: none  This patient has been transitioned to full comfort care  Dallie Piles, PharmD 11/06/2018  9:15 AM

## 2018-11-06 NOTE — Progress Notes (Signed)
[place] [facility] [place] NAME: Julia Gordon    MR#:  [id number]  DATE OF BIRTH:  [birth year]  SUBJECTIVE:   Patient has been declining overnight. She remains hypothermic and hypotension. Blood sugars are persistently <10 despite receiving multiple amps of D50. Lactic acid continues to rise. I had a goals of care discussion with patient's son, Julia Gordon, and daughter, Julia Gordon, this morning and they stated that they would not want their mother to suffer. They would like for her to be made DNR and transitioned to full comfort care.  REVIEW OF SYSTEMS:  ROS- unable to obtain due to AMS  DRUG ALLERGIES:   Allergies  Allergen Reactions  . No Known Allergies    VITALS:  Blood pressure 94/72, pulse 87, temperature (!) 95.2 F (35.1 C), resp. rate 20, height 5\' 2"  (1.575 m), weight 77 kg, SpO2 94 %. PHYSICAL EXAMINATION:  Physical Exam  GENERAL:  Laying in the bed, appears mildly uncomfortable, occasionally groaning. HEENT: Head atraumatic, normocephalic. Pupils equal, round, reactive to light and accommodation. No scleral icterus. Extraocular muscles intact. Oropharynx and nasopharynx clear.  NECK:  Supple, no jugular venous distention. No thyroid enlargement. LUNGS: Lungs are clear to auscultation bilaterally. No wheezes, crackles, rhonchi. No use of accessory muscles of respiration.  CARDIOVASCULAR: RRR, S1, S2 normal. No murmurs, rubs, or gallops.  ABDOMEN: Soft, nontender, nondistended. Bowel sounds present.  EXTREMITIES: No pedal edema, cyanosis, or clubbing. +extremities are cool to the touch. NEUROLOGIC: unable to assess- patient is lethargic PSYCHIATRIC: unable to assess SKIN: +chronic wounds to the lower extremities bilaterally. LABORATORY PANEL:  Female CBC Recent Labs  Lab 11/01/2018 1642  WBC 23.6*  HGB 12.4  HCT 38.2  PLT 167    ------------------------------------------------------------------------------------------------------------------ Chemistries  Recent Labs  Lab 11/01/2018 1642 11/06/18 0530  NA 136 138  K 4.8 4.8  CL 100 104  CO2 20* 17*  GLUCOSE 88 48*  BUN 39* 45*  CREATININE 1.56* 1.62*  CALCIUM 8.8* 8.3*  AST 77*  --   ALT 36  --   ALKPHOS 84  --   BILITOT 3.4*  --    RADIOLOGY:  Dg Chest Portable 1 View  Result Date: 10/31/2018 CLINICAL DATA:  Shortness of breath EXAM: PORTABLE CHEST 1 VIEW COMPARISON:  10/16/2018 FINDINGS: No significant change in AP portable examination with elevation of the right hemidiaphragm. No acute appearing airspace opacity. Gross cardiomegaly with left chest pacer defibrillator, aortic atherosclerosis, and right chest port catheter. IMPRESSION: 1. No significant change in AP portable examination with elevation of the right hemidiaphragm. No acute appearing airspace opacity. 2.  Gross cardiomegaly. Electronically Signed   By: Eddie Candle M.D.   On: 11/01/2018 16:57   ASSESSMENT AND PLAN:   Sepsis suspected due to acute on chronic leg wound infection with cellulitis- clinically worsening overnight. Lactic acidosis is worsening. -I had a goals of care discussion with patient's son and daughter this morning- plan is to transition to full comfort care -Stop abx -Add comfort meds -Code status has been changed to DNR  All the records are reviewed and case discussed with Care Management/Social Worker. Management plans discussed with the patient, family and they are in agreement.  CODE STATUS: DNR  TOTAL TIME TAKING CARE OF THIS PATIENT: 40 minutes.   More than 50% of the time was spent in counseling/coordination of care: YES  POSSIBLE D/C unknown, DEPENDING ON CLINICAL CONDITION.   Berna Spare Mayo M.D on 11/06/2018 at 9:01 AM  Between 7am to 6pm - Pager - [phone]  After 6pm go to [email or web address] - Proofreader  [facility]   Office  [phone]  CC: Primary care physician; Julia Sia, MD  Note: This dictation was prepared with Dragon dictation along with smaller phrase technology. Any transcriptional errors that result from this process are unintentional.

## 2018-11-06 NOTE — Progress Notes (Signed)
Family Meeting Note  Advance Directive:no  Today a meeting took place with the son and daughter.  Patient is unable to participate due [id number] capacity and AMS   The following clinical team members were present during this meeting:MD  The following were discussed:Patient's diagnosis: sepsis, Patient's progosis: < 2 weeks and Goals for treatment: DNR   I had a very long goals of care discussion with patient's son, [name], and patient's daughter, [name], over the phone and in person today.  Patient's son and daughter state that she has been through a lot over the last couple of months.  She has been "poked and prodded" and has been steadily declining.  They state that their mom would not want to continue to pursue aggressive medical intervention.  They would like for her to be changed to DNR.  They would like for her to be full comfort care at this time.  They would not want patient to undergo further surgical intervention for her legs.  Additional follow-up to be provided: prn  Time spent during discussion:20 minutes  Evette Doffing, MD

## 2018-11-07 ENCOUNTER — Encounter (INDEPENDENT_AMBULATORY_CARE_PROVIDER_SITE_OTHER): Payer: Self-pay | Admitting: Nurse Practitioner

## 2018-11-07 DIAGNOSIS — A419 Sepsis, unspecified organism: Principal | ICD-10-CM

## 2018-11-07 NOTE — Plan of Care (Signed)
  Problem: Education: Goal: Knowledge of General Education information will improve Description: Including pain rating scale, medication(s)/side effects and non-pharmacologic comfort measures Outcome: Not Met (add Reason)   Problem: Health Behavior/Discharge Planning: Goal: Ability to manage health-related needs will improve Outcome: Not Met (add Reason)   Problem: Clinical Measurements: Goal: Ability to maintain clinical measurements within normal limits will improve Outcome: Not Met (add Reason) Goal: Will remain free from infection Outcome: Not Met (add Reason) Goal: Diagnostic test results will improve Outcome: Not Met (add Reason) Goal: Respiratory complications will improve Outcome: Not Met (add Reason) Goal: Cardiovascular complication will be avoided Outcome: Not Met (add Reason)   Problem: Activity: Goal: Risk for activity intolerance will decrease Outcome: Not Met (add Reason)   Problem: Nutrition: Goal: Adequate nutrition will be maintained Outcome: Not Met (add Reason)   Problem: Coping: Goal: Level of anxiety will decrease Outcome: Not Met (add Reason)   Problem: Elimination: Goal: Will not experience complications related to bowel motility Outcome: Not Met (add Reason) Goal: Will not experience complications related to urinary retention Outcome: Not Met (add Reason)   Problem: Safety: Goal: Ability to remain free from injury will improve Outcome: Not Met (add Reason)   Problem: Skin Integrity: Goal: Risk for impaired skin integrity will decrease Outcome: Not Met (add Reason)   Patient is comfort care

## 2018-11-07 NOTE — Progress Notes (Signed)
RN returned call to daughter [name] for an update. [name] states she already got an update from the brothers that are at bedside. I will continue to assess.

## 2018-11-07 NOTE — Progress Notes (Signed)
SUBJECTIVE:  Patient ID: Julia Gordon, female    DOB: [birth year], 84 y.o.   MRN: [id number] Chief Complaint  Patient presents with  . Follow-up    [facility] 1week left wound check    HPI  Julia Gordon is a 84 y.o. female that presents today with multiple wounds of the left lower extremity due to necrotic eschar.  The patient previously underwent revascularization of the left lower extremity.  She underwent debridement on 10/22/2018 to try to promote wound healing.  Today, the patient presents today with her daughter to evaluate the lower extremity wound.  Today the wounds have some good granulation tissue.  However the patient's daughter notes that the patient continues to complain of extreme pain.  The patient also does note that her leg hurts from the wounds.  It is also noted that her pain medication at her facility is listed as as needed versus being scheduled.  They deny any fever, chills, nausea, vomiting or diarrhea.  They deny chest pain shortness of breath.  Past Medical History:  Diagnosis Date  . Anemia   . Breast cancer ([place]) 2015   a. L mastectomy with chemoradiation  . Colon adenocarcinoma ([place])   . COPD (chronic obstructive pulmonary disease) ([place])   . Depression   . DJD (degenerative joint disease) of knee    right knee  . DVT (deep venous thrombosis) ([place])    a. 06/2018 L popliteal and R peroneal DVTs-->Xarelto.  . H/O hysterectomy with oophorectomy    for DUB  . HFrEF (heart failure with reduced ejection fraction) ([place])    a. 06/2018 Echo: EF 25-30%.  . History of depression   . HLD (hyperlipidemia)   . HTN (hypertension)   . LV (left ventricular) mural thrombus    history of, resolved (echo 04/09)  . NICM (nonischemic cardiomyopathy) ([place])    a. Reports prior h/o cath @ [facility]; b. status post [place] with [employer] lead in 2009 at [facility], Dr. Boyd Kerbs; c. 2014 - prev seen by G. Lovena Le, MD; c. 06/2018 Echo: EF 25-30%, DD. Diff HK. RVSP 47mmHg. Mildly dil RA. Mild to  mod MR. Mod TR. Mild AI.  Marland Kitchen Persistent atrial fibrillation ([place])    a. noted in [place] from 2008-2009; b. CHADS2VASc 6 (CHF, HTN, age x 2, vascular disease, female)-->eliquis added 06/2018 in setting of LLE DVT.  . Valvular heart disease    a. 06/2018 Echo: Mod TR, mild to mod MR, mild AI.    Past Surgical History:  Procedure Laterality Date  . ABDOMINAL HYSTERECTOMY    . BREAST BIOPSY Left 05/10/13   [place] bx-positive  . COLONOSCOPY WITH PROPOFOL N/A 01/15/2017   Procedure: COLONOSCOPY WITH PROPOFOL;  Surgeon: Jonathon Bellows, MD;  [facility] ENDOSCOPY;  Service: Gastroenterology;  Laterality: N/A;  . LAPAROSCOPIC RIGHT COLON RESECTION  08/10   Dr Pat Patrick, for adenoca   . LOWER EXTREMITY ANGIOGRAPHY Left 10/07/2018   Procedure: Lower Extremity Angiography;  Surgeon: Algernon Huxley, MD;  Location: [place] CV LAB;  Service: Cardiovascular;  Laterality: Left;  . LOWER EXTREMITY ANGIOGRAPHY Left 10/21/2018   Procedure: Lower Extremity Angiography;  Surgeon: Algernon Huxley, MD;  Location: [place] CV LAB;  Service: Cardiovascular;  Laterality: Left;  Marland Kitchen MASTECTOMY Left 2015   BREAST CA  . OOPHORECTOMY    . ORIF FEMUR FRACTURE Right 07/21/2017   Procedure: OPEN REDUCTION INTERNAL FIXATION (ORIF) DISTAL FEMUR FRACTURE;  Surgeon: Lovell Sheehan, MD;  Location: [facility] ORS;  Service: Orthopedics;  Laterality: Right;  femur   . PACEMAKER INSERTION  2009  . REPLACEMENT TOTAL KNEE  1990's   right   . WOUND DEBRIDEMENT Left 10/22/2018   Procedure: DEBRIDEMENT WOUND;  Surgeon: Algernon Huxley, MD;  Location: [facility] ORS;  Service: General;  Laterality: Left;    Social History   Socioeconomic History  . Marital status: Married    Spouse name: Not on file  . Number of children: Not on file  . Years of education: Not on file  . Highest education level: Not on file  Occupational History  . 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 Financial resource strain: Not on file  . Food insecurity    Worry: Not on file    Inability: Not on file  . Transportation needs    Medical: Not on file    Non-medical: Not on file  Tobacco Use  . Smoking status: Former Research scientist (life sciences)  . Smokeless tobacco: Never Used  . Tobacco comment: quit 2006  Substance and Sexual Activity  . Alcohol use: No    Comment: former  . Drug use: No  . Sexual activity: Not on file  Lifestyle  . Physical activity    Days per week: Not on file    Minutes per session: Not on file  . Stress: Not on file  Relationships  . Social Herbalist on phone: Not on file    Gets together: Not on file    Attends religious service: Not on file    Active member of club or organization: Not on file    Attends meetings of clubs or organizations: Not on file    Relationship status: Not on file  . Intimate partner violence    Fear of current or ex partner: Not on file    Emotionally abused: Not on file    Physically abused: Not on file    Forced sexual activity: Not on file  Other Topics Concern  . Not on file  Social History Narrative  . Not on file    Family History  Problem Relation Age of Onset  . Hypertension Father   . Alzheimer's disease Mother   . Breast cancer Sister 81  . Diabetes Other        brothers  . Stroke Brother   . Heart failure Brother   . Lupus Sister     Allergies  Allergen Reactions  . No Known Allergies      Review of Systems   Review of Systems: Negative Unless Checked Constitutional: [] Weight loss  [] Fever  [] Chills Cardiac: [] Chest pain   []  Atrial Fibrillation  [] Palpitations   [] Shortness of breath when laying flat   [] Shortness of breath with exertion. [] Shortness of breath at rest Vascular:  [] Pain in legs with walking   [] Pain in legs with standing [] Pain in legs when laying flat   [] Claudication    [] Pain in feet when laying flat    [] History of DVT   [] Phlebitis   [] Swelling in legs   [] Varicose  veins   [x] Non-healing ulcers Pulmonary:   [] Uses home oxygen   [] Productive cough   [] Hemoptysis   [] Wheeze  [] COPD   [] Asthma Neurologic:  [] Dizziness   [] Seizures  [] Blackouts [x] History of stroke   [] History of TIA  [] Aphasia   [] Temporary Blindness   [] Weakness or numbness in arm   [x] Weakness or  numbness in leg Musculoskeletal:   [] Joint swelling   [] Joint pain   [] Low back pain  []  History of Knee Replacement [] Arthritis [] back Surgeries  []  Spinal Stenosis    Hematologic:  [] Easy bruising  [] Easy bleeding   [] Hypercoagulable state   [] Anemic Gastrointestinal:  [] Diarrhea   [] Vomiting  [] Gastroesophageal reflux/heartburn   [] Difficulty swallowing. [] Abdominal pain Genitourinary:  [] Chronic kidney disease   [] Difficult urination  [] Anuric   [] Blood in urine [] Frequent urination  [] Burning with urination   [] Hematuria Skin:  [] Rashes   [] Ulcers [x] Wounds Psychological:  [] History of anxiety   []  History of major depression  [x]  Memory Difficulties      OBJECTIVE:   Physical Exam  BP 112/81 (BP Location: Right Arm)   Pulse 98   Resp 16   Gen: WD/WN, NAD Head: [place]/AT, No temporalis wasting.  Ear/Nose/Throat: Hearing grossly intact, nares w/o erythema or drainage Eyes: PER, EOMI, sclera nonicteric.  Neck: Supple, no masses.  No JVD.  Pulmonary:  Good air movement, no use of accessory muscles.  Cardiac: RRR Vascular:  Multiple wounds left lower extremity see attached pictures. Vessel Right Left  Radial Palpable Palpable   Gastrointestinal: soft, non-distended. No guarding/no peritoneal signs.  Musculoskeletal: Patient wheelchair-bound.  No deformity or atrophy.  Neurologic: Pain and light touch intact in extremities.  Symmetrical.  Speech is fluent. Motor exam as listed above. Psychiatric: Judgment intact, Mood & affect appropriate for pt's clinical situation. Dermatologic: No Venous rashes. No Ulcers Noted.  No changes consistent with cellulitis. Lymph : No Cervical  lymphadenopathy, no lichenification or skin changes of chronic lymphedema.             ASSESSMENT AND PLAN:  1. Leg wound, left, sequela We will place Aquacel Ag at the patient's wounds with direction to have her dressings changed twice a week by her home facility.  In addition to placing Aquacel on the wounds the patient will have a modified wrap placed.  Using Kerlix and Coban to help control the edema.  We will have the patient return in 2 to 3 weeks to evaluate lower extremity wounds and edema.  We also sent instructions as to what the facility should look for with concerning infection or worsening of wound.  The facility is also instructed to bring the patient's pain medication from as needed to a 6 hours scheduled medication to see if this assists with the patient's pain.  2. Essential hypertension Continue antihypertensive medications as already ordered, these medications have been reviewed and there are no changes at this time.   3. Dyslipidemia Continue statin as ordered and reviewed, no changes at this time   4. Lymphedema The patient's lower extremity edema will worsen the patient's wounds, therefore we will place her in a modified route that is outlined above.  The patient should also elevate her lower extremities as much as possible.  The patient is currently too weak to ambulate at this time.   No current facility-administered medications on file prior to visit.    Current Outpatient Medications on File Prior to Visit  Medication Sig Dispense Refill  . acetaminophen (TYLENOL) 325 MG tablet Take 2 tablets (650 mg total) by mouth every 6 (six) hours as needed for mild pain (or temp > 37.5 C (99.5 F)).    Marland Kitchen alendronate (FOSAMAX) 70 MG tablet TAKE 1 TAB WEEKLY 30 MINS BEFORE BREAKFAST WITH A FULL GLASS OF WATER. DO NOT LIE DOWN FOR 30 MINS (Patient taking differently: Take 70 mg by  mouth once a week. ) 12 tablet 3  . aspirin EC 81 MG EC tablet Take 1 tablet (81 mg total) by  mouth daily. 30 tablet 0  . atorvastatin (LIPITOR) 40 MG tablet Take 1 tablet (40 mg total) by mouth daily at 6 PM. 30 tablet 0  . furosemide (LASIX) 20 MG tablet Take 40 mg (2 tablets) by mouth every morning, take 1 tablet (20 mg total) once in the afternoon. (Patient taking differently: Take 20-40 mg by mouth 2 (two) times daily. Take two tablets (40mg ) in the morning, take 1 tablet (20 mg total) once in the afternoon, as needed) 90 tablet 6  . isosorbide mononitrate (IMDUR) 30 MG 24 hr tablet Take 30 mg by mouth daily.    . potassium chloride SA (K-DUR) 20 MEQ tablet Take 1 tablet (20 mEq total) by mouth daily. 90 tablet 3  . rivaroxaban (XARELTO) 15 MG TABS tablet Take 1 tablet (15 mg total) by mouth daily with supper. 30 tablet 6  . sacubitril-valsartan (ENTRESTO) 24-26 MG Take 1 tablet by mouth 2 (two) times daily. 60 tablet 0  . senna-docusate (SENOKOT-S) 8.6-50 MG tablet Take 1 tablet by mouth at bedtime as needed for mild constipation or moderate constipation. 30 tablet 0  . traMADol (ULTRAM) 50 MG tablet Take 1 tablet (50 mg total) by mouth every 6 (six) hours as needed. 20 tablet 0    There are no Patient Instructions on file for this visit. No follow-ups on file.   Kris Hartmann, NP  This note was completed with Sales executive.  Any errors are purely unintentional.

## 2018-11-07 NOTE — Progress Notes (Signed)
Progress Note    Julia Gordon  [id number] DOB: 1934-08-06  DOA: 10/16/2018 PCP: Ellamae Sia, MD    Brief Narrative:    Medical records reviewed and are as summarized below:  Julia Gordon is an 84 y.o. female   Assessment/Plan:   Active Problems:   Sepsis ([place])   Body mass index is 31.73 kg/m.   Sepsis probably due to cellulitis on bilateral legs (right greater than left): Patient is now doing very well and comfort care was initiated yesterday, 11/06/2018.  Continue comfort care.  Analgesics as needed for pain. Plan of care was discussed with Tammy, RN.  Plan of care was also discussed with patient's family at the bedside.   Family Communication/Anticipated D/C date and plan/Code Status   DVT prophylaxis: Comfort care Code Status: DNR.  Family Communication: Plan of care was discussed with her son, June, and her cousin at the bedside.  There was also another cousin on speaker phone Disposition Plan: Discharge home with hospice likely tomorrow   Medical Consultants:    None.   Anti-Infectives:    None  Subjective:   Patient is confused and lethargic, and unable to provide any information  Objective:    Vitals:   10/30/2018 2201 10/11/2018 2246 11/06/18 0443 11/07/18 0500  BP: 97/83 (!) 88/76 94/72   Pulse: 94 88 87   Resp: 16 19 20    Temp:  97.7 F (36.5 C) (!) 95.2 F (35.1 C)   TempSrc:  Oral    SpO2: 97%  94%   Weight:    78.7 kg  Height:        Intake/Output Summary (Last 24 hours) at 11/07/2018 1337 Last data filed at 11/06/2018 1855 Gross per 24 hour  Intake -  Output 0 ml  Net 0 ml   Filed Weights   10/17/2018 1636 11/07/18 0500  Weight: 77 kg 78.7 kg    Exam: General Appearance:   Lethargic, confused, no acute distress  Lungs:     Clear to auscultation bilaterally, respirations unlabored   Heart:    Regular rate and rhythm, S1 and S2 normal, no murmur, rub   or gallop  Abdomen:     Soft, non-tender, bowel sounds  active ,    no masses, no organomegaly  Extremities:  Chronic wounds on bilateral legs.  She has large blister under right leg.  Tenderness on bilateral legs.  Some erythema and swelling on right leg.  Both hands and feet are cold especially the feet.  Pulses:  Unable to palpate any pulses in the feet/ dorsalis pedis arteries     Data Reviewed:   I have personally reviewed following labs and imaging studies:  Labs: Labs show the following:   Basic Metabolic Panel: Recent Labs  Lab 10/24/2018 1642 11/06/18 0530  NA 136 138  K 4.8 4.8  CL 100 104  CO2 20* 17*  GLUCOSE 88 48*  BUN 39* 45*  CREATININE 1.56* 1.62*  CALCIUM 8.8* 8.3*   GFR Estimated Creatinine Clearance: 26 mL/min (A) (by C-G formula based on SCr of 1.62 mg/dL (H)). Liver Function Tests: Recent Labs  Lab 10/23/2018 1642  AST 77*  ALT 36  ALKPHOS 84  BILITOT 3.4*  PROT 6.6  ALBUMIN 3.0*   No results for input(s): LIPASE, AMYLASE in the last 168 hours. No results for input(s): AMMONIA in the last 168 hours. Coagulation profile Recent Labs  Lab 10/30/2018 1643  INR 2.1*    CBC:  Recent Labs  Lab 11/02/2018 1642  WBC 23.6*  HGB 12.4  HCT 38.2  MCV 94.3  PLT 167   Cardiac Enzymes: No results for input(s): CKTOTAL, CKMB, CKMBINDEX, TROPONINI in the last 168 hours. BNP (last 3 results) No results for input(s): PROBNP in the last 8760 hours. CBG: Recent Labs  Lab 11/06/18 0646 11/06/18 0706 11/06/18 0728 11/06/18 0801 11/06/18 0838  GLUCAP 16* <10* <10* 59* <10*   D-Dimer: No results for input(s): DDIMER in the last 72 hours. Hgb A1c: No results for input(s): HGBA1C in the last 72 hours. Lipid Profile: No results for input(s): CHOL, HDL, LDLCALC, TRIG, CHOLHDL, LDLDIRECT in the last 72 hours. Thyroid function studies: No results for input(s): TSH, T4TOTAL, T3FREE, THYROIDAB in the last 72 hours.  Invalid input(s): FREET3 Anemia work up: No results for input(s): VITAMINB12, FOLATE,  FERRITIN, TIBC, IRON, RETICCTPCT in the last 72 hours. Sepsis Labs: Recent Labs  Lab 10/24/2018 1642 10/07/2018 1645 10/08/2018 2248 11/06/18 0530  WBC 23.6*  --   --   --   LATICACIDVEN  --  3.9* 5.7* 6.1*    Microbiology Recent Results (from the past 240 hour(s))  SARS Coronavirus 2 by RT PCR (hospital order, performed in [facility] hospital lab) Nasopharyngeal Nasopharyngeal Swab     Status: None   Collection Time: 10/27/2018  4:43 PM   Specimen: Nasopharyngeal Swab  Result Value Ref Range Status   SARS Coronavirus 2 NEGATIVE NEGATIVE Final    Comment: (NOTE) If result is NEGATIVE SARS-CoV-2 target nucleic acids are NOT DETECTED. The SARS-CoV-2 RNA is generally detectable in upper and lower  respiratory specimens during the acute phase of infection. The lowest  concentration of SARS-CoV-2 viral copies this assay can detect is 250  copies / mL. A negative result does not preclude SARS-CoV-2 infection  and should not be used as the sole basis for treatment or other  patient management decisions.  A negative result may occur with  improper specimen collection / handling, submission of specimen other  than nasopharyngeal swab, presence of viral mutation(s) within the  areas targeted by this assay, and inadequate number of viral copies  (<250 copies / mL). A negative result must be combined with clinical  observations, patient history, and epidemiological information. If result is POSITIVE SARS-CoV-2 target nucleic acids are DETECTED. The SARS-CoV-2 RNA is generally detectable in upper and lower  respiratory specimens dur ing the acute phase of infection.  Positive  results are indicative of active infection with SARS-CoV-2.  Clinical  correlation with patient history and other diagnostic information is  necessary to determine patient infection status.  Positive results do  not rule out bacterial infection or co-infection with other viruses. If result is PRESUMPTIVE POSTIVE  SARS-CoV-2 nucleic acids MAY BE PRESENT.   A presumptive positive result was obtained on the submitted specimen  and confirmed on repeat testing.  While 2019 novel coronavirus  (SARS-CoV-2) nucleic acids may be present in the submitted sample  additional confirmatory testing may be necessary for epidemiological  and / or clinical management purposes  to differentiate between  SARS-CoV-2 and other Sarbecovirus currently known to infect humans.  If clinically indicated additional testing with an alternate test  methodology [phone]) is advised. The SARS-CoV-2 RNA is generally  detectable in upper and lower respiratory sp ecimens during the acute  phase of infection. The expected result is Negative. Fact Sheet for Patients:  StrictlyIdeas.no Fact Sheet for Healthcare Providers: BankingDealers.co.za This test is not yet approved or  cleared by the [place] and has been authorized for detection and/or diagnosis of SARS-CoV-2 by FDA under an Emergency Use Authorization (EUA).  This EUA will remain in effect (meaning this test can be used) for the duration of the COVID-19 declaration under Section 564(b)(1) of the Act, 21 U.S.C. section 360bbb-3(b)(1), unless the authorization is terminated or revoked sooner. Performed at [facility], [address] [place], [place] [address]   Blood culture (routine x 2)     Status: Abnormal (Preliminary result)   Collection Time: 10/27/2018  4:46 PM   Specimen: BLOOD  Result Value Ref Range Status   Specimen Description   Final    BLOOD RIGHT PORTA CATH Performed at [facility], [place] [address], [place], [place] [address]    Special Requests   Final    BOTTLES DRAWN AEROBIC AND ANAEROBIC Blood Culture results may not be optimal due to an excessive volume of blood received in culture bottles Performed at [facility], [address] [place], [place] [address]    Culture   Setup Time   Final    Organism ID to follow [place] CRITICAL RESULT CALLED TO, READ BACK BY AND VERIFIED WITH: RODNEY GRUBB AT [id number] 11/06/2018.PMF Performed at [facility], [address] [place], [place] [address]    Culture (A)  Final    SERRATIA MARCESCENS SUSCEPTIBILITIES TO FOLLOW Performed at [facility], [place] [address] [place], [place] [address]    Report Status PENDING  Incomplete  Blood Culture ID Panel (Reflexed)     Status: Abnormal   Collection Time: 10/23/2018  4:46 PM  Result Value Ref Range Status   Enterococcus species NOT DETECTED NOT DETECTED Final   Listeria monocytogenes NOT DETECTED NOT DETECTED Final   Staphylococcus species NOT DETECTED NOT DETECTED Final   Staphylococcus aureus (BCID) NOT DETECTED NOT DETECTED Final   Streptococcus species NOT DETECTED NOT DETECTED Final   Streptococcus agalactiae NOT DETECTED NOT DETECTED Final   Streptococcus pneumoniae NOT DETECTED NOT DETECTED Final   Streptococcus pyogenes NOT DETECTED NOT DETECTED Final   Acinetobacter baumannii NOT DETECTED NOT DETECTED Final   Enterobacteriaceae species DETECTED (A) NOT DETECTED Final    Comment: Enterobacteriaceae represent a large family of gram-negative bacteria, not a single organism. CRITICAL RESULT CALLED TO, READ BACK BY AND VERIFIED WITH: RODNEY GRUBB AT [id number] 11/06/2018.PMF    Enterobacter cloacae complex NOT DETECTED NOT DETECTED Final   Escherichia coli NOT DETECTED NOT DETECTED Final   Klebsiella oxytoca NOT DETECTED NOT DETECTED Final   Klebsiella pneumoniae NOT DETECTED NOT DETECTED Final   Proteus species NOT DETECTED NOT DETECTED Final   Serratia marcescens DETECTED (A) NOT DETECTED Final    Comment: CRITICAL RESULT CALLED TO, READ BACK BY AND VERIFIED WITH: RODNEY GRUBB AT [id number] 11/06/2018.PMF    Carbapenem resistance NOT DETECTED NOT DETECTED Final   Haemophilus influenzae NOT DETECTED NOT DETECTED Final    Neisseria meningitidis NOT DETECTED NOT DETECTED Final   Pseudomonas aeruginosa NOT DETECTED NOT DETECTED Final   Candida albicans NOT DETECTED NOT DETECTED Final   Candida glabrata NOT DETECTED NOT DETECTED Final   Candida krusei NOT DETECTED NOT DETECTED Final   Candida parapsilosis NOT DETECTED NOT DETECTED Final   Candida tropicalis NOT DETECTED NOT DETECTED Final    Comment: Performed at [facility], [address], [place], [place] [address]  Urine culture     Status: Abnormal  Collection Time: 10/24/2018  6:52 PM   Specimen: Urine, Random  Result Value Ref Range Status   Specimen Description   Final    URINE, RANDOM Performed at [facility], [address], [place], [place] [address]    Special Requests   Final    NONE Performed at [facility], [address] [place], [place] [address]    Culture MULTIPLE SPECIES PRESENT, SUGGEST RECOLLECTION (A)  Final   Report Status 11/06/2018 FINAL  Final  Blood culture (routine x 2)     Status: None (Preliminary result)   Collection Time: 10/17/2018 10:49 PM   Specimen: BLOOD  Result Value Ref Range Status   Specimen Description BLOOD BLOOD RIGHT HAND  Final   Special Requests   Final    BOTTLES DRAWN AEROBIC AND ANAEROBIC Blood Culture adequate volume   Culture   Final    NO GROWTH 2 DAYS Performed at [facility], [address] [place], [place] [address]    Report Status PENDING  Incomplete    Procedures and diagnostic studies:  Dg Chest Portable 1 View  Result Date: 10/16/2018 CLINICAL DATA:  Shortness of breath EXAM: PORTABLE CHEST 1 VIEW COMPARISON:  10/16/2018 FINDINGS: No significant change in AP portable examination with elevation of the right hemidiaphragm. No acute appearing airspace opacity. Gross cardiomegaly with left chest pacer defibrillator, aortic atherosclerosis, and right chest port catheter. IMPRESSION: 1. No significant change in AP portable examination with elevation  of the right hemidiaphragm. No acute appearing airspace opacity. 2.  Gross cardiomegaly. Electronically Signed   By: Eddie Candle M.D.   On: 10/26/2018 16:57    Medications:   . Chlorhexidine Gluconate Cloth  6 each Topical Daily   Continuous Infusions:   LOS: 2 days   Marques Ericson  [facility] Pager [phone].   *Please refer to [email or web address], password [email or web address] to get updated schedule on who will round on this patient, as hospitalists switch teams weekly. If 7PM-7AM, please contact night-coverage at [email or web address], password [email or web address] for any overnight needs.  11/07/2018, 1:37 PM

## 2018-11-07 DEATH — deceased

## 2018-11-08 DIAGNOSIS — I509 Heart failure, unspecified: Secondary | ICD-10-CM

## 2018-11-08 LAB — CULTURE, BLOOD (ROUTINE X 2)

## 2018-11-08 LAB — GLUCOSE, CAPILLARY: Glucose-Capillary: <10 mg/dL — CL (ref 70–99)

## 2018-11-10 LAB — CULTURE, BLOOD (ROUTINE X 2)
Culture: NO GROWTH
Special Requests: ADEQUATE

## 2018-11-18 ENCOUNTER — Ambulatory Visit: Payer: Medicare Other | Admitting: Nurse Practitioner

## 2018-11-23 ENCOUNTER — Telehealth: Payer: Self-pay | Admitting: *Deleted

## 2018-11-23 ENCOUNTER — Ambulatory Visit (INDEPENDENT_AMBULATORY_CARE_PROVIDER_SITE_OTHER): Payer: Medicare Other | Admitting: Vascular Surgery

## 2018-11-23 NOTE — Telephone Encounter (Signed)
Received 3rd request from [facility] for patient to have compression wraps. Faxed message back to request termination of requests as patient is deceased.

## 2018-12-07 NOTE — Discharge Summary (Addendum)
Physician Discharge Summary  Julia Gordon [id number] DOB: [birth year] DOA: 10/12/2018  PCP: Ellamae Sia, MD  Admit date: 10/17/2018 Discharge date: 2018-11-21  Discharge disposition: Deceased Cause of death: Sepsis Date of death: 2018/11/21 Time of death: 12:27 PM      Discharge Diagnosis:   Active Problems:   Sepsis [facility])     Hospital Course:   Ms. Julia Gordon was an 84 year old woman with medical history significant for COPD, atrial fibrillation, right leg DVT, PVD, chronic systolic CHF with EF of 25 to 30%, chronic hypoxemic respiratory failure on 2 L of oxygen, chronic leg ulcers, history of breast cancer, hypertension, hyperlipidemia, who was brought to the hospital because of increasing shortness of breath.  She was admitted to the hospital for sepsis likely due to cellulitis of bilateral legs and wound infection on bilateral legs.  She was treated with empiric IV antibiotics.  She was also treated for acute exacerbation of CHF.  Unfortunately, her condition deteriorated.  The patient and her family decided against any aggressive treatment and opted for comfort measures with hospice.  She was placed on comfort care and she subsequently expired on 2018-11-21 at 12:37 PM.       Discharge Exam:   Vitals:   11/07/18 2246 2018-11-21 0733  BP: (!) 70/31 (!) 88/51  Pulse: 65 68  Resp: 18 18  Temp:  98 F (36.7 C)  SpO2:  95%   Vitals:   11/07/18 0500 11/07/18 2246 2018-11-21 0733 11/21/2018 1200  BP:  (!) 70/31 (!) 88/51   Pulse:  65 68   Resp:  18 18   Temp:   98 F (36.7 C)   TempSrc:   Oral   SpO2:   95%   Weight: 78.7 kg   78.7 kg  Height:    5\' 2"  (1.575 m)       The results of significant diagnostics from this hospitalization (including imaging, microbiology, ancillary and laboratory) are listed below for reference.     Procedures and Diagnostic Studies:   Dg Chest Portable 1 View  Result Date: 10/31/2018 CLINICAL DATA:  Shortness of breath  EXAM: PORTABLE CHEST 1 VIEW COMPARISON:  10/16/2018 FINDINGS: No significant change in AP portable examination with elevation of the right hemidiaphragm. No acute appearing airspace opacity. Gross cardiomegaly with left chest pacer defibrillator, aortic atherosclerosis, and right chest port catheter. IMPRESSION: 1. No significant change in AP portable examination with elevation of the right hemidiaphragm. No acute appearing airspace opacity. 2.  Gross cardiomegaly. Electronically Signed   By: Eddie Candle M.D.   On: 10/25/2018 16:57     Labs:   Basic Metabolic Panel: Recent Labs  Lab 10/16/2018 1642 11/06/18 0530  NA 136 138  K 4.8 4.8  CL 100 104  CO2 20* 17*  GLUCOSE 88 48*  BUN 39* 45*  CREATININE 1.56* 1.62*  CALCIUM 8.8* 8.3*   GFR Estimated Creatinine Clearance: 26 mL/min (A) (by C-G formula based on SCr of 1.62 mg/dL (H)). Liver Function Tests: Recent Labs  Lab 10/21/2018 1642  AST 77*  ALT 36  ALKPHOS 84  BILITOT 3.4*  PROT 6.6  ALBUMIN 3.0*   No results for input(s): LIPASE, AMYLASE in the last 168 hours. No results for input(s): AMMONIA in the last 168 hours. Coagulation profile Recent Labs  Lab 10/30/2018 1643  INR 2.1*    CBC: Recent Labs  Lab 10/13/2018 1642  WBC 23.6*  HGB 12.4  HCT 38.2  MCV 94.3  PLT 167  Cardiac Enzymes: No results for input(s): CKTOTAL, CKMB, CKMBINDEX, TROPONINI in the last 168 hours. BNP: Invalid input(s): POCBNP CBG: Recent Labs  Lab 11/06/18 0646 11/06/18 0706 11/06/18 0728 11/06/18 0801 11/06/18 0838  GLUCAP 16* <10* <10* 59* <10*   D-Dimer No results for input(s): DDIMER in the last 72 hours. Hgb A1c No results for input(s): HGBA1C in the last 72 hours. Lipid Profile No results for input(s): CHOL, HDL, LDLCALC, TRIG, CHOLHDL, LDLDIRECT in the last 72 hours. Thyroid function studies No results for input(s): TSH, T4TOTAL, T3FREE, THYROIDAB in the last 72 hours.  Invalid input(s): FREET3 Anemia work up No  results for input(s): VITAMINB12, FOLATE, FERRITIN, TIBC, IRON, RETICCTPCT in the last 72 hours. Microbiology Recent Results (from the past 240 hour(s))  SARS Coronavirus 2 by RT PCR (hospital order, performed in [facility] hospital lab) Nasopharyngeal Nasopharyngeal Swab     Status: None   Collection Time: 11/01/2018  4:43 PM   Specimen: Nasopharyngeal Swab  Result Value Ref Range Status   SARS Coronavirus 2 NEGATIVE NEGATIVE Final    Comment: (NOTE) If result is NEGATIVE SARS-CoV-2 target nucleic acids are NOT DETECTED. The SARS-CoV-2 RNA is generally detectable in upper and lower  respiratory specimens during the acute phase of infection. The lowest  concentration of SARS-CoV-2 viral copies this assay can detect is 250  copies / mL. A negative result does not preclude SARS-CoV-2 infection  and should not be used as the sole basis for treatment or other  patient management decisions.  A negative result may occur with  improper specimen collection / handling, submission of specimen other  than nasopharyngeal swab, presence of viral mutation(s) within the  areas targeted by this assay, and inadequate number of viral copies  (<250 copies / mL). A negative result must be combined with clinical  observations, patient history, and epidemiological information. If result is POSITIVE SARS-CoV-2 target nucleic acids are DETECTED. The SARS-CoV-2 RNA is generally detectable in upper and lower  respiratory specimens dur ing the acute phase of infection.  Positive  results are indicative of active infection with SARS-CoV-2.  Clinical  correlation with patient history and other diagnostic information is  necessary to determine patient infection status.  Positive results do  not rule out bacterial infection or co-infection with other viruses. If result is PRESUMPTIVE POSTIVE SARS-CoV-2 nucleic acids MAY BE PRESENT.   A presumptive positive result was obtained on the submitted specimen  and  confirmed on repeat testing.  While 2019 novel coronavirus  (SARS-CoV-2) nucleic acids may be present in the submitted sample  additional confirmatory testing may be necessary for epidemiological  and / or clinical management purposes  to differentiate between  SARS-CoV-2 and other Sarbecovirus currently known to infect humans.  If clinically indicated additional testing with an alternate test  methodology [phone]) is advised. The SARS-CoV-2 RNA is generally  detectable in upper and lower respiratory sp ecimens during the acute  phase of infection. The expected result is Negative. Fact Sheet for Patients:  StrictlyIdeas.no Fact Sheet for Healthcare Providers: BankingDealers.co.za This test is not yet approved or cleared by the [place] FDA and has been authorized for detection and/or diagnosis of SARS-CoV-2 by FDA under an Emergency Use Authorization (EUA).  This EUA will remain in effect (meaning this test can be used) for the duration of the COVID-19 declaration under Section 564(b)(1) of the Act, 21 U.S.C. section 360bbb-3(b)(1), unless the authorization is terminated or revoked sooner. Performed at [facility], [address] [place], [place] [address]   Blood culture (routine x 2)     Status: Abnormal   Collection Time: 11/01/2018  4:46 PM   Specimen: BLOOD  Result Value Ref Range Status   Specimen Description   Final    BLOOD RIGHT PORTA CATH Performed at [facility], [place] [address], [place], [place] [address]    Special Requests   Final    BOTTLES DRAWN AEROBIC AND ANAEROBIC Blood Culture results may not be optimal due to an excessive volume of blood received in culture bottles Performed at [facility], [address] [place], [place] [address]    Culture  Setup Time   Final    GRAM NEGATIVE RODS AEROBIC BOTTLE ONLY CRITICAL RESULT CALLED TO, READ BACK BY AND VERIFIED WITH: RODNEY GRUBB AT  UG:[id number] 11/06/2018.PMF Performed at [facility], [place] [address] [place], [place] [address]    Culture SERRATIA MARCESCENS (A)  Final   Report Status 16-Nov-2018 FINAL  Final   Organism ID, Bacteria SERRATIA MARCESCENS  Final      Susceptibility   Serratia marcescens - MIC*    CEFAZOLIN >=64 RESISTANT Resistant     CEFEPIME <=1 SENSITIVE Sensitive     CEFTAZIDIME <=1 SENSITIVE Sensitive     CEFTRIAXONE <=1 SENSITIVE Sensitive     CIPROFLOXACIN <=0.25 SENSITIVE Sensitive     GENTAMICIN <=1 SENSITIVE Sensitive     TRIMETH/SULFA <=20 SENSITIVE Sensitive     * SERRATIA MARCESCENS  Blood Culture ID Panel (Reflexed)     Status: Abnormal   Collection Time: 10/31/2018  4:46 PM  Result Value Ref Range Status   Enterococcus species NOT DETECTED NOT DETECTED Final   Listeria monocytogenes NOT DETECTED NOT DETECTED Final   Staphylococcus species NOT DETECTED NOT DETECTED Final   Staphylococcus aureus (BCID) NOT DETECTED NOT DETECTED Final   Streptococcus species NOT DETECTED NOT DETECTED Final   Streptococcus agalactiae NOT DETECTED NOT DETECTED Final   Streptococcus pneumoniae NOT DETECTED NOT DETECTED Final   Streptococcus pyogenes NOT DETECTED NOT DETECTED Final   Acinetobacter baumannii NOT DETECTED NOT DETECTED Final   Enterobacteriaceae species DETECTED (A) NOT DETECTED Final    Comment: Enterobacteriaceae represent a large family of gram-negative bacteria, not a single organism. CRITICAL RESULT CALLED TO, READ BACK BY AND VERIFIED WITH: RODNEY GRUBB AT UG:[id number] 11/06/2018.PMF    Enterobacter cloacae complex NOT DETECTED NOT DETECTED Final   Escherichia coli NOT DETECTED NOT DETECTED Final   Klebsiella oxytoca NOT DETECTED NOT DETECTED Final   Klebsiella pneumoniae NOT DETECTED NOT DETECTED Final   Proteus species NOT DETECTED NOT DETECTED Final   Serratia marcescens DETECTED (A) NOT DETECTED Final    Comment: CRITICAL RESULT CALLED TO, READ BACK BY AND VERIFIED WITH: RODNEY GRUBB AT  [id number] 11/06/2018.PMF    Carbapenem resistance NOT DETECTED NOT DETECTED Final   Haemophilus influenzae NOT DETECTED NOT DETECTED Final   Neisseria meningitidis NOT DETECTED NOT DETECTED Final   Pseudomonas aeruginosa NOT DETECTED NOT DETECTED Final   Candida albicans NOT DETECTED NOT DETECTED Final   Candida glabrata NOT DETECTED NOT DETECTED Final   Candida krusei NOT DETECTED NOT DETECTED Final   Candida parapsilosis NOT DETECTED NOT DETECTED Final   Candida tropicalis NOT DETECTED NOT DETECTED Final    Comment: Performed at [facility], [address] [place], [place] [address]  Urine culture     Status: Abnormal   Collection Time: 10/18/2018  6:52 PM   Specimen: Urine, Random  Result Value Ref Range Status   Specimen Description   Final    URINE, RANDOM Performed at [facility], [address] [place], [place] [address]    Special Requests   Final    NONE Performed at [facility], [address] [place], [place] [address]    Culture MULTIPLE SPECIES PRESENT, SUGGEST RECOLLECTION (A)  Final   Report Status 11/06/2018 FINAL  Final  Blood culture (routine x 2)     Status: None (Preliminary result)   Collection Time: 10/09/2018 10:49 PM   Specimen: BLOOD  Result Value Ref Range Status   Specimen Description BLOOD BLOOD RIGHT HAND  Final   Special Requests   Final    BOTTLES DRAWN AEROBIC AND ANAEROBIC Blood Culture adequate volume   Culture   Final    NO GROWTH 3 DAYS Performed at [facility], [address] [place], [place] [address]    Report Status PENDING  Incomplete     Discharge Instructions:    Allergies as of November 18, 2018      Reactions   No Known Allergies       Medication List    ASK your doctor about these medications   acetaminophen 325 MG tablet Commonly known as: TYLENOL Take 2 tablets (650 mg total) by mouth every 6 (six) hours as needed for mild pain (or temp > 37.5 C (99.5 F)).   alendronate 70 MG tablet  Commonly known as: FOSAMAX TAKE 1 TAB WEEKLY 30 MINS BEFORE BREAKFAST WITH A FULL GLASS OF WATER. DO NOT LIE DOWN FOR 30 MINS   aspirin 81 MG EC tablet Take 1 tablet (81 mg total) by mouth daily.   atorvastatin 40 MG tablet Commonly known as: LIPITOR Take 1 tablet (40 mg total) by mouth daily at 6 PM.   furosemide 20 MG tablet Commonly known as: LASIX Take 40 mg (2 tablets) by mouth every morning, take 1 tablet (20 mg total) once in the afternoon.   isosorbide mononitrate 30 MG 24 hr tablet Commonly known as: IMDUR Take 30 mg by mouth daily.   metoprolol succinate 25 MG 24 hr tablet Commonly known as: Toprol XL Take 3 tablets (75 mg total) by mouth daily. Take with or immediately following a meal.   potassium chloride SA 20 MEQ tablet Commonly known as: KLOR-CON Take 1 tablet (20 mEq total) by mouth daily.   Rivaroxaban 15 MG Tabs tablet Commonly known as: XARELTO Take 1 tablet (15 mg total) by mouth daily with supper.   sacubitril-valsartan 24-26 MG Commonly known as: ENTRESTO Take 1 tablet by mouth 2 (two) times daily.   senna-docusate 8.6-50 MG tablet Commonly known as: Senokot-S Take 1 tablet by mouth at bedtime as needed for mild constipation or moderate constipation.   traMADol 50 MG tablet Commonly known as: Ultram Take 1 tablet (50 mg total) by mouth every 6 (six) hours as needed.         Time coordinating discharge: 28 minutes  Signed:  Jennye Boroughs  Pager [phone] [facility] 11/18/2018, 8:32 PM

## 2018-12-07 NOTE — Progress Notes (Signed)
Patient passed away at 12:27pm. RN pronounced. Family, physician, and [facility] notified. [place] donor services called and notified. Reference number is [id number], patient is not a candidate for organ donation. Family came to visit with patient, funeral home release form filled out, [facility] notified, patient to be transported to [facility].

## 2018-12-07 NOTE — Death Summary Note (Addendum)
Death Summary      DARSHAY CHIRDON [id number] DOB: July 15, 1934 DOA: November 24, 2018  PCP: Ellamae Sia, MD   Admit date: 11-24-18 Date of Death: December 06, 2018  Cause of Death: Sepsis   Final Diagnoses:   Principal problem: Sepsis  Active Problems:   Sepsis [facility])      Hospital Course:   Ms. Muramoto was an 84 year old woman with medical history significant for COPD, atrial fibrillation, right leg DVT, PVD, chronic systolic CHF with EF of 25 to 30%, chronic hypoxemic respiratory failure on 2 L of oxygen, chronic leg ulcers, history of breast cancer, hypertension, hyperlipidemia, who was brought to the hospital because of increasing shortness of breath.  She was admitted to the hospital for sepsis likely due to cellulitis of bilateral legs and wound infection on bilateral legs.  She was treated with empiric IV antibiotics.  She was also treated for acute exacerbation of CHF.  Unfortunately, her condition deteriorated.  The patient and her family decided against any aggressive treatment and opted for comfort measures with hospice.  She was placed on comfort care and she subsequently expired on 11/27/18 at 12:27 PM.  Time of death: 12:27 pm  Signed:  Jennye Boroughs  [facility] 12/06/2018, 9:34 PM

## 2018-12-07 NOTE — Care Management Important Message (Signed)
Important Message  Patient Details  Name: TAMZIN WASER MRN: [id number] Date of Birth: [birth year]   Medicare Important Message Given:  Yes     Dannette Barbara 12-03-18, 11:35 AM

## 2018-12-07 DEATH — deceased

## 2019-03-24 ENCOUNTER — Ambulatory Visit: Payer: Medicare Other | Admitting: Oncology

## 2020-08-30 IMAGING — DX DG CHEST 1V PORT
1 series · 1 of 1 positions shown · non-contrast
Comparison: 09/25/2018

CLINICAL DATA: Sepsis.

EXAM:
PORTABLE CHEST 1 VIEW

[chest ap]
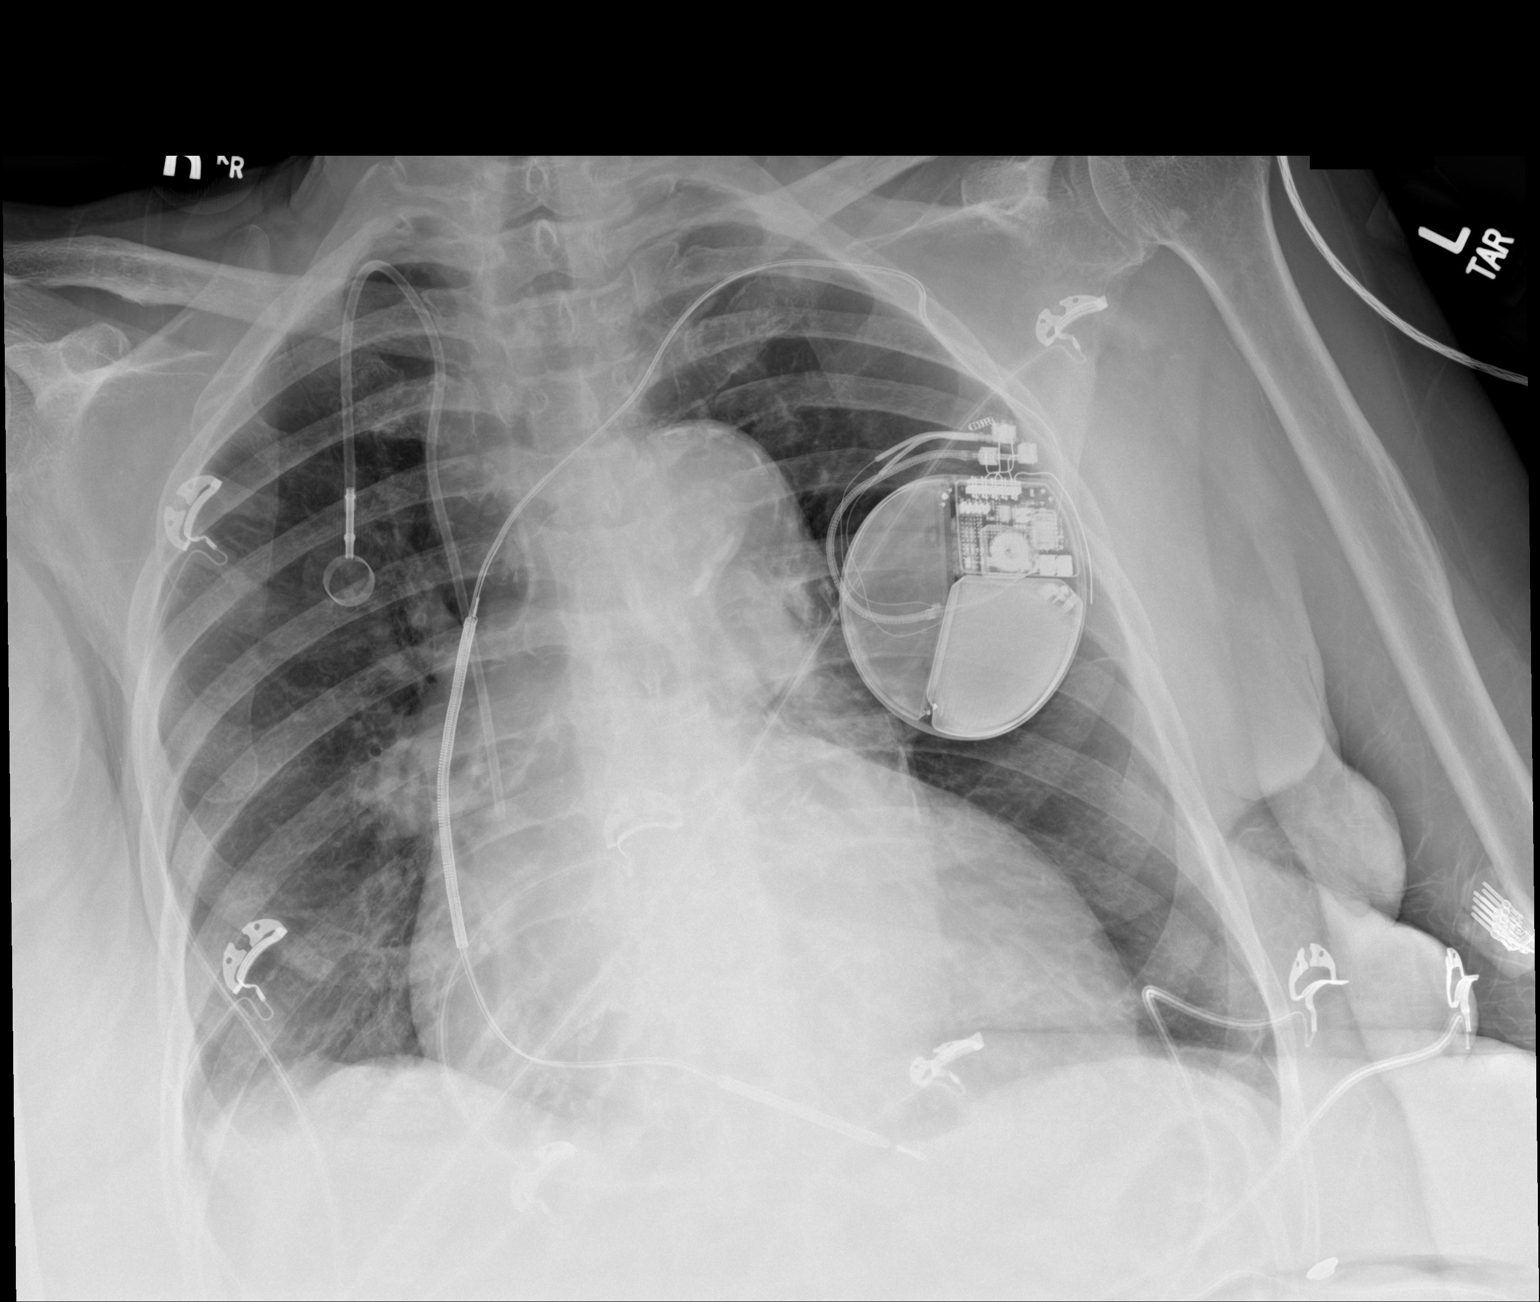

[1 of 1 positions shown; findings below may reference images not displayed]

FINDINGS: The heart is enlarged but stable. There is moderate tortuosity,
ectasia and calcification of the thoracic aorta.

The pacer wires/AICD is stable. The right IJ power port is stable.
The lungs are clear of an acute process. Minimal right basilar
scarring or atelectasis. The bony thorax is intact.
IMPRESSION: 1. Stable cardiac enlargement and tortuous ectatic calcified
thoracic aorta.
2. Streaky right basilar scarring or atelectasis but no infiltrates
or effusions.

## 2020-08-30 IMAGING — CT CT HEAD W/O CM
3 series · 16 of 47 positions shown, 19 images · non-contrast
Comparison: October 04, 2006

CLINICAL DATA: Altered mental status.  Patient on Xarelto.

EXAM:
CT HEAD WITHOUT CONTRAST
TECHNIQUE: Contiguous axial images were obtained from the base of the skull
through the vertex without intravenous contrast.

[Series 2: head wo · axial · 0.39mm/px · z∈[+195,+320]mm · 10 of 31 slices shown, 13 images]
[im 3/31  brain]
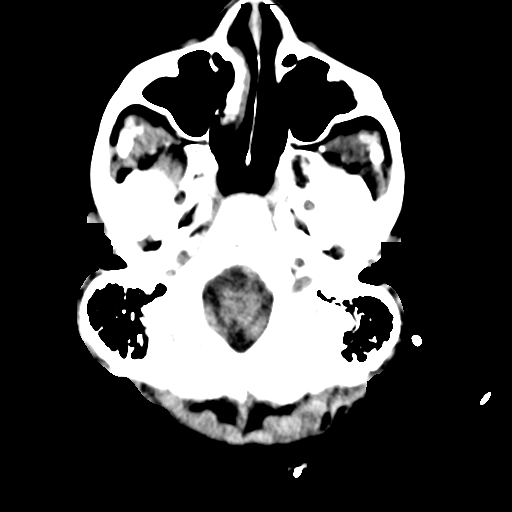
[im 3/31  bone]
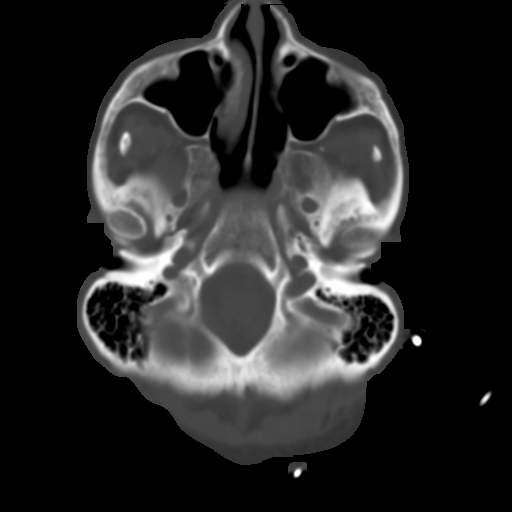
[im 6/31  brain]
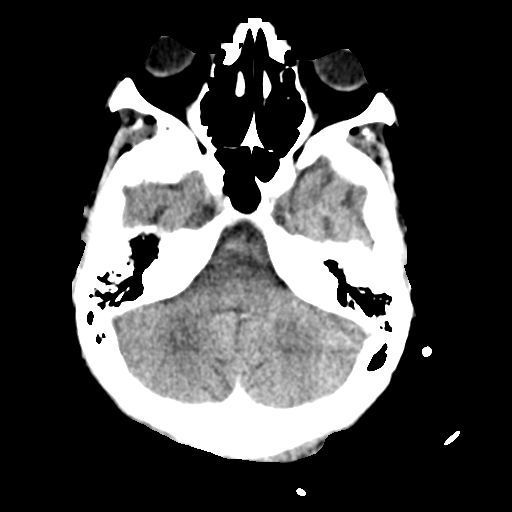
[im 9/31  brain]
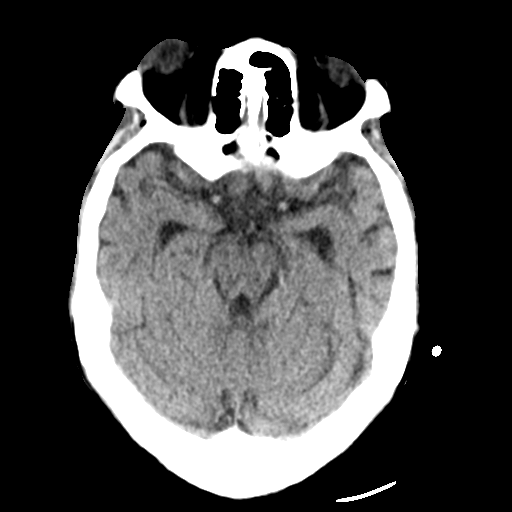
[im 11/31  brain]
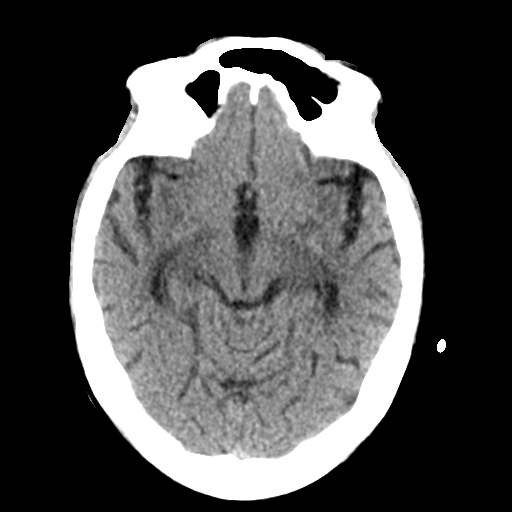
[im 14/31  brain]
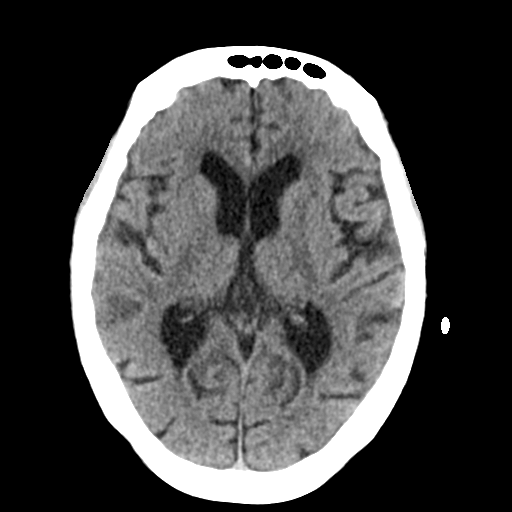
[im 14/31  bone]
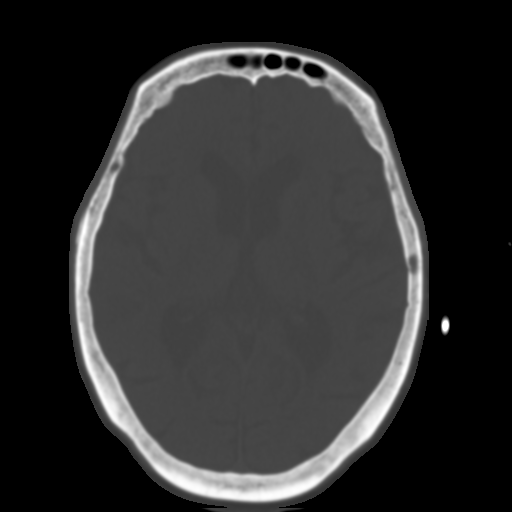
[im 17/31  brain]
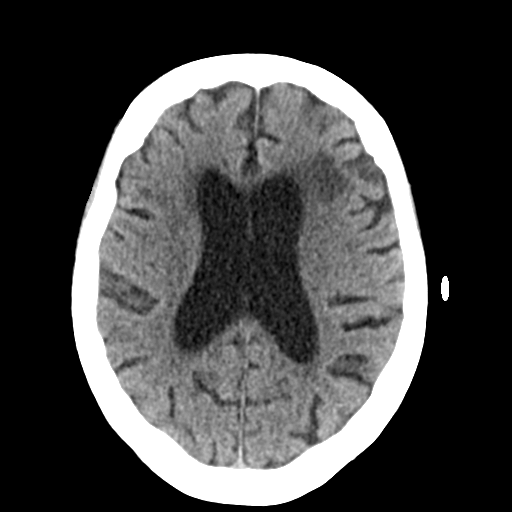
[im 20/31  brain]
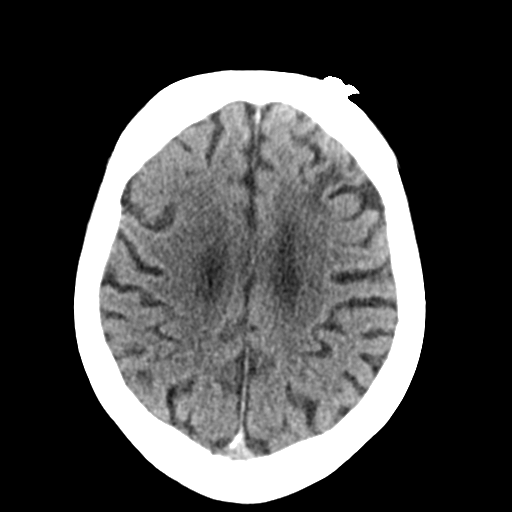
[im 23/31  brain]
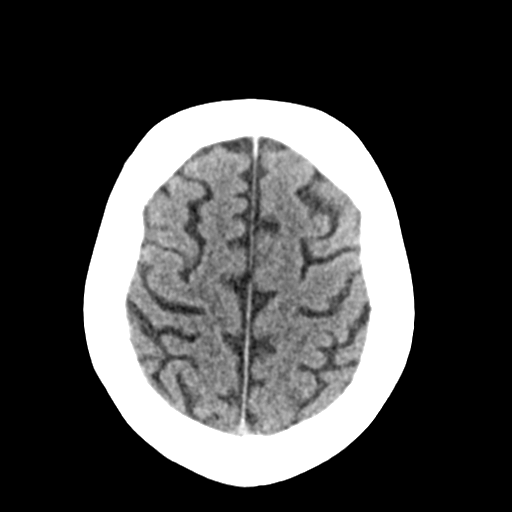
[im 25/31  brain]
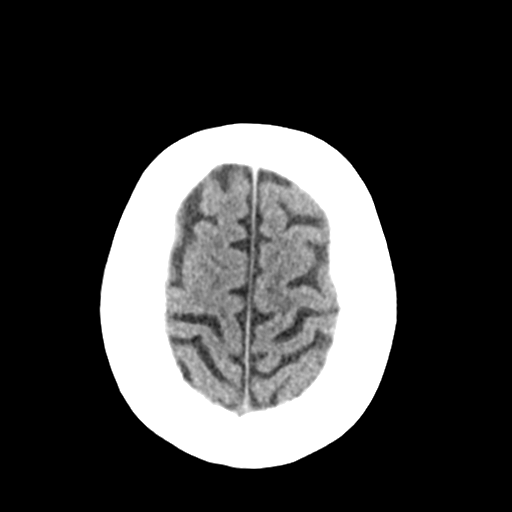
[im 25/31  bone]
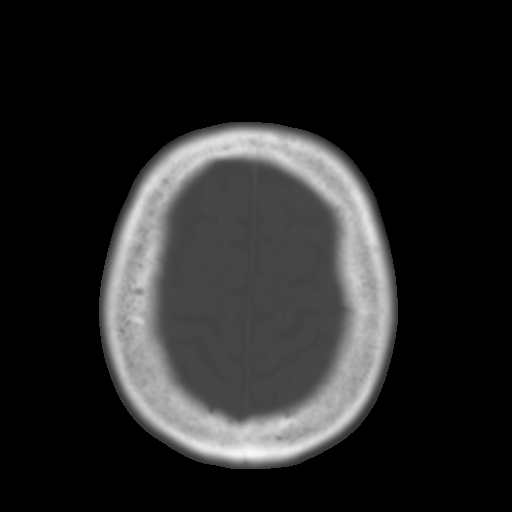
[im 28/31  brain]
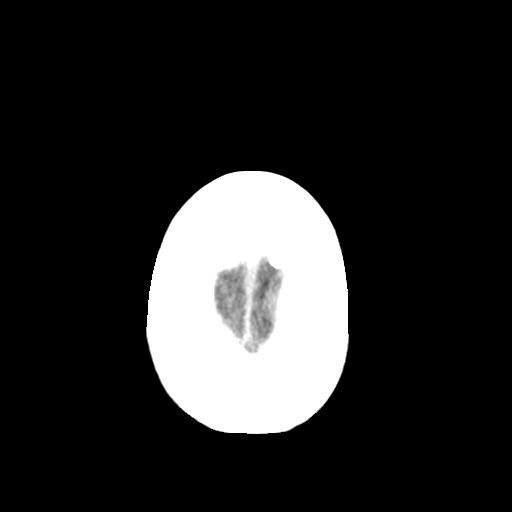

[Series 4: coronal soft tissue · coronal · 0.31mm/px · 3 of 67 slices shown]
[im 25/67  brain]
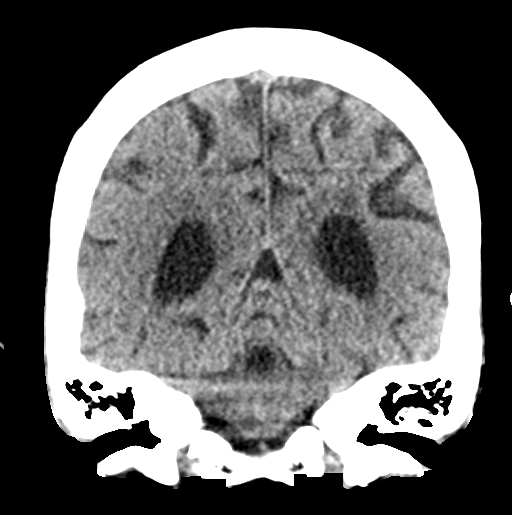
[im 31/67  brain]
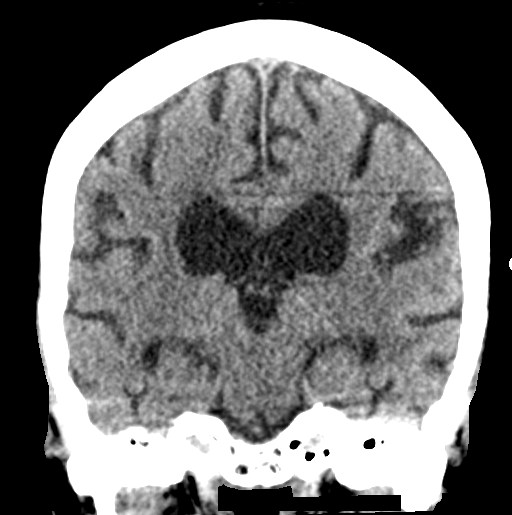
[im 37/67  brain]
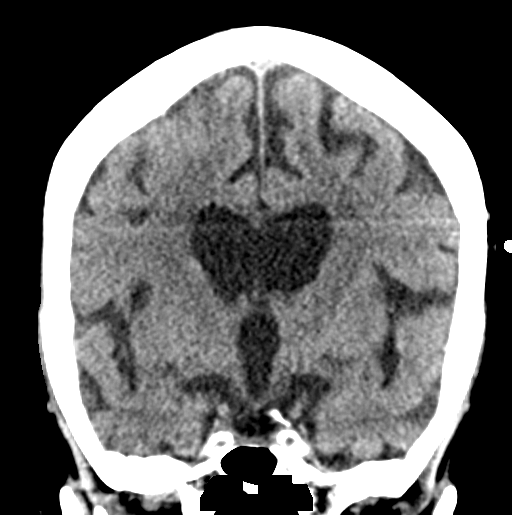

[Series 5: sagittal soft tissue · sagittal · 0.31mm/px · 3 of 53 slices shown]
[im 18/53  brain]
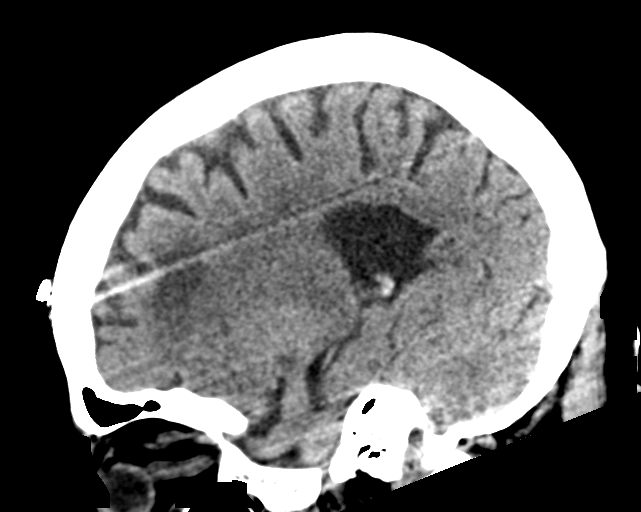
[im 27/53  brain]
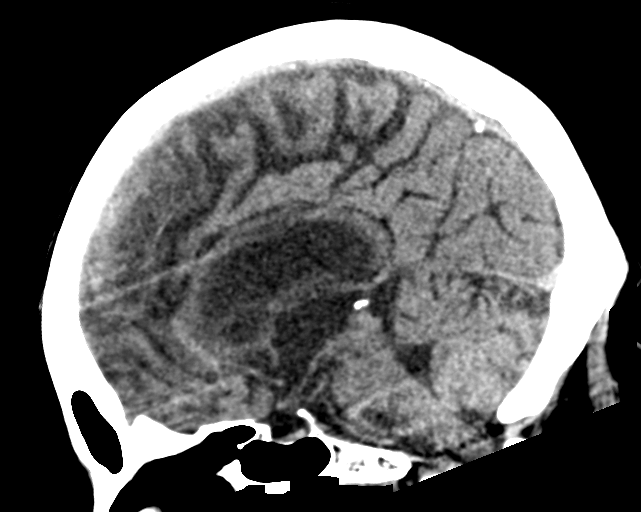
[im 35/53  brain]
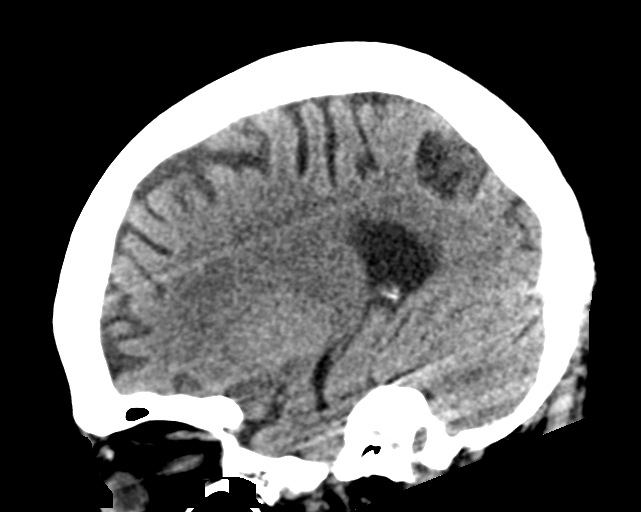

[16 of 47 positions shown; findings below may reference images not displayed]

FINDINGS: Brain: No subdural, epidural, or subarachnoid hemorrhage identified.
Ventricles and sulci are prominent but stable. Cerebellum,
brainstem, and basal cisterns are within normal limits. No mass
effect or midline shift. White matter changes are identified,
particularly in the left frontal lobe such as on axial image 18.
There appears to be mild involvement over the overlying cortex
suggesting a small left frontal infarct. No other evidence of acute
ischemia or infarct identified.

Vascular: Calcified atherosclerosis is seen in the intracranial
carotids.

Skull: Normal. Negative for fracture or focal lesion.

Sinuses/Orbits: No acute finding.

Other: None.
IMPRESSION: 1. Focal white matter changes are seen in the left frontal lobe with
a small region of overlying cortical involvement consistent with
infarct. These findings are age indeterminate but favored to be
nonacute based on appearance. Recommend clinical correlation
2. No other acute abnormalities are identified.
